# Patient Record
Sex: Female | Born: 1935 | Race: White | Hispanic: No | Smoking: Never smoker
Health system: Southern US, Community
[De-identification: ages and names within clinical notes are randomized; demographics above are authoritative.]

## PROBLEM LIST (undated history)

## (undated) ENCOUNTER — Emergency Department (HOSPITAL_COMMUNITY): Discharge status: Against Medical Advice | Payer: Medicare Other

## (undated) DIAGNOSIS — D472 Monoclonal gammopathy: Secondary | ICD-10-CM

## (undated) DIAGNOSIS — E042 Nontoxic multinodular goiter: Secondary | ICD-10-CM

## (undated) DIAGNOSIS — R42 Dizziness and giddiness: Secondary | ICD-10-CM

## (undated) DIAGNOSIS — I5030 Unspecified diastolic (congestive) heart failure: Secondary | ICD-10-CM

## (undated) DIAGNOSIS — M810 Age-related osteoporosis without current pathological fracture: Secondary | ICD-10-CM

## (undated) DIAGNOSIS — E119 Type 2 diabetes mellitus without complications: Secondary | ICD-10-CM

## (undated) DIAGNOSIS — D649 Anemia, unspecified: Secondary | ICD-10-CM

## (undated) DIAGNOSIS — I4891 Unspecified atrial fibrillation: Secondary | ICD-10-CM

## (undated) DIAGNOSIS — I10 Essential (primary) hypertension: Secondary | ICD-10-CM

## (undated) DIAGNOSIS — I272 Pulmonary hypertension, unspecified: Secondary | ICD-10-CM

## (undated) DIAGNOSIS — K5790 Diverticulosis of intestine, part unspecified, without perforation or abscess without bleeding: Secondary | ICD-10-CM

## (undated) DIAGNOSIS — L989 Disorder of the skin and subcutaneous tissue, unspecified: Secondary | ICD-10-CM

## (undated) DIAGNOSIS — I340 Nonrheumatic mitral (valve) insufficiency: Secondary | ICD-10-CM

## (undated) DIAGNOSIS — N184 Chronic kidney disease, stage 4 (severe): Secondary | ICD-10-CM

## (undated) DIAGNOSIS — J449 Chronic obstructive pulmonary disease, unspecified: Secondary | ICD-10-CM

## (undated) DIAGNOSIS — IMO0002 Reserved for concepts with insufficient information to code with codable children: Secondary | ICD-10-CM

## (undated) DIAGNOSIS — I350 Nonrheumatic aortic (valve) stenosis: Secondary | ICD-10-CM

## (undated) DIAGNOSIS — C50919 Malignant neoplasm of unspecified site of unspecified female breast: Secondary | ICD-10-CM

## (undated) DIAGNOSIS — I639 Cerebral infarction, unspecified: Secondary | ICD-10-CM

## (undated) HISTORY — PX: HEMORRHOID SURGERY: SHX153

## (undated) HISTORY — PX: OTHER SURGICAL HISTORY: SHX169

## (undated) HISTORY — DX: Malignant neoplasm of unspecified site of unspecified female breast: C50.919

## (undated) HISTORY — PX: COLONOSCOPY: SHX174

## (undated) HISTORY — PX: CHOLECYSTECTOMY: SHX55

## (undated) HISTORY — PX: CATARACT EXTRACTION, BILATERAL: SHX1313

---

## 1989-04-29 HISTORY — PX: ABDOMINAL HYSTERECTOMY: SHX81

## 2000-09-30 ENCOUNTER — Encounter: Payer: Self-pay | Admitting: Family Medicine

## 2000-09-30 ENCOUNTER — Ambulatory Visit (HOSPITAL_COMMUNITY): Admission: RE | Admit: 2000-09-30 | Discharge: 2000-09-30 | Payer: Self-pay | Admitting: Family Medicine

## 2001-01-06 ENCOUNTER — Encounter: Payer: Self-pay | Admitting: Emergency Medicine

## 2001-01-06 ENCOUNTER — Emergency Department (HOSPITAL_COMMUNITY): Admission: EM | Admit: 2001-01-06 | Discharge: 2001-01-06 | Payer: Self-pay | Admitting: Emergency Medicine

## 2001-02-12 ENCOUNTER — Encounter: Payer: Self-pay | Admitting: Family Medicine

## 2001-02-12 ENCOUNTER — Ambulatory Visit (HOSPITAL_COMMUNITY): Admission: RE | Admit: 2001-02-12 | Discharge: 2001-02-12 | Payer: Self-pay | Admitting: Family Medicine

## 2001-03-03 ENCOUNTER — Ambulatory Visit (HOSPITAL_COMMUNITY): Admission: RE | Admit: 2001-03-03 | Discharge: 2001-03-03 | Payer: Self-pay | Admitting: Ophthalmology

## 2001-07-30 ENCOUNTER — Ambulatory Visit (HOSPITAL_COMMUNITY): Admission: RE | Admit: 2001-07-30 | Discharge: 2001-07-30 | Payer: Self-pay | Admitting: Family Medicine

## 2001-07-30 ENCOUNTER — Encounter: Payer: Self-pay | Admitting: Family Medicine

## 2001-08-28 ENCOUNTER — Ambulatory Visit (HOSPITAL_COMMUNITY): Admission: RE | Admit: 2001-08-28 | Discharge: 2001-08-28 | Payer: Self-pay | Admitting: Family Medicine

## 2001-08-28 ENCOUNTER — Encounter: Payer: Self-pay | Admitting: Family Medicine

## 2001-08-31 ENCOUNTER — Ambulatory Visit (HOSPITAL_COMMUNITY): Admission: RE | Admit: 2001-08-31 | Discharge: 2001-08-31 | Payer: Self-pay | Admitting: Family Medicine

## 2001-09-10 ENCOUNTER — Ambulatory Visit (HOSPITAL_COMMUNITY): Admission: RE | Admit: 2001-09-10 | Discharge: 2001-09-10 | Payer: Self-pay | Admitting: Family Medicine

## 2001-11-27 ENCOUNTER — Encounter: Payer: Self-pay | Admitting: Emergency Medicine

## 2001-11-27 ENCOUNTER — Emergency Department (HOSPITAL_COMMUNITY): Admission: EM | Admit: 2001-11-27 | Discharge: 2001-11-27 | Payer: Self-pay | Admitting: Emergency Medicine

## 2002-03-15 ENCOUNTER — Ambulatory Visit (HOSPITAL_COMMUNITY): Admission: RE | Admit: 2002-03-15 | Discharge: 2002-03-15 | Payer: Self-pay | Admitting: Ophthalmology

## 2002-07-22 ENCOUNTER — Ambulatory Visit (HOSPITAL_COMMUNITY): Admission: RE | Admit: 2002-07-22 | Discharge: 2002-07-22 | Payer: Self-pay | Admitting: Ophthalmology

## 2003-03-25 ENCOUNTER — Ambulatory Visit (HOSPITAL_COMMUNITY): Admission: RE | Admit: 2003-03-25 | Discharge: 2003-03-25 | Payer: Self-pay | Admitting: Family Medicine

## 2003-04-30 HISTORY — PX: MASTECTOMY: SHX3

## 2003-08-17 ENCOUNTER — Ambulatory Visit (HOSPITAL_COMMUNITY): Admission: RE | Admit: 2003-08-17 | Discharge: 2003-08-17 | Payer: Self-pay | Admitting: Family Medicine

## 2003-08-19 ENCOUNTER — Ambulatory Visit (HOSPITAL_COMMUNITY): Admission: RE | Admit: 2003-08-19 | Discharge: 2003-08-19 | Payer: Self-pay | Admitting: General Surgery

## 2003-08-26 ENCOUNTER — Inpatient Hospital Stay (HOSPITAL_COMMUNITY): Admission: RE | Admit: 2003-08-26 | Discharge: 2003-08-29 | Payer: Self-pay | Admitting: General Surgery

## 2003-09-02 ENCOUNTER — Encounter: Admission: RE | Admit: 2003-09-02 | Discharge: 2003-09-02 | Payer: Self-pay | Admitting: Oncology

## 2003-09-02 ENCOUNTER — Encounter (HOSPITAL_COMMUNITY): Admission: RE | Admit: 2003-09-02 | Discharge: 2003-10-02 | Payer: Self-pay | Admitting: Oncology

## 2004-03-28 ENCOUNTER — Encounter (HOSPITAL_COMMUNITY): Admission: RE | Admit: 2004-03-28 | Discharge: 2004-04-27 | Payer: Self-pay | Admitting: Oncology

## 2004-03-28 ENCOUNTER — Ambulatory Visit (HOSPITAL_COMMUNITY): Payer: Self-pay | Admitting: Oncology

## 2004-03-28 ENCOUNTER — Encounter: Admission: RE | Admit: 2004-03-28 | Discharge: 2004-04-27 | Payer: Self-pay | Admitting: Oncology

## 2004-04-02 ENCOUNTER — Ambulatory Visit (HOSPITAL_COMMUNITY): Admission: RE | Admit: 2004-04-02 | Discharge: 2004-04-02 | Payer: Self-pay | Admitting: General Surgery

## 2004-04-29 HISTORY — PX: HIP FRACTURE SURGERY: SHX118

## 2004-09-21 ENCOUNTER — Ambulatory Visit (HOSPITAL_COMMUNITY): Payer: Self-pay | Admitting: Oncology

## 2004-09-21 ENCOUNTER — Encounter: Admission: RE | Admit: 2004-09-21 | Discharge: 2004-09-21 | Payer: Self-pay | Admitting: Oncology

## 2004-09-21 ENCOUNTER — Encounter (HOSPITAL_COMMUNITY): Admission: RE | Admit: 2004-09-21 | Discharge: 2004-10-21 | Payer: Self-pay | Admitting: Oncology

## 2004-12-02 ENCOUNTER — Inpatient Hospital Stay (HOSPITAL_COMMUNITY): Admission: EM | Admit: 2004-12-02 | Discharge: 2004-12-11 | Payer: Self-pay | Admitting: *Deleted

## 2004-12-03 ENCOUNTER — Encounter (INDEPENDENT_AMBULATORY_CARE_PROVIDER_SITE_OTHER): Payer: Self-pay | Admitting: Orthopaedic Surgery

## 2005-01-28 ENCOUNTER — Ambulatory Visit (HOSPITAL_COMMUNITY): Admission: RE | Admit: 2005-01-28 | Discharge: 2005-01-28 | Payer: Self-pay | Admitting: Family Medicine

## 2005-02-20 ENCOUNTER — Emergency Department (HOSPITAL_COMMUNITY): Admission: EM | Admit: 2005-02-20 | Discharge: 2005-02-20 | Payer: Self-pay | Admitting: Emergency Medicine

## 2005-03-25 ENCOUNTER — Ambulatory Visit (HOSPITAL_COMMUNITY): Payer: Self-pay | Admitting: Oncology

## 2005-03-25 ENCOUNTER — Encounter (HOSPITAL_COMMUNITY): Admission: RE | Admit: 2005-03-25 | Discharge: 2005-04-24 | Payer: Self-pay | Admitting: Oncology

## 2005-03-25 ENCOUNTER — Encounter: Admission: RE | Admit: 2005-03-25 | Discharge: 2005-03-25 | Payer: Self-pay | Admitting: Oncology

## 2005-04-01 ENCOUNTER — Ambulatory Visit (HOSPITAL_COMMUNITY): Admission: RE | Admit: 2005-04-01 | Discharge: 2005-04-01 | Payer: Self-pay | Admitting: Ophthalmology

## 2005-09-17 ENCOUNTER — Emergency Department (HOSPITAL_COMMUNITY): Admission: EM | Admit: 2005-09-17 | Discharge: 2005-09-17 | Payer: Self-pay | Admitting: Emergency Medicine

## 2005-10-14 ENCOUNTER — Ambulatory Visit (HOSPITAL_COMMUNITY): Admission: RE | Admit: 2005-10-14 | Discharge: 2005-10-14 | Payer: Self-pay | Admitting: Family Medicine

## 2006-03-07 ENCOUNTER — Ambulatory Visit (HOSPITAL_COMMUNITY): Admission: RE | Admit: 2006-03-07 | Discharge: 2006-03-07 | Payer: Self-pay | Admitting: Family Medicine

## 2006-08-06 ENCOUNTER — Ambulatory Visit (HOSPITAL_COMMUNITY): Admission: RE | Admit: 2006-08-06 | Discharge: 2006-08-06 | Payer: Self-pay | Admitting: Family Medicine

## 2006-08-07 ENCOUNTER — Ambulatory Visit (HOSPITAL_COMMUNITY): Admission: RE | Admit: 2006-08-07 | Discharge: 2006-08-07 | Payer: Self-pay | Admitting: Family Medicine

## 2006-09-27 ENCOUNTER — Emergency Department (HOSPITAL_COMMUNITY): Admission: EM | Admit: 2006-09-27 | Discharge: 2006-09-27 | Payer: Self-pay | Admitting: Emergency Medicine

## 2006-10-07 ENCOUNTER — Ambulatory Visit (HOSPITAL_COMMUNITY): Admission: RE | Admit: 2006-10-07 | Discharge: 2006-10-07 | Payer: Self-pay | Admitting: Family Medicine

## 2007-08-13 ENCOUNTER — Ambulatory Visit (HOSPITAL_COMMUNITY): Admission: RE | Admit: 2007-08-13 | Discharge: 2007-08-13 | Payer: Self-pay | Admitting: Family Medicine

## 2007-12-20 ENCOUNTER — Emergency Department (HOSPITAL_COMMUNITY): Admission: EM | Admit: 2007-12-20 | Discharge: 2007-12-20 | Payer: Self-pay | Admitting: Emergency Medicine

## 2008-04-29 HISTORY — PX: HIP FRACTURE SURGERY: SHX118

## 2008-05-12 ENCOUNTER — Ambulatory Visit: Payer: Self-pay | Admitting: Cardiology

## 2008-05-12 ENCOUNTER — Inpatient Hospital Stay (HOSPITAL_COMMUNITY): Admission: EM | Admit: 2008-05-12 | Discharge: 2008-05-17 | Payer: Self-pay | Admitting: Emergency Medicine

## 2008-05-16 ENCOUNTER — Encounter (INDEPENDENT_AMBULATORY_CARE_PROVIDER_SITE_OTHER): Payer: Self-pay | Admitting: Internal Medicine

## 2008-05-17 ENCOUNTER — Inpatient Hospital Stay: Admission: RE | Admit: 2008-05-17 | Discharge: 2008-06-16 | Payer: Self-pay | Admitting: Internal Medicine

## 2008-06-13 ENCOUNTER — Ambulatory Visit (HOSPITAL_COMMUNITY): Admission: RE | Admit: 2008-06-13 | Discharge: 2008-06-13 | Payer: Self-pay | Admitting: Internal Medicine

## 2008-09-12 ENCOUNTER — Ambulatory Visit (HOSPITAL_COMMUNITY): Admission: RE | Admit: 2008-09-12 | Discharge: 2008-09-12 | Payer: Self-pay | Admitting: Ophthalmology

## 2008-11-09 ENCOUNTER — Ambulatory Visit (HOSPITAL_COMMUNITY): Admission: RE | Admit: 2008-11-09 | Discharge: 2008-11-09 | Payer: Self-pay | Admitting: Orthopaedic Surgery

## 2008-12-27 ENCOUNTER — Ambulatory Visit (HOSPITAL_COMMUNITY): Admission: RE | Admit: 2008-12-27 | Discharge: 2008-12-27 | Payer: Self-pay | Admitting: Family Medicine

## 2009-10-16 ENCOUNTER — Encounter (HOSPITAL_COMMUNITY): Admission: RE | Admit: 2009-10-16 | Discharge: 2009-11-15 | Payer: Self-pay | Admitting: Family Medicine

## 2009-11-16 ENCOUNTER — Encounter (HOSPITAL_COMMUNITY): Admission: RE | Admit: 2009-11-16 | Discharge: 2009-12-16 | Payer: Self-pay | Admitting: Family Medicine

## 2009-12-16 ENCOUNTER — Ambulatory Visit: Payer: Self-pay | Admitting: Cardiovascular Disease

## 2009-12-18 ENCOUNTER — Encounter (INDEPENDENT_AMBULATORY_CARE_PROVIDER_SITE_OTHER): Payer: Self-pay | Admitting: Internal Medicine

## 2010-01-24 ENCOUNTER — Inpatient Hospital Stay (HOSPITAL_COMMUNITY): Admission: EM | Admit: 2010-01-24 | Discharge: 2010-01-27 | Payer: Self-pay | Admitting: Emergency Medicine

## 2010-01-24 ENCOUNTER — Ambulatory Visit: Payer: Self-pay | Admitting: Cardiology

## 2010-04-05 ENCOUNTER — Inpatient Hospital Stay (HOSPITAL_COMMUNITY): Admission: EM | Admit: 2010-04-05 | Discharge: 2009-12-22 | Payer: Self-pay | Admitting: Emergency Medicine

## 2010-07-10 ENCOUNTER — Other Ambulatory Visit (HOSPITAL_COMMUNITY): Payer: Self-pay | Admitting: Internal Medicine

## 2010-07-10 DIAGNOSIS — Z139 Encounter for screening, unspecified: Secondary | ICD-10-CM

## 2010-07-11 LAB — GLUCOSE, CAPILLARY: Glucose-Capillary: 137 mg/dL — ABNORMAL HIGH (ref 70–99)

## 2010-07-11 LAB — DIFFERENTIAL
Basophils Relative: 0 % (ref 0–1)
Lymphocytes Relative: 11 % — ABNORMAL LOW (ref 12–46)
Lymphs Abs: 0.7 10*3/uL (ref 0.7–4.0)
Monocytes Absolute: 0.5 10*3/uL (ref 0.1–1.0)
Monocytes Relative: 8 % (ref 3–12)
Neutrophils Relative %: 79 % — ABNORMAL HIGH (ref 43–77)

## 2010-07-11 LAB — CBC
MCHC: 33.9 g/dL (ref 30.0–36.0)
MCV: 94.3 fL (ref 78.0–100.0)
Platelets: 236 10*3/uL (ref 150–400)
RDW: 14.8 % (ref 11.5–15.5)

## 2010-07-12 LAB — GLUCOSE, CAPILLARY
Glucose-Capillary: 104 mg/dL — ABNORMAL HIGH (ref 70–99)
Glucose-Capillary: 106 mg/dL — ABNORMAL HIGH (ref 70–99)
Glucose-Capillary: 119 mg/dL — ABNORMAL HIGH (ref 70–99)
Glucose-Capillary: 131 mg/dL — ABNORMAL HIGH (ref 70–99)
Glucose-Capillary: 144 mg/dL — ABNORMAL HIGH (ref 70–99)
Glucose-Capillary: 153 mg/dL — ABNORMAL HIGH (ref 70–99)
Glucose-Capillary: 101 mg/dL — ABNORMAL HIGH (ref 70–99)
Glucose-Capillary: 127 mg/dL — ABNORMAL HIGH (ref 70–99)
Glucose-Capillary: 138 mg/dL — ABNORMAL HIGH (ref 70–99)
Glucose-Capillary: 142 mg/dL — ABNORMAL HIGH (ref 70–99)
Glucose-Capillary: 152 mg/dL — ABNORMAL HIGH (ref 70–99)
Glucose-Capillary: 154 mg/dL — ABNORMAL HIGH (ref 70–99)
Glucose-Capillary: 163 mg/dL — ABNORMAL HIGH (ref 70–99)
Glucose-Capillary: 166 mg/dL — ABNORMAL HIGH (ref 70–99)
Glucose-Capillary: 171 mg/dL — ABNORMAL HIGH (ref 70–99)
Glucose-Capillary: 172 mg/dL — ABNORMAL HIGH (ref 70–99)
Glucose-Capillary: 203 mg/dL — ABNORMAL HIGH (ref 70–99)
Glucose-Capillary: 217 mg/dL — ABNORMAL HIGH (ref 70–99)
Glucose-Capillary: 253 mg/dL — ABNORMAL HIGH (ref 70–99)
Glucose-Capillary: 336 mg/dL — ABNORMAL HIGH (ref 70–99)
Glucose-Capillary: 98 mg/dL (ref 70–99)

## 2010-07-12 LAB — CBC
HCT: 32.6 % — ABNORMAL LOW (ref 36.0–46.0)
HCT: 29.7 % — ABNORMAL LOW (ref 36.0–46.0)
HCT: 31.1 % — ABNORMAL LOW (ref 36.0–46.0)
Hemoglobin: 11 g/dL — ABNORMAL LOW (ref 12.0–15.0)
Hemoglobin: 9.7 g/dL — ABNORMAL LOW (ref 12.0–15.0)
Hemoglobin: 9.9 g/dL — ABNORMAL LOW (ref 12.0–15.0)
Hemoglobin: 11 g/dL — ABNORMAL LOW (ref 12.0–15.0)
MCH: 33 pg (ref 26.0–34.0)
MCH: 33.3 pg (ref 26.0–34.0)
MCHC: 32.9 g/dL (ref 30.0–36.0)
MCHC: 33.2 g/dL (ref 30.0–36.0)
MCV: 95.3 fL (ref 78.0–100.0)
MCV: 95.4 fL (ref 78.0–100.0)
MCV: 96.1 fL (ref 78.0–100.0)
MCV: 100.6 fL — ABNORMAL HIGH (ref 78.0–100.0)
MCV: 100.8 fL — ABNORMAL HIGH (ref 78.0–100.0)
MCV: 99.4 fL (ref 78.0–100.0)
Platelets: 225 10*3/uL (ref 150–400)
Platelets: 230 10*3/uL (ref 150–400)
Platelets: 239 10*3/uL (ref 150–400)
Platelets: 299 10*3/uL (ref 150–400)
Platelets: 304 10*3/uL (ref 150–400)
RBC: 3.05 MIL/uL — ABNORMAL LOW (ref 3.87–5.11)
RBC: 3.1 MIL/uL — ABNORMAL LOW (ref 3.87–5.11)
RBC: 3.16 MIL/uL — ABNORMAL LOW (ref 3.87–5.11)
RBC: 3.43 MIL/uL — ABNORMAL LOW (ref 3.87–5.11)
RBC: 2.56 MIL/uL — ABNORMAL LOW (ref 3.87–5.11)
RBC: 2.73 MIL/uL — ABNORMAL LOW (ref 3.87–5.11)
RBC: 3.32 MIL/uL — ABNORMAL LOW (ref 3.87–5.11)
RDW: 14.3 % (ref 11.5–15.5)
RDW: 16.1 % — ABNORMAL HIGH (ref 11.5–15.5)
RDW: 16.5 % — ABNORMAL HIGH (ref 11.5–15.5)
RDW: 17.5 % — ABNORMAL HIGH (ref 11.5–15.5)
WBC: 4 10*3/uL (ref 4.0–10.5)
WBC: 4.5 10*3/uL (ref 4.0–10.5)
WBC: 5.1 10*3/uL (ref 4.0–10.5)
WBC: 5.1 10*3/uL (ref 4.0–10.5)
WBC: 4.5 10*3/uL (ref 4.0–10.5)
WBC: 4.9 10*3/uL (ref 4.0–10.5)
WBC: 5.1 10*3/uL (ref 4.0–10.5)

## 2010-07-12 LAB — DIFFERENTIAL
Basophils Absolute: 0 10*3/uL (ref 0.0–0.1)
Basophils Absolute: 0 10*3/uL (ref 0.0–0.1)
Basophils Absolute: 0 10*3/uL (ref 0.0–0.1)
Basophils Absolute: 0 10*3/uL (ref 0.0–0.1)
Basophils Relative: 0 % (ref 0–1)
Eosinophils Absolute: 0 10*3/uL (ref 0.0–0.7)
Eosinophils Absolute: 0 10*3/uL (ref 0.0–0.7)
Eosinophils Relative: 5 % (ref 0–5)
Eosinophils Relative: 6 % — ABNORMAL HIGH (ref 0–5)
Eosinophils Relative: 6 % — ABNORMAL HIGH (ref 0–5)
Eosinophils Relative: 4 % (ref 0–5)
Lymphocytes Relative: 12 % (ref 12–46)
Lymphocytes Relative: 18 % (ref 12–46)
Lymphocytes Relative: 23 % (ref 12–46)
Lymphocytes Relative: 23 % (ref 12–46)
Lymphocytes Relative: 15 % (ref 12–46)
Lymphocytes Relative: 8 % — ABNORMAL LOW (ref 12–46)
Lymphs Abs: 0.6 10*3/uL — ABNORMAL LOW (ref 0.7–4.0)
Lymphs Abs: 0.9 10*3/uL (ref 0.7–4.0)
Lymphs Abs: 0.9 10*3/uL (ref 0.7–4.0)
Lymphs Abs: 1 10*3/uL (ref 0.7–4.0)
Lymphs Abs: 0.4 10*3/uL — ABNORMAL LOW (ref 0.7–4.0)
Monocytes Absolute: 0.5 10*3/uL (ref 0.1–1.0)
Monocytes Absolute: 0.1 10*3/uL (ref 0.1–1.0)
Monocytes Absolute: 0.5 10*3/uL (ref 0.1–1.0)
Monocytes Relative: 11 % (ref 3–12)
Neutro Abs: 2.6 10*3/uL (ref 1.7–7.7)
Neutro Abs: 3.3 10*3/uL (ref 1.7–7.7)
Neutro Abs: 5.4 10*3/uL (ref 1.7–7.7)
Neutrophils Relative %: 59 % (ref 43–77)
Neutrophils Relative %: 74 % (ref 43–77)
Neutrophils Relative %: 81 % — ABNORMAL HIGH (ref 43–77)
Neutrophils Relative %: 95 % — ABNORMAL HIGH (ref 43–77)

## 2010-07-12 LAB — CULTURE, BLOOD (ROUTINE X 2)
Culture: NO GROWTH
Culture: NO GROWTH

## 2010-07-12 LAB — CARDIAC PANEL(CRET KIN+CKTOT+MB+TROPI)
CK, MB: 0.8 ng/mL (ref 0.3–4.0)
CK, MB: 0.8 ng/mL (ref 0.3–4.0)
CK, MB: 0.9 ng/mL (ref 0.3–4.0)
Relative Index: INVALID (ref 0.0–2.5)
Relative Index: INVALID (ref 0.0–2.5)
Total CK: 28 U/L (ref 7–177)
Total CK: 29 U/L (ref 7–177)
Total CK: 25 U/L (ref 7–177)
Total CK: 27 U/L (ref 7–177)
Troponin I: 0.01 ng/mL (ref 0.00–0.06)
Troponin I: 0.03 ng/mL (ref 0.00–0.06)

## 2010-07-12 LAB — BASIC METABOLIC PANEL
BUN: 16 mg/dL (ref 6–23)
BUN: 9 mg/dL (ref 6–23)
BUN: 9 mg/dL (ref 6–23)
CO2: 28 mEq/L (ref 19–32)
CO2: 28 mEq/L (ref 19–32)
Calcium: 8 mg/dL — ABNORMAL LOW (ref 8.4–10.5)
Calcium: 9 mg/dL (ref 8.4–10.5)
Chloride: 99 mEq/L (ref 96–112)
Chloride: 104 mEq/L (ref 96–112)
Chloride: 105 mEq/L (ref 96–112)
Chloride: 106 mEq/L (ref 96–112)
Chloride: 107 mEq/L (ref 96–112)
Chloride: 111 mEq/L (ref 96–112)
Creatinine, Ser: 0.63 mg/dL (ref 0.4–1.2)
Creatinine, Ser: 0.72 mg/dL (ref 0.4–1.2)
Creatinine, Ser: 0.74 mg/dL (ref 0.4–1.2)
Creatinine, Ser: 0.87 mg/dL (ref 0.4–1.2)
GFR calc Af Amer: >60 mL/min (ref >60)
GFR calc Af Amer: >60 mL/min (ref >60)
GFR calc Af Amer: >60 mL/min (ref >60)
GFR calc Af Amer: >60 mL/min (ref >60)
GFR calc Af Amer: >60 mL/min (ref >60)
GFR calc Af Amer: >60 mL/min (ref >60)
GFR calc non Af Amer: 50 mL/min — ABNORMAL LOW (ref >60)
GFR calc non Af Amer: >60 mL/min (ref >60)
GFR calc non Af Amer: >60 mL/min (ref >60)
GFR calc non Af Amer: >60 mL/min (ref >60)
Glucose, Bld: 110 mg/dL — ABNORMAL HIGH (ref 70–99)
Glucose, Bld: 147 mg/dL — ABNORMAL HIGH (ref 70–99)
Potassium: 3.7 mEq/L (ref 3.5–5.1)
Potassium: 3.7 mEq/L (ref 3.5–5.1)
Potassium: 3.9 mEq/L (ref 3.5–5.1)
Potassium: 4 mEq/L (ref 3.5–5.1)
Sodium: 140 mEq/L (ref 135–145)
Sodium: 138 mEq/L (ref 135–145)
Sodium: 140 mEq/L (ref 135–145)

## 2010-07-12 LAB — URINALYSIS, ROUTINE W REFLEX MICROSCOPIC
Bilirubin Urine: NEGATIVE
Ketones, ur: NEGATIVE mg/dL
Nitrite: NEGATIVE
Specific Gravity, Urine: <1.005 — ABNORMAL LOW (ref 1.005–1.030)
Urobilinogen, UA: 0.2 mg/dL (ref 0.0–1.0)
pH: 6 (ref 5.0–8.0)

## 2010-07-12 LAB — BLOOD GAS, ARTERIAL
Acid-Base Excess: 0.7 mmol/L (ref 0.0–2.0)
Acid-Base Excess: 2.3 mmol/L — ABNORMAL HIGH (ref 0.0–2.0)
Bicarbonate: 24.6 mEq/L — ABNORMAL HIGH (ref 20.0–24.0)
Bicarbonate: 26 mEq/L — ABNORMAL HIGH (ref 20.0–24.0)
FIO2: 21 %
FIO2: 100 %
O2 Saturation: 93 %
Patient temperature: 37
Patient temperature: 37
TCO2: 22.6 mmol/L (ref 0–100)
TCO2: 23.4 mmol/L (ref 0–100)
pCO2 arterial: 46.1 mmHg — ABNORMAL HIGH (ref 35.0–45.0)
pCO2 arterial: 37.8 mmHg (ref 35.0–45.0)
pCO2 arterial: 42.4 mmHg (ref 35.0–45.0)
pH, Arterial: 7.404 — ABNORMAL HIGH (ref 7.350–7.400)
pH, Arterial: 7.382 (ref 7.350–7.400)
pH, Arterial: 7.452 — ABNORMAL HIGH (ref 7.350–7.400)
pO2, Arterial: 52 mmHg — ABNORMAL LOW (ref 80.0–100.0)
pO2, Arterial: 50.1 mmHg — ABNORMAL LOW (ref 80.0–100.0)

## 2010-07-12 LAB — POCT CARDIAC MARKERS
CKMB, poc: <1 ng/mL — ABNORMAL LOW (ref 1.0–8.0)
Troponin i, poc: <0.05 ng/mL (ref 0.00–0.09)

## 2010-07-12 LAB — STOOL CULTURE

## 2010-07-12 LAB — HEPATIC FUNCTION PANEL
ALT: 11 U/L (ref 0–35)
Alkaline Phosphatase: 52 U/L (ref 39–117)
Bilirubin, Direct: 0.1 mg/dL (ref 0.0–0.3)
Indirect Bilirubin: 0.6 mg/dL (ref 0.3–0.9)

## 2010-07-12 LAB — TSH: TSH: 1.041 u[IU]/mL (ref 0.350–4.500)

## 2010-07-12 LAB — MAGNESIUM: Magnesium: 1.6 mg/dL (ref 1.5–2.5)

## 2010-07-12 LAB — HEPARIN LEVEL (UNFRACTIONATED)
Heparin Unfractionated: <0.1 IU/mL — ABNORMAL LOW (ref 0.30–0.70)
Heparin Unfractionated: <0.1 IU/mL — ABNORMAL LOW (ref 0.30–0.70)

## 2010-07-12 LAB — CLOSTRIDIUM DIFFICILE EIA

## 2010-07-12 LAB — D-DIMER, QUANTITATIVE: D-Dimer, Quant: 0.53 ug/mL-FEU — ABNORMAL HIGH (ref 0.00–0.48)

## 2010-07-12 LAB — VANCOMYCIN, TROUGH: Vancomycin Tr: <5 ug/mL — ABNORMAL LOW (ref 10.0–20.0)

## 2010-07-12 LAB — PROTIME-INR
INR: 1.06 (ref 0.00–1.49)
Prothrombin Time: 14 seconds (ref 11.6–15.2)

## 2010-07-12 LAB — HEMOCCULT GUIAC POC 1CARD (OFFICE): Fecal Occult Bld: NEGATIVE

## 2010-07-12 LAB — BRAIN NATRIURETIC PEPTIDE: Pro B Natriuretic peptide (BNP): 285 pg/mL — ABNORMAL HIGH (ref 0.0–100.0)

## 2010-07-12 LAB — URINE MICROSCOPIC-ADD ON

## 2010-07-17 ENCOUNTER — Ambulatory Visit (HOSPITAL_COMMUNITY)
Admission: RE | Admit: 2010-07-17 | Discharge: 2010-07-17 | Disposition: A | Discharge status: Home / Self Care | Payer: Medicare Other | Source: Ambulatory Visit | Attending: Internal Medicine | Admitting: Internal Medicine

## 2010-07-17 DIAGNOSIS — Z139 Encounter for screening, unspecified: Secondary | ICD-10-CM

## 2010-07-17 DIAGNOSIS — Z1231 Encounter for screening mammogram for malignant neoplasm of breast: Secondary | ICD-10-CM | POA: Insufficient documentation

## 2010-08-13 LAB — CBC
HCT: 30.8 % — ABNORMAL LOW (ref 36.0–46.0)
HCT: 34.1 % — ABNORMAL LOW (ref 36.0–46.0)
HCT: 40 % (ref 36.0–46.0)
HCT: 28.7 % — ABNORMAL LOW (ref 36.0–46.0)
HCT: 29 % — ABNORMAL LOW (ref 36.0–46.0)
Hemoglobin: 10.4 g/dL — ABNORMAL LOW (ref 12.0–15.0)
Hemoglobin: 11.3 g/dL — ABNORMAL LOW (ref 12.0–15.0)
Hemoglobin: 13.4 g/dL (ref 12.0–15.0)
Hemoglobin: 9.5 g/dL — ABNORMAL LOW (ref 12.0–15.0)
MCHC: 33.5 g/dL (ref 30.0–36.0)
MCHC: 33.6 g/dL (ref 30.0–36.0)
MCHC: 35 g/dL (ref 30.0–36.0)
MCV: 96.2 fL (ref 78.0–100.0)
MCV: 92.3 fL (ref 78.0–100.0)
MCV: 92.8 fL (ref 78.0–100.0)
MCV: 96.7 fL (ref 78.0–100.0)
Platelets: 164 10*3/uL (ref 150–400)
Platelets: 177 10*3/uL (ref 150–400)
Platelets: 151 10*3/uL (ref 150–400)
Platelets: 176 10*3/uL (ref 150–400)
RBC: 3.21 MIL/uL — ABNORMAL LOW (ref 3.87–5.11)
RBC: 4.19 MIL/uL (ref 3.87–5.11)
RBC: 3.07 MIL/uL — ABNORMAL LOW (ref 3.87–5.11)
RBC: 3.09 MIL/uL — ABNORMAL LOW (ref 3.87–5.11)
RDW: 13.2 % (ref 11.5–15.5)
RDW: 13 % (ref 11.5–15.5)
RDW: 15.2 % (ref 11.5–15.5)
RDW: 15.8 % — ABNORMAL HIGH (ref 11.5–15.5)
WBC: 9.5 10*3/uL (ref 4.0–10.5)
WBC: 10.4 10*3/uL (ref 4.0–10.5)
WBC: 5.1 10*3/uL (ref 4.0–10.5)
WBC: 5.7 10*3/uL (ref 4.0–10.5)

## 2010-08-13 LAB — DIFFERENTIAL
Basophils Absolute: 0 10*3/uL (ref 0.0–0.1)
Basophils Absolute: 0 10*3/uL (ref 0.0–0.1)
Basophils Absolute: 0 10*3/uL (ref 0.0–0.1)
Basophils Absolute: 0 10*3/uL (ref 0.0–0.1)
Basophils Relative: 0 % (ref 0–1)
Basophils Relative: 0 % (ref 0–1)
Basophils Relative: 0 % (ref 0–1)
Basophils Relative: 0 % (ref 0–1)
Eosinophils Absolute: 0.1 10*3/uL (ref 0.0–0.7)
Eosinophils Absolute: 0.4 10*3/uL (ref 0.0–0.7)
Eosinophils Absolute: 0.1 10*3/uL (ref 0.0–0.7)
Eosinophils Absolute: 0.4 10*3/uL (ref 0.0–0.7)
Eosinophils Absolute: 0.5 10*3/uL (ref 0.0–0.7)
Eosinophils Relative: 2 % (ref 0–5)
Eosinophils Relative: 4 % (ref 0–5)
Eosinophils Relative: 1 % (ref 0–5)
Eosinophils Relative: 5 % (ref 0–5)
Lymphocytes Relative: 10 % — ABNORMAL LOW (ref 12–46)
Lymphocytes Relative: 8 % — ABNORMAL LOW (ref 12–46)
Lymphocytes Relative: 7 % — ABNORMAL LOW (ref 12–46)
Lymphs Abs: 0.6 10*3/uL — ABNORMAL LOW (ref 0.7–4.0)
Lymphs Abs: 0.9 10*3/uL (ref 0.7–4.0)
Metamyelocytes Relative: 0 %
Monocytes Absolute: 0.1 10*3/uL (ref 0.1–1.0)
Monocytes Absolute: 0.6 10*3/uL (ref 0.1–1.0)
Monocytes Absolute: 0.7 10*3/uL (ref 0.1–1.0)
Monocytes Absolute: 0.7 10*3/uL (ref 0.1–1.0)
Monocytes Relative: 1 % — ABNORMAL LOW (ref 3–12)
Monocytes Relative: 12 % (ref 3–12)
Monocytes Relative: 13 % — ABNORMAL HIGH (ref 3–12)
Neutro Abs: 8.2 10*3/uL — ABNORMAL HIGH (ref 1.7–7.7)
Neutro Abs: 4.1 10*3/uL (ref 1.7–7.7)
Neutrophils Relative %: 83 % — ABNORMAL HIGH (ref 43–77)
Neutrophils Relative %: 69 % (ref 43–77)
nRBC: 0 /100 WBC

## 2010-08-13 LAB — GLUCOSE, CAPILLARY
Glucose-Capillary: 162 mg/dL — ABNORMAL HIGH (ref 70–99)
Glucose-Capillary: 179 mg/dL — ABNORMAL HIGH (ref 70–99)
Glucose-Capillary: 201 mg/dL — ABNORMAL HIGH (ref 70–99)
Glucose-Capillary: 225 mg/dL — ABNORMAL HIGH (ref 70–99)
Glucose-Capillary: 267 mg/dL — ABNORMAL HIGH (ref 70–99)
Glucose-Capillary: 118 mg/dL — ABNORMAL HIGH (ref 70–99)
Glucose-Capillary: 124 mg/dL — ABNORMAL HIGH (ref 70–99)
Glucose-Capillary: 141 mg/dL — ABNORMAL HIGH (ref 70–99)
Glucose-Capillary: 144 mg/dL — ABNORMAL HIGH (ref 70–99)
Glucose-Capillary: 148 mg/dL — ABNORMAL HIGH (ref 70–99)
Glucose-Capillary: 167 mg/dL — ABNORMAL HIGH (ref 70–99)
Glucose-Capillary: 172 mg/dL — ABNORMAL HIGH (ref 70–99)
Glucose-Capillary: 173 mg/dL — ABNORMAL HIGH (ref 70–99)
Glucose-Capillary: 174 mg/dL — ABNORMAL HIGH (ref 70–99)
Glucose-Capillary: 178 mg/dL — ABNORMAL HIGH (ref 70–99)
Glucose-Capillary: 180 mg/dL — ABNORMAL HIGH (ref 70–99)
Glucose-Capillary: 181 mg/dL — ABNORMAL HIGH (ref 70–99)
Glucose-Capillary: 192 mg/dL — ABNORMAL HIGH (ref 70–99)
Glucose-Capillary: 193 mg/dL — ABNORMAL HIGH (ref 70–99)
Glucose-Capillary: 194 mg/dL — ABNORMAL HIGH (ref 70–99)
Glucose-Capillary: 199 mg/dL — ABNORMAL HIGH (ref 70–99)
Glucose-Capillary: 231 mg/dL — ABNORMAL HIGH (ref 70–99)
Glucose-Capillary: 247 mg/dL — ABNORMAL HIGH (ref 70–99)
Glucose-Capillary: 254 mg/dL — ABNORMAL HIGH (ref 70–99)
Glucose-Capillary: 262 mg/dL — ABNORMAL HIGH (ref 70–99)

## 2010-08-13 LAB — BASIC METABOLIC PANEL
BUN: 11 mg/dL (ref 6–23)
BUN: 13 mg/dL (ref 6–23)
BUN: 11 mg/dL (ref 6–23)
BUN: 13 mg/dL (ref 6–23)
BUN: 18 mg/dL (ref 6–23)
CO2: 27 mEq/L (ref 19–32)
CO2: 27 mEq/L (ref 19–32)
Calcium: 7.7 mg/dL — ABNORMAL LOW (ref 8.4–10.5)
Chloride: 102 mEq/L (ref 96–112)
Chloride: 103 mEq/L (ref 96–112)
Chloride: 96 mEq/L (ref 96–112)
Chloride: 99 mEq/L (ref 96–112)
Creatinine, Ser: 0.61 mg/dL (ref 0.4–1.2)
GFR calc Af Amer: >60 mL/min (ref >60)
GFR calc Af Amer: >60 mL/min (ref >60)
GFR calc non Af Amer: >60 mL/min (ref >60)
GFR calc non Af Amer: >60 mL/min (ref >60)
GFR calc non Af Amer: >60 mL/min (ref >60)
GFR calc non Af Amer: >60 mL/min (ref >60)
Glucose, Bld: 245 mg/dL — ABNORMAL HIGH (ref 70–99)
Glucose, Bld: 298 mg/dL — ABNORMAL HIGH (ref 70–99)
Potassium: 3.3 mEq/L — ABNORMAL LOW (ref 3.5–5.1)
Potassium: 3.3 mEq/L — ABNORMAL LOW (ref 3.5–5.1)
Potassium: 3.5 mEq/L (ref 3.5–5.1)
Potassium: 3.7 mEq/L (ref 3.5–5.1)
Sodium: 135 mEq/L (ref 135–145)
Sodium: 137 mEq/L (ref 135–145)
Sodium: 132 mEq/L — ABNORMAL LOW (ref 135–145)

## 2010-08-13 LAB — COMPREHENSIVE METABOLIC PANEL
Albumin: 3.9 g/dL (ref 3.5–5.2)
Alkaline Phosphatase: 57 U/L (ref 39–117)
BUN: 15 mg/dL (ref 6–23)
Chloride: 102 mEq/L (ref 96–112)
Creatinine, Ser: 0.79 mg/dL (ref 0.4–1.2)
Glucose, Bld: 246 mg/dL — ABNORMAL HIGH (ref 70–99)
Total Bilirubin: 0.6 mg/dL (ref 0.3–1.2)
Total Protein: 6.6 g/dL (ref 6.0–8.3)

## 2010-08-13 LAB — CROSSMATCH

## 2010-08-13 LAB — TYPE AND SCREEN
ABO/RH(D): O POS
Antibody Screen: NEGATIVE

## 2010-08-13 LAB — BLOOD GAS, ARTERIAL
Acid-Base Excess: 1.8 mmol/L (ref 0.0–2.0)
Bicarbonate: 25.9 mEq/L — ABNORMAL HIGH (ref 20.0–24.0)
O2 Saturation: 72.2 %
Patient temperature: 37
TCO2: 23.1 mmol/L (ref 0–100)
pO2, Arterial: 41.6 mmHg — ABNORMAL LOW (ref 80.0–100.0)

## 2010-08-13 LAB — URINALYSIS, ROUTINE W REFLEX MICROSCOPIC
Bilirubin Urine: NEGATIVE
Glucose, UA: >1000 mg/dL — AB
Ketones, ur: 15 mg/dL — AB
pH: 5 (ref 5.0–8.0)

## 2010-08-13 LAB — URINE MICROSCOPIC-ADD ON

## 2010-08-13 LAB — ABO/RH: ABO/RH(D): O POS

## 2010-08-13 LAB — PREPARE RBC (CROSSMATCH)

## 2010-08-14 LAB — GLUCOSE, CAPILLARY
Glucose-Capillary: 100 mg/dL — ABNORMAL HIGH (ref 70–99)
Glucose-Capillary: 120 mg/dL — ABNORMAL HIGH (ref 70–99)
Glucose-Capillary: 122 mg/dL — ABNORMAL HIGH (ref 70–99)
Glucose-Capillary: 122 mg/dL — ABNORMAL HIGH (ref 70–99)
Glucose-Capillary: 123 mg/dL — ABNORMAL HIGH (ref 70–99)
Glucose-Capillary: 135 mg/dL — ABNORMAL HIGH (ref 70–99)
Glucose-Capillary: 137 mg/dL — ABNORMAL HIGH (ref 70–99)
Glucose-Capillary: 168 mg/dL — ABNORMAL HIGH (ref 70–99)
Glucose-Capillary: 173 mg/dL — ABNORMAL HIGH (ref 70–99)

## 2010-09-11 NOTE — Op Note (Signed)
NAME:  Catherine [name], Catherine [name]                   ACCOUNT NO.:  [id number]   MEDICAL RECORD NO.:  [id number]          PATIENT TYPE:  INP   LOCATION:  A341                          FACILITY:  [facility]   PHYSICIAN:  J. Darreld Mclean, M.D. DATE OF BIRTH:  1923/10/01   DATE OF PROCEDURE:  DATE OF DISCHARGE:                               OPERATIVE REPORT   __________   ANESTHESIA:  Spinal.   SURGEON:  J. Darreld Mclean, MD   __________   ESTIMATED BLOOD LOSS:  200 mL.   __________   INDICATIONS:  The patient was taken to the operating room __________   The patient was seen in the holding area.  The right hip was identified  as the correct surgical site.  I placed a mark on the right hip.  She  was brought to the operating room and spinal anesthesia was given,  placed in the right side up, left side down decubitus position on  support.  She was prepped and draped in the usual manner.  Generalized  time-out __________ identified the right hip as the correct surgical  site.   Posterior approach was made to the hip after appropriate prep and drape.  __________.  Hip capsule identified, opened and hematoma expressed,  culture obtained.  Femoral head removed with a corkscrew, measured 47  mm.  The femoral neck was prepared, was reamed to a size 8 and rasped to  a size 8.  Femoral neck was cut appropriately and planar was used.  Trial reduction was carried out with a #8 stem, 47 head and a zero neck,  fit very well and leg lengths were very good.  Trial was removed,  permanent prosthesis was inserted, #8 stem, zero neck and 47 acetabulum  and it very nicely.  Short external rotators were reapproximated  __________ Hemovac drain was placed with a running 2-0 silk.  Fascial  layers were reapproximated using interrupted figure-of-eight #1 Surgilon  suture.  Subcutaneous was closed in layers using 2-0 plain and skin was  reapproximated with skin staples.  Sterile dressing was applied.  Abduction pillow  placed below the knees.  The patient tolerated the  procedure well and went to recovery in good condition.  Physical therapy  will begin tomorrow.  She is followed by the hospitalist.           ______________________________  [name]. Darreld Mclean, M.D.    JWK/MEDQ  D:  05/13/2008  T:  05/14/2008  Job:  [id number]

## 2010-09-11 NOTE — Consult Note (Signed)
NAME:  Catherine [name], DEVOS                   ACCOUNT NO.:  [id number]   MEDICAL RECORD NO.:  [id number]          PATIENT TYPE:  INP   LOCATION:  A341                          FACILITY:  [facility]   PHYSICIAN:  Edward L. Juanetta Gosling, M.D.DATE OF BIRTH:  Jul 02, 1923   DATE OF CONSULTATION:  DATE OF DISCHARGE:                                 CONSULTATION   Catherine [name] is an 77 year old who is in the hospital because of a fractured  hip.  While she was here, she was noted to be hypoxic and then it was  noted that she had had an echocardiogram that showed pulmonary  hypertension.  She does say that she has had problems being too sleepy  and it is not clear what she has maybe sleep apnea.  She has not had any  history of any lung disease as far as she knows.  Although, she says at  one point she was told that she might have asthma.  She has not had a  cough, but she is short of breath with minimal exertion.   PAST MEDICAL HISTORY:  Positive for diabetes mellitus, hypertension,  hyperlipidemia, breast cancer, and hypertension.  Apparently, she has  had some small strokes/TIAs.  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   MEDICATIONS AT HOME:  1. Simvastatin 40 mg daily.  2. Femara 25 mg daily.  3. Actos plus met 850/50 one b.i.d.  4. Aspirin 81 mg daily.  5. Citalopram 40 mg half a tablet daily.  6. Os-Cal with D twice a day.   ALLERGIES:  She has no known drug allergies.   Here in the hospital, she is on albuterol nebulizer treatments, Norvasc  5 mg daily, still on the Celexa.  She is on Lovenox, Dilaudid for pain,  insulin per protocol, Levaquin 500 mg every 24 hours, metformin, Actos,  simvastatin, and Benadryl.   SOCIAL HISTORY:  She is lifelong nonsmoker.  She does not use any  alcohol and has been independent.   She does not know of any history of lung disease in her family.   REVIEW OF SYSTEMS:  Except as mentioned is negative.   PHYSICAL EXAMINATION:   GENERAL:  She is hard of hearing.  Otherwise, her  HEENT is unremarkable.  She is awake and alert, mildly anxious.  VITAL SIGNS:  Blood pressure 128/80, pulse is in the 80s, and her O2  sats in the 90s on 2 L.  HEENT:  Her pupils are reactive.  Nose and throat are clear.  Mucous  membranes are moist.  CHEST:  Actually clear without wheezing.  HEART:  Regular without murmur, gallop, or rub.  ABDOMEN:  Soft with no masses.  EXTREMITIES:  She has had a hip surgery.  CNS:  Grossly intact.   LABORATORY DATA:  White blood count is 5700, hemoglobin is 9.5, and  platelets 156.  BMET, sodium is 132 and glucose of 227.  She had a blood  gas done on May 13, 2008, that showed pH 7.41, PCO2 42, and PO2  41.6.  Chest x-ray shows possible atelectasis this on May 13, 2008.  As mentioned, she has had an echocardiogram done in October 2006 did  show normal left ventricular systolic function, mild-to-moderate  asymmetric left ventricular hypertrophy, tricuspid regurgitation with  elevated pulmonary artery pressures.  She does give the history that her  husband said that she snored.  She has since then widowed.   ASSESSMENT:  Then, she has pulmonary hypertension, which may be primary  and she very well may need treatment.  The first treatment of course is  oxygen that she is already on, so that should help.  She probably needs  a sleep study to rule out sleep apnea, but I do not think she needs any  another testing done.  We can put her on Revatio, which I am going to  start and she is going to need home oxygen.      Edward L. Juanetta Gosling, M.D.  Electronically Signed     ELH/MEDQ  D:  05/16/2008  T:  05/16/2008  Job:  [id number]

## 2010-09-11 NOTE — Group Therapy Note (Signed)
NAME:  Catherine [name], Catherine [name]                   ACCOUNT NO.:  [id number]   MEDICAL Catherine [name] NO.:  [id number]          PATIENT TYPE:  INP   LOCATION:  A341                          FACILITY:  [facility]   PHYSICIAN:  Margaretmary Dys, M.D.DATE OF BIRTH:  Apr 02, 1924   DATE OF PROCEDURE:  05/15/2008  DATE OF DISCHARGE:                                 PROGRESS NOTE   SUBJECTIVE:  The patient feels somewhat better today.  The patient had  some difficulty getting out of bed during physical therapy, but she did  manage after some pain medication was given to her.  She denies any  chest pain at this time.  Her breathing is also improved.   OBJECTIVE:  GENERAL:  Conscious, alert, comfortable, not in acute  distress.  Well oriented to time, place and person.  VITAL SIGNS:  Blood pressure is 124/54 with a pulse of 106, respirations  22, temperature 98.3 degrees Fahrenheit. Oxygen saturation is 95% on 4  L.  HEENT:  Normocephalic, atraumatic.  Oral mucosa is moist with no  exudates.  NECK:  Supple.  No JVD or lymphadenopathy.  LUNGS:  Reduced air entry bilaterally.  Occasional crackles at the  bases.  HEART:  S1-S2. No S3, S4, gallops or rubs.  ABDOMEN: Soft, nontender.  Bowel sounds positive.  No masses palpable.  EXTREMITIES:  The patient is status post surgery on the right hip.  CNS:  Exam is grossly intact with no focal neurological deficits.   LABORATORY/DIAGNOSTIC DATA:  White blood cell count was 10.4, hemoglobin  10.1, hematocrit 28.7, platelet count was 128 with 74% neutrophils.  Blood sugars ranged between 176 and 235.  Sodium 132, potassium 3.7,  chloride of 96, CO2 was 27, glucose 242, BUN of 18, creatinine was 0.72,  calcium 7.8.   ASSESSMENT/PLAN:  1. Status post of fractured right hip, status post open reduction and      internal fixation under spinal anesthesia by Dr. Hilda Lias.  2. Diabetes mellitus, not well controlled.  3. Incidental finding of hypoxia requiring oxygen therapy.  The  patient has pulmonary hypertension based on echocardiogram back in      2006.  We will repeat another echocardiogram tomorrow.  4. History of multiple strokes in the past.  5. History of dyslipidemia.   PLAN:  1. Will continue all the patient's home medications.  2. Will continue sliding-scale insulin to better control her blood      sugars.  3. Will wait pulmonary input regarding the patient's hypoxemia and if      any additional testing is needed to be done      for her for pulmonary hypertension.  4. The patient is awaiting an echocardiogram in the morning.  5. Will continue current plan as per orthopedic surgery.      Margaretmary Dys, M.D.  Electronically Signed     AM/MEDQ  D:  05/15/2008  T:  05/15/2008  Job:  [id number]

## 2010-09-11 NOTE — Group Therapy Note (Signed)
NAME:  Catherine [name], ROACH                   ACCOUNT NO.:  [id number]   MEDICAL RECORD NO.:  [id number]          PATIENT TYPE:  INP   LOCATION:  A341                          FACILITY:  [facility]   PHYSICIAN:  Margaretmary Dys, M.D.DATE OF BIRTH:  [birth year]   DATE OF PROCEDURE:  05/14/2008  DATE OF DISCHARGE:                                 PROGRESS NOTE   SUBJECTIVE:  Patient had surgery yesterday which was uneventful.  There  were no complications.  Patient was noted to be hypoxic.  I have  reviewed patient's previous records and had seen the patient had a  history of pulmonary hypertension back on an echocardiogram that was  done on January 28, 2005.  It was reported at the time that the estimated  pulmonary arterial pressure was about 40 mmHg.  The exact etiology was  not identified.  It appears that patient's pulmonary hypertension may  have evolved.  Patient was hypoxic overnight with a blood gas but  quickly improved with oxygenation.  Further history from the family  suggested that patient has been having increasing shortness of breath  and was fairly lethargic and seems to be sleeping all the time possibly  suggesting some chronic hypoxemia.   OBJECTIVE:  Patient was conscious, alert, pleasant, not in acute  distress, well oriented in time, place, and person.  VITAL SIGNS:  Blood pressure is 123/60 with a pulse of 99.  Respirations  18.  Temperature 97.2 degrees Fahrenheit.  Oxygen saturation is 94% on  3.5 L.  HEENT EXAM:  Normocephalic, atraumatic.  Oral mucosa was moist with no  exudates.  NECK:  Supple.  No JVD or lymphadenopathy.  LUNGS:  Reduced air entry bilaterally.  No crackles or wheezing were  heard.  HEART:  S1 and S2.  No S3, S4, gallops, or rubs.  ABDOMEN:  Soft, nontender.  Bowel sounds positive.  EXTREMITIES:  Patient is status post right hip surgery.  Patient was  actually sitting up on a chair and appeared to be tolerating fairly  well.  She did ambulate with a  walker earlier.  CNS EXAM:  Patient was awake, alert, following commands, was well  oriented in time, place, and person with no focal neurological deficits.   LABORATORY/DIAGNOSTIC DATA:  White blood cell count was 7.6, hemoglobin  of 9.6, hematocrit of 29, platelet count was 151.  Neutrophils were 80%.  Sodium is 132, potassium 3.5, chloride of 99, CO2 was 28, glucose 298,  BUN of 11, creatinine was 0.61, calcium 7.6.   ASSESSMENT/PLAN:  1. Accidental fall status post fractured right hip status post open      reduction and internal fixation yesterday under spinal anesthesia  2. Incidental finding of hypoxia requiring oxygen therapy.  Patient      probably has pulmonary hypertension, possibly primary pulmonary      hypertension as an exact etiology has yet to be identified.      Patient is awaiting an echocardiogram.  3. History of hypertension.  4. History of diabetes mellitus.  5. History of multiple small strokes in the past.  6. History of dyslipidemia.   PLAN:  1. We will restart patient's blood pressure medication at this time.  2. I will also restart her oral hypoglycemic agents.  3. We will await echocardiogram to further evaluate this pulmonary      hypertension.  4. Patient may require long-term oxygen therapy, especially on      activity.  5. I would defer to the pulmonologist which I have consulted Dr.      Juanetta Gosling if we need to start her on some kind of a treatment for      pulmonary hypertension.  6. Patient will likely need further evaluation.   H and H is stable.  No indication for blood transfusion at this time.      Margaretmary Dys, M.D.  Electronically Signed     AM/MEDQ  D:  05/14/2008  T:  05/14/2008  Job:  [id number]

## 2010-09-11 NOTE — Group Therapy Note (Signed)
NAME:  [name], [name]                   ACCOUNT NO.:  [id number]   MEDICAL RECORD NO.:  [id number]          PATIENT TYPE:  INP   LOCATION:  A341                          FACILITY:  [facility]   PHYSICIAN:  Edward L. Juanetta Gosling, M.D.DATE OF BIRTH:  09/28/1923   DATE OF PROCEDURE:  DATE OF DISCHARGE:  05/17/2008                                 PROGRESS NOTE   [name] is a patient of Dr. Hilda Lias.  She has pulmonary hypertension  possibly from hypoxia, but it is not totally clear what the primary  problem is.  She is better this morning, states that she is feeling  better.  Dr. Hilda Lias states that she has a room arranged at the [facility].  At this point, she will need to remain on O2, she will need to  remain on the Revatio 3 times a day, and she will need a sleep study at  some point.      Edward L. Juanetta Gosling, M.D.  Electronically Signed     ELH/MEDQ  D:  05/17/2008  T:  05/17/2008  Job:  [id number]

## 2010-09-11 NOTE — Consult Note (Signed)
NAME:  Catherine, [name]                   ACCOUNT NO.:  [id number]   MEDICAL RECORD NO.:  [id number]          PATIENT TYPE:  INP   LOCATION:  A341                          FACILITY:  [facility]   PHYSICIAN:  Margaretmary Dys, M.D.DATE OF BIRTH:  02/13/1924   DATE OF CONSULTATION:  05/13/2008  DATE OF DISCHARGE:                                 CONSULTATION   REFERRING PHYSICIAN:  Dr. Hilda Lias.   REASON FOR CONSULTATION:  Medical evaluation and clearance prior to  surgery.   HISTORY OF PRESENT ILLNESS:  The patient fell yesterday and fractured  her right hip.  She has a subcapital fracture of the right hip with a  slight impaction.  The patient had no syncope, she had no loss of  consciousness.  Initial evaluation in the emergency room showed no  evidence of acute cerebrovascular accident.  The patient reported not  losing consciousness, said she was trying to roll up a carpet and it  slipped under her.  She fell and there were people around her who called  911 immediately, and she was brought into the hospital.  She did not  spend any significant amount of time on the ground.  The patient  otherwise has been doing fairly well.  She had hip surgery about 4 years  ago by Dr. Hilda Lias.  The patient is now scheduled for surgery sometime  in the morning.   REVIEW OF SYSTEMS:  The patient has some pain, otherwise she denies any  chest pain.  The patient has some pain in her right hip.  She denies any  fever or chills.  No nausea, vomiting.  No recent weight loss.  No prior  syncopal attacks.  She otherwise has been fairly healthy.   PAST MEDICAL HISTORY:  1. Diabetes mellitus with good control on oral hypoglycemic agents.  2. Hypertension.  3. Dyslipidemia.  4. History of breast cancer, status post mastectomy.  5. History of hypertension.  6. Multiple small strokes.  7. Depression/anxiety.   MEDICATIONS:  1. Simvastatin 40 mg p.o. nightly.  2. Femara 25 mg p.o. daily.  3. ActoPlus/Met 50/850  one tablet p.o. b.i.d.  4. Aspirin 81 mg p.o. once a day.  5. Citalopram 40 mg 1/2 tablet every day.  6. Os-Cal 50 mg with vitamin D twice a day.   ALLERGIES:  NO KNOWN DRUG ALLERGIES.   SOCIAL HISTORY:  The patient lost her husband about two years ago.  She  is twice married.  She is a lifelong nonsmoker.  Does not drink alcohol.  The patient lives at home and is still independent of her activities of  daily living.  The patient lives in [place].  She has one son who is  15 years old.   FAMILY HISTORY:  Noncontributory.   PHYSICAL EXAMINATION:  GENERAL:  The patient was conscious, alert,  comfortable, pleasant, not in acute distress.  She was well oriented in  time, place and person.  VITAL SIGNS:  Blood pressure 147/70 with a pulse of 92, respirations 20,  temperature 98.1 degrees Fahrenheit.  Oxygen saturation was 100% on  2  liters.  HEENT:  Normocephalic, atraumatic.  Oral mucosa was dry.  No exudates  were noted.  NECK:  Supple.  No JVD or lymphadenopathy.  LUNGS:  Clear.  Clinically good air entry bilaterally.  HEART:  S1-S2 regular.  No S3, S4, gallops or rubs.  ABDOMEN:  Soft, nontender.  Bowel sounds positive.  No masses palpable.  EXTREMITIES:  The patient is status post fracture of the right hip.  The  patient's right leg is slightly shorter than the left.  The patient is  in traction.  CNS:  Grossly intact with no focal neurological deficits.   LABORATORY DATA:  White blood cell count was 9.9, hemoglobin 13.4,  hematocrit 40, platelet count was 234 with 83% neutrophils.  PT 12.4,  INR was 0.9, glucose 267.  Sodium 140, potassium 3.5, chloride 102, CO2  of 27.  BUN 15, creatinine 0.79.  AST 24, ALT 14.  Albumin 3.9, calcium  9.7.  Urinalysis was positive for glucose, blood and trace protein.  Urine microscopy was otherwise unremarkable except for 7-10 WBCs.   X-ray of the right hip shows shortening of the right femoral neck with  evidence of minimal impacted  subcapital femoral neck fracture.  Chest x-  ray showed no acute abnormalities.  CT of the head showed no evidence of  acute intracranial hemorrhage or infraction.  Cervical spine CAT scan  only shows degenerative joint disease.   ASSESSMENT/PLAN:  Catherine [name] is an 77 year old female with multiple medical  problems who suffered an accidental fall yesterday and has a fracture of  the right hip.  The patient is scheduled for surgery today.   The patient's 12-lead EKG was unremarkable.  Review of an  echocardiogram; the patient back in 2006, did show evidence of some  pulmonary hypertension, but this was not subsequently followed.  I have  requested a repeat echocardiogram, but I do not think this will preclude  [place] from going ahead with surgery.  The patient also had a previous  history back in 2003, of severely decreased DLCO.  It is unclear why  those tests were obtained back then.   PLAN:  1. The patient can proceed with surgery.  I think she does have some      slightly below-average risk for surgery at this time.  2. Will hold patient's oral hypoglycemic agents and put her only on      insulin at this time by sliding scale.  3. We will resume her other home medications, especially for her      breast cancer as her breast cancer is only less than 5 years now.  4. We will follow her closely here with Dr. Hilda Lias.   I would like to thank Dr. Hilda Lias for this consult.  The patient will  clearly need close follow up during her course and prior to discharge.      Margaretmary Dys, M.D.  Electronically Signed     AM/MEDQ  D:  05/13/2008  T:  05/13/2008  Job:  [id number]

## 2010-09-11 NOTE — H&P (Signed)
NAME:  Catherine [name], Catherine [name]                   ACCOUNT NO.:  [id number]   MEDICAL RECORD NO.:  [id number]          PATIENT TYPE:  INP   LOCATION:  A341                          FACILITY:  [facility]   PHYSICIAN:  J. Darreld Mclean, M.D. DATE OF BIRTH:  [birth year]   DATE OF ADMISSION:  05/12/2008  DATE OF DISCHARGE:  LH                              HISTORY & PHYSICAL   CHIEF COMPLAINT:  I fell and hurt my other hip.   Patient is an 77 year old female who fell at home and injured her right  hip earlier this afternoon.  X-rays show a subcapital fracture of the  right hip with slight impaction.  She hit her head when she fell.  They  have already done a CT of her neck and her head, and these are negative.  She did not lose consciousness.  She had a bipolar hip by me in December 03, 2004 and did well.   Patient has diabetes, oral control.  She has hypertension.  Dr. Phillips Odor  is her family doctor.  She has a history of hyperlipidemia.  History of  breast cancer.  History of hypertension.   She has no allergies.   She does not smoke.  She does not use alcohol.   She has a history of surgery of both cataracts, both eyes, gallbladder,  thyroid surgery.  Three hemorrhoidectomies.  Cholecystectomy.  Left  mastectomy in 2005.   CURRENT MEDICATIONS:  1. Simvastatin 40 mg p.o. every evening.  2. Femara 25 mg 1 daily.  3. Actoplus/Met 15/850 1 tablet 2 times a day.  4. Aspirin 81 mg daily by mouth.  5. Citalopram/HBR 40 mg 1/2 tablet every day.  6. Os-Cal 50 mg with vitamin D twice daily.   Patient is otherwise in good health.  She has had a history of small  strokes in the past.  She has no history of TB in her lung.  No history  of GI or GU problem.  She had a breast mastectomy several years ago and  has had no significant problems.   Patient lives in [place].  Family member is present with her.  She is  a widow.   PHYSICAL EXAMINATION:  She is alert, cooperative, oriented.  Complains  of pain in  her right hip.  She has good motion to the left hip.  NECK:  Supple.  HEENT:  Negative.  LUNGS:  Clear to P and A.  HEART:  Regular without murmur.  ABDOMEN:  Soft and nontender without masses.  Pain and tenderness to the right hip with very little shortening.  Painful to move the right hip.  CNS:  Intact.  SKIN:  Intact.   DIAGNOSES:  1. I reviewed the x-rays, and she has a fracture of the femoral neck,      somewhat impacted.  I am going to have x-rays repeated and checked      to see the positioning on the lateral.  She may be able to have in      situ pinning.  She may need a bipolar hip.  She had a bipolar hip      on the left.  She understands the procedure.  2. Diabetes mellitus.  3. Status post mastectomy of the left breast in 2005 without any      residual.  4. Status post thyroidectomy.  5. History of hypertension.  6. History of hyperlipidemia.   PLAN:  Have the hospitalist see her.  Consider surgery tomorrow, either  pinning or bipolar hip.  I talked to the patient and the family about  the risks and imponderables, including infection, possible need for  blood transfusion, the possibility of blood clots, which could cause  death, need for physical therapy postop and anesthesia.  I recommend  spinal anesthesia.  I have recommended that after surgery, she would be  on enoxaparin.  She had this before, and she understands.  I will get  her typed and crossed for blood tonight.  Repeat the labs in the  morning.  Put a buck traction on.  She will have a Foley catheter.  Give  her morphine PCA for pain control.                                            ______________________________  J. Darreld Mclean, M.D.     JWK/MEDQ  D:  05/12/2008  T:  05/13/2008  Job:  [id number]

## 2010-09-11 NOTE — Discharge Summary (Signed)
NAME:  [name], Catherine                   ACCOUNT NO.:  [id number]   MEDICAL RECORD NO.:  [id number]          PATIENT TYPE:  INP   LOCATION:  A341                          FACILITY:  [facility]   PHYSICIAN:  J. Darreld Mclean, M.D. DATE OF BIRTH:  Dec 23, 1923   DATE OF ADMISSION:  05/12/2008  DATE OF DISCHARGE:  LH                               DISCHARGE SUMMARY   DISCHARGE DIAGNOSIS:  Subcapital fracture of the right hip.   PROCEDURE PERFORMED:  Bipolar hip prosthesis to the right hip.   DISCHARGE STATUS:  Improved.   PROGNOSIS:  Good.   DISPOSITION:  Transfer to skilled nursing facility.   PHYSICAL THERAPY DIRECTIONS AND INSTRUCTIONS:  Weightbear as tolerated  with a walker.  Use abduction pillow between the knees while in bed.   Wound care, remove staples to the hip on May 26, 2008.  Steri-Strip  the wound.   See the patient in office in 1 month.  X-rays prior to coming.   OTHER DIAGNOSES:  1. Diabetes mellitus.  2. Status post mastectomy of the left breast.  3. Status post thyroidectomy.  4. Hypertension.  5. Hyperlipidemia.   DISCHARGE MEDICATIONS:  1. Aspirin 81 mg daily.  2. Citalopram 20 mg daily.  3. Femara 2.5 mg daily.  4. Os-Cal 500 plus D daily.  5. Simvastatin 40 mg every evening.  6. Enoxaparin 30 mg subcu daily for 3 weeks.  7. Norvasc 5 mg daily.  8. Benicar 40 mg daily.  9. Glucophage 850 mg b.i.d. with food.  10.Revatio 20 mg t.i.d.  11.Levaquin 500 mg daily for the next 6 days and stop.  12.Darvocet-N 100 one every 6 hours p.r.n. pain.   BRIEF HISTORY:  The patient fell, sustained the above-mentioned injury,  seen, and evaluated.  She was found to have medical problems, but yet  still be a candidate for surgery.  She underwent the above-mentioned  procedure on May 13, 2008, and tolerated it well.  First day, her  hemoglobin was 9.6 and blood sugar was elevated.  She was given 1 unit  of blood.  She was seen by therapy, made very slow progress at  first,  and throughout her stay.  She was seen by the hospitalist.  Hemoglobin  after transfusion on the first day was good.  Second day, the Hemovac  drain was removed.  The wound looked good.  Wound dressings changed.  Third postoperative day, her hemoglobin dropped to 9.5.  She got another  unit of blood, and today, discharge hemoglobin was 10.5.  Wound looks  good.  She has an abduction pillow between the knees.  She has made slow  progress with therapies.  She has got a previous fracture on the other  side.  She understands what needs to be done.  She just needs time at  the nursing home to get her strength back, and to get her ability to  ambulate better.  I will see her in office in 1 month.  She needs x-rays  of her hip prior to my seeing her.  If she has any  difficultly, any  problem, she is to let me know.                                            ______________________________  [name]. Darreld Mclean, M.D.     JWK/MEDQ  D:  05/17/2008  T:  05/17/2008  Job:  [id number]

## 2010-09-14 NOTE — Discharge Summary (Signed)
NAME:  Catherine [name], Catherine [name]                   ACCOUNT NO.:  [id number]   MEDICAL RECORD NO.:  [id number]          PATIENT TYPE:  INP   LOCATION:  A309                          FACILITY:  [facility]   PHYSICIAN:  J. Darreld Mclean, M.D. DATE OF BIRTH:  July 17, 1923   DATE OF ADMISSION:  12/02/2004  DATE OF DISCHARGE:  LH                                 DISCHARGE SUMMARY   DISCHARGE DIAGNOSIS:  Subcapital fracture of the hip on the left.   PROCEDURE PERFORMED:  Left monopole hip replacement.   OTHER DIAGNOSES:  Diabetes mellitus, oral controlled.  Status post  mastectomy of the left breast a year ago.  Status post thyroidectomy.   CONDITION:  Good.   DISPOSITION:  To skilled nursing facility.  I will see her back in the  office in one month.   WOUND CARE:  Remove staples from the hip this coming Friday.  8/18.  Steri-  Strip the wound.   PHYSICAL THERAPY:  Weight bear as tolerated.  Use walker.   DISCHARGE MEDICATIONS:  1.  Avandia 4 mg tablets 2 daily.  2. Glyburide 2.5 mg 1 p.o. t.i.d.  3. 81      mg aspirin, 1 p.o. daily.  4. Norvasc 5 mg p.o. daily.  5. Lotensin 10      mg p.o. daily.  6. Zetia 10 mg p.o. daily.  7. Zocor 20 mg p.o. daily.      8. Protonix 40 mg p.o. daily.  9. Darvocet-N 100 1 every 6 hours p.r.n.      pain.   BRIEF HISTORY:  The patient fell on the day of admission and sustained the  above mentioned injury.  She tolerated it well.  She is seen and evaluated  and taken to surgery that afternoon.  She had a bipolar hip.  Her first  postoperative day, she had low potassium at 2.8, and this was supplemented.  She was placed in a chair, and pain was well controlled.  Hemoglobin was  normal.  On the second postoperative day, she had temperature to 101.  Potassium was increased to 3.8. Hemoglobin was stable.  Hemovac was removed.  Physical therapy was begun.  Chest x-ray was ordered which returned as  negative.  Culture of the urine was done which returned as negative.  Hemoglobin is 9.8 on 8/10.  She complained of constipation.  She was given  treatment.  Therapy originally was very slow, but all of a sudden progressed  very nicely.  She was able to walk up and down the hall.  She needs  assistance  getting in and out of the bed.  Temperature slowly resolved.  Foley was  discontinued on 8/11.  The patient's hemoglobin has remained stable.  The  wound looks good.  Her plans are to go to skilled nursing facility.  See in  the office in a month as stated.  EKG was negative.       JWK/MEDQ  D:  12/10/2004  T:  12/10/2004  Job:  [id number]

## 2010-09-14 NOTE — H&P (Signed)
NAME:  Catherine [name], Catherine [name]                             ACCOUNT NO.:  [id number]   MEDICAL RECORD NO.:  [id number]                   PATIENT TYPE:  AMB   LOCATION:  DAY                                  FACILITY:  [facility]   PHYSICIAN:  Dalia Heading, M.D.               DATE OF BIRTH:  [birth year]   DATE OF ADMISSION:  DATE OF DISCHARGE:                                HISTORY & PHYSICAL   CHIEF COMPLAINT:  Left breast mass.   HISTORY AND PHYSICAL:  Patient is a 77 year old white female who is referred  for evaluation and treatment of a left breast mass.  This was noted by the  patient earlier this week.  No nipple discharge or family history of breast  carcinoma is noted.  She is status post a hysterectomy in the remote past  for cancer.   PAST MEDICAL HISTORY:  1. Hypertension.  2. Non-insulin-dependent diabetes mellitus.  3. Occasional anxiety.   PAST SURGICAL HISTORY:  1. Hysterectomy, as noted above.  2. Hemorrhoidectomy.  3. Cholecystectomy.  4. Right thyroid lobectomy.   CURRENT MEDICATIONS:  1. Glucovance.  2. Avandia.  3. Norvasc.  4. Celexa.  5. Diazepam p.r.n.   ALLERGIES:  No known drug allergies.   REVIEW OF SYSTEMS:  Patient denies drinking or smoking.  She denies any  recent chest pain, shortness of breath, swelling, CVA, or insulin-dependent  diabetes mellitus.   PHYSICAL EXAMINATION:  VITAL SIGNS:  She is afebrile.  Vital signs are  stable.  GENERAL:  Patient is a pleasant white female in no acute distress.  NECK:  Supple without lymphadenopathy.  LUNGS:  Clear to auscultation with equal breath sounds bilaterally.  HEART:  Regular rate and rhythm without S3, S4, or murmurs.  BREAST:  Right breast examination reveals a nondominant mass, nipple  discharge, or dimpling.  The axilla is negative for palpable nodes.  The  left breast examination reveals a dominant, indistinct mass noted at the 12  o'clock position with dimpling of the skin.  It is approximately 3 cm  in  size.  No nipple discharge is noted.  The axilla is negative for palpable  nodes.   Mammography is reportedly negative, although the ultrasound shows an  indistinct area of increased density.   IMPRESSION:  Left breast mass.   PLAN:  The patient is scheduled for a left breast biopsy on August 19, 2003.  The risks and benefits of the procedure, including bleeding and infection,  were fully explained to the patient, who gave informed consent.     ___________________________________________                                         Dalia Heading, M.D.   MAJ/MEDQ  D:  08/18/2003  T:  08/18/2003  Job:  [id number]   cc:   Corrie Mckusick, M.D.  [address] [place]. [place]  [place]  [place] [address]  Fax: [phone]

## 2010-09-14 NOTE — Procedures (Signed)
NAME:  Catherine [name], Catherine [name]                   ACCOUNT NO.:  [id number]   MEDICAL RECORD NO.:  [id number]          PATIENT TYPE:  OUT   LOCATION:  RAD                           FACILITY:  [facility]   PHYSICIAN:  Madaline Savage, M.D.DATE OF BIRTH:  12/11/1923   DATE OF PROCEDURE:  01/28/2005  DATE OF DISCHARGE:                                  ECHOCARDIOGRAM   INDICATION FOR PROCEDURE:  Recent near syncope.   CLINICAL INFORMATION:  The patient has hypertension and diabetes mellitus.  The patient had a previous echocardiogram dated May 2003.  Technically this  study was adequate for interpretation.   RESULTS:  1.  Aortic valve:  The aortic valve is trileaflet without any evidence of      aortic stenosis and there is only a trace of aortic regurgitation.  2.  Mitral valve:  The mitral valve contains mild mitral annular      calcification.  The valve is structurally normal.  There is no evidence      of stenosis.  There is a mild degree of mitral regurgitation which is      directed posteriorly.  3.  Tricuspid valve:  The tricuspid valve is structurally normal.  There      does appear to be mild to moderate tricuspid regurgitation.  4.  Pulmonic valve not well seen.  5.  Aorta:  Aortic root dimension normal 2.6.  6.  Left atrium:  Left atrial size upper limits of normal at 3.7.  7.  Right atrium poorly seen but probably normal.  8.  Left ventricle:  Left ventricular chamber dimensions are on the small      side of normal and systolic dimension 2.2 and diastolic dimension 3.2.      There is asymmetric septal hypertrophy with septum measuring 1.5 to 1.6      and posterior wall also slightly hypertrophied measuring 1.2.  There is      no evidence of aortic outflow tract obstruction from the peak flow      velocities.  There was no evidence of LV thrombus.  LVEF appears to be      60% or better.   FINAL DIAGNOSES:  1.  Normal left ventricular systolic function with mild to moderate      asymmetric  left ventricular hypertrophy.  2.  Mild mitral regurgitation.  3.  Mild to moderate tricuspid regurgitation with elevated pulmonary artery      pressures of 40 mm Hg.  4.  No evidence of pericardial effusion.  By comparison to the last      echocardiogram dated Sep 10, 2001 there does not appear to be any      appreciable difference in this echocardiogram.           ______________________________  Madaline Savage, M.D.     WHG/MEDQ  D:  01/28/2005  T:  01/28/2005  Job:  [id number]   cc:   Corrie Mckusick, M.D.  Fax: [phone]   Madaline Savage, M.D.  Fax: [phone]

## 2010-09-14 NOTE — H&P (Signed)
NAME:  Catherine [name], Catherine [name]                   ACCOUNT NO.:  [id number]   MEDICAL RECORD NO.:  [id number]          PATIENT TYPE:  INP   LOCATION:  A309                          FACILITY:  [facility]   PHYSICIAN:  J. Darreld Mclean, M.D. DATE OF BIRTH:  [birth year]   DATE OF ADMISSION:  12/02/2004  DATE OF DISCHARGE:  LH                                HISTORY & PHYSICAL   CHIEF COMPLAINT:  I fell and hurt my hip.   HISTORY OF PRESENT ILLNESS:  The patient is a 77 year old female who fell at  home last night and injured her left hip. X-ray shows displaced subcapital  fracture of the left hip. There are no other apparent injuries. She did not  hit her head. She is not sure exactly why she fell. She said turned to the  left, got unbalanced, and just fell down. She did not hurt anything else.  There is no head injury. She lives with her husband who is 95.   The patient is a diabetic, and her blood pressure was over 400 on admission.  She is on insulin. She sees Dr. Nobie Putnam for this.   CURRENT MEDICATIONS:  1.  Avandia 8 mg p.o. daily.  2.  Vytorin 10 mg daily.  3.  Glyburide 2.5 mg p.o. t.i.d.  4.  Femara 2.5 mg p.o. daily.  5.  One baby aspirin 81 mg daily.  6.  Lotrel 5 mg daily.   ALLERGIES:  She denies any allergies.   PAST SURGICAL HISTORY:  1.  She has had thyroid surgery.  2.  Three hemorrhoidectomies.  3.  Hysterectomy.  4.  Cholecystectomy.  5.  Left mastectomy. Left mastectomy was last year.   PAST MEDICAL HISTORY:  She is basically in good health. The patient says she  has a history of a small stroke several years ago but resolved. No history  of heart attack. No history of TB or pneumonia. No history of GI or GU  problems. She had a fractured left shoulder approximately three years ago  which I treated.   SOCIAL HISTORY:  The patient is married. Lives here in [place] with her  husband who is 95 who is an invalid. Her daughter is present.   PHYSICAL EXAMINATION:  GENERAL:   Examination reveals an alert, cooperative  female in no apparent distress. She is laying in bed. She had Buck's  traction applied to the left lower leg.  HEENT:  Negative.  NECK:  Supple.  LUNGS:  Clear to P&A.  HEART:  Regular without murmur heard.  ABDOMEN:  Soft, nontender, without masses. Slightly obese.  EXTREMITIES:  Left leg tenderness and pain. Neurovascularly intact. Upper  extremities within normal limits.  CENTRAL NERVOUS SYSTEM:  Intact.  SKIN:  Intact.   IMPRESSION:  1.  Subcapital fracture, left femoral neck, displaced.  2.  Diabetes mellitus.  3.  Status post mastectomy of the left breast last year.  4.  Status post thyroidectomy.   PLAN:  Total hip replacement on the left. Discussed the procedure, risks and  imponderables with the patient and her  daughter, and these include  infection, leg length inequality, pulmonary embolism which could lead to  death, nerve injury, need for PT, possible placement after surgery.  Discussed spinal anesthesia, and they appeared to understand and agreed to  that. Awaiting consult from Dr. Regino Schultze.       JWK/MEDQ  D:  12/03/2004  T:  12/03/2004  Job:  [id number]

## 2010-09-14 NOTE — Op Note (Signed)
NAME:  Catherine [name], Catherine [name]                             ACCOUNT NO.:  [id number]   MEDICAL RECORD NO.:  [id number]                   PATIENT TYPE:  AMB   LOCATION:  DAY                                  FACILITY:  [facility]   PHYSICIAN:  Dalia Heading, M.D.               DATE OF BIRTH:  [birth year]   DATE OF PROCEDURE:  DATE OF DISCHARGE:                                 OPERATIVE REPORT   PREOPERATIVE DIAGNOSIS:  Left breast carcinoma.   POSTOPERATIVE DIAGNOSIS:  Left breast carcinoma.   PROCEDURE:  Left modified radical mastectomy.   SURGEON:  Dalia Heading, M.D.   ANESTHESIA:  General endotracheal.   INDICATIONS:  The patient is a 77 year old white female who was found  recently, on biopsy, to have invasive ductal carcinoma.  After extensive  discussion with the patient, she elected to proceed with a left modified  radical mastectomy.  The risks and benefits of the procedure including:  Bleeding, infection, and nerve injury were fully explained to the patient,  who gave informed consent.   DESCRIPTION OF PROCEDURE:  The patient was placed in the supine position.  After induction of general endotracheal anesthesia, the left breast and  axillary were prepped and draped using the usual sterile technique with  Betadine.  Surgical site confirmation was performed.   An elliptical incision was made medial-to-lateral around the nipple.  A  superior flap was then formed up to the clavicle.  An inferior flap was  performed to the chest wall.  The breast along with the fascia was freed  away from the pectoralis major muscle medial-to-lateral without difficulty.  A level 2 axillary dissection was then performed.  Two obviously enlarged  lymph nodes were found.  These were sent with this specimen for further  examination and treatment.  Any bleeding was controlled using Bovie  electrocautery or clips.  The long thoracic nerve was noted to be intact.  The wound was irrigated with normal saline.  A  Jackson-Pratt drain was then  placed into the left axilla and under the skin flap and another drain was  placed under the skin flap.  Both were brought out through separate stab  wounds inferior to the incision.  These were secured at the skin level using  a 2-0 Nylon interrupted suture.  The subcutaneous layer was reapproximated  using a 3-0 Vicryl interrupted suture.  The skin was closed using staples.  Betadine ointment and dry sterile dressing were applied.   All tape and needle counts were correct at the end of the procedure.  The  patient was extubated in the operating room and went back to recovery room  awake, in stable condition.   COMPLICATIONS:  None.   SPECIMENS:  Left breast and axilla.   BLOOD LOSS:  100 cc.   DRAINS:  Superior drain, flap; inferior drain, axilla.      ___________________________________________  Dalia Heading, M.D.   MAJ/MEDQ  D:  08/26/2003  T:  08/26/2003  Job:  [id number]   cc:   Dalia Heading, M.D.  [address], [place]  [place]  [place] [address]  Fax: [phone]   Corrie Mckusick, M.D.  [phone] [place]. [place]  [place]  [place] [address]  Fax: [phone]

## 2010-09-14 NOTE — Procedures (Signed)
[facility]  Patient:    Catherine [name], Catherine [name] Visit Number: [id number] MRN: [id number]          Service Type: OUT Location: RAD Attending Physician:  Darlin Priestly Dictated by:   Kari Baars, M.D. Proc. Date: 09/02/01 Admit Date:  08/28/2001 Discharge Date: 08/28/2001                      Pulmonary Function Test Inter.  RESULTS: 1. Spirometry reveals mild to moderate ventilatory defect with air flow    obstruction at the level of the "small airway.". 2. Lung volumes are mildly decreased. 3. DLCO severely decreased. 4. Arterial blood gases are normal at rest. Dictated by:   Kari Baars, M.D. Attending Physician:  Darlin Priestly DD:  09/02/01 TD:  09/04/01 Job: [id number]

## 2010-09-14 NOTE — H&P (Signed)
NAME:  Catherine [name], Catherine [name]                   ACCOUNT NO.:  [id number]   MEDICAL RECORD NO.:  [id number]          PATIENT TYPE:   LOCATION:                                 FACILITY:   PHYSICIAN:  Dalia Heading, M.D.  DATE OF BIRTH:  [birth year]   DATE OF ADMISSION:  04/02/2004  DATE OF DISCHARGE:  LH                                HISTORY & PHYSICAL   CHIEF COMPLAINT:  Need for screening colonoscopy.   HISTORY OF PRESENT ILLNESS:  The patient is a 77 year old white female who  presents for a screening colonoscopy.  There is no history of abdominal  pain, weight loss, nausea, vomiting, diarrhea, constipation, melena, or  hematochezia.  She has never had a colonoscopy.  There is no family history  of colon cancer.  She does have a history of hemorrhoidal disease with  surgeries.   PAST MEDICAL HISTORY:  1.  Noninsulin-dependent diabetes mellitus.  2.  Hypertension.  3.  Anxiety disorder.  4.  Breast cancer.   PAST SURGICAL HISTORY:  1.  Left modified radical mastectomy in 2005.  2.  Hysterectomy.  3.  Hemorrhoidectomy.  4.  Cholecystectomy.  5.  Right thyroid lobectomy.   CURRENT MEDICATIONS:  1.  Femara 2.5 mg p.o. daily.  2.  Vytorin 10/20 mg p.o. daily.  3.  Avandia 8 mg p.o. daily.  4.  Norvasc 9 mg p.o. daily.  5.  Diazepam as needed.  6.  Celexa as needed.   ALLERGIES:  No known drug allergies.   REVIEW OF SYSTEMS:  Noncontributory.   PHYSICAL EXAMINATION:  GENERAL:  The patient is a well-developed, well-  nourished white female in no acute distress.  VITAL SIGNS:  She is afebrile, and vital signs are stable.  CHEST:  Lungs clear to auscultation with equal breath sounds bilaterally.  CARDIAC:  Regular rate and rhythm without S3, S4, or murmurs.  ABDOMEN:  Soft, nontender, nondistended.  No hepatosplenomegaly or masses  are noted.  RECTAL:  Exam deferred to the procedure.   IMPRESSION:  Need for screening colonoscopy.   PLAN:  The patient is scheduled for a  colonoscopy on April 02, 2004.  The  risks and benefits of the procedure, including bleeding and perforation,  were fully explained to the patient, who gave informed consent.     Mark   MAJ/MEDQ  D:  03/29/2004  T:  03/29/2004  Job:  [id number]   cc:   [facility]  Fax: [phone]   Corrie Mckusick, M.D.  Fax: [phone]

## 2010-09-14 NOTE — Discharge Summary (Signed)
NAME:  Catherine [name], Catherine [name]                             ACCOUNT NO.:  [id number]   MEDICAL RECORD NO.:  [id number]                   PATIENT TYPE:  INP   LOCATION:  A325                                 FACILITY:  [facility]   PHYSICIAN:  Dalia Heading, M.D.               DATE OF BIRTH:  April 09, 1924   DATE OF ADMISSION:  08/26/2003  DATE OF DISCHARGE:  08/29/2003                                 DISCHARGE SUMMARY   HOSPITAL COURSE SUMMARY:  The is a 77 year old white female, recently  diagnosed with infiltrating ductal carcinoma of left breast who presented to  [facility] for left modified radical mastectomy.  This was  performed on August 26, 2003. She tolerated the procedure well.  Her  postoperative course was remarkable for shortness of breath secondary to  anemia.  She was transferred 1 unit of packed red blood cells.  Her  shortness of breath did resolve.  Pulse oximetry on room air was 95%.   The patient is being discharged home on Aug 29, 2003 in good and improved  condition.  Final pathology is still pending.   DISCHARGE INSTRUCTIONS:  1. The patient will be followed by home health concerning her remaining JP     drain. It is to be drained and recorded twice a day.  2. She is to follow up with Dr. Franky Macho on Sep 01, 2003.   DISCHARGE MEDICATIONS:  1. Iron supplement 325 mg p.o. b.i.d.  2. Darvocet-N 100 one tablet p.o. q.4h. p.r.n. pain.  3. She is to resume all of her other medications as previous prescribed.   PRINCIPAL DIAGNOSES:  1. Left breast carcinoma.  2. Anemia secondary to surgery.  3. Hypertension.  4. Non-insulin-dependent diabetes mellitus.  5. Anxiety disorder.   PRINCIPAL PROCEDURE:  Left modified radical mastectomy on August 26, 2003.     ___________________________________________                                         Dalia Heading, M.D.   MAJ/MEDQ  D:  08/29/2003  T:  08/29/2003  Job:  [id number]   cc:   Dalia Heading, M.D.  [address],  [place]  [place] [address]  Fax: [phone]   Corrie Mckusick, M.D.  [phone] [address] [place]. [place]  [place] [address]  Fax: [phone]   Dr. Jess Barters  [facility]

## 2010-09-14 NOTE — Op Note (Signed)
NAME:  Catherine [name], Catherine [name]                             ACCOUNT NO.:  [id number]   MEDICAL RECORD NO.:  [id number]                   PATIENT TYPE:  AMB   LOCATION:  DAY                                  FACILITY:  [facility]   PHYSICIAN:  Dalia Heading, M.D.               DATE OF BIRTH:  03/21/1924   DATE OF PROCEDURE:  08/19/2003  DATE OF DISCHARGE:                                 OPERATIVE REPORT   PREOPERATIVE DIAGNOSIS:  Left breast mass.   POSTOPERATIVE DIAGNOSIS:  Left breast mass.   PROCEDURE:  Left breast biopsy.   SURGEON:  Dalia Heading, M.D.   ANESTHESIA:  MAC.   INDICATIONS:  The patient is a 77 year old white female who has a dominant  mass in the left breast at the 12 o'clock position.  Mammogram and  ultrasound results are equivocal.  The patient now comes to the operating  room for a left breast biopsy.  The risks and benefits of the procedure,  including bleeding and infection, were fully explained to the patient, who  gave informed consent.   PROCEDURE NOTE:  The patient was placed in the supine position.  The left  breast was prepped and draped using the usual sterile technique with  Betadine.  Xylocaine 1% was used for local anesthesia.  Surgical site  confirmation was performed.   A curvilinear incision was made over the mass at the 12 o'clock position in  the left breast.  The mass was excised without difficulty.  It was sent to  pathology for further examination.  Any bleeding was controlled using Bovie  electrocautery.  The skin was reapproximated using a 4-0 Vicryl subcuticular  suture.  Steri-Strips and dry sterile dressing were applied.   All tape and needle counts were correct at the end of the procedure.  The  patient was transferred to PACU in stable condition.   COMPLICATIONS:  None.   SPECIMENS:  Left breast mass.   ESTIMATED BLOOD LOSS:  Less than 50 mL.      ___________________________________________  Dalia Heading, M.D.   MAJ/MEDQ  D:  08/19/2003  T:  08/20/2003  Job:  [id number]   cc:   Corrie Mckusick, M.D.  [address] [place]. [place]  [place]  [place] [address]  Fax: [phone]

## 2010-09-14 NOTE — Op Note (Signed)
NAME:  Catherine [name], Catherine [name]                   ACCOUNT NO.:  [id number]   MEDICAL RECORD NO.:  [id number]          PATIENT TYPE:  INP   LOCATION:  A309                          FACILITY:  [facility]   PHYSICIAN:  J. Darreld Mclean, M.D. DATE OF BIRTH:  24-Feb-1924   DATE OF PROCEDURE:  12/03/2004  DATE OF DISCHARGE:                                 OPERATIVE REPORT   PREOPERATIVE DIAGNOSIS:  Fracture, left femoral neck, subcapital, displaced.   POSTOPERATIVE DIAGNOSIS:  Fracture, left femoral neck, subcapital,  displaced.   PROCEDURE:  Osteonics bipolar hip replacement on the left using a #8 stem  press fit, +5 neck and a 47-mm head.   ANESTHESIA:  Spinal.   SURGEON:  Dr. Hilda Lias.   ESTIMATED BLOOD LOSS:  200 cc _______________ replaced.   One large Hemovac.   INDICATIONS:  The patient is a 77 year old female who fell last night and  injured her left hip. X-rays showed a displaced subcapital. No other injury.  She is diabetic. She was seen and evaluated by Dr. Regino Schultze and found to be  an operative candidate. She is at high risk because of her other medical  problems. Risks and imponderables were discussed preoperatively. She  appeared to understand.   DESCRIPTION OF PROCEDURE:  The patient was seen in the holding area. The  left hip was identified as the correct surgical site. I placed initials on  it, and so did she. She was brought back to the operating room, begun spinal  anesthesia, was placed on the operating room table, left side up/right side  down, to keep in position held in place by deflated beanbag. The patient  prepped and draped in the usual manner. We had a time out and reidentified  the patient as [name] and that were doing a left hip procedure. Incision  was made, posterior approach. The sciatic nerve was identified and protected  with a Penrose drain. Short external rotators were identified, tagged and  cut. Hip capsule was opened, hematoma expressed, culture obtained.  Femoral  head was identified. Femoral head was removed with a corkscrew. Femoral head  measured 47 mm. Femoral neck had a box osteotome used, and then it was  reamed up to a size 8 and rasped up to a size 8. Femoral neck was cut  appropriately. Trial reduction carried out. We found a +5 neck, 47 head, 8  stem fit the best. These were removed. Permanent prostheses were inserted.  Eight stem, +5 neck, 47 head. Leg lengths appeared to be normal with no  apparent injury to the sciatic nerve. X-rays were taken which showed very  good position and alignment of the prosthesis. Short external rotators were  reapproximated through previously applied tags. Penrose drain was removed  from the sciatica nerve, and there was no apparent injury. The fascia layer  was reapproximated using interrupted figure-of-eight 0 sutures. Subcutaneous  tissues closed  in layers with 2-0 plain and skin reapproximated with skin staples. Sterile  dressing and bulky dressing applied. She had an abduction pillow placed  between the legs. She will go to  recovery room in good condition. Flowtrons  will be applied there. She is, of course, admitted.       JWK/MEDQ  D:  12/03/2004  T:  12/03/2004  Job:  [id number]   cc:   Kirk Ruths, M.D.  [address]  [place]  [place] [address]  Fax: [phone]

## 2010-09-14 NOTE — H&P (Signed)
NAME:  Catherine [name], Catherine [name]                             ACCOUNT NO.:  [id number]   MEDICAL RECORD NO.:  [id number]                   PATIENT TYPE:  AMB   LOCATION:  DAY                                  FACILITY:  [facility]   PHYSICIAN:  Dalia Heading, M.D.               DATE OF BIRTH:  [birth year]   DATE OF ADMISSION:  08/19/2003  DATE OF DISCHARGE:  08/19/2003                                HISTORY & PHYSICAL   CHIEF COMPLAINT:  Left breast carcinoma.   HISTORY OF PRESENT ILLNESS:  The patient is a 77 year old white female who  recently underwent a left breast biopsy and was found to have infiltrating  ductal carcinoma.  The margins were not clear.  There is no family history  of breast carcinoma.  She is status post a hysterectomy in the remote past  for cancer.   PAST MEDICAL HISTORY:  1. Hypertension.  2. Noninsulin-dependent diabetes mellitus.  3. Anxiety disorder.   PAST SURGICAL HISTORY:  1. Left breast biopsy.  2. Hysterectomy.  3. Hemorrhoidectomy.  4. Cholecystectomy.  5. Right thyroid lobectomy.   CURRENT MEDICATIONS:  1. Glucovance 2.5 mg p.o. daily.  2. Avandia 8 mg p.o. daily.  3. Norvasc 5 mg p.o. daily.  4. Diazepam as needed.  5. Celexa as needed.   ALLERGIES:  No known drug allergies.   REVIEW OF SYSTEMS:  The patient denies drinking or smoking.  She denies any  recent chest pain, shortness of breath, leg swelling, CVA, or insulin-  dependent diabetes mellitus.   PHYSICAL EXAMINATION:  GENERAL:  The patient is a well-developed, well-  nourished, white female in no acute distress.  VITAL SIGNS:  She is afebrile, and vital signs are stable.  NECK:  Supple without lymphadenopathy.  LUNGS:  Clear to auscultation with equal breath sounds bilaterally.  HEART:  Regular rate and rhythm without S3, S4, or murmurs.  BREASTS:  Right breast examination reveals no dominant mass, nipple  discharge, and dimpling.  The axilla is negative for palpable nodes.  Left  breast  examination reveals a surgical scar which is healing well along the  superior aspect of the left breast.  No nipple discharge is noted.  The  axilla is negative for palpable nodes.   IMPRESSION:  Left breast carcinoma.   PLAN:  The patient is scheduled for a left modified radical mastectomy on  August 26, 2003.  The risks and benefits of the procedure, including  bleeding, infection, nerve injury, and the possibility of a blood  transfusion were fully explained to the patient, who gave informed consent.     ___________________________________________                                         Dalia Heading, M.D.   MAJ/MEDQ  D:  08/23/2003  T:  08/23/2003  Job:  [id number]   cc:   Corrie Mckusick, M.D.  [address] [place]. [place]  [place] [address]  Fax: [phone]   Jess Barters(?), M.D  [facility]

## 2010-12-13 ENCOUNTER — Encounter: Payer: Self-pay | Admitting: *Deleted

## 2010-12-13 ENCOUNTER — Inpatient Hospital Stay (HOSPITAL_COMMUNITY)
Admission: EM | Admit: 2010-12-13 | Discharge: 2010-12-18 | DRG: 389 | Disposition: A | Discharge status: Home / Self Care | Payer: Medicare Other | Attending: Internal Medicine | Admitting: Internal Medicine

## 2010-12-13 ENCOUNTER — Emergency Department (HOSPITAL_COMMUNITY): Payer: Medicare Other

## 2010-12-13 DIAGNOSIS — F3289 Other specified depressive episodes: Secondary | ICD-10-CM | POA: Diagnosis present

## 2010-12-13 DIAGNOSIS — Z7901 Long term (current) use of anticoagulants: Secondary | ICD-10-CM | POA: Diagnosis present

## 2010-12-13 DIAGNOSIS — D649 Anemia, unspecified: Secondary | ICD-10-CM | POA: Diagnosis present

## 2010-12-13 DIAGNOSIS — I2789 Other specified pulmonary heart diseases: Secondary | ICD-10-CM | POA: Diagnosis present

## 2010-12-13 DIAGNOSIS — I4891 Unspecified atrial fibrillation: Secondary | ICD-10-CM | POA: Diagnosis present

## 2010-12-13 DIAGNOSIS — R197 Diarrhea, unspecified: Secondary | ICD-10-CM

## 2010-12-13 DIAGNOSIS — I509 Heart failure, unspecified: Secondary | ICD-10-CM | POA: Diagnosis present

## 2010-12-13 DIAGNOSIS — A498 Other bacterial infections of unspecified site: Secondary | ICD-10-CM | POA: Diagnosis present

## 2010-12-13 DIAGNOSIS — F329 Major depressive disorder, single episode, unspecified: Secondary | ICD-10-CM | POA: Diagnosis present

## 2010-12-13 DIAGNOSIS — K560 Paralytic ileus: Principal | ICD-10-CM | POA: Diagnosis present

## 2010-12-13 DIAGNOSIS — E119 Type 2 diabetes mellitus without complications: Secondary | ICD-10-CM | POA: Diagnosis present

## 2010-12-13 DIAGNOSIS — E876 Hypokalemia: Secondary | ICD-10-CM | POA: Diagnosis not present

## 2010-12-13 DIAGNOSIS — Z853 Personal history of malignant neoplasm of breast: Secondary | ICD-10-CM

## 2010-12-13 DIAGNOSIS — N390 Urinary tract infection, site not specified: Secondary | ICD-10-CM | POA: Diagnosis present

## 2010-12-13 HISTORY — DX: Disorder of the skin and subcutaneous tissue, unspecified: L98.9

## 2010-12-13 HISTORY — DX: Pulmonary hypertension, unspecified: I27.20

## 2010-12-13 HISTORY — DX: Anemia, unspecified: D64.9

## 2010-12-13 HISTORY — DX: Chronic obstructive pulmonary disease, unspecified: J44.9

## 2010-12-13 HISTORY — DX: Dizziness and giddiness: R42

## 2010-12-13 HISTORY — DX: Unspecified atrial fibrillation: I48.91

## 2010-12-13 HISTORY — DX: Cerebral infarction, unspecified: I63.9

## 2010-12-13 HISTORY — DX: Nontoxic multinodular goiter: E04.2

## 2010-12-13 LAB — COMPREHENSIVE METABOLIC PANEL
ALT: 10 U/L (ref 0–35)
AST: 20 U/L (ref 0–37)
Albumin: 4.4 g/dL (ref 3.5–5.2)
Chloride: 94 mEq/L — ABNORMAL LOW (ref 96–112)
Creatinine, Ser: 1.36 mg/dL — ABNORMAL HIGH (ref 0.50–1.10)
Sodium: 135 mEq/L (ref 135–145)
Total Bilirubin: 0.3 mg/dL (ref 0.3–1.2)

## 2010-12-13 LAB — CBC
HCT: 35.6 % — ABNORMAL LOW (ref 36.0–46.0)
MCHC: 33.7 g/dL (ref 30.0–36.0)
Platelets: 299 10*3/uL (ref 150–400)
RDW: 13 % (ref 11.5–15.5)
WBC: 16 10*3/uL — ABNORMAL HIGH (ref 4.0–10.5)

## 2010-12-13 LAB — DIFFERENTIAL
Basophils Absolute: 0 10*3/uL (ref 0.0–0.1)
Basophils Relative: 0 % (ref 0–1)
Monocytes Absolute: 1.5 10*3/uL — ABNORMAL HIGH (ref 0.1–1.0)
Neutro Abs: 13 10*3/uL — ABNORMAL HIGH (ref 1.7–7.7)
Neutrophils Relative %: 82 % — ABNORMAL HIGH (ref 43–77)

## 2010-12-13 LAB — URINALYSIS, ROUTINE W REFLEX MICROSCOPIC
Glucose, UA: NEGATIVE mg/dL
Specific Gravity, Urine: >1.03 — ABNORMAL HIGH (ref 1.005–1.030)

## 2010-12-13 LAB — URINE MICROSCOPIC-ADD ON

## 2010-12-13 LAB — LACTIC ACID, PLASMA: Lactic Acid, Venous: 2.2 mmol/L (ref 0.5–2.2)

## 2010-12-13 MED ORDER — SODIUM CHLORIDE 0.9 % IV SOLN
Freq: Once | INTRAVENOUS | Status: AC
  Administered 2010-12-13: 1000 mL via INTRAVENOUS

## 2010-12-13 MED ORDER — ONDANSETRON HCL 4 MG/2ML IJ SOLN
4.0000 mg | Freq: Once | INTRAMUSCULAR | Status: AC
  Administered 2010-12-13: 4 mg via INTRAVENOUS
  Filled 2010-12-13: qty 2

## 2010-12-13 MED ORDER — MORPHINE SULFATE 2 MG/ML IJ SOLN
2.0000 mg | Freq: Once | INTRAMUSCULAR | Status: AC
  Administered 2010-12-13: 2 mg via INTRAVENOUS
  Filled 2010-12-13: qty 1

## 2010-12-13 MED ORDER — IOHEXOL 300 MG/ML  SOLN
80.0000 mL | Freq: Once | INTRAMUSCULAR | Status: AC | PRN
  Administered 2010-12-13: 80 mL via INTRAVENOUS

## 2010-12-13 MED ORDER — ONDANSETRON HCL 4 MG/2ML IJ SOLN
4.0000 mg | Freq: Once | INTRAMUSCULAR | Status: AC
  Administered 2010-12-13: 4 mg via INTRAVENOUS
  Filled 2010-12-13 (×2): qty 2

## 2010-12-13 MED ORDER — CIPROFLOXACIN IN D5W 400 MG/200ML IV SOLN
400.0000 mg | Freq: Once | INTRAVENOUS | Status: AC
  Administered 2010-12-13: 400 mg via INTRAVENOUS
  Filled 2010-12-13: qty 200

## 2010-12-13 NOTE — ED Notes (Signed)
Abdominal pain since this morning w/ diarrhea

## 2010-12-13 NOTE — ED Notes (Signed)
Pt requesting pain medication at this time. EDP notified. 

## 2010-12-13 NOTE — ED Notes (Signed)
edp advised pt sick again from drinking contrast & of creatine level. edp advised to go ahead & have pt scanned. Ct called advised also.

## 2010-12-13 NOTE — ED Provider Notes (Signed)
History     CSN: [id number] Arrival date & time: 12/13/2010  7:08 PM  Chief Complaint  Patient presents with  . Abdominal Pain   HPI Comments: Onset of bilateral upper abd pain just after eating lunch today.  No hx of same - notes abd is distended also.  Patient is a 77 y.o. female presenting with abdominal pain. The history is provided by the patient and a friend.  Abdominal Pain The primary symptoms of the illness include abdominal pain and shortness of breath. The primary symptoms of the illness do not include fever, fatigue, nausea, vomiting, diarrhea, hematemesis, hematochezia or dysuria. The current episode started 3 to 5 hours ago. The onset of the illness was gradual. The problem has not changed since onset. The abdominal pain began 3 to 5 hours ago. The pain came on gradually. The abdominal pain has been unchanged since its onset. Pain Location: upper abdomen and under ribs on R and L. The abdominal pain does not radiate. The severity of the abdominal pain is 10/10. The abdominal pain is relieved by nothing. Exacerbated by: palpation.  Associated with: distended abdomen. The patient states that she believes she is currently not pregnant. The patient has not had a change in bowel habit (2 normal BM's prior to onset of pain.). Symptoms associated with the illness do not include chills, anorexia, diaphoresis, heartburn, constipation, urgency, hematuria, frequency or back pain. Associated medical issues comments: hx of hsyterectomy, and cholecystectomy.    Past Medical History  Diagnosis Date  . CHF (congestive heart failure)   . Cancer   . Diabetes mellitus   . Hypertension   . COPD (chronic obstructive pulmonary disease)   . Anemia   . Vertigo     Past Surgical History  Procedure Date  . Cholecystectomy   . Hemorrhoid surgery   . Abdominal hysterectomy   . Mastectomy     left breast  . Goiter     History reviewed. No pertinent family history.  History  Substance Use  Topics  . Smoking status: Never Smoker   . Smokeless tobacco: Not on file  . Alcohol Use: No    OB History    Grav Para Term Preterm Abortions TAB SAB Ect Mult Living                  Review of Systems  Constitutional: Negative for fever, chills, diaphoresis and fatigue.  Respiratory: Positive for shortness of breath.   Gastrointestinal: Positive for abdominal pain. Negative for heartburn, nausea, vomiting, diarrhea, constipation, hematochezia, anorexia and hematemesis.  Genitourinary: Negative for dysuria, urgency, frequency and hematuria.  Musculoskeletal: Negative for back pain.  All other systems reviewed and are negative.    Physical Exam  BP 119/59  Pulse 62  Temp(Src) 97.8 F (36.6 C) (Oral)  Resp 20  Ht 5\' 3"  (1.6 m)  Wt 135 lb (61.236 kg)  BMI 23.91 kg/m2  SpO2 98%  Physical Exam  Nursing note and vitals reviewed. Constitutional: She appears well-developed and well-nourished. No distress.  HENT:  Head: Normocephalic and atraumatic.  Mouth/Throat: Oropharynx is clear and moist. No oropharyngeal exudate.  Eyes: Conjunctivae and EOM are normal. Pupils are equal, round, and reactive to light. Right eye exhibits no discharge. Left eye exhibits no discharge. No scleral icterus.  Neck: Normal range of motion. Neck supple. No JVD present. No thyromegaly present.  Cardiovascular: Normal rate, regular rhythm, normal heart sounds and intact distal pulses.  Exam reveals no gallop and no friction rub.  No murmur heard. Pulmonary/Chest: Effort normal and breath sounds normal. No respiratory distress. She has no wheezes. She has no rales.  Abdominal: Soft. She exhibits distension. She exhibits no mass. There is tenderness.       Tympanitic sounds to percussion, diffuse abd ttp.  Bowel sounds decreased.  Musculoskeletal: Normal range of motion. She exhibits no edema and no tenderness.  Lymphadenopathy:    She has no cervical adenopathy.  Neurological: She is alert.  Coordination normal.  Skin: Skin is warm and dry. No rash noted. No erythema.  Psychiatric: She has a normal mood and affect. Her behavior is normal.    ED Course  Procedures  MDM Labs, IV saline lock, meds, CT to r/o obstruction or other source.  Patient states that she continues to have nausea and vomiting. Labs reviewed as below with elevated white blood cell count and abnormal CT scan showing likely colitis with gastroenteritis.  Results for orders placed during the hospital encounter of 12/13/10  LACTIC ACID, PLASMA      Component Value Range   Lactic Acid, Venous 2.2  0.5 - 2.2 (mmol/L)  CBC      Component Value Range   WBC 16.0 (*) 4.0 - 10.5 (K/uL)   RBC 3.86 (*) 3.87 - 5.11 (MIL/uL)   Hemoglobin 12.0  12.0 - 15.0 (g/dL)   HCT 16.1 (*) 09.6 - 46.0 (%)   MCV 92.2  78.0 - 100.0 (fL)   MCH 31.1  26.0 - 34.0 (pg)   MCHC 33.7  30.0 - 36.0 (g/dL)   RDW 04.5  40.9 - 81.1 (%)   Platelets 299  150 - 400 (K/uL)  DIFFERENTIAL      Component Value Range   Neutrophils Relative 82 (*) 43 - 77 (%)   Neutro Abs 13.0 (*) 1.7 - 7.7 (K/uL)   Lymphocytes Relative 8 (*) 12 - 46 (%)   Lymphs Abs 1.3  0.7 - 4.0 (K/uL)   Monocytes Relative 9  3 - 12 (%)   Monocytes Absolute 1.5 (*) 0.1 - 1.0 (K/uL)   Eosinophils Relative 1  0 - 5 (%)   Eosinophils Absolute 0.1  0.0 - 0.7 (K/uL)   Basophils Relative 0  0 - 1 (%)   Basophils Absolute 0.0  0.0 - 0.1 (K/uL)  COMPREHENSIVE METABOLIC PANEL      Component Value Range   Sodium 135  135 - 145 (mEq/L)   Potassium 4.7  3.5 - 5.1 (mEq/L)   Chloride 94 (*) 96 - 112 (mEq/L)   CO2 23  19 - 32 (mEq/L)   Glucose, Bld 169 (*) 70 - 99 (mg/dL)   BUN 22  6 - 23 (mg/dL)   Creatinine, Ser 9.14 (*) 0.50 - 1.10 (mg/dL)   Calcium 78.2 (*) 8.4 - 10.5 (mg/dL)   Total Protein 8.0  6.0 - 8.3 (g/dL)   Albumin 4.4  3.5 - 5.2 (g/dL)   AST 20  0 - 37 (U/L)   ALT 10  0 - 35 (U/L)   Alkaline Phosphatase 68  39 - 117 (U/L)   Total Bilirubin 0.3  0.3 - 1.2 (mg/dL)     GFR calc non Af Amer 37 (*) >60 (mL/min)   GFR calc Af Amer 45 (*) >60 (mL/min)  URINALYSIS, ROUTINE W REFLEX MICROSCOPIC      Component Value Range   Color, Urine YELLOW  YELLOW    Appearance HAZY (*) CLEAR    Specific Gravity, Urine >1.030 (*) 1.005 - 1.030  pH 5.5  5.0 - 8.0    Glucose, UA NEGATIVE  NEGATIVE (mg/dL)   Hgb urine dipstick SMALL (*) NEGATIVE    Bilirubin Urine NEGATIVE  NEGATIVE    Ketones, ur TRACE (*) NEGATIVE (mg/dL)   Protein, ur TRACE (*) NEGATIVE (mg/dL)   Urobilinogen, UA 0.2  0.0 - 1.0 (mg/dL)   Nitrite NEGATIVE  NEGATIVE    Leukocytes, UA LARGE (*) NEGATIVE   URINE MICROSCOPIC-ADD ON      Component Value Range   Squamous Epithelial / LPF FEW (*) RARE    WBC, UA TOO NUMEROUS TO COUNT  <3 (WBC/hpf)   RBC / HPF 3-6  <3 (RBC/hpf)   Bacteria, UA MANY (*) RARE    Ct Abdomen Pelvis W Contrast  12/13/2010  *RADIOLOGY REPORT*  Clinical Data: Abdominal pain.  Distention.  Diarrhea.  Diabetes. Congestive heart failure.  Hypertension.  CT ABDOMEN AND PELVIS WITH CONTRAST  Technique:  Multidetector CT imaging of the abdomen and pelvis was performed following the standard protocol during bolus administration of intravenous contrast.  Contrast: 80 ml Omnipaque-300  Comparison: 12/16/2009  Findings: There is linear subsegmental atelectasis or scarring in both lower lobes and along the left inferior major fissure.  A calcified granuloma is present in the right middle lobe inferiorly.  Calcification of the mitral valve is partially imaged.  There is a moderate sized hiatal hernia.  The gallbladder is surgically absent.  There is mild intrahepatic biliary dilatation.  Common bile duct measures up to 11 mm, likely representing physiologic dilatation.  A periampullary duodenal diverticulum is present.  Splenic artery atherosclerotic calcification is present. Dextroconvex thoracolumbar scoliosis is noted.  Fatty atrophy of the pancreas is present.  A 1.0 x 1.3 cm myelolipoma of the  right adrenal gland is present on image 13 of series 2.  A 6 mm hypodense lesion in the right kidney upper pole on image 14 of series 7 is likely a cyst.  Atherosclerotic calcification of the abdominal aorta is noted.  No pathologic retroperitoneal or porta hepatis adenopathy is identified.  No pathologic pelvic adenopathy is identified.  Bilateral hip implants are present.  The streak artifact obscures the lower pelvis.  By report the patient has had prior appendectomy, but there is a structure which looks like normal appendix in the right lower quadrant.  Sigmoid diverticulosis is present.  No active diverticulitis is identified although portions of sigmoid colon are obscured by streak artifact from the hip implants.  Air-fluid levels are present and mildly prominent colon and in mildly dilated loops of upper abdominal small bowel, query ileus. There are fluid levels in the distal colon compatible with diarrheal process.  Bony demineralization is present.  An L1 compression fracture is noted, age indeterminate, with 5 mm posterior bony retropulsion.  There is also a remote superior endplate compression fracture of T11.  Degenerative disc disease noted at L2-3 and L3-4.  IMPRESSION:  1.  Fluid filled colon with borderline distended loops of small bowel containing air fluid levels.  This may be a manifestation of enteritis/colitis given the underlying diarrheal process. 2.  Atherosclerosis. 3.  Periampullary duodenal diverticulum. 4.  L1 compression fractures age indeterminate and has 5 mm of posterior bony retropulsion.  Old T11 superior endplate compression fracture noted. 5.  Sigmoid diverticulosis. 6.  Calcification of the mitral valve. 7.  Moderate sized hiatal hernia. 8.  Small right adrenal myelolipoma.  Original Report Authenticated By: Dellia Cloud, M.D.    Multiple doses of antiemetics given  with minimal improvement. Urinalysis shows urinary tract infection. We'll admit to the hospital for  further evaluation.  Ciprofloxacin IV ordered.  Discussed with Dr. Felecia Shelling who will admit the patient to the hospital  Vida Roller, MD 12/13/10 2221

## 2010-12-13 NOTE — ED Notes (Signed)
Pt called stated she is sick again after returning to the room. edp notified.

## 2010-12-14 ENCOUNTER — Encounter (HOSPITAL_COMMUNITY): Payer: Self-pay | Admitting: *Deleted

## 2010-12-14 DIAGNOSIS — K560 Paralytic ileus: Secondary | ICD-10-CM

## 2010-12-14 MED ORDER — SIMVASTATIN 10 MG PO TABS
10.0000 mg | ORAL_TABLET | Freq: Every day | ORAL | Status: DC
  Administered 2010-12-14 – 2010-12-17 (×4): 10 mg via ORAL
  Filled 2010-12-14 (×4): qty 1

## 2010-12-14 MED ORDER — DABIGATRAN ETEXILATE MESYLATE 150 MG PO CAPS
150.0000 mg | ORAL_CAPSULE | Freq: Every day | ORAL | Status: DC
  Administered 2010-12-14 – 2010-12-18 (×5): 150 mg via ORAL
  Filled 2010-12-14 (×6): qty 1

## 2010-12-14 MED ORDER — FUROSEMIDE 20 MG PO TABS
20.0000 mg | ORAL_TABLET | Freq: Every day | ORAL | Status: DC
  Administered 2010-12-14 – 2010-12-18 (×5): 20 mg via ORAL
  Filled 2010-12-14 (×5): qty 1

## 2010-12-14 MED ORDER — SODIUM CHLORIDE 0.9 % IV SOLN
INTRAVENOUS | Status: DC
  Administered 2010-12-14: 950 mL via INTRAVENOUS
  Administered 2010-12-15: 1000 mL via INTRAVENOUS
  Administered 2010-12-16: 950 mL via INTRAVENOUS

## 2010-12-14 MED ORDER — CIPROFLOXACIN IN D5W 400 MG/200ML IV SOLN
400.0000 mg | Freq: Two times a day (BID) | INTRAVENOUS | Status: DC
  Administered 2010-12-14 – 2010-12-18 (×8): 400 mg via INTRAVENOUS
  Filled 2010-12-14 (×11): qty 200

## 2010-12-14 MED ORDER — INSULIN ASPART 100 UNIT/ML ~~LOC~~ SOLN: 0.0000 [IU] | Freq: Every day | SUBCUTANEOUS | Status: DC

## 2010-12-14 MED ORDER — CITALOPRAM HYDROBROMIDE 20 MG PO TABS
40.0000 mg | ORAL_TABLET | Freq: Every day | ORAL | Status: DC
  Administered 2010-12-14: 40 mg via ORAL
  Filled 2010-12-14 (×2): qty 2

## 2010-12-14 MED ORDER — FLORA-Q PO CAPS
1.0000 | ORAL_CAPSULE | Freq: Every day | ORAL | Status: DC
  Administered 2010-12-14 – 2010-12-18 (×5): 1 via ORAL
  Filled 2010-12-14 (×5): qty 1

## 2010-12-14 MED ORDER — VITAMIN D 1000 UNITS PO TABS
2000.0000 [IU] | ORAL_TABLET | Freq: Every day | ORAL | Status: DC
  Administered 2010-12-14 – 2010-12-18 (×5): 2000 [IU] via ORAL
  Filled 2010-12-14 (×6): qty 2

## 2010-12-14 MED ORDER — INSULIN ASPART 100 UNIT/ML ~~LOC~~ SOLN
0.0000 [IU] | Freq: Three times a day (TID) | SUBCUTANEOUS | Status: DC
  Administered 2010-12-14: 2 [IU] via SUBCUTANEOUS
  Administered 2010-12-14: 3 [IU] via SUBCUTANEOUS
  Administered 2010-12-15 (×3): 2 [IU] via SUBCUTANEOUS
  Administered 2010-12-16 – 2010-12-18 (×8): 3 [IU] via SUBCUTANEOUS
  Filled 2010-12-14: qty 3

## 2010-12-14 MED ORDER — POTASSIUM CHLORIDE CRYS ER 20 MEQ PO TBCR
20.0000 meq | EXTENDED_RELEASE_TABLET | Freq: Every day | ORAL | Status: DC
  Administered 2010-12-14: 20 meq via ORAL
  Filled 2010-12-14 (×2): qty 1

## 2010-12-14 MED ORDER — METOPROLOL TARTRATE 25 MG PO TABS
25.0000 mg | ORAL_TABLET | Freq: Every day | ORAL | Status: DC
  Administered 2010-12-14: 25 mg via ORAL
  Filled 2010-12-14 (×2): qty 1

## 2010-12-14 NOTE — Consult Note (Cosign Needed)
Referring Provider: Shaune Pollack, MD Primary Care Physician:  Carylon Perches, MD Primary Gastroenterologist:  Jonette Eva, MD   Reason for Consultation:  Colitis  HPI: Catherine [name] is a 77 y.o. female admitted yesterday after presenting with abdominal distention, vomiting, diarrhea. Has been having back pain, feeling faint for several days. After pimento cheese sandwich, peach, diet coke yesterday for lunch and started having belching, pain under both ribs, abdominal swelling. No nausea until after contrast for CT. Vomited several times. Some diarrhea before coming to hospital. Two normal BMs yesterday morning. Then last night, loose stools, blood noted, ?vaginal area? Better this morning, never had vaginal bleeding before. No recent abx. No ill contacts. No cough. Some hot flashes. No BM today. Last colonoscopy remote. Cannot remember who did it, no records in [place].   Prior to Admission medications   Medication Sig Start Date End Date Taking? Authorizing Provider  CALCIUM PO Take 1 tablet by mouth daily.     Yes Historical Provider, MD  Cholecalciferol (VITAMIN D) 2000 UNITS CAPS Take 1 capsule by mouth every morning.     Yes Historical Provider, MD  citalopram (CELEXA) 40 MG tablet Take 40 mg by mouth daily. For mood    Yes Historical Provider, MD  dabigatran (PRADAXA) 150 MG CAPS Take 150 mg by mouth 2 (two) times daily.     Yes Historical Provider, MD  diltiazem (CARDIZEM CD) 240 MG 24 hr capsule Take 240 mg by mouth daily.     Yes Historical Provider, MD  furosemide (LASIX) 20 MG tablet Take 20 mg by mouth 2 (two) times daily.     Yes Historical Provider, MD  metFORMIN (GLUCOPHAGE) 1000 MG tablet Take 1,000 mg by mouth 2 (two) times daily with a meal.     Yes Historical Provider, MD  metoprolol succinate (TOPROL-XL) 25 MG 24 hr tablet Take 25 mg by mouth every morning.     Yes Historical Provider, MD  potassium chloride SA (K-DUR,KLOR-CON) 20 MEQ tablet Take 20 mEq by mouth daily.     Yes  Historical Provider, MD  simvastatin (ZOCOR) 20 MG tablet Take 10 mg by mouth at bedtime.     Yes Historical Provider, MD    Current Facility-Administered Medications  Medication Dose Route Frequency Provider Last Rate Last Dose  . 0.9 %  sodium chloride infusion   Intravenous Once Vida Roller, MD 75 mL/hr at 12/13/10 1951 1,000 mL at 12/13/10 1951  . 0.9 %  sodium chloride infusion   Intravenous Continuous Tesfaye Fanta 75 mL/hr at 12/14/10 0950 950 mL at 12/14/10 0950  . cholecalciferol (VITAMIN D) tablet 2,000 Units  2,000 Units Oral Daily Tesfaye Fanta   2,000 Units at 12/14/10 0951  . ciprofloxacin (CIPRO) IVPB 400 mg  400 mg Intravenous Once Vida Roller, MD   400 mg at 12/13/10 2141  . ciprofloxacin (CIPRO) IVPB 400 mg  400 mg Intravenous Q12H Tesfaye Fanta      . citalopram (CELEXA) tablet 40 mg  40 mg Oral Daily Tesfaye Fanta   40 mg at 12/14/10 0951  . dabigatran (PRADAXA) capsule 150 mg  150 mg Oral Daily Tesfaye Fanta   150 mg at 12/14/10 0951  . Flora-Q (FLORA-Q) Capsule 1 capsule  1 capsule Oral Daily Tana Coast, PA      . furosemide (LASIX) tablet 20 mg  20 mg Oral Daily Tesfaye Fanta   20 mg at 12/14/10 0951  . insulin aspart (novoLOG) injection 0-15 Units  0-15 Units  Subcutaneous TID WC Tesfaye Fanta      . insulin aspart (novoLOG) injection 0-5 Units  0-5 Units Subcutaneous QHS Tesfaye Fanta      . iohexol (OMNIPAQUE) 300 MG/ML injection 80 mL  80 mL Intravenous Once PRN Medication Radiologist   80 mL at 12/13/10 Sep 16, 2111  . metoprolol tartrate (LOPRESSOR) tablet 25 mg  25 mg Oral Daily Tesfaye Fanta   25 mg at 12/14/10 0951  . morphine injection 2 mg  2 mg Intravenous Once Vida Roller, MD   2 mg at 12/13/10 1954  . ondansetron (ZOFRAN) injection 4 mg  4 mg Intravenous Once Vida Roller, MD   4 mg at 12/13/10 09/16/03  . ondansetron (ZOFRAN) injection 4 mg  4 mg Intravenous Once Vida Roller, MD   4 mg at 12/13/10 15-Sep-2137  . potassium chloride SA (K-DUR,KLOR-CON) CR tablet  20 mEq  20 mEq Oral Daily Tesfaye Fanta   20 mEq at 12/14/10 0950  . simvastatin (ZOCOR) tablet 10 mg  10 mg Oral QHS Tesfaye Fanta        Allergies as of 12/13/2010 - Review Complete 12/13/2010  Allergen Reaction Noted  . Augmentin Diarrhea 12/13/2010    Past Medical History  Diagnosis Date  . CHF (congestive heart failure)   . Diabetes mellitus   . Hypertension   . COPD (chronic obstructive pulmonary disease)   . Vertigo   . Cancer     breast with mastectomy  . Stroke       ?  . Skin disorder      Epidermolysis bullosa.  . Pulmonary hypertension   . Atrial fibrillation 09/15/2009  . Multiple thyroid nodules     Past Surgical History  Procedure Date  . Cholecystectomy   . Hemorrhoid surgery   . Abdominal hysterectomy 1991    complete hysterectomy for cancer  . Mastectomy 2005    left breast, tamoxifin  . Goiter   . Hip fracture surgery 09/15/2008    right hip replacement  . Hip fracture surgery 09-15-04    left hip replacement  . Colonoscopy ?    remote    Family History  Problem Relation Age of Onset  . Colon cancer Neg Hx   . Heart attack Son     age 55s, suspected MI    History   Social History  . Marital Status: Widowed    Spouse Name: N/A    Number of Children: 1  . Years of Education: N/A   Occupational History  .     Social History Main Topics  . Smoking status: Never Smoker   . Smokeless tobacco: Not on file  . Alcohol Use: No  . Drug Use: No  . Sexually Active: No   Other Topics Concern  . Not on file   Social History Narrative   Son deceased suddenly in his 62s, 2009/09/15, ?MI.     ROS:  General: Negative for anorexia, weight loss. C/O sweats, weakness. Eyes: Negative for vision changes.  ENT: Negative for hoarseness, difficulty swallowing , nasal congestion. CV: Negative for chest pain, angina, palpitations, dyspnea on exertion, peripheral edema.  Respiratory: Negative for dyspnea at rest, dyspnea on exertion, cough, sputum, wheezing.  GI: See  history of present illness. GU:  Negative for dysuria, hematuria, urinary incontinence, urinary frequency, nocturnal urination.  MS: Negative for joint pain, low back pain.  Derm: Negative for rash or itching. Skin disorder with bullae lower extremities currently in remission.  Neuro: Negative for  weakness, abnormal sensation, seizure, frequent headaches, memory loss, confusion.  Psych: Negative for anxiety, depression, suicidal ideation, hallucinations.  Endo: Negative for unusual weight change.  Heme: Negative for bruising or bleeding. Allergy: Negative for rash or hives.       Physical Examination: Vital signs in last 24 hours: Temp:  [97.6 F (36.4 C)-98.9 F (37.2 C)] 97.9 F (36.6 C) (08/17 0600) Pulse Rate:  [62-84] 84  (08/17 0950) Resp:  [18-20] 18  (08/17 0600) BP: (109-126)/(49-75) 126/73 mmHg (08/17 0950) SpO2:  [90 %-98 %] 96 % (08/17 0600) Weight:  [135 lb (61.236 kg)-140 lb 3.4 oz (63.6 kg)] 140 lb 3.4 oz (63.6 kg) (08/17 0014) Last BM Date: 12/14/10  General: Well-nourished, well-developed in no acute distress. Slightly hard of hearing. Family at bedside. Head: Normocephalic, atraumatic.   Eyes: Conjunctiva pink, no icterus. Mouth: Oropharyngeal mucosa moist and pink , no lesions erythema or exudate. Neck: Supple without thyromegaly, masses, or lymphadenopathy.  Lungs: Clear to auscultation bilaterally.  Heart: Regular rate and rhythm, no murmurs rubs or gallops.  Abdomen: Bowel sounds are normal, abdomin slightly distended, nontender, no hepatosplenomegaly or masses, no abdominal bruits or hernia , no rebound or guarding.   Rectal: Not performed. Extremities: No lower extremity edema, clubbing, deformity. Scarring.  Neuro: Alert and oriented x 4 , grossly normal neurologically.  Skin: Warm and dry, no rash or jaundice.   Psych: Alert and cooperative, normal mood and affect.        Intake/Output from previous day: 08/16 0701 - 08/17 0700 In: 375  [I.V.:375] Out: 200 [Urine:200] Intake/Output this shift: I/O this shift: In: 360 [P.O.:360] Out: 400 [Urine:400]  Lab Results: CBC  Basename 12/13/10 1950  WBC 16.0*  HGB 12.0  HCT 35.6*  MCV 92.2  PLT 299   BMET  Basename 12/13/10 1950  NA 135  K 4.7  CL 94*  CO2 23  GLUCOSE 169*  BUN 22  CREATININE 1.36*  CALCIUM 10.7*   LFT  Basename 12/13/10 1950  BILITOT 0.3  BILIDIR --  IBILI --  ALKPHOS 68  AST 20  ALT 10  PROT 8.0  ALBUMIN 4.4    Imaging Studies: Ct Abdomen Pelvis W Contrast  12/13/2010  *RADIOLOGY REPORT*  Clinical Data: Abdominal pain.  Distention.  Diarrhea.  Diabetes. Congestive heart failure.  Hypertension.  CT ABDOMEN AND PELVIS WITH CONTRAST  Technique:  Multidetector CT imaging of the abdomen and pelvis was performed following the standard protocol during bolus administration of intravenous contrast.  Contrast: 80 ml Omnipaque-300  Comparison: 12/16/2009  Findings: There is linear subsegmental atelectasis or scarring in both lower lobes and along the left inferior major fissure.  A calcified granuloma is present in the right middle lobe inferiorly.  Calcification of the mitral valve is partially imaged.  There is a moderate sized hiatal hernia.  The gallbladder is surgically absent.  There is mild intrahepatic biliary dilatation.  Common bile duct measures up to 11 mm, likely representing physiologic dilatation.  A periampullary duodenal diverticulum is present.  Splenic artery atherosclerotic calcification is present. Dextroconvex thoracolumbar scoliosis is noted.  Fatty atrophy of the pancreas is present.  A 1.0 x 1.3 cm myelolipoma of the right adrenal gland is present on image 13 of series 2.  A 6 mm hypodense lesion in the right kidney upper pole on image 14 of series 7 is likely a cyst.  Atherosclerotic calcification of the abdominal aorta is noted.  No pathologic retroperitoneal or porta hepatis adenopathy is identified.  No pathologic pelvic  adenopathy is identified.  Bilateral hip implants are present.  The streak artifact obscures the lower pelvis.  By report the patient has had prior appendectomy, but there is a structure which looks like normal appendix in the right lower quadrant.  Sigmoid diverticulosis is present.  No active diverticulitis is identified although portions of sigmoid colon are obscured by streak artifact from the hip implants.  Air-fluid levels are present and mildly prominent colon and in mildly dilated loops of upper abdominal small bowel, query ileus. There are fluid levels in the distal colon compatible with diarrheal process.  Bony demineralization is present.  An L1 compression fracture is noted, age indeterminate, with 5 mm posterior bony retropulsion.  There is also a remote superior endplate compression fracture of T11.  Degenerative disc disease noted at L2-3 and L3-4.  IMPRESSION:  1.  Fluid filled colon with borderline distended loops of small bowel containing air fluid levels.  This may be a manifestation of enteritis/colitis given the underlying diarrheal process. 2.  Atherosclerosis. 3.  Periampullary duodenal diverticulum. 4.  L1 compression fractures age indeterminate and has 5 mm of posterior bony retropulsion.  Old T11 superior endplate compression fracture noted. 5.  Sigmoid diverticulosis. 6.  Calcification of the mitral valve. 7.  Moderate sized hiatal hernia. 8.  Small right adrenal myelolipoma.  Original Report Authenticated By: Dellia Cloud, M.D.  Pierre.Alas week]   Impression: Acute onset abdominal distention, upper abdominal pain with vomiting and some diarrhea. CT looks like ileus, ?secondary to UTI. Cannot r/o manifestation of enteritis/colitis. Improved this morning. On Cipro for UTI.  Plan: 1. Check stool studies, contact precautions per protocol. 2. Add Flora Q. 3. Management of ?vaginal bleeding per attending.  4. Supportive measures.    LOS: 1 day   Tana Coast  12/14/2010, 12:15  PM

## 2010-12-14 NOTE — H&P (Signed)
Catherine [name] MRN: [id number] DOB/AGE: 75-Apr-1925 75 y.o. Primary Care Physician:GOLDING,JOHN CABOT, MD Admit date: 12/13/2010 Chief Complaint: Abdominal pain and swelling HPI: This is an 77 year old who came to the emergency room because of abdominal discomfort. She was in her usual state of fair health at home when she developed abdominal discomfort almost immediately after eating her lunch. The pain came on somewhat gradually and she noticed that she had markedly swollen abdomen. She did not have diarrhea initially but did after she came to the emergency room. She denies any fever or chills but did have some nausea. She also noticed some bleeding from her vagina  Past Medical History  Diagnosis Date  . CHF (congestive heart failure)   . Diabetes mellitus   . Hypertension   . COPD (chronic obstructive pulmonary disease)   . Anemia   . Vertigo   . Cancer     breast with mastectomy   Past Surgical History  Procedure Date  . Cholecystectomy   . Hemorrhoid surgery   . Abdominal hysterectomy   . Mastectomy     left breast  . Goiter    She was hospitalized approximately a year ago with congestive heart failure     History reviewed. No pertinent family history. Her son died of a myocardial infarction Social History:  reports that she has never smoked. She does not have any smokeless tobacco history on file. She reports that she does not drink alcohol or use illicit drugs. She is a widow and lives at home alone  Allergies:  Allergies  Allergen Reactions  . Augmentin Diarrhea    Medications Prior to Admission  Medication Dose Route Frequency Provider Last Rate Last Dose  . 0.9 %  sodium chloride infusion   Intravenous Once Vida Roller, MD 75 mL/hr at 12/13/10 1951 1,000 mL at 12/13/10 1951  . 0.9 %  sodium chloride infusion   Intravenous Continuous Tesfaye Fanta 75 mL/hr at 12/14/10 0100    . cholecalciferol (VITAMIN D) tablet 2,000 Units  2,000 Units Oral Daily Tesfaye Fanta       . ciprofloxacin (CIPRO) IVPB 400 mg  400 mg Intravenous Once Vida Roller, MD   400 mg at 12/13/10 2141  . ciprofloxacin (CIPRO) IVPB 400 mg  400 mg Intravenous Q12H Tesfaye Fanta      . citalopram (CELEXA) tablet 40 mg  40 mg Oral Daily Tesfaye Fanta      . dabigatran (PRADAXA) capsule 150 mg  150 mg Oral Daily Tesfaye Fanta      . furosemide (LASIX) tablet 20 mg  20 mg Oral Daily Tesfaye Fanta      . insulin aspart (novoLOG) injection 0-15 Units  0-15 Units Subcutaneous TID WC Tesfaye Fanta      . insulin aspart (novoLOG) injection 0-5 Units  0-5 Units Subcutaneous QHS Tesfaye Fanta      . iohexol (OMNIPAQUE) 300 MG/ML injection 80 mL  80 mL Intravenous Once PRN Medication Radiologist   80 mL at 12/13/10 2113  . metoprolol tartrate (LOPRESSOR) tablet 25 mg  25 mg Oral Daily Tesfaye Fanta      . morphine injection 2 mg  2 mg Intravenous Once Vida Roller, MD   2 mg at 12/13/10 1954  . ondansetron (ZOFRAN) injection 4 mg  4 mg Intravenous Once Vida Roller, MD   4 mg at 12/13/10 2005  . ondansetron (ZOFRAN) injection 4 mg  4 mg Intravenous Once Vida Roller, MD   4 mg  at 12/13/10 2139  . potassium chloride SA (K-DUR,KLOR-CON) CR tablet 20 mEq  20 mEq Oral Daily Tesfaye Fanta      . simvastatin (ZOCOR) tablet 10 mg  10 mg Oral QHS Tesfaye Fanta       No current outpatient prescriptions on file as of 12/14/2010.       RUE:AVWUJ from the symptoms mentioned above,there are no other symptoms referable to all systems reviewed.  Physical Exam: Blood pressure 124/75, pulse 82, temperature 97.9 F (36.6 C), temperature source Oral, resp. rate 18, height 5\' 3"  (1.6 m), weight 63.6 kg (140 lb 3.4 oz), SpO2 96.00%. She is awake and alert. She says her abdomen is still somewhat uncomfortable but not like it was. Her HEENT shows her pupils are equal round reactive to light and accommodation. I did not do funduscopic examination. Her neck is supple without masses bruits or JVD. Her chest is  clear without wheezes rales or rhonchi. Her heart is regular without murmur gallop or rub. She has had a mastectomy. Her abdomen is distended bowel sounds are slightly hyperactive but she is nontender now. Her extremity showed no clubbing cyanosis or edema. Percent on nervous system examination is grossly intact Results for orders placed during the hospital encounter of 12/13/10 (from the past 48 hour(s))  LACTIC ACID, PLASMA     Status: Normal   Collection Time   12/13/10  7:35 PM      Component Value Range Comment   Lactic Acid, Venous 2.2  0.5 - 2.2 (mmol/L)   CBC     Status: Abnormal   Collection Time   12/13/10  7:50 PM      Component Value Range Comment   WBC 16.0 (*) 4.0 - 10.5 (K/uL)    RBC 3.86 (*) 3.87 - 5.11 (MIL/uL)    Hemoglobin 12.0  12.0 - 15.0 (g/dL)    HCT 81.1 (*) 91.4 - 46.0 (%)    MCV 92.2  78.0 - 100.0 (fL)    MCH 31.1  26.0 - 34.0 (pg)    MCHC 33.7  30.0 - 36.0 (g/dL)    RDW 78.2  95.6 - 21.3 (%)    Platelets 299  150 - 400 (K/uL)   DIFFERENTIAL     Status: Abnormal   Collection Time   12/13/10  7:50 PM      Component Value Range Comment   Neutrophils Relative 82 (*) 43 - 77 (%)    Neutro Abs 13.0 (*) 1.7 - 7.7 (K/uL)    Lymphocytes Relative 8 (*) 12 - 46 (%)    Lymphs Abs 1.3  0.7 - 4.0 (K/uL)    Monocytes Relative 9  3 - 12 (%)    Monocytes Absolute 1.5 (*) 0.1 - 1.0 (K/uL)    Eosinophils Relative 1  0 - 5 (%)    Eosinophils Absolute 0.1  0.0 - 0.7 (K/uL)    Basophils Relative 0  0 - 1 (%)    Basophils Absolute 0.0  0.0 - 0.1 (K/uL)   COMPREHENSIVE METABOLIC PANEL     Status: Abnormal   Collection Time   12/13/10  7:50 PM      Component Value Range Comment   Sodium 135  135 - 145 (mEq/L)    Potassium 4.7  3.5 - 5.1 (mEq/L)    Chloride 94 (*) 96 - 112 (mEq/L)    CO2 23  19 - 32 (mEq/L)    Glucose, Bld 169 (*) 70 - 99 (mg/dL)    BUN  22  6 - 23 (mg/dL)    Creatinine, Ser 1.61 (*) 0.50 - 1.10 (mg/dL)    Calcium 09.6 (*) 8.4 - 10.5 (mg/dL)    Total Protein  8.0  6.0 - 8.3 (g/dL)    Albumin 4.4  3.5 - 5.2 (g/dL)    AST 20  0 - 37 (U/L)    ALT 10  0 - 35 (U/L)    Alkaline Phosphatase 68  39 - 117 (U/L)    Total Bilirubin 0.3  0.3 - 1.2 (mg/dL)    GFR calc non Af Amer 37 (*) >60 (mL/min)    GFR calc Af Amer 45 (*) >60 (mL/min)   URINALYSIS, ROUTINE W REFLEX MICROSCOPIC     Status: Abnormal   Collection Time   12/13/10  8:20 PM      Component Value Range Comment   Color, Urine YELLOW  YELLOW     Appearance HAZY (*) CLEAR     Specific Gravity, Urine >1.030 (*) 1.005 - 1.030     pH 5.5  5.0 - 8.0     Glucose, UA NEGATIVE  NEGATIVE (mg/dL)    Hgb urine dipstick SMALL (*) NEGATIVE     Bilirubin Urine NEGATIVE  NEGATIVE     Ketones, ur TRACE (*) NEGATIVE (mg/dL)    Protein, ur TRACE (*) NEGATIVE (mg/dL)    Urobilinogen, UA 0.2  0.0 - 1.0 (mg/dL)    Nitrite NEGATIVE  NEGATIVE     Leukocytes, UA LARGE (*) NEGATIVE    URINE MICROSCOPIC-ADD ON     Status: Abnormal   Collection Time   12/13/10  8:20 PM      Component Value Range Comment   Squamous Epithelial / LPF FEW (*) RARE     WBC, UA TOO NUMEROUS TO COUNT  <3 (WBC/hpf)    RBC / HPF 3-6  <3 (RBC/hpf)    Bacteria, UA MANY (*) RARE    GLUCOSE, CAPILLARY     Status: Abnormal   Collection Time   12/14/10  8:06 AM      Component Value Range Comment   Glucose-Capillary 111 (*) 70 - 99 (mg/dL)    No results found for this or any previous visit (from the past 240 hour(s)).  Ct Abdomen Pelvis W Contrast  12/13/2010  *RADIOLOGY REPORT*  Clinical Data: Abdominal pain.  Distention.  Diarrhea.  Diabetes. Congestive heart failure.  Hypertension.  CT ABDOMEN AND PELVIS WITH CONTRAST  Technique:  Multidetector CT imaging of the abdomen and pelvis was performed following the standard protocol during bolus administration of intravenous contrast.  Contrast: 80 ml Omnipaque-300  Comparison: 12/16/2009  Findings: There is linear subsegmental atelectasis or scarring in both lower lobes and along the left inferior  major fissure.  A calcified granuloma is present in the right middle lobe inferiorly.  Calcification of the mitral valve is partially imaged.  There is a moderate sized hiatal hernia.  The gallbladder is surgically absent.  There is mild intrahepatic biliary dilatation.  Common bile duct measures up to 11 mm, likely representing physiologic dilatation.  A periampullary duodenal diverticulum is present.  Splenic artery atherosclerotic calcification is present. Dextroconvex thoracolumbar scoliosis is noted.  Fatty atrophy of the pancreas is present.  A 1.0 x 1.3 cm myelolipoma of the right adrenal gland is present on image 13 of series 2.  A 6 mm hypodense lesion in the right kidney upper pole on image 14 of series 7 is likely a cyst.  Atherosclerotic calcification of the abdominal  aorta is noted.  No pathologic retroperitoneal or porta hepatis adenopathy is identified.  No pathologic pelvic adenopathy is identified.  Bilateral hip implants are present.  The streak artifact obscures the lower pelvis.  By report the patient has had prior appendectomy, but there is a structure which looks like normal appendix in the right lower quadrant.  Sigmoid diverticulosis is present.  No active diverticulitis is identified although portions of sigmoid colon are obscured by streak artifact from the hip implants.  Air-fluid levels are present and mildly prominent colon and in mildly dilated loops of upper abdominal small bowel, query ileus. There are fluid levels in the distal colon compatible with diarrheal process.  Bony demineralization is present.  An L1 compression fracture is noted, age indeterminate, with 5 mm posterior bony retropulsion.  There is also a remote superior endplate compression fracture of T11.  Degenerative disc disease noted at L2-3 and L3-4.  IMPRESSION:  1.  Fluid filled colon with borderline distended loops of small bowel containing air fluid levels.  This may be a manifestation of enteritis/colitis given  the underlying diarrheal process. 2.  Atherosclerosis. 3.  Periampullary duodenal diverticulum. 4.  L1 compression fractures age indeterminate and has 5 mm of posterior bony retropulsion.  Old T11 superior endplate compression fracture noted. 5.  Sigmoid diverticulosis. 6.  Calcification of the mitral valve. 7.  Moderate sized hiatal hernia. 8.  Small right adrenal myelolipoma.  Original Report Authenticated By: Dellia Cloud, M.D.   Impression: She has what appears to be a urinary tract infection based on urinalysis. She had what appears to be colitis on CT. She is improved this morning. Active Problems:  * No active hospital problems. *      Plan: She will continue with her antibiotics to continue his IV fluids and I will asked for GI consultation considering the colitis seen on x-ray and her symptom complex      Murry Khiev L 12/14/2010, 8:46 AM

## 2010-12-15 LAB — BASIC METABOLIC PANEL
BUN: 17 mg/dL (ref 6–23)
CO2: 27 mEq/L (ref 19–32)
Chloride: 104 mEq/L (ref 96–112)
Creatinine, Ser: 1.03 mg/dL (ref 0.50–1.10)
Glucose, Bld: 151 mg/dL — ABNORMAL HIGH (ref 70–99)

## 2010-12-15 LAB — CBC
HCT: 30.6 % — ABNORMAL LOW (ref 36.0–46.0)
MCHC: 32.7 g/dL (ref 30.0–36.0)
MCV: 93.3 fL (ref 78.0–100.0)
RDW: 13.1 % (ref 11.5–15.5)
WBC: 4.7 10*3/uL (ref 4.0–10.5)

## 2010-12-15 LAB — DIFFERENTIAL
Basophils Absolute: 0 10*3/uL (ref 0.0–0.1)
Eosinophils Relative: 4 % (ref 0–5)
Lymphocytes Relative: 20 % (ref 12–46)
Monocytes Absolute: 0.7 10*3/uL (ref 0.1–1.0)

## 2010-12-15 LAB — GLUCOSE, CAPILLARY
Glucose-Capillary: 148 mg/dL — ABNORMAL HIGH (ref 70–99)
Glucose-Capillary: 149 mg/dL — ABNORMAL HIGH (ref 70–99)

## 2010-12-15 MED ORDER — POTASSIUM CHLORIDE CRYS ER 20 MEQ PO TBCR
20.0000 meq | EXTENDED_RELEASE_TABLET | Freq: Three times a day (TID) | ORAL | Status: DC
  Administered 2010-12-15 (×3): 20 meq via ORAL
  Filled 2010-12-15 (×2): qty 1

## 2010-12-15 MED ORDER — CITALOPRAM HYDROBROMIDE 20 MG PO TABS
20.0000 mg | ORAL_TABLET | Freq: Every day | ORAL | Status: DC
  Administered 2010-12-15 – 2010-12-18 (×4): 20 mg via ORAL
  Filled 2010-12-15 (×3): qty 1

## 2010-12-15 MED ORDER — METOPROLOL SUCCINATE ER 25 MG PO TB24
25.0000 mg | ORAL_TABLET | Freq: Every day | ORAL | Status: DC
  Administered 2010-12-15 – 2010-12-18 (×4): 25 mg via ORAL
  Filled 2010-12-15 (×4): qty 1

## 2010-12-15 NOTE — Progress Notes (Signed)
Coverage for [facility] (Dr. Darrick Penna).   Subjective; patient states her abdomen is still distended but it has decreased since yesterday she is passing flatus small amount at a time; she has some soreness mattering right upper quadrant; she has good appetite denies nausea; also denies dysuria urinating frequently. Objective; BP 139/80  Pulse 81  Temp(Src) 98 F (36.7 C) (Oral)  Resp 20  Ht 5\' 3"  (1.6 m)  Wt 140 lb 3.4 oz (63.6 kg)  BMI 24.84 kg/m2  SpO2 94% Abdomen is full; well sounds are hyperactive; mild generalized tenderness noted; no organomegaly or masses. No peripheral edema noted. Lab data; CT reviewed done on 12/13/2010. WBC 4.7 hemoglobin 10, hematocrit 30.6, platelet count 225k. Serum sodium 139, potassium 3.3, chloride 104, CO2 27, glucose 151, BUN 17, creatinine 1.03 Stool C. difficile by  PCR negative. Stool culture and O&P still not complete. Assessment; Suspect acute illness secondary to enterocolitis given acute  onset of dominant pain and loose stools ;  is doing better; she is on Cipro for UTI; it may also be helping with GI process.

## 2010-12-15 NOTE — Progress Notes (Signed)
NAME:  CHERREE, CONERLY                   ACCOUNT NO.:  [id number]  MEDICAL RECORD NO.:  [id number]  LOCATION:  A330                          FACILITY:  [facility]  PHYSICIAN:  Kingsley Callander. Ouida Sills, MD       DATE OF BIRTH:  05-Jan-1924  DATE OF PROCEDURE:  12/15/2010 DATE OF DISCHARGE:                                PROGRESS NOTE   Ms. Capito was admitted on Thursday night with a UTI and a possible colitis.  She has no urinary tract symptoms currently.  She denies any vomiting over the past day.  She is not having bowel movement over the past day either, but still feels some mild distention.  Her CT scan of the abdomen revealed a fluid-filled colon with borderline distended loops of small bowel containing air fluid levels.  This was felt to possibly be related to an enteritis/colitis.  PHYSICAL EXAMINATION:  VITAL SIGNS:  Normal this morning with a temperature of 98, pulse 81, respirations 20, and blood pressure of 139/80, oxygen saturation is 97% on supplemental oxygen which she uses chronically. LUNGS:  Reveal minimal crackles. HEART:  Regular with no murmurs. ABDOMEN:  Mildly distended with active bowel sounds and only minimal diffuse tenderness on palpation.  No palpable organomegaly.  Her hemoglobin has drifted down from 16 to 12-10.0.  Her white count is normalized from 16,000-4.7.  Her potassium is dropped from 4.7 to 3.3. BUN and creatinine are improved from 22 and 1.36 to 17 and 1.03. Calcium is dropped from 10.7 to 8.4.  She had a negative Clostridium difficile toxin.  IMPRESSION/PLAN: 1. Urinary tract infection.  Her urine culture remains pending.     Continue IV ciprofloxacin.  As stated, her white count is     normalized. 2. Possible colitis.  She has been started on Flora-Q.  She will be     seen by the gastroenterologist today.  This may possibly be related     to only an ileus, which will hopefully resolve with general     supportive care. 3. Diabetes.  Glucose was 141 this  morning.     Kingsley Callander. Ouida Sills, MD     ROF/MEDQ  D:  12/15/2010  T:  12/15/2010  Job:  [id number]

## 2010-12-16 LAB — BASIC METABOLIC PANEL
BUN: 15 mg/dL (ref 6–23)
Calcium: 8.9 mg/dL (ref 8.4–10.5)
Creatinine, Ser: 0.79 mg/dL (ref 0.50–1.10)
GFR calc non Af Amer: >60 mL/min (ref >60)
Glucose, Bld: 170 mg/dL — ABNORMAL HIGH (ref 70–99)
Sodium: 138 mEq/L (ref 135–145)

## 2010-12-16 LAB — GLUCOSE, CAPILLARY
Glucose-Capillary: 178 mg/dL — ABNORMAL HIGH (ref 70–99)
Glucose-Capillary: 187 mg/dL — ABNORMAL HIGH (ref 70–99)

## 2010-12-16 LAB — CBC
MCH: 31 pg (ref 26.0–34.0)
MCHC: 33.5 g/dL (ref 30.0–36.0)
Platelets: 226 10*3/uL (ref 150–400)
RBC: 3.48 MIL/uL — ABNORMAL LOW (ref 3.87–5.11)
RDW: 13.2 % (ref 11.5–15.5)

## 2010-12-16 MED ORDER — SODIUM CHLORIDE 0.9 % IJ SOLN
INTRAMUSCULAR | Status: AC
  Filled 2010-12-16: qty 10

## 2010-12-16 MED ORDER — POTASSIUM CHLORIDE CRYS ER 20 MEQ PO TBCR
20.0000 meq | EXTENDED_RELEASE_TABLET | Freq: Four times a day (QID) | ORAL | Status: DC
  Administered 2010-12-16 – 2010-12-18 (×9): 20 meq via ORAL
  Filled 2010-12-16 (×10): qty 1

## 2010-12-16 MED ORDER — ACETAMINOPHEN 325 MG PO TABS
650.0000 mg | ORAL_TABLET | Freq: Four times a day (QID) | ORAL | Status: DC | PRN
  Administered 2010-12-16: 650 mg via ORAL
  Filled 2010-12-16: qty 2

## 2010-12-16 NOTE — Progress Notes (Signed)
Subjective; patient states she is feeling better today; she finally had a bowel movement and it was formed stool; she's also been passing lots of flatus; she states her abdominal soreness is much less today; her appetite is good; denies nausea or vomiting Objective; BP 121/66  Pulse 69  Temp(Src) 97.9 F (36.6 C) (Oral)  Resp 20  Ht 5\' 3"  (1.6 m)  Wt 140 lb 3.4 oz (63.6 kg)  BMI 24.84 kg/m2  SpO2 92% She appears comfortable in bed. Her abdomen is much less distended than yesterday; Bowel sounds are normal; she has mild generalized tenderness without any guarding; No peripheral edema noted; Lab data; Stool culture and O&P is still not complete WBC 5.1, hemoglobin 10.8, hematocrit 32.2, platelet count 226K. Assessment; 1.Ileus;   most likely secondary to self-limiting enterocolitis or due to urinary tract infection has resolved. 2. Urinary tract infection; patient remains on Cipro; she is getting Flora Q she should continue for a couple weeks when she goes home. 3. mild anemia secondary to acute illness; no evidence of GI bleed and hemoglobin is coming up.

## 2010-12-16 NOTE — Progress Notes (Signed)
NAME:  JASMINNE, MEALY                   ACCOUNT NO.:  [id number]  MEDICAL RECORD NO.:  [id number]  LOCATION:  A330                          FACILITY:  [facility]  PHYSICIAN:  Kingsley Callander. Ouida Sills, MD       DATE OF BIRTH:  Dec 05, 1923  DATE OF PROCEDURE:  12/16/2010 DATE OF DISCHARGE:                                PROGRESS NOTE   Ms. Kutner complains of not feeling as well today.  She believes she sat up too long yesterday.  She has had some discomfort on the right side of her abdomen.  She has not had diarrhea or vomiting.  GENERAL:  She appears comfortable. VITAL SIGNS:  Temperature is 97.9 with a pulse of 87, respirations 20, blood pressure 173/84, and oxygen saturation 92%. LUNGS:  Clear. HEART:  Irregular. ABDOMEN:  Mildly tender on the right side with mild distention.  No palpable organomegaly.  IMPRESSION/PLAN: 1. Urinary tract infection:  Her culture remains pending.  Continue     ciprofloxacin.  Her white count is 5100. 2. Enterocolitis:  Again, vomiting and diarrhea have abated.  Her case     has been discussed with Gastroenterology.  Stool culture is     pending. 3. Diabetes:  Her glucose this morning is 178.  Metformin is being     held. 4. Atrial fibrillation.  Continue Pradaxa. 5. Congestive heart failure:  Stable on once daily Lasix. 6. Hypokalemia:  Serum potassium remains low at 3.3.  Continue     potassium supplementation, encourage to q.i.d.     Kingsley Callander. Ouida Sills, MD     ROF/MEDQ  D:  12/16/2010  T:  12/16/2010  Job:  [id number]

## 2010-12-17 DIAGNOSIS — K5289 Other specified noninfective gastroenteritis and colitis: Secondary | ICD-10-CM

## 2010-12-17 DIAGNOSIS — K560 Paralytic ileus: Secondary | ICD-10-CM

## 2010-12-17 LAB — GLUCOSE, CAPILLARY
Glucose-Capillary: 185 mg/dL — ABNORMAL HIGH (ref 70–99)
Glucose-Capillary: 186 mg/dL — ABNORMAL HIGH (ref 70–99)
Glucose-Capillary: 186 mg/dL — ABNORMAL HIGH (ref 70–99)

## 2010-12-17 LAB — BASIC METABOLIC PANEL
Calcium: 9.3 mg/dL (ref 8.4–10.5)
GFR calc Af Amer: >60 mL/min (ref >60)
GFR calc non Af Amer: >60 mL/min (ref >60)
Glucose, Bld: 177 mg/dL — ABNORMAL HIGH (ref 70–99)
Potassium: 3.7 mEq/L (ref 3.5–5.1)
Sodium: 137 mEq/L (ref 135–145)

## 2010-12-17 LAB — URINE CULTURE

## 2010-12-17 NOTE — Progress Notes (Signed)
Subjective: Pt notes 2 soft, brown BMs yesterday.  Denies N/V.  Mild right-sided abd pain.    Objective: Vital signs in last 24 hours: Temp:  [97.6 F (36.4 C)-98.3 F (36.8 C)] 97.6 F (36.4 C) (08/20 0600) Pulse Rate:  [69-84] 84  (08/20 0600) Resp:  [16-20] 16  (08/20 0600) BP: (121-153)/(66-84) 146/66 mmHg (08/20 0600) SpO2:  [96 %-98 %] 98 % (08/20 0600) Last BM Date: 12/16/10 General:   Alert,  Well-developed, well-nourished, pleasant and cooperative in NAD. Step-daughter @ bedside. Eyes:  Sclera clear, no icterus.   Conjunctiva pink. Mouth:  No deformity or lesions.  OP pink/moist. Heart:  Regular rate and rhythm Abdomen:  Soft and nondistended. No masses, hepatosplenomegaly or hernias noted. Normal bowel sounds, without guarding, and without rebound.  Mild Right mid-abd tenderness on palpation. Pulses:  Normal pulses noted. Extremities:  Without edema. Neurologic:  Alert and  oriented x4;  grossly normal neurologically. Skin:  Multiple ecchymosis on bilat lower exts Psych:  Alert and cooperative. Normal mood and affect.  Intake/Output from previous day: 08/19 0701 - 08/20 0700 In: 4347.7 [P.O.:600; I.V.:2547.7; IV Piggyback:1200] Out: 3202 [Urine:3200; Stool:2] Lab Results:  [facility] 12/16/10 0507 12/15/10 0409  WBC 5.1 4.7  HGB 10.8* 10.0*  HCT 32.2* 30.6*  PLT 226 225   BMET  Basename 12/17/10 0521 12/16/10 0507 12/15/10 0409  NA 137 138 139  K 3.7 3.3* 3.3*  CL 102 102 104  CO2 23 27 27   GLUCOSE 177* 170* 151*  BUN 17 15 17   CREATININE 0.86 0.79 1.03  CALCIUM 9.3 8.9 8.4  C-Diff PCR negative  Assessment: #1  Ileus:  Likely secondary to self-limiting enterocolitis.  Resolved.  Tolerating po diet well.   #2  UTI:  On Cipro # 3  Anemia:  Mild.  Suspect secondary to acute illness/dilutional.  No evidence of GI bleed.    Plan: Continue supportive measures Continue probiotics daily for at least 1 month (Flora Q)    LOS: 4 days   Lorenza Burton   12/17/2010, 8:51 AM  Pt seen and examined; agree with above assessment and recommendations.

## 2010-12-17 NOTE — Progress Notes (Signed)
NAME:  Catherine [name], Catherine [name]                   ACCOUNT NO.:  [id number]  MEDICAL RECORD NO.:  [id number]  LOCATION:  A330                          FACILITY:  [facility]  PHYSICIAN:  Kingsley Callander. Ouida Sills, MD       DATE OF BIRTH:  [birth year]  DATE OF PROCEDURE:  12/17/2010 DATE OF DISCHARGE:                                PROGRESS NOTE   Catherine [name] feels better today.  She had two bowel movements yesterday.  She denies any vomiting.  She has not had fever.  PHYSICAL EXAMINATION:  VITAL SIGNS:  Temperature is 97.6 with a pulse of 84, respirations 16 and blood pressure 146/66, oxygen is 90% on supplemental nasal O2. LUNGS:  Clear. HEART:  Irregular. ABDOMEN:  Soft and nontender.  IMPRESSION/PLAN: 1. Urinary tract infection, improved.  Continue ciprofloxacin.  Urine     culture reveals greater than 1000 colonies of Escherichia coli,     sensitivities are pending.  Her stool culture reveals no suspicious     colonies.  On December 17, 2010, blood culture is negative. 2. Ileus, improving.  She will be continued on Flora-Q. 3. Hypokalemia.  Serum potassium is normalized to 3.7 with     supplementation. 4. Diabetes.  Glucose is 177. 5. Mild anemia.  Hemoglobin is 10.8 as of yesterday and there is no     evidence of blood loss at this point.     Kingsley Callander. Ouida Sills, MD     ROF/MEDQ  D:  12/17/2010  T:  12/17/2010  Job:  [id number]

## 2010-12-18 LAB — STOOL CULTURE

## 2010-12-18 MED ORDER — CITALOPRAM HYDROBROMIDE 40 MG PO TABS: 20.0000 mg | ORAL_TABLET | Freq: Every day | ORAL | Status: DC

## 2010-12-18 MED ORDER — CIPROFLOXACIN HCL 500 MG PO TABS: 500.0000 mg | ORAL_TABLET | Freq: Two times a day (BID) | ORAL | Status: AC

## 2010-12-18 MED ORDER — FLORA-Q PO CAPS: 1.0000 | ORAL_CAPSULE | Freq: Every day | ORAL | Status: DC

## 2010-12-18 MED ORDER — BIOTENE DRY MOUTH MT LIQD: OROMUCOSAL | Status: DC | PRN

## 2010-12-18 MED ORDER — METOPROLOL SUCCINATE ER 25 MG PO TB24: 25.0000 mg | ORAL_TABLET | Freq: Every day | ORAL | Status: DC

## 2010-12-18 NOTE — ED Notes (Signed)
+   Urine culture Patient treated with cipro-sensitive to same-chart appended per protocol Md

## 2010-12-18 NOTE — Discharge Summary (Signed)
NAME:  Catherine [name], Catherine [name]                   ACCOUNT NO.:  [id number]  MEDICAL RECORD NO.:  [id number]  LOCATION:  A330                          FACILITY:  [facility]  PHYSICIAN:  Kingsley Callander. Ouida Sills, MD       DATE OF BIRTH:  [birth year]  DATE OF ADMISSION:  12/13/2010 DATE OF DISCHARGE:  LH                              DISCHARGE SUMMARY   DISCHARGE DIAGNOSES: 1. Escherichia coli, urinary tract infection. 2. Ileus. 3. Type 2 diabetes. 4. Congestive heart failure. 5. Atrial fibrillation. 6. Hypertension. 7. History of breast cancer. 8. Depression. 9. Pulmonary hypertension.  DISCHARGE MEDICATIONS: 1. Ciprofloxacin 500 mg b.i.d. for five more days. 2. Citalopram 20 mg daily. 3. Diltiazem CD 240 mg daily. 4. Flora-Q daily. 5. Lasix 20 mg b.i.d. 6. Metformin 1000 mg b.i.d. 7. Metoprolol XL 25 mg daily. 8. Potassium 20 mEq daily. 9. Pradaxa 150 mg daily. 10.Simvastatin 10 mg daily. 11.Vitamin D 2000 units daily.  HOSPITAL COURSE:  This patient is an 77 year old female, who presented to the emergency room with abdominal pain and distention.  She had experienced diarrhea.  Initially, white count was 16,000, lactic acid level of 2.2, hemoglobin was 12, BUN and creatinine were 22 and 1.36 with a serum potassium of 4.7.  Calcium was mildly elevated initially at 10.7.  LFTs were normal.  Urinalysis revealed too numerous to count white cells and many bacteria.  She underwent a CT scan of the abdomen in the emergency room which was felt to possibly be consistent with colitis.  She was hospitalized and evaluated by Gastroenterology.  She was felt to likely have an ileus related to her urinary tract infection.  Her urinary tract infection was treated with IV Cipro.  Stool studies revealed a negative Clostridium difficile toxin and negative Giardia and Cryptosporidium.  Screen her culture was negative.  Her urine culture revealed greater than 100,000 E. coli which was hardly sensitive.  Her symptoms of  abdominal discomfort and distention improved.  Her diet was advanced without difficulty.  She was felt to be improved and stable for discharge on the 21st.  She will have follow-up in my office in 1 week.  She will continue oral ciprofloxacin for another 5 days.  Flora-Q has been added daily for her GI tract.  Her metformin has been held during her hospitalization and will be restarted at discharge.  She has remained stable from a cardiovascular standpoint and is anticoagulated chronically with Pradaxa for atrial fib.  She is chronically on oxygen for pulmonary hypertension and has good oxygenation at this point.     Kingsley Callander. Ouida Sills, MD     ROF/MEDQ  D:  12/18/2010  T:  12/18/2010  Job:  [id number]

## 2010-12-21 LAB — GLUCOSE, CAPILLARY: Glucose-Capillary: 156 mg/dL — ABNORMAL HIGH (ref 70–99)

## 2010-12-26 LAB — GIARDIA/CRYPTOSPORIDIUM SCREEN(EIA)

## 2011-06-07 ENCOUNTER — Other Ambulatory Visit (HOSPITAL_COMMUNITY): Payer: Self-pay | Admitting: Orthopaedic Surgery

## 2011-06-07 DIAGNOSIS — S32000A Wedge compression fracture of unspecified lumbar vertebra, initial encounter for closed fracture: Secondary | ICD-10-CM

## 2011-06-12 ENCOUNTER — Encounter (HOSPITAL_COMMUNITY)
Admission: RE | Admit: 2011-06-12 | Discharge: 2011-06-12 | Disposition: A | Discharge status: Home / Self Care | Payer: Medicare Other | Source: Ambulatory Visit | Attending: Orthopaedic Surgery | Admitting: Orthopaedic Surgery

## 2011-06-12 ENCOUNTER — Encounter (HOSPITAL_COMMUNITY): Payer: Self-pay

## 2011-06-12 ENCOUNTER — Ambulatory Visit (HOSPITAL_COMMUNITY)
Admission: RE | Admit: 2011-06-12 | Discharge: 2011-06-12 | Disposition: A | Discharge status: Home / Self Care | Payer: Medicare Other | Source: Ambulatory Visit | Attending: Interventional Radiology | Admitting: Interventional Radiology

## 2011-06-12 DIAGNOSIS — M549 Dorsalgia, unspecified: Secondary | ICD-10-CM | POA: Insufficient documentation

## 2011-06-12 DIAGNOSIS — Z853 Personal history of malignant neoplasm of breast: Secondary | ICD-10-CM | POA: Insufficient documentation

## 2011-06-12 DIAGNOSIS — M545 Low back pain, unspecified: Secondary | ICD-10-CM | POA: Insufficient documentation

## 2011-06-12 DIAGNOSIS — M542 Cervicalgia: Secondary | ICD-10-CM | POA: Insufficient documentation

## 2011-06-12 DIAGNOSIS — M5137 Other intervertebral disc degeneration, lumbosacral region: Secondary | ICD-10-CM | POA: Insufficient documentation

## 2011-06-12 DIAGNOSIS — M51379 Other intervertebral disc degeneration, lumbosacral region without mention of lumbar back pain or lower extremity pain: Secondary | ICD-10-CM | POA: Insufficient documentation

## 2011-06-12 DIAGNOSIS — M8448XA Pathological fracture, other site, initial encounter for fracture: Secondary | ICD-10-CM | POA: Insufficient documentation

## 2011-06-12 DIAGNOSIS — R937 Abnormal findings on diagnostic imaging of other parts of musculoskeletal system: Secondary | ICD-10-CM | POA: Insufficient documentation

## 2011-06-12 DIAGNOSIS — S32000A Wedge compression fracture of unspecified lumbar vertebra, initial encounter for closed fracture: Secondary | ICD-10-CM

## 2011-06-12 MED ORDER — TECHNETIUM TC 99M MEDRONATE IV KIT
25.0000 | PACK | Freq: Once | INTRAVENOUS | Status: AC | PRN
  Administered 2011-06-12: 26 via INTRAVENOUS

## 2011-08-12 ENCOUNTER — Other Ambulatory Visit (HOSPITAL_COMMUNITY): Payer: Self-pay | Admitting: Internal Medicine

## 2011-08-12 DIAGNOSIS — Z139 Encounter for screening, unspecified: Secondary | ICD-10-CM

## 2011-08-15 ENCOUNTER — Ambulatory Visit (HOSPITAL_COMMUNITY)
Admission: RE | Admit: 2011-08-15 | Discharge: 2011-08-15 | Disposition: A | Discharge status: Home / Self Care | Payer: Medicare Other | Source: Ambulatory Visit | Attending: Internal Medicine | Admitting: Internal Medicine

## 2011-08-15 DIAGNOSIS — Z139 Encounter for screening, unspecified: Secondary | ICD-10-CM

## 2011-08-15 DIAGNOSIS — N6459 Other signs and symptoms in breast: Secondary | ICD-10-CM | POA: Insufficient documentation

## 2011-08-15 DIAGNOSIS — Z1231 Encounter for screening mammogram for malignant neoplasm of breast: Secondary | ICD-10-CM | POA: Insufficient documentation

## 2011-08-19 ENCOUNTER — Other Ambulatory Visit: Payer: Self-pay | Admitting: Internal Medicine

## 2011-08-19 DIAGNOSIS — R928 Other abnormal and inconclusive findings on diagnostic imaging of breast: Secondary | ICD-10-CM

## 2011-08-28 ENCOUNTER — Ambulatory Visit (HOSPITAL_COMMUNITY)
Admission: RE | Admit: 2011-08-28 | Discharge: 2011-08-28 | Disposition: A | Discharge status: Home / Self Care | Payer: Medicare Other | Source: Ambulatory Visit | Attending: Internal Medicine | Admitting: Internal Medicine

## 2011-08-28 DIAGNOSIS — N63 Unspecified lump in unspecified breast: Secondary | ICD-10-CM | POA: Insufficient documentation

## 2011-08-28 DIAGNOSIS — R928 Other abnormal and inconclusive findings on diagnostic imaging of breast: Secondary | ICD-10-CM

## 2012-06-09 ENCOUNTER — Encounter (HOSPITAL_COMMUNITY): Payer: Medicare Other | Attending: Oncology | Admitting: Oncology

## 2012-06-09 ENCOUNTER — Encounter (HOSPITAL_COMMUNITY): Payer: Self-pay | Admitting: Oncology

## 2012-06-09 VITALS — BP 105/65 | HR 67 | Temp 97.6°F | Resp 18 | Ht 62.0 in | Wt 118.6 lb

## 2012-06-09 DIAGNOSIS — E119 Type 2 diabetes mellitus without complications: Secondary | ICD-10-CM | POA: Insufficient documentation

## 2012-06-09 DIAGNOSIS — D638 Anemia in other chronic diseases classified elsewhere: Secondary | ICD-10-CM

## 2012-06-09 DIAGNOSIS — I4891 Unspecified atrial fibrillation: Secondary | ICD-10-CM | POA: Insufficient documentation

## 2012-06-09 DIAGNOSIS — L280 Lichen simplex chronicus: Secondary | ICD-10-CM | POA: Insufficient documentation

## 2012-06-09 DIAGNOSIS — D472 Monoclonal gammopathy: Secondary | ICD-10-CM | POA: Insufficient documentation

## 2012-06-09 DIAGNOSIS — Z853 Personal history of malignant neoplasm of breast: Secondary | ICD-10-CM

## 2012-06-09 DIAGNOSIS — I509 Heart failure, unspecified: Secondary | ICD-10-CM | POA: Insufficient documentation

## 2012-06-09 DIAGNOSIS — D649 Anemia, unspecified: Secondary | ICD-10-CM

## 2012-06-09 DIAGNOSIS — M810 Age-related osteoporosis without current pathological fracture: Secondary | ICD-10-CM | POA: Insufficient documentation

## 2012-06-09 LAB — CBC WITH DIFFERENTIAL/PLATELET
Eosinophils Absolute: 0.3 10*3/uL (ref 0.0–0.7)
Eosinophils Relative: 7 % — ABNORMAL HIGH (ref 0–5)
HCT: 29.5 % — ABNORMAL LOW (ref 36.0–46.0)
Hemoglobin: 9.5 g/dL — ABNORMAL LOW (ref 12.0–15.0)
Lymphocytes Relative: 14 % (ref 12–46)
Lymphs Abs: 0.6 10*3/uL — ABNORMAL LOW (ref 0.7–4.0)
MCH: 32.4 pg (ref 26.0–34.0)
MCV: 100.7 fL — ABNORMAL HIGH (ref 78.0–100.0)
Monocytes Relative: 11 % (ref 3–12)
RBC: 2.93 MIL/uL — ABNORMAL LOW (ref 3.87–5.11)

## 2012-06-09 LAB — COMPREHENSIVE METABOLIC PANEL
ALT: 35 U/L (ref 0–35)
Albumin: 3.6 g/dL (ref 3.5–5.2)
Alkaline Phosphatase: 170 U/L — ABNORMAL HIGH (ref 39–117)
BUN: 34 mg/dL — ABNORMAL HIGH (ref 6–23)
Chloride: 101 mEq/L (ref 96–112)
GFR calc Af Amer: 40 mL/min — ABNORMAL LOW (ref >90)
Glucose, Bld: 200 mg/dL — ABNORMAL HIGH (ref 70–99)
Potassium: 4.6 mEq/L (ref 3.5–5.1)
Sodium: 138 mEq/L (ref 135–145)
Total Bilirubin: 0.5 mg/dL (ref 0.3–1.2)

## 2012-06-09 MED ORDER — DARBEPOETIN ALFA-POLYSORBATE 200 MCG/0.4ML IJ SOLN
200.0000 ug | Freq: Once | INTRAMUSCULAR | Status: AC
  Administered 2012-06-09: 200 ug via SUBCUTANEOUS

## 2012-06-09 MED ORDER — DARBEPOETIN ALFA-POLYSORBATE 200 MCG/0.4ML IJ SOLN
INTRAMUSCULAR | Status: AC
  Filled 2012-06-09: qty 0.4

## 2012-06-09 NOTE — Progress Notes (Signed)
Catherine [name] presented for labwork. Labs per MD order drawn via Peripheral Line 23 gauge needle inserted in right AC  Good blood return present. Procedure without incident.  Needle removed intact. Patient tolerated procedure well.  Catherine [name] presents today for injection per MD orders. Aranesp 200 mcg administered SQ in right Abdomen. Administration without incident. Patient tolerated well.    

## 2012-06-09 NOTE — Patient Instructions (Addendum)
[facility] Discharge Instructions  RECOMMENDATIONS MADE BY THE CONSULTANT AND ANY TEST RESULTS WILL BE SENT TO YOUR REFERRING PHYSICIAN.  EXAM FINDINGS BY THE PHYSICIAN TODAY AND SIGNS OR SYMPTOMS TO REPORT TO CLINIC OR PRIMARY PHYSICIAN:  Exam and discussion by MD.  We will check some blood work today and will start you on Aranesp if your hemoglobin is low.  MEDICATIONS PRESCRIBED:  none  INSTRUCTIONS GIVEN AND DISCUSSED: Report increased fatigue, shortness of breath or other problems.  SPECIAL INSTRUCTIONS/FOLLOW-UP: Blood work and aranesp every 3 weeks and to see [facility] in 3 months.  Thank you for choosing [facility] to provide your oncology and hematology care.  To afford each patient quality time with our providers, please arrive at least 15 minutes before your scheduled appointment time.  With your help, our goal is to use those 15 minutes to complete the necessary work-up to ensure our physicians have the information they need to help with your evaluation and healthcare recommendations.    Effective January 1st, 2014, we ask that you re-schedule your appointment with our physicians should you arrive 10 or more minutes late for your appointment.  We strive to give you quality time with our providers, and arriving late affects you and other patients whose appointments are after yours.    Again, thank you for choosing [facility].  Our hope is that these requests will decrease the amount of time that you wait before being seen by our physicians.       _____________________________________________________________  Should you have questions after your visit to [facility], please contact our office at [phone] between the hours of 8:30 a.m. and 5:00 p.m.  Voicemails left after 4:30 p.m. will not be returned until the following business day.  For prescription refill requests, have your pharmacy contact our office with  your prescription refill request.

## 2012-06-09 NOTE — Progress Notes (Signed)
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 She thought she was going to have to have surgery but it got better with conservative therapy. She is now back in the [place] area in a nursing home. She had been given monthly Zometa for this osteoporosis without a diagnosis of myeloma. She is here today for transfer of care. She declined a bone marrow aspirate and biopsy while the [facility]. She has not had lytic bone lesions, hypercalcemia, or a severely elevated total protein, or monoclonal spike.  She is here today with her niece and a worker at the nursing home. The patient has on oxygen. She actually has good color to her conjunctiva. She has good color to her nail. She has decreased breath sounds and a few rales at both lung bases. They did not clear with coughing. She has a S3 gallop. She has a regular rate today and regular rhythm today. I could not appreciate a day and today she has no hepatosplenomegaly. Bowel sounds are diminished. She has 1+ to 2+ edema the right leg and trace edema the left leg. She is a bandage wrap around her right ankle which I did not remove. She has eye changes of prior cataract operations. Her throat is clear. She has several neurodermatitis lesions on her back. She has a negative left chest wall and a negative right breast exam.  She had been receiving Aranesp for her anemia. Recently her hemoglobin had been quite good and the Aranesp was held. She needs  followup of her counts and her proteins in her serum. She has not been interested in chemotherapy should she have myeloma. We will see her back. From the standpoint of her osteoporosis Zometa is not indicated monthly therefore I will initiate denosumab every 6 months

## 2012-06-10 LAB — KAPPA/LAMBDA LIGHT CHAINS: Kappa free light chain: 5.98 mg/dL — ABNORMAL HIGH (ref 0.33–1.94)

## 2012-06-12 LAB — MULTIPLE MYELOMA PANEL, SERUM
Albumin ELP: 53 % — ABNORMAL LOW (ref 55.8–66.1)
Alpha-2-Globulin: 11.2 % (ref 7.1–11.8)
Beta 2: 4.4 % (ref 3.2–6.5)
Beta Globulin: 5.2 % (ref 4.7–7.2)
IgA: 269 mg/dL (ref 69–380)
IgM, Serum: 62 mg/dL (ref 52–322)
Total Protein: 6.7 g/dL (ref 6.0–8.3)

## 2012-06-15 ENCOUNTER — Encounter (HOSPITAL_COMMUNITY): Payer: Self-pay | Admitting: Dietician

## 2012-06-15 NOTE — Progress Notes (Signed)
Pt was identified on [facility] Weight Loss Screen.  Chart reviewed. Pt resides at a local nursing home in [place].   Wt Readings from Last 10 Encounters:  06/09/12 118 lb 9.6 oz (53.797 kg)  12/14/10 140 lb 3.4 oz (63.6 kg)   Noted 22# (15.7%) wt loss x 1.5 years. This is not significant. Also noted pt has hx of CHF and edema, which may be contributing factors to wt loss.  No recommendations or nutrition interventions at this this time. Will defer nutrition POC to local nursing home.  Please consult if further nutrition issues arise.   Melody Haver, RD, LDN Pager: [phone]

## 2012-06-30 ENCOUNTER — Encounter (HOSPITAL_COMMUNITY): Payer: Medicare Other | Attending: Oncology

## 2012-06-30 ENCOUNTER — Encounter (HOSPITAL_COMMUNITY): Payer: Medicare Other

## 2012-06-30 VITALS — BP 114/66 | HR 74

## 2012-06-30 DIAGNOSIS — D638 Anemia in other chronic diseases classified elsewhere: Secondary | ICD-10-CM | POA: Insufficient documentation

## 2012-06-30 LAB — CBC
HCT: 34.2 % — ABNORMAL LOW (ref 36.0–46.0)
Hemoglobin: 10.7 g/dL — ABNORMAL LOW (ref 12.0–15.0)
MCHC: 31.3 g/dL (ref 30.0–36.0)
RBC: 3.28 MIL/uL — ABNORMAL LOW (ref 3.87–5.11)
WBC: 4.7 10*3/uL (ref 4.0–10.5)

## 2012-06-30 MED ORDER — DARBEPOETIN ALFA-POLYSORBATE 200 MCG/0.4ML IJ SOLN
INTRAMUSCULAR | Status: AC
  Filled 2012-06-30: qty 0.4

## 2012-06-30 MED ORDER — DARBEPOETIN ALFA-POLYSORBATE 200 MCG/0.4ML IJ SOLN
200.0000 ug | Freq: Once | INTRAMUSCULAR | Status: AC
  Administered 2012-06-30: 200 ug via SUBCUTANEOUS

## 2012-06-30 NOTE — Progress Notes (Signed)
Catherine [name] presents today for injection per MD orders. Aranesp administered SQ in left Abdomen. Administration without incident. Patient tolerated well.

## 2012-07-21 ENCOUNTER — Other Ambulatory Visit (HOSPITAL_COMMUNITY): Payer: Medicare Other

## 2012-07-21 ENCOUNTER — Ambulatory Visit (HOSPITAL_COMMUNITY): Payer: Medicare Other

## 2012-07-21 ENCOUNTER — Encounter (HOSPITAL_BASED_OUTPATIENT_CLINIC_OR_DEPARTMENT_OTHER): Payer: Medicare Other

## 2012-07-21 DIAGNOSIS — D638 Anemia in other chronic diseases classified elsewhere: Secondary | ICD-10-CM

## 2012-07-21 DIAGNOSIS — D649 Anemia, unspecified: Secondary | ICD-10-CM

## 2012-07-21 LAB — CBC
HCT: 34.8 % — ABNORMAL LOW (ref 36.0–46.0)
Hemoglobin: 11.1 g/dL — ABNORMAL LOW (ref 12.0–15.0)
MCH: 32.8 pg (ref 26.0–34.0)
MCV: 103 fL — ABNORMAL HIGH (ref 78.0–100.0)
Platelets: 168 10*3/uL (ref 150–400)
RBC: 3.38 MIL/uL — ABNORMAL LOW (ref 3.87–5.11)
WBC: 4.7 10*3/uL (ref 4.0–10.5)

## 2012-07-21 LAB — COMPREHENSIVE METABOLIC PANEL
AST: 29 U/L (ref 0–37)
CO2: 27 mEq/L (ref 19–32)
Calcium: 9.5 mg/dL (ref 8.4–10.5)
Chloride: 104 mEq/L (ref 96–112)
Creatinine, Ser: 1.44 mg/dL — ABNORMAL HIGH (ref 0.50–1.10)
GFR calc Af Amer: 37 mL/min — ABNORMAL LOW (ref >90)
GFR calc non Af Amer: 32 mL/min — ABNORMAL LOW (ref >90)
Glucose, Bld: 86 mg/dL (ref 70–99)
Total Bilirubin: 0.5 mg/dL (ref 0.3–1.2)

## 2012-07-21 NOTE — Progress Notes (Signed)
   Catherine [name] presented for labwork. Labs per MD order drawn via Peripheral Line 25 gauge needle inserted in rt ac.  Good blood return present. Procedure without incident.  Needle removed intact. Patient tolerated procedure well.  aranesp held due to hgb = 11.1

## 2012-07-22 ENCOUNTER — Other Ambulatory Visit (HOSPITAL_COMMUNITY): Payer: Self-pay | Admitting: Oncology

## 2012-08-11 ENCOUNTER — Encounter (HOSPITAL_COMMUNITY): Payer: Medicare Other | Attending: Oncology

## 2012-08-11 ENCOUNTER — Encounter (HOSPITAL_BASED_OUTPATIENT_CLINIC_OR_DEPARTMENT_OTHER): Payer: Medicare Other

## 2012-08-11 VITALS — BP 112/72 | HR 72

## 2012-08-11 DIAGNOSIS — M810 Age-related osteoporosis without current pathological fracture: Secondary | ICD-10-CM

## 2012-08-11 DIAGNOSIS — D638 Anemia in other chronic diseases classified elsewhere: Secondary | ICD-10-CM

## 2012-08-11 LAB — CBC
MCH: 32.2 pg (ref 26.0–34.0)
MCHC: 31.9 g/dL (ref 30.0–36.0)
Platelets: 121 10*3/uL — ABNORMAL LOW (ref 150–400)
RBC: 3.39 MIL/uL — ABNORMAL LOW (ref 3.87–5.11)

## 2012-08-11 MED ORDER — DARBEPOETIN ALFA-POLYSORBATE 200 MCG/0.4ML IJ SOLN
INTRAMUSCULAR | Status: AC
  Filled 2012-08-11: qty 0.4

## 2012-08-11 MED ORDER — DARBEPOETIN ALFA-POLYSORBATE 200 MCG/0.4ML IJ SOLN
200.0000 ug | Freq: Once | INTRAMUSCULAR | Status: AC
  Administered 2012-08-11: 200 ug via SUBCUTANEOUS

## 2012-08-11 MED ORDER — DENOSUMAB 60 MG/ML ~~LOC~~ SOLN
60.0000 mg | Freq: Once | SUBCUTANEOUS | Status: AC
  Administered 2012-08-11: 60 mg via SUBCUTANEOUS
  Filled 2012-08-11: qty 1

## 2012-08-11 NOTE — Progress Notes (Signed)
Labs drawn today for cbc 

## 2012-08-11 NOTE — Progress Notes (Signed)
Hgb 10.9. Aranesp 200 mcg given in lower right abd, and Prolia 60 mg given sub-q in lower right abd.  Tolerated well.

## 2012-09-01 ENCOUNTER — Ambulatory Visit (HOSPITAL_COMMUNITY): Payer: Medicare Other

## 2012-09-01 ENCOUNTER — Encounter (HOSPITAL_COMMUNITY): Payer: Medicare Other | Attending: Oncology

## 2012-09-01 DIAGNOSIS — D649 Anemia, unspecified: Secondary | ICD-10-CM

## 2012-09-01 DIAGNOSIS — D638 Anemia in other chronic diseases classified elsewhere: Secondary | ICD-10-CM | POA: Insufficient documentation

## 2012-09-01 LAB — CBC
Hemoglobin: 11.9 g/dL — ABNORMAL LOW (ref 12.0–15.0)
MCH: 32.5 pg (ref 26.0–34.0)
RBC: 3.66 MIL/uL — ABNORMAL LOW (ref 3.87–5.11)

## 2012-09-01 NOTE — Progress Notes (Signed)
Catherine [name] presented for labwork. Labs per MD order drawn via Peripheral Line 23 gauge needle inserted in right AC  Good blood return present. Procedure without incident.  Needle removed intact. Patient tolerated procedure well. Hemoglobin 11.8.  Aranesp not given.

## 2012-09-07 ENCOUNTER — Ambulatory Visit (HOSPITAL_COMMUNITY): Payer: Medicare Other | Admitting: Oncology

## 2012-09-22 ENCOUNTER — Other Ambulatory Visit (HOSPITAL_COMMUNITY): Payer: Medicare Other

## 2012-09-23 ENCOUNTER — Encounter (HOSPITAL_COMMUNITY): Payer: Medicare Other

## 2012-09-23 DIAGNOSIS — D638 Anemia in other chronic diseases classified elsewhere: Secondary | ICD-10-CM

## 2012-09-23 LAB — CBC
HCT: 36.6 % (ref 36.0–46.0)
MCV: 100.5 fL — ABNORMAL HIGH (ref 78.0–100.0)
RBC: 3.64 MIL/uL — ABNORMAL LOW (ref 3.87–5.11)
WBC: 3.6 10*3/uL — ABNORMAL LOW (ref 4.0–10.5)

## 2012-09-23 NOTE — Progress Notes (Unsigned)
Tolerated peripheral stick well.

## 2012-09-23 NOTE — Progress Notes (Signed)
Specimen obtained for cbc from right ac.  Tolerated well. Hgb 11.6.  No need for injection today.

## 2012-10-05 ENCOUNTER — Encounter (HOSPITAL_COMMUNITY): Payer: Self-pay | Admitting: *Deleted

## 2012-10-05 ENCOUNTER — Inpatient Hospital Stay (HOSPITAL_COMMUNITY)
Admission: EM | Admit: 2012-10-05 | Discharge: 2012-10-09 | DRG: 293 | Disposition: A | Discharge status: Nursing Home (Includes ICF) | Payer: Medicare Other | Attending: Internal Medicine | Admitting: Internal Medicine

## 2012-10-05 DIAGNOSIS — N183 Chronic kidney disease, stage 3 unspecified: Secondary | ICD-10-CM | POA: Diagnosis present

## 2012-10-05 DIAGNOSIS — Z8673 Personal history of transient ischemic attack (TIA), and cerebral infarction without residual deficits: Secondary | ICD-10-CM

## 2012-10-05 DIAGNOSIS — I359 Nonrheumatic aortic valve disorder, unspecified: Secondary | ICD-10-CM | POA: Diagnosis present

## 2012-10-05 DIAGNOSIS — J449 Chronic obstructive pulmonary disease, unspecified: Secondary | ICD-10-CM | POA: Diagnosis present

## 2012-10-05 DIAGNOSIS — F411 Generalized anxiety disorder: Secondary | ICD-10-CM | POA: Diagnosis present

## 2012-10-05 DIAGNOSIS — I2789 Other specified pulmonary heart diseases: Secondary | ICD-10-CM | POA: Diagnosis present

## 2012-10-05 DIAGNOSIS — E119 Type 2 diabetes mellitus without complications: Secondary | ICD-10-CM | POA: Diagnosis present

## 2012-10-05 DIAGNOSIS — I5033 Acute on chronic diastolic (congestive) heart failure: Principal | ICD-10-CM

## 2012-10-05 DIAGNOSIS — I509 Heart failure, unspecified: Secondary | ICD-10-CM | POA: Diagnosis present

## 2012-10-05 DIAGNOSIS — I5032 Chronic diastolic (congestive) heart failure: Secondary | ICD-10-CM | POA: Diagnosis present

## 2012-10-05 DIAGNOSIS — I5031 Acute diastolic (congestive) heart failure: Secondary | ICD-10-CM

## 2012-10-05 DIAGNOSIS — F329 Major depressive disorder, single episode, unspecified: Secondary | ICD-10-CM | POA: Diagnosis present

## 2012-10-05 DIAGNOSIS — Z90710 Acquired absence of both cervix and uterus: Secondary | ICD-10-CM

## 2012-10-05 DIAGNOSIS — J189 Pneumonia, unspecified organism: Secondary | ICD-10-CM

## 2012-10-05 DIAGNOSIS — Z794 Long term (current) use of insulin: Secondary | ICD-10-CM

## 2012-10-05 DIAGNOSIS — Z79899 Other long term (current) drug therapy: Secondary | ICD-10-CM

## 2012-10-05 DIAGNOSIS — J4489 Other specified chronic obstructive pulmonary disease: Secondary | ICD-10-CM | POA: Diagnosis present

## 2012-10-05 DIAGNOSIS — R680 Hypothermia, not associated with low environmental temperature: Secondary | ICD-10-CM | POA: Diagnosis present

## 2012-10-05 DIAGNOSIS — D472 Monoclonal gammopathy: Secondary | ICD-10-CM | POA: Diagnosis present

## 2012-10-05 DIAGNOSIS — Z9010 Acquired absence of unspecified breast and nipple: Secondary | ICD-10-CM

## 2012-10-05 DIAGNOSIS — N184 Chronic kidney disease, stage 4 (severe): Secondary | ICD-10-CM

## 2012-10-05 DIAGNOSIS — E785 Hyperlipidemia, unspecified: Secondary | ICD-10-CM | POA: Diagnosis present

## 2012-10-05 DIAGNOSIS — I10 Essential (primary) hypertension: Secondary | ICD-10-CM

## 2012-10-05 DIAGNOSIS — I2781 Cor pulmonale (chronic): Secondary | ICD-10-CM

## 2012-10-05 DIAGNOSIS — I4891 Unspecified atrial fibrillation: Secondary | ICD-10-CM

## 2012-10-05 DIAGNOSIS — Z96649 Presence of unspecified artificial hip joint: Secondary | ICD-10-CM

## 2012-10-05 DIAGNOSIS — I350 Nonrheumatic aortic (valve) stenosis: Secondary | ICD-10-CM

## 2012-10-05 DIAGNOSIS — F3289 Other specified depressive episodes: Secondary | ICD-10-CM | POA: Diagnosis present

## 2012-10-05 DIAGNOSIS — D638 Anemia in other chronic diseases classified elsewhere: Secondary | ICD-10-CM | POA: Diagnosis present

## 2012-10-05 DIAGNOSIS — I279 Pulmonary heart disease, unspecified: Secondary | ICD-10-CM | POA: Diagnosis present

## 2012-10-05 DIAGNOSIS — I129 Hypertensive chronic kidney disease with stage 1 through stage 4 chronic kidney disease, or unspecified chronic kidney disease: Secondary | ICD-10-CM | POA: Diagnosis present

## 2012-10-05 DIAGNOSIS — Z853 Personal history of malignant neoplasm of breast: Secondary | ICD-10-CM

## 2012-10-05 NOTE — ED Notes (Signed)
Pt from [facility], pt reports fluid for the past several days, they insisted pt come to the ER tonight.

## 2012-10-06 ENCOUNTER — Encounter (HOSPITAL_COMMUNITY): Payer: Self-pay | Admitting: *Deleted

## 2012-10-06 ENCOUNTER — Inpatient Hospital Stay (HOSPITAL_COMMUNITY): Payer: Medicare Other

## 2012-10-06 ENCOUNTER — Emergency Department (HOSPITAL_COMMUNITY): Payer: Medicare Other

## 2012-10-06 DIAGNOSIS — I5032 Chronic diastolic (congestive) heart failure: Secondary | ICD-10-CM | POA: Diagnosis present

## 2012-10-06 DIAGNOSIS — J189 Pneumonia, unspecified organism: Secondary | ICD-10-CM

## 2012-10-06 DIAGNOSIS — I5031 Acute diastolic (congestive) heart failure: Secondary | ICD-10-CM

## 2012-10-06 DIAGNOSIS — I369 Nonrheumatic tricuspid valve disorder, unspecified: Secondary | ICD-10-CM

## 2012-10-06 DIAGNOSIS — I359 Nonrheumatic aortic valve disorder, unspecified: Secondary | ICD-10-CM

## 2012-10-06 LAB — CBC
HCT: 36.8 % (ref 36.0–46.0)
Hemoglobin: 11.3 g/dL — ABNORMAL LOW (ref 12.0–15.0)
MCHC: 32.6 g/dL (ref 30.0–36.0)
MCV: 99.4 fL (ref 78.0–100.0)
Platelets: 142 10*3/uL — ABNORMAL LOW (ref 150–400)
Platelets: 148 10*3/uL — ABNORMAL LOW (ref 150–400)
RBC: 3.54 MIL/uL — ABNORMAL LOW (ref 3.87–5.11)
RDW: 15.2 % (ref 11.5–15.5)
WBC: 4 10*3/uL (ref 4.0–10.5)
WBC: 4.4 10*3/uL (ref 4.0–10.5)

## 2012-10-06 LAB — COMPREHENSIVE METABOLIC PANEL
ALT: 8 U/L (ref 0–35)
AST: 17 U/L (ref 0–37)
CO2: 24 mEq/L (ref 19–32)
Chloride: 104 mEq/L (ref 96–112)
Creatinine, Ser: 1.8 mg/dL — ABNORMAL HIGH (ref 0.50–1.10)
GFR calc non Af Amer: 24 mL/min — ABNORMAL LOW (ref >90)
Total Bilirubin: 0.5 mg/dL (ref 0.3–1.2)

## 2012-10-06 LAB — GLUCOSE, CAPILLARY
Glucose-Capillary: 166 mg/dL — ABNORMAL HIGH (ref 70–99)
Glucose-Capillary: 154 mg/dL — ABNORMAL HIGH (ref 70–99)

## 2012-10-06 LAB — BASIC METABOLIC PANEL
BUN: 41 mg/dL — ABNORMAL HIGH (ref 6–23)
Creatinine, Ser: 1.89 mg/dL — ABNORMAL HIGH (ref 0.50–1.10)
GFR calc Af Amer: 27 mL/min — ABNORMAL LOW (ref >90)
GFR calc non Af Amer: 23 mL/min — ABNORMAL LOW (ref >90)
Potassium: 4.4 mEq/L (ref 3.5–5.1)

## 2012-10-06 LAB — PRO B NATRIURETIC PEPTIDE: Pro B Natriuretic peptide (BNP): 4853 pg/mL — ABNORMAL HIGH (ref 0–450)

## 2012-10-06 MED ORDER — INSULIN GLARGINE 100 UNIT/ML SOLOSTAR PEN: 12.0000 [IU] | PEN_INJECTOR | Freq: Every day | SUBCUTANEOUS | Status: DC

## 2012-10-06 MED ORDER — DEXTROSE 5 % IV SOLN
1.0000 g | INTRAVENOUS | Status: DC
  Filled 2012-10-06: qty 10

## 2012-10-06 MED ORDER — DABIGATRAN ETEXILATE MESYLATE 75 MG PO CAPS
75.0000 mg | ORAL_CAPSULE | Freq: Two times a day (BID) | ORAL | Status: DC
  Administered 2012-10-06 – 2012-10-09 (×7): 75 mg via ORAL
  Filled 2012-10-06 (×13): qty 1

## 2012-10-06 MED ORDER — FUROSEMIDE 10 MG/ML IJ SOLN
INTRAMUSCULAR | Status: AC
  Administered 2012-10-06: 40 mg via INTRAVENOUS
  Filled 2012-10-06: qty 4

## 2012-10-06 MED ORDER — FUROSEMIDE 40 MG PO TABS
40.0000 mg | ORAL_TABLET | Freq: Once | ORAL | Status: AC
  Administered 2012-10-06: 40 mg via ORAL
  Filled 2012-10-06: qty 1

## 2012-10-06 MED ORDER — ATORVASTATIN CALCIUM 10 MG PO TABS
10.0000 mg | ORAL_TABLET | Freq: Every day | ORAL | Status: DC
  Administered 2012-10-06 – 2012-10-08 (×3): 10 mg via ORAL
  Filled 2012-10-06 (×3): qty 1

## 2012-10-06 MED ORDER — DEXTROSE 5 % IV SOLN
500.0000 mg | INTRAVENOUS | Status: DC
  Filled 2012-10-06: qty 500

## 2012-10-06 MED ORDER — FUROSEMIDE 10 MG/ML IJ SOLN
20.0000 mg | Freq: Two times a day (BID) | INTRAMUSCULAR | Status: DC
  Administered 2012-10-06: 20 mg via INTRAVENOUS
  Filled 2012-10-06: qty 2

## 2012-10-06 MED ORDER — RISAQUAD PO CAPS
1.0000 | ORAL_CAPSULE | Freq: Every day | ORAL | Status: DC
  Administered 2012-10-06 – 2012-10-09 (×4): 1 via ORAL
  Filled 2012-10-06 (×5): qty 1

## 2012-10-06 MED ORDER — ALBUTEROL SULFATE (5 MG/ML) 0.5% IN NEBU
2.5000 mg | INHALATION_SOLUTION | Freq: Four times a day (QID) | RESPIRATORY_TRACT | Status: DC
  Administered 2012-10-06 – 2012-10-07 (×4): 2.5 mg via RESPIRATORY_TRACT
  Filled 2012-10-06 (×4): qty 0.5

## 2012-10-06 MED ORDER — IPRATROPIUM BROMIDE 0.02 % IN SOLN
0.5000 mg | Freq: Four times a day (QID) | RESPIRATORY_TRACT | Status: DC
  Administered 2012-10-06 – 2012-10-07 (×4): 0.5 mg via RESPIRATORY_TRACT
  Filled 2012-10-06 (×4): qty 2.5

## 2012-10-06 MED ORDER — FUROSEMIDE 10 MG/ML IJ SOLN
40.0000 mg | Freq: Two times a day (BID) | INTRAMUSCULAR | Status: DC
  Administered 2012-10-06: 40 mg via INTRAVENOUS
  Filled 2012-10-06: qty 4

## 2012-10-06 MED ORDER — TRIAMCINOLONE ACETONIDE 0.1 % EX CREA
1.0000 "application " | TOPICAL_CREAM | Freq: Every day | CUTANEOUS | Status: DC
  Administered 2012-10-06 – 2012-10-09 (×4): 1 via TOPICAL
  Filled 2012-10-06: qty 15

## 2012-10-06 MED ORDER — SODIUM CHLORIDE 0.9 % IV SOLN: 250.0000 mL | INTRAVENOUS | Status: DC | PRN

## 2012-10-06 MED ORDER — DEXTROSE 5 % IV SOLN
500.0000 mg | Freq: Once | INTRAVENOUS | Status: AC
  Administered 2012-10-06: 500 mg via INTRAVENOUS
  Filled 2012-10-06: qty 500

## 2012-10-06 MED ORDER — CHLORHEXIDINE GLUCONATE CLOTH 2 % EX PADS
6.0000 | MEDICATED_PAD | Freq: Every day | CUTANEOUS | Status: DC
  Administered 2012-10-07 – 2012-10-09 (×3): 6 via TOPICAL

## 2012-10-06 MED ORDER — METOPROLOL TARTRATE 50 MG PO TABS
50.0000 mg | ORAL_TABLET | Freq: Two times a day (BID) | ORAL | Status: DC
  Administered 2012-10-06 – 2012-10-09 (×6): 50 mg via ORAL
  Filled 2012-10-06 (×7): qty 1

## 2012-10-06 MED ORDER — INSULIN GLARGINE 100 UNIT/ML ~~LOC~~ SOLN
12.0000 [IU] | Freq: Every day | SUBCUTANEOUS | Status: DC
  Administered 2012-10-06 – 2012-10-09 (×4): 12 [IU] via SUBCUTANEOUS
  Filled 2012-10-06 (×7): qty 0.12

## 2012-10-06 MED ORDER — POLYETHYLENE GLYCOL 3350 17 GM/SCOOP PO POWD
17.0000 g | Freq: Every day | ORAL | Status: DC | PRN
  Filled 2012-10-06: qty 255

## 2012-10-06 MED ORDER — DILTIAZEM HCL ER COATED BEADS 240 MG PO CP24
240.0000 mg | ORAL_CAPSULE | Freq: Every day | ORAL | Status: DC
  Administered 2012-10-06 – 2012-10-09 (×4): 240 mg via ORAL
  Filled 2012-10-06 (×4): qty 1

## 2012-10-06 MED ORDER — LORATADINE 10 MG PO TABS
10.0000 mg | ORAL_TABLET | Freq: Every day | ORAL | Status: DC
  Administered 2012-10-06 – 2012-10-09 (×4): 10 mg via ORAL
  Filled 2012-10-06 (×4): qty 1

## 2012-10-06 MED ORDER — POLYETHYLENE GLYCOL 3350 17 G PO PACK
17.0000 g | PACK | Freq: Every day | ORAL | Status: DC | PRN
  Administered 2012-10-08: 17 g via ORAL
  Filled 2012-10-06 (×2): qty 1

## 2012-10-06 MED ORDER — POTASSIUM CHLORIDE CRYS ER 20 MEQ PO TBCR
20.0000 meq | EXTENDED_RELEASE_TABLET | Freq: Every day | ORAL | Status: DC
  Administered 2012-10-06: 20 meq via ORAL
  Filled 2012-10-06: qty 1

## 2012-10-06 MED ORDER — SODIUM CHLORIDE 0.9 % IJ SOLN
3.0000 mL | Freq: Two times a day (BID) | INTRAMUSCULAR | Status: DC
  Administered 2012-10-06 – 2012-10-09 (×7): 3 mL via INTRAVENOUS

## 2012-10-06 MED ORDER — MUPIROCIN 2 % EX OINT
1.0000 "application " | TOPICAL_OINTMENT | Freq: Two times a day (BID) | CUTANEOUS | Status: DC
  Administered 2012-10-06 – 2012-10-09 (×7): 1 via NASAL
  Filled 2012-10-06 (×3): qty 22

## 2012-10-06 MED ORDER — DEXTROSE 5 % IV SOLN
1.0000 g | Freq: Once | INTRAVENOUS | Status: AC
  Administered 2012-10-06: 1 g via INTRAVENOUS
  Filled 2012-10-06: qty 10

## 2012-10-06 MED ORDER — SODIUM CHLORIDE 0.9 % IJ SOLN: 3.0000 mL | INTRAMUSCULAR | Status: DC | PRN

## 2012-10-06 MED ORDER — PANTOPRAZOLE SODIUM 40 MG PO TBEC
40.0000 mg | DELAYED_RELEASE_TABLET | Freq: Two times a day (BID) | ORAL | Status: DC
  Administered 2012-10-06 – 2012-10-07 (×4): 40 mg via ORAL
  Filled 2012-10-06 (×4): qty 1

## 2012-10-06 MED ORDER — ALBUTEROL SULFATE (5 MG/ML) 0.5% IN NEBU
2.5000 mg | INHALATION_SOLUTION | RESPIRATORY_TRACT | Status: DC | PRN
  Administered 2012-10-07: 2.5 mg via RESPIRATORY_TRACT
  Filled 2012-10-06: qty 0.5

## 2012-10-06 MED ORDER — CITALOPRAM HYDROBROMIDE 20 MG PO TABS
20.0000 mg | ORAL_TABLET | Freq: Every day | ORAL | Status: DC
  Administered 2012-10-06 – 2012-10-09 (×4): 20 mg via ORAL
  Filled 2012-10-06 (×4): qty 1

## 2012-10-06 MED ORDER — MIRTAZAPINE 30 MG PO TABS
15.0000 mg | ORAL_TABLET | Freq: Every day | ORAL | Status: DC
  Administered 2012-10-06 – 2012-10-08 (×3): 15 mg via ORAL
  Filled 2012-10-06 (×3): qty 1

## 2012-10-06 MED ORDER — DOCUSATE SODIUM 100 MG PO CAPS
100.0000 mg | ORAL_CAPSULE | Freq: Two times a day (BID) | ORAL | Status: DC | PRN
  Administered 2012-10-06: 100 mg via ORAL
  Filled 2012-10-06 (×2): qty 1

## 2012-10-06 MED ORDER — INSULIN ASPART 100 UNIT/ML ~~LOC~~ SOLN
0.0000 [IU] | Freq: Three times a day (TID) | SUBCUTANEOUS | Status: DC
  Administered 2012-10-06 – 2012-10-07 (×3): 2 [IU] via SUBCUTANEOUS
  Administered 2012-10-08 – 2012-10-09 (×3): 1 [IU] via SUBCUTANEOUS

## 2012-10-06 MED ORDER — METOPROLOL SUCCINATE ER 50 MG PO TB24: 50.0000 mg | ORAL_TABLET | Freq: Two times a day (BID) | ORAL | Status: DC

## 2012-10-06 MED ORDER — ACETAMINOPHEN 325 MG PO TABS
325.0000 mg | ORAL_TABLET | Freq: Four times a day (QID) | ORAL | Status: DC | PRN
  Administered 2012-10-09: 325 mg via ORAL
  Filled 2012-10-06: qty 1

## 2012-10-06 NOTE — Care Management Note (Signed)
    Page 1 of 1   10/09/2012     4:43:23 PM   CARE MANAGEMENT NOTE 10/09/2012  Patient:  Catherine [name], Catherine [name]   Account Number:  [id number]  Date Initiated:  10/06/2012  Documentation initiated by:  Anibal Henderson  Subjective/Objective Assessment:   Admitted with resp failure, on BiPAP. Pt is from [facility], ALF and CSW aware and will follow     Action/Plan:   No CM needs identified   Anticipated DC Date:  10/09/2012   Anticipated DC Plan:  ASSISTED LIVING / REST HOME  In-house referral  Clinical Social Worker      DC Planning Services  CM consult      Choice offered to / List presented to:             Status of service:  Completed, signed off Medicare Important Message given?  YES (If response is "NO", the following Medicare IM given date fields will be blank) Date Medicare IM given:  10/09/2012 Date Additional Medicare IM given:    Discharge Disposition:  ASSISTED LIVING  Per UR Regulation:  Reviewed for med. necessity/level of care/duration of stay  If discussed at Long Length of Stay Meetings, dates discussed:    Comments:  10/06/12 1300 Anibal Henderson RN/CM

## 2012-10-06 NOTE — Progress Notes (Signed)
*  PRELIMINARY RESULTS* Echocardiogram 2D Echocardiogram has been performed.  Catherine [name] 10/06/2012, 12:47 PM

## 2012-10-06 NOTE — Clinical Social Work Psychosocial (Signed)
    Clinical Social Work Department BRIEF PSYCHOSOCIAL ASSESSMENT 10/06/2012  Patient:  Catherine [name], Catherine [name]     Account Number:  [id number]     Admit date:  10/05/2012  Clinical Social Worker:  Santa Genera, CLINICAL SOCIAL WORKER  Date/Time:  10/06/2012 09:00 AM  Referred by:  Physician  Date Referred:  10/06/2012 Referred for  ALF Placement   Other Referral:   Interview type:  Patient Other interview type:   Also spoke w [facility] staff    PSYCHOSOCIAL DATA Living Status:  FACILITY Admitted from facility:  [facility] Level of care:  Assisted Living Primary support name:  Catherine [name] Primary support relationship to patient:  CHILD, ADULT Degree of support available:   Adequate, lives in ALF and has support from step children.    CURRENT CONCERNS Current Concerns  Post-Acute Placement   Other Concerns:    SOCIAL WORK ASSESSMENT / PLAN CSW met w patient at bedside, patient alert and oriented x4.  Patient lives at [facility], placed there 05/19/12 after SNF placement in [place].  Had treatment for "skin condition" which required multiple skin excisions, patient was going to have skin graft but declined to proceed w treatment.  Per patient, she has condition that "I cannot remember the name of and there is no treatment for."    Per [facility], patient is very independent.  Walks w walker in room, reqiures assistance for bathing but dresses and toilets independently.  Uses wheelchair to go for long distances (to dining room for example).  Patient states that she "likes her privacy" and would like to return home at some point.  [facility] is willing to take patient back at discharge and expressed no concerns w her care.  CSW did not talk w family as patient was fully oriented and able to give adequate history.   Assessment/plan status:  Psychosocial Support/Ongoing Assessment of Needs Other assessment/ plan:   Information/referral to community resources:    None needed at this time.    PATIENT'S/FAMILY'S RESPONSE TO PLAN OF CARE: Patient and facility willing to work w CSW on discharge planning.    Santa Genera, LCSW Clinical Social Worker [phone])

## 2012-10-06 NOTE — ED Provider Notes (Signed)
History     CSN: [id number]  Arrival date & time 10/05/12  2258   First MD Initiated Contact with Patient 10/06/12 0022      Chief Complaint  Patient presents with  . Leg Swelling     The history is provided by the patient, a relative and medical records.   patient was brought to the emergency department because of increasing lower extremity edema over the past several days as well as associated shortness of breath.  As reported that she has a history of atrial fibrillation congestive heart failure.  Echocardiogram in 2011 demonstrated ejection fraction of 60% with some calcified aortic valve leaflets.  She also carries a history of pulmonary hypertension in her past medical history from the assisted living facility.  She reports cough without fever or chills.  Family reports that she seems more labored in her breathing today.  She's been compliant with her Lasix which is 20 mg twice a day.  She states that she follows with her primary care physician Dr. Ouida Sills but has never seen a cardiologist.  She is on Pradaxa for chronic anticoagulation.  Her symptoms are worsened by lying flat.  Her symptoms are moderate in severity.  Nothing improves her symptoms.   Past Medical History  Diagnosis Date  . CHF (congestive heart failure)   . Diabetes mellitus   . Hypertension   . COPD (chronic obstructive pulmonary disease)   . Vertigo   . Cancer     breast with mastectomy  . Stroke       ?  . Skin disorder      Epidermolysis bullosa.  . Pulmonary hypertension   . Atrial fibrillation 2011  . Multiple thyroid nodules   . Anemia   . Breast cancer   . Anemia of other chronic disease 06/09/2012    Past Surgical History  Procedure Laterality Date  . Cholecystectomy    . Hemorrhoid surgery    . Abdominal hysterectomy  1991    complete hysterectomy for cancer  . Mastectomy  2005    left breast, tamoxifin  . Goiter    . Hip fracture surgery  2010    right hip replacement  . Hip fracture  surgery  2006    left hip replacement  . Colonoscopy  ?    remote  . Cataract extraction, bilateral      Family History  Problem Relation Age of Onset  . Colon cancer Neg Hx   . Heart attack Son     age 69s, suspected MI  . Diabetes Mother     History  Substance Use Topics  . Smoking status: Never Smoker   . Smokeless tobacco: Never Used  . Alcohol Use: No    OB History   Grav Para Term Preterm Abortions TAB SAB Ect Mult Living                  Review of Systems  All other systems reviewed and are negative.    Allergies  Amoxicillin-pot clavulanate  Home Medications   Current Outpatient Rx  Name  Route  Sig  Dispense  Refill  . acetaminophen (TYLENOL) 325 MG tablet   Oral   Take 325 mg by mouth every 6 (six) hours as needed for pain.         Marland Kitchen albuterol (PROAIR HFA) 108 (90 BASE) MCG/ACT inhaler   Inhalation   Inhale 2 puffs into the lungs every 4 (four) hours as needed for wheezing.         Marland Kitchen  atorvastatin (LIPITOR) 10 MG tablet      10 mg at bedtime.          . bisacodyl (DULCOLAX) 5 MG EC tablet   Oral   Take 5 mg by mouth daily as needed for constipation. Takes 2 daily as needed         . Cholecalciferol (VITAMIN D) 2000 UNITS CAPS   Oral   Take 1 capsule by mouth every morning.           . citalopram (CELEXA) 40 MG tablet   Oral   Take 0.5 tablets (20 mg total) by mouth daily. For mood   30 tablet   12   . dabigatran (PRADAXA) 150 MG CAPS   Oral   Take 75 mg by mouth 2 (two) times daily.          Marland Kitchen diltiazem (CARDIZEM CD) 240 MG 24 hr capsule   Oral   Take 240 mg by mouth daily.           Marland Kitchen docusate sodium (COLACE) 100 MG capsule   Oral   Take 100 mg by mouth 2 (two) times daily as needed for constipation.         . ferrous sulfate 325 (65 FE) MG tablet   Oral   Take 325 mg by mouth daily with breakfast.         . furosemide (LASIX) 20 MG tablet   Oral   Take 20 mg by mouth 2 (two) times daily.           .  insulin lispro (HUMALOG) 100 UNIT/ML injection   Subcutaneous   Inject 4 Units into the skin 3 (three) times daily before meals.         Marland Kitchen LANTUS SOLOSTAR 100 UNIT/ML injection   Subcutaneous   Inject 12 Units into the skin daily.          Marland Kitchen lidocaine (LIDODERM) 5 %      1 patch. Apply 1 patch for 12 hours then off for 12 hours         . loratadine (CLARITIN) 10 MG tablet   Oral   Take 10 mg by mouth daily.         . metoprolol (LOPRESSOR) 50 MG tablet               . mirtazapine (REMERON) 15 MG tablet      15 mg at bedtime.          Marland Kitchen NOVOFINE AUTOCOVER 30G X 8 MM MISC      1 packet 3 (three) times daily.          . pantoprazole (PROTONIX) 40 MG tablet   Oral   Take 40 mg by mouth every 12 (twelve) hours.          . polyethylene glycol powder (GLYCOLAX/MIRALAX) powder   Oral   Take 17 g by mouth daily as needed.          . potassium chloride SA (K-DUR,KLOR-CON) 20 MEQ tablet   Oral   Take 20 mEq by mouth daily.           Marland Kitchen triamcinolone cream (KENALOG) 0.1 %   Topical   Apply 1 application topically daily. Apply to lower extremities once daily         . Flora-Q (FLORA-Q) CAPS   Oral   Take 1 capsule by mouth daily.   30 capsule   1   . EXPIRED: metoprolol  succinate (TOPROL-XL) 25 MG 24 hr tablet   Oral   Take 50 mg by mouth every 12 (twelve) hours.         Marland Kitchen NOVOLOG FLEXPEN 100 UNIT/ML injection   Subcutaneous   Inject 2 Units into the skin 3 (three) times daily before meals.            BP 123/69  Pulse 86  Temp(Src) 95.9 F (35.5 C) (Oral)  Resp 16  Ht 5\' 2"  (1.575 m)  Wt 140 lb (63.504 kg)  BMI 25.6 kg/m2  SpO2 93%  Physical Exam  Nursing note and vitals reviewed. Constitutional: She is oriented to person, place, and time. She appears well-developed and well-nourished. No distress.  HENT:  Head: Normocephalic and atraumatic.  Eyes: EOM are normal.  Neck: Normal range of motion.  Cardiovascular: Normal rate and  regular rhythm.   Systolic ejection murmur  Pulmonary/Chest: She has rales.  Tachypnea  Abdominal: Soft. She exhibits no distension. There is no tenderness.  Musculoskeletal: Normal range of motion. She exhibits no tenderness.  2+ pitting edema bilaterally  Neurological: She is alert and oriented to person, place, and time.  Skin: Skin is warm and dry.  Psychiatric: She has a normal mood and affect. Judgment normal.    ED Course  Procedures (including critical care time)   Date: 10/06/2012  Rate: 77  Rhythm: Atrial fibrillation with controlled rate  QRS Axis: normal  Intervals: normal  ST/T Wave abnormalities: Nonspecific ST and T wave changes  Conduction Disutrbances: Right bundle branch block  Narrative Interpretation:   Old EKG Reviewed: No significant changes noted     Labs Reviewed  CBC - Abnormal; Notable for the following:    RBC 3.71 (*)    Platelets 142 (*)    All other components within normal limits  BASIC METABOLIC PANEL - Abnormal; Notable for the following:    Glucose, Bld 120 (*)    BUN 41 (*)    Creatinine, Ser 1.89 (*)    GFR calc non Af Amer 23 (*)    GFR calc Af Amer 27 (*)    All other components within normal limits  PRO B NATRIURETIC PEPTIDE - Abnormal; Notable for the following:    Pro B Natriuretic peptide (BNP) 4853.0 (*)    All other components within normal limits  TROPONIN I   Dg Chest 2 View  10/06/2012   *RADIOLOGY REPORT*  Clinical Data: The shortness of breath and leg swelling.  CHEST - 2 VIEW  Comparison: 01/24/2010  Findings: Cardiac enlargement similar to previous study.  Prominent pulmonary vascularity suggesting mild congestion.  No edema.  There is a small right pleural effusion with infiltration or atelectasis in the right lung base.  Changes could represent pneumonia.  No blunting of left costophrenic angle.  No pneumothorax.  Old right rib fractures.  Postoperative changes with left mastectomy and surgical clips in the left  axilla.  Calcified and tortuous aorta. Probable underlying emphysematous changes.  Mild compression deformities of several thoracic vertebrae suggesting osteoporosis.  IMPRESSION: Cardiac enlargement with mild pulmonary vascular congestion and diffuse emphysematous changes present.  There is a small right pleural effusion with infiltration or atelectasis in the right lung base possibly representing pneumonia.   Original Report Authenticated By: Burman Nieves, M.D.   I personally reviewed the imaging tests through PACS system I reviewed available ER/hospitalization records through the EMR    1. CHF (congestive heart failure), acute, diastolic   2. CAP (community acquired pneumonia)  MDM  Symptoms concerning for CHF exacerbation.  She does have a significant systolic ejection murmur on exam and this may represent worsening aortic valve disease.  She'll likely need echocardiogram during this hospitalization.  Her chest x-ray also is concerning for right lower lobe infiltrate.  Rocephin and azithromycin started.  The patient needs to be admitted the hospital.  Small dose of IV Lasix initiated in emergency department.  Admitted to the hospital.        Lyanne Co, MD 10/06/12 [phone]

## 2012-10-06 NOTE — Progress Notes (Signed)
NAME:  Catherine [name], Catherine [name]                   ACCOUNT NO.:  [id number]  MEDICAL RECORD NO.:  [id number]  LOCATION:  A223                          FACILITY:  [facility]  PHYSICIAN:  Kingsley Callander. Ouida Sills, MD       DATE OF BIRTH:  22-Jan-1924  DATE OF PROCEDURE: DATE OF DISCHARGE:                                PROGRESS NOTE   HISTORY OF PRESENT ILLNESS:  Catherine [name] was admitted last night with swelling in her legs and shortness of breath.  She has a history of chronic diastolic heart failure, chronic atrial fibrillation, pulmonary hypertension, and aortic stenosis.  She was found to have an elevated BNP over 4000.  Her chest x-ray revealed pulmonary vascular congestion with cardiomegaly and possible pneumonia.  She has had cough, but denies fever.  PHYSICAL EXAMINATION:  VITAL SIGNS:  Temperature was low on admission at 95.9, but is normal this morning at 98.4, heart rate is ranging from the 80s to 100 range in chronic AFib, respirations 17, blood pressure 114/59, oxygen saturation 97% on supplemental oxygen. LUNGS:  Basilar rales. HEART:  Irregularly irregular with a grade 2 systolic murmur. ABDOMEN:  Soft and nontender. EXTREMITIES:  2+ edema in the legs.  IMPRESSION/PLAN: 1. Congestive heart failure.  Treat with IV Lasix, modify dose to 40     mg q.12 hours. 2. Possible pneumonia.  Continue ceftriaxone and azithromycin for now.     Her white count is 4.4 today. 3. Aortic stenosis.  Repeat echo has been ordered.  We will consult     Cardiology. 4. Pulmonary hypertension.  Continue supplemental oxygen. 5. Monoclonal gammopathy of uncertain significance.  She has been     treated with Procrit by Hematology.  Hemoglobin levels have been 12     and 11.3 on this admission. 6. Chronic kidney disease.  BUN and creatinine are stable at 40 and     1.8. 7. Diabetes.  Fasting glucose is 128. 8. Chronic atrial fibrillation.  Continue diltiazem and metoprolol and     Pradaxa. 9. Epidermolysis bullosa.     Kingsley Callander. Ouida Sills, MD     ROF/MEDQ  D:  10/06/2012  T:  10/06/2012  Job:  [id number]

## 2012-10-06 NOTE — H&P (Addendum)
PCP:   Carylon Perches, MD   Chief Complaint:  Shortness of breath  HPI: 79 year old female with history of CHF, diverticulitis, hypertension, COPD came from group home today with shortness of breath going on for past 2 days. She has been coughing along with shortness of breath. She denies fever, no nausea vomiting or diarrhea. Patient does have a history of aortic stenosis.  Allergies:   Allergies  Allergen Reactions  . Amoxicillin-Pot Clavulanate Diarrhea      Past Medical History  Diagnosis Date  . CHF (congestive heart failure)   . Diabetes mellitus   . Hypertension   . COPD (chronic obstructive pulmonary disease)   . Vertigo   . Cancer     breast with mastectomy  . Stroke       ?  . Skin disorder      Epidermolysis bullosa.  . Pulmonary hypertension   . Atrial fibrillation 2011  . Multiple thyroid nodules   . Anemia   . Breast cancer   . Anemia of other chronic disease 06/09/2012    Past Surgical History  Procedure Laterality Date  . Cholecystectomy    . Hemorrhoid surgery    . Abdominal hysterectomy  1991    complete hysterectomy for cancer  . Mastectomy  2005    left breast, tamoxifin  . Goiter    . Hip fracture surgery  2010    right hip replacement  . Hip fracture surgery  2006    left hip replacement  . Colonoscopy  ?    remote  . Cataract extraction, bilateral      Prior to Admission medications   Medication Sig Start Date End Date Taking? Authorizing Provider  acetaminophen (TYLENOL) 325 MG tablet Take 325 mg by mouth every 6 (six) hours as needed for pain.   Yes Historical Provider, MD  albuterol (PROAIR HFA) 108 (90 BASE) MCG/ACT inhaler Inhale 2 puffs into the lungs every 4 (four) hours as needed for wheezing.   Yes Historical Provider, MD  atorvastatin (LIPITOR) 10 MG tablet 10 mg at bedtime.  05/04/12  Yes Historical Provider, MD  bisacodyl (DULCOLAX) 5 MG EC tablet Take 5 mg by mouth daily as needed for constipation. Takes 2 daily as needed   Yes  Historical Provider, MD  Cholecalciferol (VITAMIN D) 2000 UNITS CAPS Take 1 capsule by mouth every morning.     Yes Historical Provider, MD  citalopram (CELEXA) 40 MG tablet Take 0.5 tablets (20 mg total) by mouth daily. For mood 12/18/10  Yes Carylon Perches, MD  dabigatran (PRADAXA) 150 MG CAPS Take 75 mg by mouth 2 (two) times daily.    Yes Historical Provider, MD  diltiazem (CARDIZEM CD) 240 MG 24 hr capsule Take 240 mg by mouth daily.     Yes Historical Provider, MD  docusate sodium (COLACE) 100 MG capsule Take 100 mg by mouth 2 (two) times daily as needed for constipation.   Yes Historical Provider, MD  ferrous sulfate 325 (65 FE) MG tablet Take 325 mg by mouth daily with breakfast.   Yes Historical Provider, MD  furosemide (LASIX) 20 MG tablet Take 20 mg by mouth 2 (two) times daily.     Yes Historical Provider, MD  insulin lispro (HUMALOG) 100 UNIT/ML injection Inject 4 Units into the skin 3 (three) times daily before meals.   Yes Historical Provider, MD  LANTUS SOLOSTAR 100 UNIT/ML injection Inject 12 Units into the skin daily.  05/08/12  Yes Historical Provider, MD  lidocaine (LIDODERM) 5 %  1 patch. Apply 1 patch for 12 hours then off for 12 hours 04/23/12  Yes Historical Provider, MD  loratadine (CLARITIN) 10 MG tablet Take 10 mg by mouth daily.   Yes Historical Provider, MD  metoprolol (LOPRESSOR) 50 MG tablet  06/01/12  Yes Historical Provider, MD  mirtazapine (REMERON) 15 MG tablet 15 mg at bedtime.  05/26/12  Yes Historical Provider, MD  NOVOFINE AUTOCOVER 30G X 8 MM MISC 1 packet 3 (three) times daily.  05/21/12  Yes Historical Provider, MD  pantoprazole (PROTONIX) 40 MG tablet Take 40 mg by mouth every 12 (twelve) hours.  04/22/12  Yes Historical Provider, MD  polyethylene glycol powder (GLYCOLAX/MIRALAX) powder Take 17 g by mouth daily as needed.  05/19/12  Yes Historical Provider, MD  potassium chloride SA (K-DUR,KLOR-CON) 20 MEQ tablet Take 20 mEq by mouth daily.     Yes Historical Provider,  MD  triamcinolone cream (KENALOG) 0.1 % Apply 1 application topically daily. Apply to lower extremities once daily   Yes Historical Provider, MD  Flora-Q [facility]) CAPS Take 1 capsule by mouth daily. 12/18/10   Carylon Perches, MD  metoprolol succinate (TOPROL-XL) 25 MG 24 hr tablet Take 50 mg by mouth every 12 (twelve) hours. 12/18/10 12/18/11  Carylon Perches, MD  NOVOLOG FLEXPEN 100 UNIT/ML injection Inject 2 Units into the skin 3 (three) times daily before meals.  05/08/12   Historical Provider, MD    Social History:  reports that she has never smoked. She has never used smokeless tobacco. She reports that she does not drink alcohol or use illicit drugs.  Family History  Problem Relation Age of Onset  . Colon cancer Neg Hx   . Heart attack Son     age 53s, suspected MI  . Diabetes Mother     Review of Systems:  HEENT: Denies headache, blurred vision, runny nose, sore throat Neck: Denies thyroid problems,lymphadenopathy Chest : See history of present illness Heart : Denies Chest pain GI: Denies  nausea, vomiting, diarrhea, constipation GU: Denies dysuria, urgency, frequency of urination, hematuria Neuro: Denies stroke, seizures, syncope    Physical Exam: Blood pressure 123/69, pulse 86, temperature 95.9 F (35.5 C), temperature source Oral, resp. rate 16, height 5\' 2"  (1.575 m), weight 63.504 kg (140 lb), SpO2 93.00%. Constitutional:   Patient is a well-developed and well-nourished female in no acute distress and cooperative with exam. Head: Normocephalic and atraumatic Mouth: Mucus membranes moist Eyes: PERRL, EOMI, conjunctivae normal Neck: Supple, No Thyromegaly Cardiovascular: RRR, S1 normal, S2 normal, loud ejection systolic murmur in the mitral and aortic area Pulmonary/Chest: Scattered wheezing, reduced breath sounds on right Abdominal: Soft. Non-tender, non-distended, bowel sounds are normal, no masses, organomegaly, or guarding present.  Neurological: A&O x3, Strenght is normal  and symmetric bilaterally, cranial nerve II-XII are grossly intact, no focal motor deficit, sensory intact to light touch bilaterally.  Extremities : Bilateral 1+ nonpitting edema,  Skin: Multiple erythematous lesions noted in the lower extremities also the hips. Dressing in place over the hips    Labs on Admission:  Results for orders placed during the hospital encounter of 10/05/12 (from the past 48 hour(s))  CBC     Status: Abnormal   Collection Time    10/06/12 12:50 AM      Result Value Range   WBC 4.0  4.0 - 10.5 K/uL   RBC 3.71 (*) 3.87 - 5.11 MIL/uL   Hemoglobin 12.0  12.0 - 15.0 g/dL   HCT 16.1  09.6 - 04.5 %  MCV 99.2  78.0 - 100.0 fL   MCH 32.3  26.0 - 34.0 pg   MCHC 32.6  30.0 - 36.0 g/dL   RDW 16.1  09.6 - 04.5 %   Platelets 142 (*) 150 - 400 K/uL  BASIC METABOLIC PANEL     Status: Abnormal   Collection Time    10/06/12 12:50 AM      Result Value Range   Sodium 140  135 - 145 mEq/L   Potassium 4.4  3.5 - 5.1 mEq/L   Chloride 101  96 - 112 mEq/L   CO2 27  19 - 32 mEq/L   Glucose, Bld 120 (*) 70 - 99 mg/dL   BUN 41 (*) 6 - 23 mg/dL   Creatinine, Ser 4.09 (*) 0.50 - 1.10 mg/dL   Calcium 9.0  8.4 - 81.1 mg/dL   GFR calc non Af Amer 23 (*) >90 mL/min   GFR calc Af Amer 27 (*) >90 mL/min   Comment:            The eGFR has been calculated     using the CKD EPI equation.     This calculation has not been     validated in all clinical     situations.     eGFR's persistently     <90 mL/min signify     possible Chronic Kidney Disease.  PRO B NATRIURETIC PEPTIDE     Status: Abnormal   Collection Time    10/06/12 12:50 AM      Result Value Range   Pro B Natriuretic peptide (BNP) 4853.0 (*) 0 - 450 pg/mL  TROPONIN I     Status: None   Collection Time    10/06/12 12:50 AM      Result Value Range   Troponin I <0.30  <0.30 ng/mL   Comment:            Due to the release kinetics of cTnI,     a negative result within the first hours     of the onset of symptoms does  not rule out     myocardial infarction with certainty.     If myocardial infarction is still suspected,     repeat the test at appropriate intervals.    Radiological Exams on Admission: Dg Chest 2 View  10/06/2012   *RADIOLOGY REPORT*  Clinical Data: The shortness of breath and leg swelling.  CHEST - 2 VIEW  Comparison: 01/24/2010  Findings: Cardiac enlargement similar to previous study.  Prominent pulmonary vascularity suggesting mild congestion.  No edema.  There is a small right pleural effusion with infiltration or atelectasis in the right lung base.  Changes could represent pneumonia.  No blunting of left costophrenic angle.  No pneumothorax.  Old right rib fractures.  Postoperative changes with left mastectomy and surgical clips in the left axilla.  Calcified and tortuous aorta. Probable underlying emphysematous changes.  Mild compression deformities of several thoracic vertebrae suggesting osteoporosis.  IMPRESSION: Cardiac enlargement with mild pulmonary vascular congestion and diffuse emphysematous changes present.  There is a small right pleural effusion with infiltration or atelectasis in the right lung base possibly representing pneumonia.   Original Report Authenticated By: Burman Nieves, M.D.    Assessment/Plan Active Problems:   CHF (congestive heart failure)   Aortic stenosis   Community acquired pneumonia Diabetes mellitus  CHF exacerbation Patient does have a history of CHF, last echo shows EF 55-60% Will obtain another 2-D echocardiogram Start Lasix 20 mg  IV every 12 hours Patient's BNP is elevated to 4853  Community acquired pneumonia Patient's chest x-ray shows small right pleural effusion with infiltration or atelectasis in the right lung base possibly representing pneumonia. Will start patient on Rocephin and Zithromax  Diabetes mellitus We'll start the patient on sliding-scale insulin Monitor CBGs 4 times a day  Aortic stenosis Patient has loud systolic  murmur in the aortic area We'll obtain another 2-D echo to assess the gradient  Atrial fibrillation Heart rate is well controlled with this time We'll continue with Toprol XL 50 mg by mouth daily Also continue with Pradaxa  Acute on CKD Continue to monitor the bun and creatinine.  Hypothermia Mild, temp is 95.9 ? Secondary to pneumonia Will continue to monitor.   DVT prophylaxis Lovenox    Time Spent on Admission: 75 min  Thao Bauza S [facility] Pager: [phone] 10/06/2012, 3:07 AM

## 2012-10-06 NOTE — Progress Notes (Signed)
PT BEING TRANSFERRED TO ROOM 223.ALERT AND ORIENTED. O2 SAT 96% ON O2 AT 1L/MIN VIA [place]. LUNG SOUNDS ARE MUCH IMPROVED.PT TOLERATED GETTING OOB TO COMMODE TO VOID. RT AC NSL PATENT. SKIN WARM AND DRY. REPORT CALLED TO LEIGHANN  RN ON 200.

## 2012-10-07 ENCOUNTER — Encounter (HOSPITAL_COMMUNITY): Payer: Self-pay | Admitting: Cardiology

## 2012-10-07 DIAGNOSIS — N184 Chronic kidney disease, stage 4 (severe): Secondary | ICD-10-CM

## 2012-10-07 DIAGNOSIS — I5033 Acute on chronic diastolic (congestive) heart failure: Principal | ICD-10-CM

## 2012-10-07 DIAGNOSIS — I4891 Unspecified atrial fibrillation: Secondary | ICD-10-CM

## 2012-10-07 DIAGNOSIS — I509 Heart failure, unspecified: Secondary | ICD-10-CM

## 2012-10-07 DIAGNOSIS — I10 Essential (primary) hypertension: Secondary | ICD-10-CM

## 2012-10-07 LAB — BASIC METABOLIC PANEL
Chloride: 104 mEq/L (ref 96–112)
Creatinine, Ser: 1.86 mg/dL — ABNORMAL HIGH (ref 0.50–1.10)
GFR calc Af Amer: 27 mL/min — ABNORMAL LOW (ref >90)
Glucose, Bld: 79 mg/dL (ref 70–99)
Potassium: 3.7 mEq/L (ref 3.5–5.1)

## 2012-10-07 LAB — GLUCOSE, CAPILLARY: Glucose-Capillary: 86 mg/dL (ref 70–99)

## 2012-10-07 LAB — URINALYSIS, ROUTINE W REFLEX MICROSCOPIC
Nitrite: NEGATIVE
Specific Gravity, Urine: 1.01 (ref 1.005–1.030)
Urobilinogen, UA: 0.2 mg/dL (ref 0.0–1.0)

## 2012-10-07 LAB — LIPID PANEL
Cholesterol: 77 mg/dL (ref 0–200)
Triglycerides: 43 mg/dL (ref <150)

## 2012-10-07 MED ORDER — FUROSEMIDE 10 MG/ML IJ SOLN
40.0000 mg | Freq: Every day | INTRAMUSCULAR | Status: DC
  Administered 2012-10-07 – 2012-10-09 (×3): 40 mg via INTRAVENOUS
  Filled 2012-10-07 (×3): qty 4

## 2012-10-07 MED ORDER — ALBUTEROL SULFATE (5 MG/ML) 0.5% IN NEBU
2.5000 mg | INHALATION_SOLUTION | RESPIRATORY_TRACT | Status: DC
  Administered 2012-10-08: 2.5 mg via RESPIRATORY_TRACT
  Filled 2012-10-07: qty 0.5

## 2012-10-07 MED ORDER — IPRATROPIUM BROMIDE 0.02 % IN SOLN
0.5000 mg | RESPIRATORY_TRACT | Status: DC
  Administered 2012-10-08: 0.5 mg via RESPIRATORY_TRACT
  Filled 2012-10-07: qty 2.5

## 2012-10-07 MED ORDER — POTASSIUM CHLORIDE CRYS ER 20 MEQ PO TBCR
20.0000 meq | EXTENDED_RELEASE_TABLET | Freq: Three times a day (TID) | ORAL | Status: AC
  Administered 2012-10-07 – 2012-10-08 (×6): 20 meq via ORAL
  Filled 2012-10-07 (×6): qty 1

## 2012-10-07 NOTE — Clinical Documentation Improvement (Signed)
CKD DOCUMENTATION CLARIFICATION QUERY   THIS DOCUMENT IS NOT A PERMANENT PART OF THE MEDICAL RECORD  TO RESPOND TO THE THIS QUERY, FOLLOW THE INSTRUCTIONS BELOW:  1. If needed, update documentation for the patient's encounter via the notes activity.  2. Access this query again and click edit on the In [employer].  3. After updating, or not, click F2 to complete all highlighted (required) fields concerning your review. Select "additional documentation in the medical record" OR "no additional documentation provided".  4. Click Sign note button.  5. The deficiency will fall out of your In Basket *Please let us know if you are not able to complete this workflow by phone or e-mail (listed below).  Please update your documentation within the medical record to reflect your response to this query.                                                                                        10/07/12   Dear Dr. Timoteo Expose,  In a better effort to capture your patient's severity of illness, reflect appropriate length of stay and utilization of resources, a review of the patient medical record has revealed the following indicators.   Based on your clinical judgment, please clarify and document in a progress note and/or discharge summary the clinical condition associated with the following supporting information: In responding to this query please exercise your independent judgment.  The fact that a query is asked, does not imply that any particular answer is desired or expected.  Please clarify stage of CKD. Thank you  Possible Clinical Conditions?   CKD Stage I -  GFR > OR = 90 CKD Stage II - GFR 60-80 CKD Stage III - GFR 30-59 CKD Stage IV - GFR 15-29 CKD Stage V - GFR < 15 ESRD (End Stage Renal Disease) Other condition_____________ Cannot Clinically determine   Supporting Information:  Risk Factors:  Chronic kidney disease. BUN and creatinine are stable at 40 and  1.8. History of  Hypertension, Diabetes, pulmonary hypertension & CHF  Diagnostics:  BUN/CR/GFR 6/10 = 41/1.89/23 6/11 = 39/1.86/23  Treatment: Monitoring BMP Saline locked IV Monitoring I&O Daily weights  You may use possible, probable, or suspect with inpatient documentation. possible, probable, suspected diagnoses MUST be documented at the time of discharge  Reviewed: Dr. Ouida Sills answered in [facility] pn=CKD stage III   Thank You,  Harless Litten RN, MSN Clinical Documentation Specialist: Office# [phone] [facility] Health Information Management [place]

## 2012-10-07 NOTE — Consult Note (Addendum)
CARDIOLOGY CONSULT NOTE  Patient ID: FRANCI OSHANA MRN: [id number] DOB/AGE: January 01, 1924 79 y.o.  Admit date: 10/05/2012 Referring Physician: Carylon Perches MD Primary PhysicianFAGAN,ROY, MD Primary Cardiologist:  Bing MD Reason for Consultation: CHF, Atrial fibrillation Active Problems:   CHF (congestive heart failure)   Aortic stenosis   Community acquired pneumonia  HPI: Mrs. Breuer is a 79 y/o female with multiple medical issues, admitted with CHF, with known history of Atrial fib,(on dabgatran)  AoV stenosis, COPD, and hypertension. She presented to the ER with increasing dyspnea and edema. Pro-BNP 4853 with CXR demonstrating mild pulmonary vascular congestion and emphysema, with small right pleural effusion with infiltration or atelectasis in the right lung possibly representing pneumonia. She was treated with IV lasix and placed on IV Rocephin and Zithromax. She has diuresed greater than 2 liters with improvement in symptoms and edema.   She is a resident of [facility] and is limited on salt intake. She is there after a 3 month admission to SNF after removal of skin cancer from her ankle.  She states symptoms began about 5 days earlier. She denies rapid HR, chest pain or dizziness. She is followed by oncology for chronic anemia and monoclonal gammopathy with Procrit. Hgb was 12.0/Hct 36.8 on admission. EKG shows new T-wave inversion in the lateral leads, continued inferor T-wave inversion, atrial fibrillation which is rate controlled..   Review of systems complete and found to be negative unless listed above   Past Medical History  Diagnosis Date  . CHF (congestive heart failure)   . Diabetes mellitus   . Hypertension   . COPD (chronic obstructive pulmonary disease)   . Vertigo   . Cancer     breast with mastectomy  . Stroke       ?  . Skin disorder      Epidermolysis bullosa.  . Pulmonary hypertension   . Atrial fibrillation 2009/10/02  . Multiple thyroid nodules   .  Anemia   . Breast cancer   . Anemia of other chronic disease 06/09/2012    Family History  Problem Relation Age of Onset  . Colon cancer Neg Hx   . Heart attack Son     age 65s, suspected MI  . Diabetes Mother     History   Social History  . Marital Status: Widowed    Spouse Name: N/A    Number of Children: 1  . Years of Education: N/A   Occupational History  .     Social History Main Topics  . Smoking status: Never Smoker   . Smokeless tobacco: Never Used  . Alcohol Use: No  . Drug Use: No  . Sexually Active: No   Other Topics Concern  . Not on file   Social History Narrative   Son deceased suddenly in his 37s, 02-Oct-2009, ?MI.    Past Surgical History  Procedure Laterality Date  . Cholecystectomy    . Hemorrhoid surgery    . Abdominal hysterectomy  1991    complete hysterectomy for cancer  . Mastectomy  2005    left breast, tamoxifin  . Goiter    . Hip fracture surgery  October 02, 2008    right hip replacement  . Hip fracture surgery  2004/10/02    left hip replacement  . Colonoscopy  ?    remote  . Cataract extraction, bilateral       Prescriptions prior to admission  Medication Sig Dispense Refill  . acetaminophen (TYLENOL) 325 MG tablet Take 650  mg by mouth every 6 (six) hours as needed for pain.      Marland Kitchen albuterol (PROAIR HFA) 108 (90 BASE) MCG/ACT inhaler Inhale 2 puffs into the lungs every 4 (four) hours as needed for wheezing.      Marland Kitchen atorvastatin (LIPITOR) 10 MG tablet Take 10 mg by mouth at bedtime.      . bisacodyl (DULCOLAX) 5 MG EC tablet Take 10 mg by mouth daily as needed for constipation.      . Cholecalciferol (VITAMIN D) 2000 UNITS CAPS Take 1 capsule by mouth every morning.        . citalopram (CELEXA) 40 MG tablet Take 0.5 tablets (20 mg total) by mouth daily. For mood  30 tablet  12  . clonazePAM (KLONOPIN) 0.5 MG tablet Take 0.5 mg by mouth 3 (three) times daily as needed for anxiety (agitatuion).      . dabigatran (PRADAXA) 150 MG CAPS Take 75 mg by mouth 2  (two) times daily.       Marland Kitchen diltiazem (CARDIZEM CD) 240 MG 24 hr capsule Take 240 mg by mouth daily.        Marland Kitchen docusate sodium (COLACE) 100 MG capsule Take 200 mg by mouth 2 (two) times daily as needed for constipation.      . ferrous sulfate 325 (65 FE) MG tablet Take 325 mg by mouth daily with breakfast.      . furosemide (LASIX) 20 MG tablet Take 20 mg by mouth 2 (two) times daily.        . insulin lispro (HUMALOG) 100 UNIT/ML injection Inject 4 Units into the skin 3 (three) times daily before meals.      Marland Kitchen LANTUS SOLOSTAR 100 UNIT/ML injection Inject 12 Units into the skin daily.       Marland Kitchen loratadine (CLARITIN) 10 MG tablet Take 10 mg by mouth daily.      . metoprolol (LOPRESSOR) 50 MG tablet Take 50 mg by mouth 2 (two) times daily.      . mirtazapine (REMERON) 15 MG tablet Take 7.5 mg by mouth at bedtime.      . pantoprazole (PROTONIX) 40 MG tablet Take 40 mg by mouth every 12 (twelve) hours.       . polyethylene glycol (MIRALAX / GLYCOLAX) packet Take 17 g by mouth daily as needed (constipation).      . potassium chloride SA (K-DUR,KLOR-CON) 20 MEQ tablet Take 20 mEq by mouth 2 (two) times daily.      Marland Kitchen triamcinolone cream (KENALOG) 0.1 % Apply 1 application topically daily. Apply to lower extremities once daily      . NOVOLOG FLEXPEN 100 UNIT/ML injection Inject 2 Units into the skin 3 (three) times daily before meals.        Echocardiogram; 10/06/2012 Left ventricle: The cavity size was moderately reduced. There was mild to moderate concentric hypertrophy. Systolic function was normal. The estimated ejection fraction was in the range of 55% to 60%. Wall motion was normal; there were no regional wall motion abnormalities. - Ventricular septum: Septal motion showed dyssynergy. The contour showed diastolic flattening. - Aortic valve: Mildly to moderately calcified annulus. Trileaflet; mildly thickened leaflets. - Mitral valve: Calcified annulus. Mildly thickened, mildly calcified leaflets  . Mild regurgitation. - Left atrium: The atrium was mildly to moderately dilated. - Right ventricle: The cavity size was moderately dilated. Wall thickness was normal. Systolic function was mildly to moderately reduced. - Right atrium: The atrium was moderately dilated. - Atrial septum: No defect or  patent foramen ovale was identified. - Tricuspid valve: Mild-moderate regurgitation. - Pulmonary arteries: Systolic pressure was moderately increased. PA peak pressure: 60mm Hg (S). - Pericardium, extracardiac: There was a small right pleural effusion. Impressions:  - Compared to the previous study performed 12/18/2009, pulmonary hypertension now present with RV enlargement and dysfunction and a right pleural effusion.  Physical Exam: Blood pressure 109/57, pulse 97, temperature 98.2 F (36.8 C), temperature source Oral, resp. rate 22, height 5\' 2"  (1.575 m), weight 138 lb 8 oz (62.823 kg), SpO2 90.00%.  General: Well developed, well nourished, in no acute distress Head: Eyes PERRLA, No xanthomas.   Normal cephalic and atramatic  Lungs: Clear in the upper lobes, mild upper airway wheezes, crackles in the right base, with diminished breath sounds in the left base. Marland Kitchen Heart: HRIR normal S1, increased S2, with 2/6 systolic decrescendo murmur at the left sternal border with preserved A2. .  Pulses are 2+ & equal.            No carotid bruit. Moderate JVD at 10 cm.  No abdominal bruits. No femoral bruits. Abdomen: Bowel sounds are positive, abdomen with mild distention and non-tender without masses or                  Hernia's noted. Msk:  Back normal, normal gait. Normal strength and tone for age. Extremities: No clubbing, cyanosis , some skin thickening with 1+edema. Some blistering is noted , with ACE wraps bilaterally.   DP +1; 1+ presacral edema Neuro: Alert and oriented X 3. Psych:  Good affect, responds appropriately  Weight: Patient believes baseline is approximately 138 pounds; 135 in  11/2010, but 119 in 05/2012. Based upon her exam, she likely needs to be closer to 120 then to 140.  Labs:   Lab Results  Component Value Date   WBC 4.4 10/06/2012   HGB 11.3* 10/06/2012   HCT 35.2* 10/06/2012   MCV 99.4 10/06/2012   PLT 148* 10/06/2012    Recent Labs Lab 10/06/12 0545 10/07/12 0457  NA 141 143  K 4.0 3.7  CL 104 104  CO2 24 26  BUN 40* 39*  CREATININE 1.80* 1.86*  CALCIUM 8.9 9.0  PROT 7.0  --   BILITOT 0.5  --   ALKPHOS 111  --   ALT 8  --   AST 17  --   GLUCOSE 128* 79   Lab Results  Component Value Date   CKTOTAL 28 01/25/2010   CKMB 0.8 01/25/2010   TROPONINI <0.30 10/06/2012    Lab Results  Component Value Date   CHOL 77 10/07/2012   Lab Results  Component Value Date   HDL 52 10/07/2012   Lab Results  Component Value Date   LDLCALC 16 10/07/2012   Lab Results  Component Value Date   TRIG 43 10/07/2012   Lab Results  Component Value Date   CHOLHDL 1.5 10/07/2012   No results found for this basename: LDLDIRECT    BNP (last 3 results)  Recent Labs  10/06/12 0050  PROBNP 4853.0*   Radiology: Dg Chest 2 View  10/06/2012   *RADIOLOGY REPORT*  Clinical Data: The shortness of breath and leg swelling.  CHEST - 2 VIEW  Comparison: 01/24/2010  Findings: Cardiac enlargement similar to previous study.  Prominent pulmonary vascularity suggesting mild congestion.  No edema.  There is a small right pleural effusion with infiltration or atelectasis in the right lung base.  Changes could represent pneumonia.  No blunting of  left costophrenic angle.  No pneumothorax.  Old right rib fractures.  Postoperative changes with left mastectomy and surgical clips in the left axilla.  Calcified and tortuous aorta. Probable underlying emphysematous changes.  Mild compression deformities of several thoracic vertebrae suggesting osteoporosis.  IMPRESSION: Cardiac enlargement with mild pulmonary vascular congestion and diffuse emphysematous changes present.  There is a small  right pleural effusion with infiltration or atelectasis in the right lung base possibly representing pneumonia.   Original Report Authenticated By: Burman Nieves, M.D.   Ct Chest Wo Contrast  10/06/2012   *RADIOLOGY REPORT*  Clinical Data: Shortness of breath, leg swelling, evaluate pleural effusion.  History of CHF, COPD, and breast cancer  CT CHEST WITHOUT CONTRAST  Technique:  Multidetector CT imaging of the chest was performed following the standard protocol without IV contrast.  Comparison: Chest radiograph dated 10/06/2012.  CT chest dated 12/15/2009.  Findings: Moderate right pleural effusion.  Associated right lower lobe compressive atelectasis.  Minimal patchy opacity / atelectasis at the lateral right lung base (series 3/image 42).  No frank interstitial edema.  No suspicious pulmonary nodules.  No pneumothorax.  2.4 cm heterogeneous posterior left thyroid nodule (series 2/image 6), similar to 2011.  The heart is top normal in size.  No pericardial effusion. Coronary atherosclerosis.  Atherosclerotic calcifications of the aortic arch.  No suspicious mediastinal or axillary lymphadenopathy.  Status post left mastectomy.  Visualized upper abdomen is notable for vascular calcifications and upper abdominal ascites.  IMPRESSION: Moderate right pleural effusion with associated right lower lobe atelectasis.  No frank interstitial edema.  Upper abdominal ascites, incompletely visualized.   Original Report Authenticated By: Charline Bills, M.D.   US Abdomen Limited  10/06/2012   *RADIOLOGY REPORT*  Clinical Data: Abdominal swelling and clinical suspicion of ascites.  LIMITED ABDOMINAL ULTRASOUND  Comparison:  The CT of the abdomen on 12/13/2010.  Findings: A small to moderate amount of ascites is identified throughout the peritoneal cavity.  Several pockets are localized without a dominant pocket identified.  The bowel subjectively appears prominent by ultrasound and may be dilated.  IMPRESSION: Small  moderate amount of ascites throughout the peritoneal cavity. The bowel appears potentially dilated by ultrasound.   Original Report Authenticated By: Irish Lack, M.D.   ZOX:WRUEAV fibrillation with RBBB, T-wave inversion in the inferio/lateral leads. Rate of 77 bpm.  ASSESSMENT AND PLAN:   1.Acute on chronic diastolic CHF: She has diuresed 6 lbs from highest weight recorded on IV lasix 40 mg daily. Edema and breathing status have improved. Echo demonstrates normal EF with pulmonary hypertension (not a new finding). She is on lasix 20 mg daily as OP. Would continue IV diureses  Unknown dry wt. On last admission she was at 140 lbs. Now 138 lbs . Creatinine this am 1. 86. (Baseline 1.4-1.8)  2. Pneumonia: Now being treated with antibiotics per Dr.Fagan  3. Atrial fibrillation: Rate controlled currently on diltiazem 240 mg daily. She remains on pradaxa BID.  4. Aortic Valve insufficiency: Mildly to moderately calcified annulus.Normal cusp separation.No stenosis per echo this am.  Bettey Mare. Lyman Bishop NP [facility] 10/07/2012, 9:31 AM  Cardiology Attending Patient interviewed and examined. Discussed with Joni Reining, NP.  Above note annotated and modified based upon my findings.  Evidence of fluid overload plus pulmonary hypertension. The latter is verified on echocardiography. Systolic murmur is fairly impressive and likely represents tricuspid regurgitation based upon her echocardiographic findings. I suspect TR is secondary to pulmonary hypertension rather than primary valvular heart  disease. Causes of pulmonary hypertension should be sought. Patient has had hypoxemia in the past but no significant CO2 retention, no history of smoking or toxic exposures and no symptoms to suggest significant chronic lung disease. Chronic or recurrent pulmonary embolism is a consideration.  There is no history of pericardial disease, but this would be a diagnostic concern as well-no pericardial  thickening was reported on her chest CT.  She has had progressive elevation of creatinine over the past 4 months, and chronic kidney disease may be contributing to fluid retention as well.  I doubt that her only problem is diastolic dysfunction. Diuresis will proceed as diagnostic testing is performed.  Force Bing, MD 10/07/2012, 3:10 PM

## 2012-10-08 DIAGNOSIS — I279 Pulmonary heart disease, unspecified: Secondary | ICD-10-CM

## 2012-10-08 DIAGNOSIS — I2781 Cor pulmonale (chronic): Secondary | ICD-10-CM

## 2012-10-08 LAB — BASIC METABOLIC PANEL
BUN: 37 mg/dL — ABNORMAL HIGH (ref 6–23)
Calcium: 8.6 mg/dL (ref 8.4–10.5)
GFR calc Af Amer: 30 mL/min — ABNORMAL LOW (ref >90)
GFR calc non Af Amer: 26 mL/min — ABNORMAL LOW (ref >90)
Potassium: 4.3 mEq/L (ref 3.5–5.1)
Sodium: 142 mEq/L (ref 135–145)

## 2012-10-08 LAB — GLUCOSE, CAPILLARY
Glucose-Capillary: 123 mg/dL — ABNORMAL HIGH (ref 70–99)
Glucose-Capillary: 126 mg/dL — ABNORMAL HIGH (ref 70–99)
Glucose-Capillary: 151 mg/dL — ABNORMAL HIGH (ref 70–99)

## 2012-10-08 MED ORDER — PANTOPRAZOLE SODIUM 40 MG PO TBEC
40.0000 mg | DELAYED_RELEASE_TABLET | Freq: Every day | ORAL | Status: DC
  Administered 2012-10-08 – 2012-10-09 (×2): 40 mg via ORAL
  Filled 2012-10-08 (×2): qty 1

## 2012-10-08 MED ORDER — ALBUTEROL SULFATE (5 MG/ML) 0.5% IN NEBU
2.5000 mg | INHALATION_SOLUTION | Freq: Three times a day (TID) | RESPIRATORY_TRACT | Status: DC
  Administered 2012-10-08 – 2012-10-09 (×4): 2.5 mg via RESPIRATORY_TRACT
  Filled 2012-10-08 (×4): qty 0.5

## 2012-10-08 MED ORDER — IPRATROPIUM BROMIDE 0.02 % IN SOLN
0.5000 mg | Freq: Three times a day (TID) | RESPIRATORY_TRACT | Status: DC
  Administered 2012-10-08 – 2012-10-09 (×4): 0.5 mg via RESPIRATORY_TRACT
  Filled 2012-10-08 (×4): qty 2.5

## 2012-10-08 NOTE — Progress Notes (Signed)
Inpatient Diabetes Program Recommendations  AACE/ADA: New Consensus Statement on Inpatient Glycemic Control (2013)  Target Ranges:  Prepandial:   less than 140 mg/dL      Peak postprandial:   less than 180 mg/dL (1-2 hours)      Critically ill patients:  140 - 180 mg/dL   Results for AHRIYAH, VANNEST (MRN [id number]) as of 10/08/2012 08:14  Ref. Range 10/07/2012 07:49 10/07/2012 11:48 10/07/2012 16:58 10/07/2012 21:36 10/08/2012 07:36  Glucose-Capillary Latest Range: 70-99 mg/dL 86 191 (H) 478 (H) 69 (L) 95    Inpatient Diabetes Program Recommendations Insulin - Basal: May want to decrease Lantus by one or two units while inpatient. HgbA1C: Please consider ordering an A1C to determine glycemic control over the past 2-3 months.  Note: Patient has a history of diabetes and takes Lantus 12 units daily and Humalog 4 units TID as an outpatient for diabetes management.  Currently, patient is ordered to receive Lantus 12 units daily and Novolog 0-9 units AC for inpatient glycemic control.  Blood glucose noted to be 69 mg/dl last night at bedtime and fasting blood glucose this morning was 95 mg/dl.  According to the chart, the patient ate 25% of breakfast, 75% of lunch, and 50% of supper yesterday.  May want to consider decreasing Lantus by one or two units to prevent further episodes of hypoglycemia while inpatient and not eating well.  Also, please consider ordering an A1C.  Will continue to follow.  Thanks, Orlando Penner, RN, MSN, CCRN Diabetes Coordinator Inpatient Diabetes Program [phone]

## 2012-10-08 NOTE — Progress Notes (Signed)
NAME:  KERISSA, COIA                   ACCOUNT NO.:  [id number]  MEDICAL RECORD NO.:  [id number]  LOCATION:  A223                          FACILITY:  [facility]  PHYSICIAN:  Kingsley Callander. Ouida Sills, MD       DATE OF BIRTH:  10-04-1923  DATE OF PROCEDURE: DATE OF DISCHARGE:                                PROGRESS NOTE   SUBJECTIVE:  Ms. Herbig is feeling better.  The swelling in her legs has significantly improved.  She is breathing comfortably.  Her CT of the chest reveals a right pleural effusion which is moderate in size.  There is no evidence of pneumonia.  She does not have pulmonary edema.  Her CT suggested possible ascites.  She underwent an abdominal ultrasound which revealed a moderate amount of ascites in the abdomen.  Her echocardiogram reveals a normal left ventricular ejection fraction of 55- 60%.  She does have pulmonary hypertension now with prominent RV enlargement and dysfunction and a right pleural effusion is noted on the echo also.  Cardiology consultation remains pending.  She had a net negative output yesterday of 2277.  OBJECTIVE:  VITAL SIGNS:  Temperature is 98.2, pulse 97, respirations 22, blood pressure 109/57. GENERAL:  Alert and in no distress. LUNGS:  Clear. HEART:  Irregularly irregular with a grade 2 systolic murmur. ABDOMEN:  Soft and nontender with no palpable organomegaly. EXTREMITIES:  Near resolution of her edema.  IMPRESSION AND PLAN: 1. Congestive heart failure, chronic atrial fibrillation, and     pulmonary hypertension, improved.  Decrease IV Lasix to 40 mg IV     q.24 h.  Cardiology consult today.  It remains unclear whether her     significant fluid accumulation is related to her heart status or     possibly her underlying protein status.  Her albumin is 3.6, total     protein is 7.0.  She does have a monoclonal gammopathy.  We will     check a urinalysis and a lipid profile to exclude nephrotic     syndrome. 2. Chronic kidney disease stage III.  BUN and  creatinine are stable at     39 and 1.86.  Potassium is 3.7. 3. Diabetes.  Continue Lantus with sliding scale NovoLog.  Glucose     this morning was 79. 4. Chronic atrial fibrillation, stable.  Continue diltiazem,     metoprolol, and Pradaxa. 5. History of depression and anxiety.  Continue citalopram and     Remeron.     Kingsley Callander. Ouida Sills, MD    ROF/MEDQ  D:  10/07/2012  T:  10/08/2012  Job:  [id number]

## 2012-10-08 NOTE — Progress Notes (Signed)
Consulting cardiologist: Dr. Bland[place] Bing  SUBJECTIVE: Feeling better, breathing better.  LABS: Basic Metabolic Panel:  Recent Labs  16/10/96 0457 10/08/12 0510  NA 143 142  K 3.7 4.3  CL 104 106  CO2 26 28  GLUCOSE 79 91  BUN 39* 37*  CREATININE 1.86* 1.71*  CALCIUM 9.0 8.6   Liver Function Tests:  Recent Labs  10/06/12 0545  AST 17  ALT 8  ALKPHOS 111  BILITOT 0.5  PROT 7.0  ALBUMIN 3.6   CBC:  Recent Labs  10/06/12 0050 10/06/12 0545  WBC 4.0 4.4  HGB 12.0 11.3*  HCT 36.8 35.2*  MCV 99.2 99.4  PLT 142* 148*   Cardiac Enzymes:  Recent Labs  10/06/12 0050  TROPONINI <0.30    Recent Labs  10/07/12 0457  CHOL 77  HDL 52  LDLCALC 16  TRIG 43  CHOLHDL 1.5   RADIOLOGY: US Abdomen Limited  10/06/2012   *RADIOLOGY REPORT*  Clinical Data: Abdominal swelling and clinical suspicion of ascites.  LIMITED ABDOMINAL ULTRASOUND  Comparison:  The CT of the abdomen on 12/13/2010.  Findings: A small to moderate amount of ascites is identified throughout the peritoneal cavity.  Several pockets are localized without a dominant pocket identified.  The bowel subjectively appears prominent by ultrasound and may be dilated.  IMPRESSION: Small moderate amount of ascites throughout the peritoneal cavity. The bowel appears potentially dilated by ultrasound.   Original Report Authenticated By: Irish Lack, M.D.    PHYSICAL EXAM BP 111/65  Pulse 82  Temp(Src) 97.7 F (36.5 C) (Oral)  Resp 20  Ht 5\' 2"  (1.575 m)  Wt 138 lb 8 oz (62.823 kg)  BMI 25.33 kg/m2  SpO2 92% General: Frail, in no acute distress Lungs: Clear bilaterally. Heart: HRRR S1 S2, harsh 2/6 systolic murmur at the LSB  .  Pulses are 2+ & equal. At 12 cm. No carotid bruit. Elevated  JVP.  Extremities: 1+ pre-tibial edema. Thickened skin with mild woody edema. Small blisters are noted.  DP diminished. Neuro: Alert and oriented X 3. Psych:  Good affect, responds appropriately  TELEMETRY:  Reviewed telemetry pt in :Atrial fibrillation with RBBB.   ASSESSMENT AND PLAN:  1. Cor pulmonale with volume overload: She continues to diuresis but not as strong as yesterday. She is breathing better. Dry wt should be in the 120;s per Dr.Rothbarts assessment. She continues to have mild ascitess per abdominal ultrasound. Continue IV diureses given daily. Can consider increasing to BID if urine output slows.  Watch renal function closely.  2. Pneumonia: Continue treatment with antibiotics per Dr.Fagan  3. Pulmonary Hypertension; Dr. Marvel Plan note reviewed, TR is likely secondary. She has had a long history of this without clear lung disease, but has been around long term second hand smoking. No evidence of PE by previous chest CTA 2011. No more recent assessment. She is already on continuous oxygen.  3. Atrial fibrillation: Heart rate is well controlled. She is on diltiazem and pradaxa. No evidence of rapid rhythm contributing to CHF. Continue rate control.  4.Anemia: Followed by oncology for Procrit injections. H/H this am 11.3/35.2 She seems stable with this on review of her labs over the last few months.  5. CKD: Baseline 1.4-1.8. Remains stable and actually has improved even with IV lasix.  Bettey Mare. Lyman Bishop NP [facility] 10/08/2012, 9:08 AM   Attending note:  Patient seen and examined. Modified above note by Ms. Lawrence NP based on my findings. Ms. Moder reports  symptomatic improvement in terms of her breathing and also edema. She still appears frail at baseline, and requires continuous oxygen supplementation. Approximately 2900 cc out more than last 48 hours. Heart rate and blood pressure well controlled. No change to current regimen.  Jonelle Sidle, M.D., F.A.C.C.

## 2012-10-08 NOTE — Clinical Social Work Note (Signed)
CSW updated [facility] on patient progress, facility willing to take patient at discharge.   Santa Genera, LCSW Clinical Social Worker [phone])

## 2012-10-09 LAB — BASIC METABOLIC PANEL
Chloride: 105 mEq/L (ref 96–112)
Creatinine, Ser: 1.67 mg/dL — ABNORMAL HIGH (ref 0.50–1.10)
GFR calc Af Amer: 31 mL/min — ABNORMAL LOW (ref >90)

## 2012-10-09 LAB — GLUCOSE, CAPILLARY: Glucose-Capillary: 123 mg/dL — ABNORMAL HIGH (ref 70–99)

## 2012-10-09 LAB — PRO B NATRIURETIC PEPTIDE: Pro B Natriuretic peptide (BNP): 4038 pg/mL — ABNORMAL HIGH (ref 0–450)

## 2012-10-09 MED ORDER — FUROSEMIDE 40 MG PO TABS: 40.0000 mg | ORAL_TABLET | Freq: Two times a day (BID) | ORAL | Status: DC

## 2012-10-09 MED ORDER — PANTOPRAZOLE SODIUM 40 MG PO TBEC: 40.0000 mg | DELAYED_RELEASE_TABLET | Freq: Every day | ORAL | Status: DC

## 2012-10-09 NOTE — Progress Notes (Signed)
PT GIVEN DISCHARGE INSTRUCTIONS. [facility] CALLED REPORT ON PT. THEY WILL BE COMING TO PICK UP PT AROLUND 1400. PT ALERT AND ORIENTED. NSL LOCK D/C'D. O2 REMAINS IN PLACE AT 1L/MIN. PT STILL HAS SOME EXPIRATORY WHHEZING. ASSISTED BY THIS RN TO GET DRESSED.

## 2012-10-09 NOTE — Progress Notes (Signed)
NAME:  SAMREET, EDENFIELD                   ACCOUNT NO.:  [id number]  MEDICAL RECORD NO.:  [id number]  LOCATION:                                 FACILITY:  PHYSICIAN:  Kingsley Callander. Ouida Sills, MD       DATE OF BIRTH:  27-Jul-1923  DATE OF PROCEDURE:  10/08/2012 DATE OF DISCHARGE:                                PROGRESS NOTE   SUBJECTIVE:  Ms. Catherine [name] is breathing comfortably this morning.  She had a negative I and O of -740.  OBJECTIVE:  VITAL SIGNS:  Temperature is 97.7, pulse 82, respirations 20, blood pressure 111/65, oxygen saturation 92%. GENERAL:  Alert and in no distress. LUNGS:  Basilar rales. HEART:  Irregularly irregular with a grade 2 systolic murmur. ABDOMEN:  Soft and nontender. EXTREMITIES:  Edema has resolved.  IMPRESSION/PLAN: 1. Congestive heart failure, pulmonary hypertension, chronic atrial     fibrillation, improving.  Continue IV Lasix. 2. Chronic kidney disease.  BUN and creatinine are stable at 37 and     1.71, potassium is 4.3.  Her urinalysis reveals no protein. 3. Diabetes.  Glucoses yesterday ranged from 69-163.  Your fasting     glucose this morning is 95. 4. Tricuspid insufficiency.  She has been evaluated by Cardiology.  We     will discuss further with cardiology today. 5. Monoclonal gammopathy of undetermined significance.     Kingsley Callander. Ouida Sills, MD     ROF/MEDQ  D:  10/08/2012  T:  10/09/2012  Job:  [id number]

## 2012-10-09 NOTE — Progress Notes (Signed)
Caregiver from [facility] here to take pt back to [facility].transported via w/c.

## 2012-10-09 NOTE — Clinical Social Work Note (Signed)
Addendum faxed to [facility] and facility notified.  Derenda Fennel, [place] [phone]

## 2012-10-09 NOTE — Progress Notes (Signed)
SUBJECTIVE: Feeling better but appears weak. Still has chronic SOB  Active Problems:   CHF (congestive heart failure)   Community acquired pneumonia   Chronic kidney disease   Chronic cor pulmonale   LABS: Basic Metabolic Panel:  Recent Labs  65/78/46 0510 10/09/12 0445  NA 142 142  K 4.3 4.6  CL 106 105  CO2 28 28  GLUCOSE 91 98  BUN 37* 38*  CREATININE 1.71* 1.67*  CALCIUM 8.6 8.4   Fasting Lipid Panel:  Recent Labs  10/07/12 0457  CHOL 77  HDL 52  LDLCALC 16  TRIG 43  CHOLHDL 1.5    PHYSICAL EXAM BP 104/67  Pulse 81  Temp(Src) 97.3 F (36.3 C) (Oral)  Resp 22  Ht 5\' 2"  (1.575 m)  Wt 138 lb 8 oz (62.823 kg)  BMI 25.33 kg/m2  SpO2 92% General: Thin elderly 79 yo  NAD Head:    Normal cephalic and atramatic  Lungs: Rales bilaterally, mild wheeze.  Decreased BS at R base. Heart:  Irreg rate and rhythm  S1, S2.  No S3  Gr III/VI systolic murmur LSB .  Pulses are 2+ & equal.            No carotid bruit. JVP i ncreased.  No abdominal bruits. No femoral bruits. Abdomen: Bowel sounds are positive, abdomen soft and non-tender without masses  Extremities: 1+ edema, R greater than L  Bandage on L Neuro: Alert and oriented X 3. Psych:  Good affect, responds appropriately  TELEMETRY: Reviewed telemetry pt in: Atrial fibrillation with RBBB.  ASSESSMENT AND PLAN:  1. Cor pulmonale with volume overload:  Volume still appears increased some.  Improved from admit..  Wt at 137.  Marland Kitchen She will be transitioned to PO lasix 40 mg bid.   I would recomm checking BNP.  Compare to baseline   Was in 200s as outpatient  In past.   Follow sodium diet at home. Follow up appointment with cardiology will be made.  2. Pneumonia: Continue treatment with antibiotics per Dr.Fagan Wheezes are more prominent today.  3. Pulmonary Hypertension; Dr. Marvel Plan note reviewed, TR is likely secondary. She has had a long history of this without clear lung disease, but has been around long term  second hand smoking. No evidence of PE by previous chest CTA 2011. No more recent assessment. She is already on continuous oxygen.continue on discharge  3. Atrial fibrillation: Heart rate is well controlled. She is on diltiazem and pradaxa. No evidence of rapid rhythm contributing to CHF. Continue rate control.   4.Anemia: Followed by oncology for Procrit injections. H/H this am 11.3/35.2 She seems stable with this on review of her labs over the last few months.   5. CKD: Baseline 1.4-1.8. Creatinine 1.67 today     Bettey Mare. Lyman Bishop NP [facility] 10/09/2012, 7:38 AM  Patient seen and examined.  I have amended PE and assessment to reflect my findings On exam she still appears to have some extra volume on exam. Reasonable to continue higher dose of po lasix. WIll make sure she has close follow up as outpatient.  Dietrich Pates 10/09/2012 9:58 AM

## 2012-10-09 NOTE — Discharge Summary (Signed)
NAME:  [name], [name]                   ACCOUNT NO.:  [id number]  MEDICAL RECORD NO.:  [id number]  LOCATION:  A223                          FACILITY:  [facility]  PHYSICIAN:  Kingsley Callander. Ouida Sills, MD       DATE OF BIRTH:  04/27/1924  DATE OF ADMISSION:  10/05/2012 DATE OF DISCHARGE:  LH                              DISCHARGE SUMMARY   DISCHARGE DIAGNOSES: 1. Acute on chronic diastolic heart failure. 2. Pulmonary hypertension. 3. Chronic atrial fibrillation. 4. Chronic kidney disease. 5. Depression and anxiety. 6. Monoclonal gammopathy of uncertain significance. 7. Hyperlipidemia.  DISCHARGE MEDICATIONS:  Lasix 40 mg b.i.d., diltiazem CD 240 mg daily, metoprolol 50 mg b.i.d., Pradaxa 75 mg b.i.d., vitamin D 2000 units daily, Celexa 20 mg daily, Lantus 12 units daily, ferrous sulfate 325 mg daily, Kenalog 0.1% daily to lower extremities, ProAir 2 puffs q.4 p.r.n., Lipitor 10 mg daily, Remeron 7.5 mg at bedtime, Klonopin 0.5 mg t.i.d. p.r.n., Tylenol 650 mg q.6 h. p.r.n., Dulcolax 10 mg daily p.r.n., Colace 200 mg b.i.d. p.r.n., MiraLax 17 g daily p.r.n.  HOSPITAL COURSE:  This patient is an 79 year old female who presented with shortness of breath and swelling.  She had edema in her upper and lower legs.  She was treated with IV Lasix.  Her chest x-ray revealed pulmonary vascular congestion.  There is initially a question of a right lower lobe pneumonia.  She underwent a CT scan which revealed no pneumonia.  She has a right pleural effusion.  She had an abdominal ultrasound which revealed mild to moderate ascites.  Her urinalysis revealed no protein.  She responded well to diuresis.  She underwent an echocardiogram which revealed a normal left ventricular ejection fraction.  She does have right ventricular dilation and pulmonary hypertension.  She has a tricuspid regurgitation.  She had intermittent wheezing and was treated with inhaled bronchodilators.  Her diabetes was well controlled.  Her  Lantus is being continued but her short-acting insulin at mealtime is being stopped for now.  Her chronic atrial fibrillation rate was well controlled with diltiazem and metoprolol.  She is anticoagulated with Pradaxa without complications.  Her anxiety and depression are treated with mirtazapine and p.r.n. Klonopin.  She has been followed for MGUS.  Her blood counts are stable. She has received Procrit intermittently through [facility]. She continues iron daily.  CONDITION AT DISCHARGE:  Much improved.  She will be seen in followup in my office in 1 week.  Lasix dose is being increased to 40 mg orally b.i.d.  She will also have followup with Cardiology.     Kingsley Callander. Ouida Sills, MD     ROF/MEDQ  D:  10/09/2012  T:  10/09/2012  Job:  [id number]

## 2012-10-09 NOTE — Clinical Social Work Note (Signed)
Patient ready for discharge today, will return to [facility].  [place] staff informed and agreeable to patient return, will transport patient when patient ready to leave [facility].  Discharge packet prepared and placed w shadow chart for transport.  FL2 reviewed w RN and updated as needed.  MD asked to address medications on discharge summary, original discharge summary faxed to [facility].  Awaiting MD addendum.  Patient agreeable to return to ALF, says she has told her family of her discharge.    Santa Genera, LCSW Clinical Social Worker [phone])

## 2012-10-09 NOTE — Discharge Summary (Signed)
NAME:  [name], [name]                   ACCOUNT NO.:  [id number]  MEDICAL RECORD NO.:  [id number]  LOCATION:  A223                          FACILITY:  [facility]  PHYSICIAN:  Kingsley Callander. Ouida Sills, MD       DATE OF BIRTH:  21-Oct-1923  DATE OF ADMISSION:  10/05/2012 DATE OF DISCHARGE:  LH                              DISCHARGE SUMMARY   ADDENDUM:  MEDICATION LIST:  Please add Protonix 40 mg daily.  She will be continued on Protonix 40 mg daily.     Kingsley Callander. Ouida Sills, MD     ROF/MEDQ  D:  10/09/2012  T:  10/09/2012  Job:  [id number]

## 2012-10-14 ENCOUNTER — Encounter (HOSPITAL_COMMUNITY): Payer: Medicare Other | Attending: Oncology

## 2012-10-14 ENCOUNTER — Other Ambulatory Visit (HOSPITAL_COMMUNITY): Payer: Medicare Other

## 2012-10-14 DIAGNOSIS — D638 Anemia in other chronic diseases classified elsewhere: Secondary | ICD-10-CM

## 2012-10-14 LAB — CBC
MCV: 100.8 fL — ABNORMAL HIGH (ref 78.0–100.0)
Platelets: 137 10*3/uL — ABNORMAL LOW (ref 150–400)
RBC: 3.58 MIL/uL — ABNORMAL LOW (ref 3.87–5.11)
WBC: 3.9 10*3/uL — ABNORMAL LOW (ref 4.0–10.5)

## 2012-10-14 NOTE — Progress Notes (Unsigned)
Injection held due to hgb 11.6

## 2012-10-22 ENCOUNTER — Encounter: Payer: Self-pay | Admitting: Adult Health

## 2012-10-22 ENCOUNTER — Ambulatory Visit (INDEPENDENT_AMBULATORY_CARE_PROVIDER_SITE_OTHER): Payer: Medicare Other | Admitting: Adult Health

## 2012-10-22 VITALS — BP 118/64 | HR 71 | Ht 62.0 in | Wt 138.0 lb

## 2012-10-22 DIAGNOSIS — I5032 Chronic diastolic (congestive) heart failure: Secondary | ICD-10-CM

## 2012-10-22 DIAGNOSIS — I4891 Unspecified atrial fibrillation: Secondary | ICD-10-CM

## 2012-10-22 LAB — BASIC METABOLIC PANEL
CO2: 28 mEq/L (ref 19–32)
Chloride: 102 mEq/L (ref 96–112)
Potassium: 4.6 mEq/L (ref 3.5–5.3)
Sodium: 138 mEq/L (ref 135–145)

## 2012-10-22 NOTE — Progress Notes (Signed)
HPI: [name] is an 79 year old patient of [name] we are following for ongoing assessment and management of atrial fibrillation, acute on chronic diastolic CHF, with history of chronic kidney disease hyperlipidemia and depression and anxiety. The patient was most recently admitted to the hospital on 10/05/2012 in the setting of decompensated CHF. The patient is diuresis IV Lasix, echocardiogram was repeated revealing normal LV EF. She was found to have right ventricular dilatation and pulmonary hypertension with tricuspid regurg. Her Lasix dose was increased to 40 mg twice a day on discharge. She was continued on metoprolol, Pradaxa  75 twice a day. Weight during initial assessment was 138 pounds.   She comes today with a complaint. She states that she is feeling better breathing better and is able to get up and walk around better. She [facility] a [name]. Meals are provided for her that are withheld salt. She states that she will be going home the end of July. Her weight has gone up slightly since she has been at [facility] [employer]. And is at 138 pounds similar to her admission weight and beginning of June. She denies any shortness of breath. There is some lower extremity edema noted.           Allergies  Allergen Reactions  . Amoxicillin-Pot Clavulanate Diarrhea    Current Outpatient Prescriptions  Medication Sig Dispense Refill  . acetaminophen (TYLENOL) 325 MG tablet Take 650 mg by mouth every 6 (six) hours as needed for pain.      Marland Kitchen albuterol (PROAIR HFA) 108 (90 BASE) MCG/ACT inhaler Inhale 2 puffs into the lungs every 4 (four) hours as needed for wheezing.      Marland Kitchen atorvastatin (LIPITOR) 10 MG tablet Take 10 mg by mouth at bedtime.      . bisacodyl (DULCOLAX) 5 MG EC tablet Take 10 mg by mouth daily as needed for constipation.      . Cholecalciferol (VITAMIN D) 2000 UNITS CAPS Take 1 capsule by mouth every morning.        . citalopram (CELEXA) 20 MG tablet  Take 20 mg by mouth daily.      . clonazePAM (KLONOPIN) 0.5 MG tablet Take 0.5 mg by mouth 3 (three) times daily as needed for anxiety (agitatuion).      . dabigatran (PRADAXA) 75 MG CAPS Take 75 mg by mouth every 12 (twelve) hours.      Marland Kitchen diltiazem (CARDIZEM CD) 240 MG 24 hr capsule Take 240 mg by mouth daily.        Marland Kitchen docusate sodium (COLACE) 100 MG capsule Take 200 mg by mouth 2 (two) times daily as needed for constipation.      . ferrous sulfate 325 (65 FE) MG tablet Take 325 mg by mouth daily with breakfast.      . furosemide (LASIX) 40 MG tablet Take 1 tablet (40 mg total) by mouth 2 (two) times daily.  60 tablet  12  . LANTUS SOLOSTAR 100 UNIT/ML injection Inject 12 Units into the skin daily.       Marland Kitchen loratadine (CLARITIN) 10 MG tablet Take 10 mg by mouth daily.      . metoprolol (LOPRESSOR) 50 MG tablet Take 50 mg by mouth 2 (two) times daily.      . mirtazapine (REMERON) 15 MG tablet Take 7.5 mg by mouth at bedtime.      . pantoprazole (PROTONIX) 40 MG tablet Take 1 tablet (40 mg total) by mouth daily.  30  tablet  12  . polyethylene glycol (MIRALAX / GLYCOLAX) packet Take 17 g by mouth daily as needed (constipation).      . potassium chloride SA (K-DUR,KLOR-CON) 20 MEQ tablet Take 20 mEq by mouth 2 (two) times daily.      Marland Kitchen triamcinolone cream (KENALOG) 0.1 % Apply 1 application topically daily. Apply to lower extremities once daily       No current facility-administered medications for this visit.    Past Medical History  Diagnosis Date  . CHF (congestive heart failure)   . Diabetes mellitus   . Hypertension   . COPD (chronic obstructive pulmonary disease)   . Vertigo   . Cancer     breast with mastectomy  . Stroke       ?  . Skin disorder      Epidermolysis bullosa.  . Pulmonary hypertension   . Atrial fibrillation 2011  . Multiple thyroid nodules   . Anemia   . Breast cancer   . Anemia of other chronic disease 06/09/2012    Past Surgical History  Procedure  Laterality Date  . Cholecystectomy    . Hemorrhoid surgery    . Abdominal hysterectomy  1991    complete hysterectomy for cancer  . Mastectomy  2005    left breast, tamoxifin  . Goiter    . Hip fracture surgery  2010    right hip replacement  . Hip fracture surgery  2006    left hip replacement  . Colonoscopy  ?    remote  . Cataract extraction, bilateral      QMV:HQIONG of systems complete and found to be negative unless listed above  PHYSICAL EXAM BP 118/64  Ht 5\' 2"  (1.575 m)  Wt 138 lb (62.596 kg)  BMI 25.23 kg/m2  General: Well developed, well nourished, in no acute distress, sitting in a wheelchair. Head: Eyes PERRLA, No xanthomas.   Normal cephalic and atramatic  Lungs: Clear bilaterally to auscultation mild bibasilar crackles, wearing O2 at 2 L. Heart: HRRR S1 S2, without MRG.  Pulses are 2+ & equal.            No carotid bruit. No JVD.  No abdominal bruits. No femoral bruits. Abdomen: Bowel sounds are positive, abdomen soft and non-tender without masses or                  Hernia's noted. Msk:  Back normal, normal gait. Diminished strength and tone for age. Extremities: No clubbing, cyanosis, 1+ to 2+ woody edema in the lower extremities.  DP +1 Neuro: Alert and oriented X 3. Very hard of hearing Psych:  Good affect, responds appropriately  EKG: Atrial fibrillation with right bundle branch block T-wave inversion noted V4 5 and 6 in lead 3 heart rate 71 beats per minute  ASSESSMENT AND PLAN

## 2012-10-22 NOTE — Assessment & Plan Note (Signed)
BMET and proBNP are being ordered. We will make further recommendations depending upon lab results.

## 2012-10-22 NOTE — Assessment & Plan Note (Addendum)
Heart rate is currently well-controlled. Will continue her on her current medications as she appears to be tolerating them with good response. She will continue on anticoagulation with Pradaxa twice a day

## 2012-10-22 NOTE — Patient Instructions (Addendum)
Your physician recommends that you schedule a follow-up appointment in: 1 month  Your physician has recommended you make the following change in your medication: Take 60 mg lasix 1 time only tomorrow morning then continue back on 40 mg that night. Continue Lasix 40 mg BID  Your physician recommends that you return for lab work today. BMET, PRO BNP

## 2012-10-22 NOTE — Assessment & Plan Note (Addendum)
There is some evidence of fluid overload noted in the lower extremities with 1+ to 2+ pitting edema in the dependent position with woody skin changes. I will increase her Lasix to 60 mg in the morning in 40 mg in the PM for one day. I have given written instructions to [facility] skilled nursing facility to weigh her daily. They report a weight gain of anything sustained over 135 pounds. The patient have a BMET and a pro BNP drawn today. Will see her again in one month. She is on calcium channel blockers for blood pressure and heart rate control which can also cause some dependent edema. I have asked her to keep her legs elevated when she is sitting for long periods of time. It also provided in written instructions

## 2012-10-28 ENCOUNTER — Telehealth: Payer: Self-pay | Admitting: *Deleted

## 2012-10-28 NOTE — Telephone Encounter (Signed)
ERROR

## 2012-11-04 ENCOUNTER — Encounter (HOSPITAL_COMMUNITY): Payer: Medicare Other

## 2012-11-04 ENCOUNTER — Other Ambulatory Visit (HOSPITAL_COMMUNITY): Payer: Medicare Other

## 2012-11-04 ENCOUNTER — Encounter (HOSPITAL_COMMUNITY): Payer: Medicare Other | Attending: Oncology

## 2012-11-04 DIAGNOSIS — D638 Anemia in other chronic diseases classified elsewhere: Secondary | ICD-10-CM | POA: Insufficient documentation

## 2012-11-04 LAB — CBC
HCT: 37 % (ref 36.0–46.0)
Hemoglobin: 11.9 g/dL — ABNORMAL LOW (ref 12.0–15.0)
MCH: 32.7 pg (ref 26.0–34.0)
MCV: 101.6 fL — ABNORMAL HIGH (ref 78.0–100.0)
RBC: 3.64 MIL/uL — ABNORMAL LOW (ref 3.87–5.11)

## 2012-11-04 NOTE — Progress Notes (Signed)
Hgb 11.9 - no injection needed 

## 2012-11-26 ENCOUNTER — Encounter: Payer: Self-pay | Admitting: *Deleted

## 2012-11-26 ENCOUNTER — Encounter: Payer: Self-pay | Admitting: Adult Health

## 2012-11-26 ENCOUNTER — Telehealth: Payer: Self-pay | Admitting: *Deleted

## 2012-11-26 ENCOUNTER — Ambulatory Visit (INDEPENDENT_AMBULATORY_CARE_PROVIDER_SITE_OTHER): Payer: Medicare Other | Admitting: Adult Health

## 2012-11-26 VITALS — BP 119/62 | HR 84 | Ht 63.0 in | Wt 139.0 lb

## 2012-11-26 DIAGNOSIS — I10 Essential (primary) hypertension: Secondary | ICD-10-CM

## 2012-11-26 DIAGNOSIS — I5032 Chronic diastolic (congestive) heart failure: Secondary | ICD-10-CM

## 2012-11-26 DIAGNOSIS — I4891 Unspecified atrial fibrillation: Secondary | ICD-10-CM

## 2012-11-26 NOTE — Progress Notes (Signed)
HPI: [name] is an 79 year old patient of Dr. Dietrich Pates we are following for ongoing assessment and management of atrial fibrillation, chronic diastolic CHF, with history of chronic kidney disease, hyperlipidemia, depression and anxiety. On last visit on 10/22/2012 the patient was seen as a posthospitalization followup for recent admission for CHF. The patient is a resident of [facility]. On last visit her Lasix was increased to 60 mg in the morning and 40 mg in the evening secondary to increased lower extremity edema times one day only.    She was advised to be weighed every day and report weight gain if anything over 135 pounds. Followup labs were completed. Sodium was 138 potassium 4.6 card 102 CO2 28 creatinine 1.7. Pro BNP was elevated at 6911. She was also found to be anemic and is being followed by oncology. She comes today for reevaluation of her symptoms and need to adjust medications.   She brings with her a calendar which has weight recordings for each day. She remained stable and her weight without significant weight changes demonstrating weight is between 136 and 137 respectively. She denies any recurrence of shortness of breath or fluid retention which she is acutely aware of. She is oxygen dependent but is not having increased problems breathing. She is hopeful about returning home within the next 2 weeks, as she has been at [facility] for almost a year now.  Allergies  Allergen Reactions  . Amoxicillin-Pot Clavulanate Diarrhea    Current Outpatient Prescriptions  Medication Sig Dispense Refill  . acetaminophen (TYLENOL) 325 MG tablet Take 650 mg by mouth every 6 (six) hours as needed for pain.      Marland Kitchen albuterol (PROAIR HFA) 108 (90 BASE) MCG/ACT inhaler Inhale 2 puffs into the lungs every 4 (four) hours as needed for wheezing.      Marland Kitchen atorvastatin (LIPITOR) 10 MG tablet Take 10 mg by mouth at bedtime.      . bisacodyl (DULCOLAX) 5 MG EC tablet Take 10 mg by mouth daily as  needed for constipation.      . Cholecalciferol (VITAMIN D) 2000 UNITS CAPS Take 1 capsule by mouth every morning.        . citalopram (CELEXA) 20 MG tablet Take 20 mg by mouth daily.      . clonazePAM (KLONOPIN) 0.5 MG tablet Take 0.5 mg by mouth 3 (three) times daily as needed for anxiety (agitatuion).      . dabigatran (PRADAXA) 75 MG CAPS Take 75 mg by mouth every 12 (twelve) hours.      Marland Kitchen diltiazem (CARDIZEM CD) 240 MG 24 hr capsule Take 240 mg by mouth daily.        Marland Kitchen docusate sodium (COLACE) 100 MG capsule Take 200 mg by mouth 2 (two) times daily as needed for constipation.      . ferrous sulfate 325 (65 FE) MG tablet Take 325 mg by mouth daily with breakfast.      . furosemide (LASIX) 40 MG tablet Take 1 tablet (40 mg total) by mouth 2 (two) times daily.  60 tablet  12  . LANTUS SOLOSTAR 100 UNIT/ML injection Inject 12 Units into the skin daily.       Marland Kitchen loratadine (CLARITIN) 10 MG tablet Take 10 mg by mouth daily.      . metoprolol (LOPRESSOR) 50 MG tablet Take 50 mg by mouth 2 (two) times daily.      . mirtazapine (REMERON) 15 MG tablet Take 7.5 mg by mouth at bedtime.      Marland Kitchen  NOVOFINE AUTOCOVER 30G X 8 MM MISC       . pantoprazole (PROTONIX) 40 MG tablet Take 1 tablet (40 mg total) by mouth daily.  30 tablet  12  . polyethylene glycol (MIRALAX / GLYCOLAX) packet Take 17 g by mouth daily as needed (constipation).      . potassium chloride SA (K-DUR,KLOR-CON) 20 MEQ tablet Take 20 mEq by mouth 2 (two) times daily.      Marland Kitchen triamcinolone cream (KENALOG) 0.1 % Apply 1 application topically daily. Apply to lower extremities once daily       No current facility-administered medications for this visit.    Past Medical History  Diagnosis Date  . CHF (congestive heart failure)   . Diabetes mellitus   . Hypertension   . COPD (chronic obstructive pulmonary disease)   . Vertigo   . Cancer     breast with mastectomy  . Stroke       ?  . Skin disorder      Epidermolysis bullosa.  .  Pulmonary hypertension   . Atrial fibrillation 2011  . Multiple thyroid nodules   . Anemia   . Breast cancer   . Anemia of other chronic disease 06/09/2012    Past Surgical History  Procedure Laterality Date  . Cholecystectomy    . Hemorrhoid surgery    . Abdominal hysterectomy  1991    complete hysterectomy for cancer  . Mastectomy  2005    left breast, tamoxifin  . Goiter    . Hip fracture surgery  2010    right hip replacement  . Hip fracture surgery  2006    left hip replacement  . Colonoscopy  ?    remote  . Cataract extraction, bilateral      RUE:AVWUJW of systems complete and found to be negative unless listed above  PHYSICAL EXAM BP 119/62  Pulse 84  Ht 5\' 3"  (1.6 m)  Wt 139 lb (63.05 kg)  BMI 24.63 kg/m2  General: Well developed, well nourished, in no acute distress, sitting in a wheelchair wearing oxygen via nasal cannula Head: Eyes PERRLA, No xanthomas.   Normal cephalic and atramatic  Lungs: Clear bilaterally to auscultation Heart: HRRR S1 S2, without soft systolic murmur,  pulses are 2+ & equal.            No carotid bruit. No JVD.  Abdomen: Bowel sounds are positive, abdomen soft and non-tender without masses or                  Hernia's noted. Msk:  Back normal, . Normal strength and tone for age. Extremities: No clubbing, cyanosis, nonpitting lower extremity dependent edema.  DP +1 Neuro: Alert and oriented X 3. Psych:  Good affect, responds appropriately    ASSESSMENT AND PLAN

## 2012-11-26 NOTE — Telephone Encounter (Signed)
Message copied by Ovidio Kin on Thu Nov 26, 2012 10:04 AM ------      Message from: Kathlen Brunswick      Created: Wed Nov 25, 2012  2:53 PM      Regarding: Lab        Please have patient come in for a Bmet. ------

## 2012-11-26 NOTE — Assessment & Plan Note (Signed)
He is maintaining her weight very well. There has been very little change in her weight over the last month, based upon her calendar it deviates between a pound and a half a pound daily. I am pleased with her current fluid status, she is without evidence of edema or overload. She appears well compensated. She does have some dependent edema noted with woody skin changes, there are chronic for her. We will continue all of her medications as directed. We will see her again in 3 months unless she becomes symptomatic. I do have a concern if she does return home, as her diet will be maintained by family members. I reinforced the need to have a low-salt diet.

## 2012-11-26 NOTE — Progress Notes (Deleted)
Name: Catherine [name]    DOB: 1923-10-26  Age: 79 y.o.  MR#: [id number]       PCP:  Carylon Perches, MD      Insurance: Payor: [employer] [place] [employer] / Plan: BLUE MEDICARE / Product Type: *No Product type* /   CC:   No chief complaint on file.   VS Filed Vitals:   11/26/12 1443  BP: 119/62  Pulse: 84  Height: 5\' 3"  (1.6 m)  Weight: 139 lb (63.05 kg)    Weights Current Weight  11/26/12 139 lb (63.05 kg)  10/22/12 138 lb (62.596 kg)  10/09/12 137 lb 12.8 oz (62.506 kg)    Blood Pressure  BP Readings from Last 3 Encounters:  11/26/12 119/62  10/22/12 118/64  10/09/12 104/67     Admit date:  (Not on file) Last encounter with RMR:  10/22/2012   Allergy Amoxicillin-pot clavulanate  Current Outpatient Prescriptions  Medication Sig Dispense Refill  . acetaminophen (TYLENOL) 325 MG tablet Take 650 mg by mouth every 6 (six) hours as needed for pain.      Marland Kitchen albuterol (PROAIR HFA) 108 (90 BASE) MCG/ACT inhaler Inhale 2 puffs into the lungs every 4 (four) hours as needed for wheezing.      Marland Kitchen atorvastatin (LIPITOR) 10 MG tablet Take 10 mg by mouth at bedtime.      . bisacodyl (DULCOLAX) 5 MG EC tablet Take 10 mg by mouth daily as needed for constipation.      . Cholecalciferol (VITAMIN D) 2000 UNITS CAPS Take 1 capsule by mouth every morning.        . citalopram (CELEXA) 20 MG tablet Take 20 mg by mouth daily.      . clonazePAM (KLONOPIN) 0.5 MG tablet Take 0.5 mg by mouth 3 (three) times daily as needed for anxiety (agitatuion).      . dabigatran (PRADAXA) 75 MG CAPS Take 75 mg by mouth every 12 (twelve) hours.      Marland Kitchen diltiazem (CARDIZEM CD) 240 MG 24 hr capsule Take 240 mg by mouth daily.        Marland Kitchen docusate sodium (COLACE) 100 MG capsule Take 200 mg by mouth 2 (two) times daily as needed for constipation.      . ferrous sulfate 325 (65 FE) MG tablet Take 325 mg by mouth daily with breakfast.      . furosemide (LASIX) 40 MG tablet Take 1 tablet (40 mg total) by mouth 2 (two) times  daily.  60 tablet  12  . LANTUS SOLOSTAR 100 UNIT/ML injection Inject 12 Units into the skin daily.       Marland Kitchen loratadine (CLARITIN) 10 MG tablet Take 10 mg by mouth daily.      . metoprolol (LOPRESSOR) 50 MG tablet Take 50 mg by mouth 2 (two) times daily.      . mirtazapine (REMERON) 15 MG tablet Take 7.5 mg by mouth at bedtime.      Marland Kitchen NOVOFINE AUTOCOVER 30G X 8 MM MISC       . pantoprazole (PROTONIX) 40 MG tablet Take 1 tablet (40 mg total) by mouth daily.  30 tablet  12  . polyethylene glycol (MIRALAX / GLYCOLAX) packet Take 17 g by mouth daily as needed (constipation).      . potassium chloride SA (K-DUR,KLOR-CON) 20 MEQ tablet Take 20 mEq by mouth 2 (two) times daily.      Marland Kitchen triamcinolone cream (KENALOG) 0.1 % Apply 1 application topically daily. Apply to lower  extremities once daily       No current facility-administered medications for this visit.    Discontinued Meds:   There are no discontinued medications.  Patient Active Problem List   Diagnosis Date Noted  . Chronic cor pulmonale 10/08/2012  . Chronic kidney disease 10/07/2012  . Hypertension   . Atrial fibrillation   . CHF (congestive heart failure) 10/06/2012  . Community acquired pneumonia 10/06/2012  . Anemia of other chronic disease 06/09/2012    LABS    Component Value Date/Time   NA 138 10/22/2012 1540   NA 142 10/09/2012 0445   NA 142 10/08/2012 0510   K 4.6 10/22/2012 1540   K 4.6 10/09/2012 0445   K 4.3 10/08/2012 0510   CL 102 10/22/2012 1540   CL 105 10/09/2012 0445   CL 106 10/08/2012 0510   CO2 28 10/22/2012 1540   CO2 28 10/09/2012 0445   CO2 28 10/08/2012 0510   GLUCOSE 120* 10/22/2012 1540   GLUCOSE 98 10/09/2012 0445   GLUCOSE 91 10/08/2012 0510   BUN 39* 10/22/2012 1540   BUN 38* 10/09/2012 0445   BUN 37* 10/08/2012 0510   CREATININE 1.71* 10/22/2012 1540   CREATININE 1.67* 10/09/2012 0445   CREATININE 1.71* 10/08/2012 0510   CREATININE 1.86* 10/07/2012 0457   CALCIUM 9.5 10/22/2012 1540   CALCIUM 8.4  10/09/2012 0445   CALCIUM 8.6 10/08/2012 0510   GFRNONAA 27* 10/09/2012 0445   GFRNONAA 26* 10/08/2012 0510   GFRNONAA 23* 10/07/2012 0457   GFRAA 31* 10/09/2012 0445   GFRAA 30* 10/08/2012 0510   GFRAA 27* 10/07/2012 0457   CMP     Component Value Date/Time   NA 138 10/22/2012 1540   K 4.6 10/22/2012 1540   CL 102 10/22/2012 1540   CO2 28 10/22/2012 1540   GLUCOSE 120* 10/22/2012 1540   BUN 39* 10/22/2012 1540   CREATININE 1.71* 10/22/2012 1540   CREATININE 1.67* 10/09/2012 0445   CALCIUM 9.5 10/22/2012 1540   PROT 7.0 10/06/2012 0545   ALBUMIN 3.6 10/06/2012 0545   AST 17 10/06/2012 0545   ALT 8 10/06/2012 0545   ALKPHOS 111 10/06/2012 0545   BILITOT 0.5 10/06/2012 0545   GFRNONAA 27* 10/09/2012 0445   GFRAA 31* 10/09/2012 0445       Component Value Date/Time   WBC 5.4 11/04/2012 1309   WBC 3.9* 10/14/2012 1402   WBC 4.4 10/06/2012 0545   HGB 11.9* 11/04/2012 1309   HGB 11.6* 10/14/2012 1402   HGB 11.3* 10/06/2012 0545   HCT 37.0 11/04/2012 1309   HCT 36.1 10/14/2012 1402   HCT 35.2* 10/06/2012 0545   MCV 101.6* 11/04/2012 1309   MCV 100.8* 10/14/2012 1402   MCV 99.4 10/06/2012 0545    Lipid Panel     Component Value Date/Time   CHOL 77 10/07/2012 0457   TRIG 43 10/07/2012 0457   HDL 52 10/07/2012 0457   CHOLHDL 1.5 10/07/2012 0457   VLDL 9 10/07/2012 0457   LDLCALC 16 10/07/2012 0457    ABG    Component Value Date/Time   PHART 7.404* 01/26/2010 1637   PCO2ART 46.1* 01/26/2010 1637   PO2ART 52.0* 01/26/2010 1637   HCO3 28.2* 01/26/2010 1637   TCO2 26.3 01/26/2010 1637   O2SAT 85.1 01/26/2010 1637     Lab Results  Component Value Date   TSH 1.041 12/16/2009   BNP (last 3 results)  Recent Labs  10/06/12 0050 10/09/12 0445 10/22/12 1540  PROBNP 4853.0* 4038.0* 6911.00*  Cardiac Panel (last 3 results) No results found for this basename: CKTOTAL, CKMB, TROPONINI, RELINDX,  in the last 72 hours  Iron/TIBC/Ferritin No results found for this basename: iron, tibc, ferritin     EKG Orders  placed in visit on 10/22/12  . EKG 12-LEAD     Prior Assessment and Plan Problem List as of 11/26/2012     Cardiovascular and Mediastinum   CHF (congestive heart failure)   Last Assessment & Plan   10/22/2012 Office Visit Edited 10/22/2012  3:19 PM by Jodelle Gross, NP     There is some evidence of fluid overload noted in the lower extremities with 1+ to 2+ pitting edema in the dependent position with woody skin changes. I will increase her Lasix to 60 mg in the morning in 40 mg in the PM for one day. I have given written instructions to [facility] to weigh her daily. They report a weight gain of anything sustained over 135 pounds. The patient have a BMET and a pro BNP drawn today. Will see her again in one month. She is on calcium channel blockers for blood pressure and heart rate control which can also cause some dependent edema. I have asked her to keep her legs elevated when she is sitting for long periods of time. It also provided in written instructions    Hypertension   Atrial fibrillation   Last Assessment & Plan   10/22/2012 Office Visit Edited 10/22/2012  3:18 PM by Jodelle Gross, NP     Heart rate is currently well-controlled. Will continue her on her current medications as she appears to be tolerating them with good response. She will continue on anticoagulation with Pradaxa twice a day    Chronic cor pulmonale     Respiratory   Community acquired pneumonia     Genitourinary   Chronic kidney disease   Last Assessment & Plan   10/22/2012 Office Visit Written 10/22/2012  3:18 PM by Jodelle Gross, NP     BMET and proBNP are being ordered. We will make further recommendations depending upon lab results.      Other   Anemia of other chronic disease       Imaging: No results found.

## 2012-11-26 NOTE — Assessment & Plan Note (Signed)
Excellent control blood pressure. No changes in her medications this time. [place] followup labs on next visit.

## 2012-11-26 NOTE — Patient Instructions (Signed)
Your physician recommends that you schedule a follow-up appointment in: 3 MONTHS  Your physician recommends that you continue on your current medications as directed. Please refer to the Current Medication list given to you today.   

## 2012-11-26 NOTE — Telephone Encounter (Signed)
Left a message and mailed labs to be drawn with instruction letter to pt address noted in chart

## 2012-11-26 NOTE — Assessment & Plan Note (Signed)
Patient's heart rate is well-controlled. She is without complaints of palpitations or heart racing. She continues to be a resident of [facility] and is medically compliant with medications that are provided for her. I will not make any changes at this time. She remains on Primaxin twice a day without evidence of bleeding or stomach irritation. We will see her again in 3 months unless she becomes symptomatic.

## 2012-11-30 LAB — BASIC METABOLIC PANEL
CO2: 27 mEq/L (ref 19–32)
Calcium: 9.1 mg/dL (ref 8.4–10.5)
Chloride: 103 mEq/L (ref 96–112)
Creat: 1.55 mg/dL — ABNORMAL HIGH (ref 0.50–1.10)
Glucose, Bld: 77 mg/dL (ref 70–99)

## 2012-12-03 ENCOUNTER — Encounter (HOSPITAL_COMMUNITY): Payer: Medicare Other | Attending: Oncology

## 2012-12-03 ENCOUNTER — Encounter (HOSPITAL_BASED_OUTPATIENT_CLINIC_OR_DEPARTMENT_OTHER): Payer: Medicare Other

## 2012-12-03 DIAGNOSIS — D649 Anemia, unspecified: Secondary | ICD-10-CM

## 2012-12-03 DIAGNOSIS — D638 Anemia in other chronic diseases classified elsewhere: Secondary | ICD-10-CM | POA: Insufficient documentation

## 2012-12-03 LAB — CBC
MCH: 32.9 pg (ref 26.0–34.0)
MCHC: 32.1 g/dL (ref 30.0–36.0)
RDW: 15.4 % (ref 11.5–15.5)

## 2012-12-03 NOTE — Progress Notes (Signed)
Catherine [name] presented for labwork. Labs per MD order drawn via Peripheral Line 25 gauge needle inserted in rt ac.  Good blood return present. Procedure without incident.  Needle removed intact. Patient tolerated procedure well. No aranesp needed.

## 2012-12-05 ENCOUNTER — Encounter (HOSPITAL_COMMUNITY): Payer: Self-pay | Admitting: *Deleted

## 2012-12-05 ENCOUNTER — Other Ambulatory Visit: Payer: Self-pay

## 2012-12-05 ENCOUNTER — Emergency Department (HOSPITAL_COMMUNITY): Payer: Medicare Other

## 2012-12-05 ENCOUNTER — Observation Stay (HOSPITAL_COMMUNITY): Payer: Medicare Other

## 2012-12-05 ENCOUNTER — Inpatient Hospital Stay (HOSPITAL_COMMUNITY)
Admission: EM | Admit: 2012-12-05 | Discharge: 2012-12-11 | DRG: 392 | Disposition: A | Discharge status: Home Health | Payer: Medicare Other | Attending: Internal Medicine | Admitting: Internal Medicine

## 2012-12-05 DIAGNOSIS — I2789 Other specified pulmonary heart diseases: Secondary | ICD-10-CM | POA: Diagnosis present

## 2012-12-05 DIAGNOSIS — D638 Anemia in other chronic diseases classified elsewhere: Secondary | ICD-10-CM

## 2012-12-05 DIAGNOSIS — D696 Thrombocytopenia, unspecified: Secondary | ICD-10-CM | POA: Diagnosis present

## 2012-12-05 DIAGNOSIS — Q828 Other specified congenital malformations of skin: Secondary | ICD-10-CM

## 2012-12-05 DIAGNOSIS — J449 Chronic obstructive pulmonary disease, unspecified: Secondary | ICD-10-CM | POA: Diagnosis present

## 2012-12-05 DIAGNOSIS — Z9981 Dependence on supplemental oxygen: Secondary | ICD-10-CM

## 2012-12-05 DIAGNOSIS — R339 Retention of urine, unspecified: Secondary | ICD-10-CM | POA: Diagnosis present

## 2012-12-05 DIAGNOSIS — I4891 Unspecified atrial fibrillation: Secondary | ICD-10-CM | POA: Diagnosis present

## 2012-12-05 DIAGNOSIS — Z9010 Acquired absence of unspecified breast and nipple: Secondary | ICD-10-CM

## 2012-12-05 DIAGNOSIS — I359 Nonrheumatic aortic valve disorder, unspecified: Secondary | ICD-10-CM | POA: Diagnosis present

## 2012-12-05 DIAGNOSIS — J9610 Chronic respiratory failure, unspecified whether with hypoxia or hypercapnia: Secondary | ICD-10-CM

## 2012-12-05 DIAGNOSIS — J90 Pleural effusion, not elsewhere classified: Secondary | ICD-10-CM

## 2012-12-05 DIAGNOSIS — N183 Chronic kidney disease, stage 3 unspecified: Secondary | ICD-10-CM | POA: Diagnosis present

## 2012-12-05 DIAGNOSIS — J4489 Other specified chronic obstructive pulmonary disease: Secondary | ICD-10-CM | POA: Diagnosis present

## 2012-12-05 DIAGNOSIS — E119 Type 2 diabetes mellitus without complications: Secondary | ICD-10-CM | POA: Diagnosis present

## 2012-12-05 DIAGNOSIS — N179 Acute kidney failure, unspecified: Secondary | ICD-10-CM | POA: Diagnosis present

## 2012-12-05 DIAGNOSIS — K5900 Constipation, unspecified: Secondary | ICD-10-CM | POA: Diagnosis not present

## 2012-12-05 DIAGNOSIS — Z66 Do not resuscitate: Secondary | ICD-10-CM | POA: Diagnosis present

## 2012-12-05 DIAGNOSIS — D649 Anemia, unspecified: Secondary | ICD-10-CM

## 2012-12-05 DIAGNOSIS — Z853 Personal history of malignant neoplasm of breast: Secondary | ICD-10-CM

## 2012-12-05 DIAGNOSIS — Z79899 Other long term (current) drug therapy: Secondary | ICD-10-CM

## 2012-12-05 DIAGNOSIS — I5032 Chronic diastolic (congestive) heart failure: Secondary | ICD-10-CM | POA: Diagnosis present

## 2012-12-05 DIAGNOSIS — Z96649 Presence of unspecified artificial hip joint: Secondary | ICD-10-CM

## 2012-12-05 DIAGNOSIS — I129 Hypertensive chronic kidney disease with stage 1 through stage 4 chronic kidney disease, or unspecified chronic kidney disease: Secondary | ICD-10-CM | POA: Diagnosis present

## 2012-12-05 DIAGNOSIS — I509 Heart failure, unspecified: Secondary | ICD-10-CM | POA: Diagnosis present

## 2012-12-05 DIAGNOSIS — R509 Fever, unspecified: Secondary | ICD-10-CM

## 2012-12-05 DIAGNOSIS — D472 Monoclonal gammopathy: Secondary | ICD-10-CM | POA: Diagnosis present

## 2012-12-05 DIAGNOSIS — R112 Nausea with vomiting, unspecified: Secondary | ICD-10-CM

## 2012-12-05 DIAGNOSIS — K5289 Other specified noninfective gastroenteritis and colitis: Principal | ICD-10-CM | POA: Diagnosis present

## 2012-12-05 HISTORY — DX: Nonrheumatic aortic (valve) stenosis: I35.0

## 2012-12-05 HISTORY — DX: Monoclonal gammopathy: D47.2

## 2012-12-05 LAB — COMPREHENSIVE METABOLIC PANEL
ALT: 9 U/L (ref 0–35)
AST: 18 U/L (ref 0–37)
Albumin: 3.4 g/dL — ABNORMAL LOW (ref 3.5–5.2)
Alkaline Phosphatase: 90 U/L (ref 39–117)
Calcium: 8.6 mg/dL (ref 8.4–10.5)
GFR calc Af Amer: 40 mL/min — ABNORMAL LOW (ref >90)
Glucose, Bld: 182 mg/dL — ABNORMAL HIGH (ref 70–99)
Potassium: 3.8 mEq/L (ref 3.5–5.1)
Sodium: 140 mEq/L (ref 135–145)
Total Protein: 6.6 g/dL (ref 6.0–8.3)

## 2012-12-05 LAB — GLUCOSE, CAPILLARY: Glucose-Capillary: 110 mg/dL — ABNORMAL HIGH (ref 70–99)

## 2012-12-05 LAB — URINALYSIS, ROUTINE W REFLEX MICROSCOPIC
Bilirubin Urine: NEGATIVE
Ketones, ur: NEGATIVE mg/dL
Nitrite: NEGATIVE
Protein, ur: NEGATIVE mg/dL
Urobilinogen, UA: 0.2 mg/dL (ref 0.0–1.0)

## 2012-12-05 LAB — CBC WITH DIFFERENTIAL/PLATELET
Basophils Absolute: 0 10*3/uL (ref 0.0–0.1)
Eosinophils Absolute: 0 10*3/uL (ref 0.0–0.7)
Lymphs Abs: 0.5 10*3/uL — ABNORMAL LOW (ref 0.7–4.0)
MCH: 33.1 pg (ref 26.0–34.0)
Neutrophils Relative %: 89 % — ABNORMAL HIGH (ref 43–77)
Platelets: 119 10*3/uL — ABNORMAL LOW (ref 150–400)
RBC: 3.08 MIL/uL — ABNORMAL LOW (ref 3.87–5.11)
RDW: 15.5 % (ref 11.5–15.5)
WBC: 9 10*3/uL (ref 4.0–10.5)

## 2012-12-05 LAB — URINE MICROSCOPIC-ADD ON

## 2012-12-05 LAB — OCCULT BLOOD, POC DEVICE: Fecal Occult Bld: NEGATIVE

## 2012-12-05 MED ORDER — ACETAMINOPHEN 325 MG PO TABS
650.0000 mg | ORAL_TABLET | Freq: Four times a day (QID) | ORAL | Status: DC | PRN
  Administered 2012-12-06: 650 mg via ORAL
  Filled 2012-12-05: qty 2

## 2012-12-05 MED ORDER — ACETAMINOPHEN 650 MG RE SUPP: 650.0000 mg | Freq: Four times a day (QID) | RECTAL | Status: DC | PRN

## 2012-12-05 MED ORDER — ALBUTEROL SULFATE HFA 108 (90 BASE) MCG/ACT IN AERS
2.0000 | INHALATION_SPRAY | RESPIRATORY_TRACT | Status: DC | PRN
  Administered 2012-12-10: 2 via RESPIRATORY_TRACT
  Filled 2012-12-05: qty 6.7

## 2012-12-05 MED ORDER — SODIUM CHLORIDE 0.9 % IV BOLUS (SEPSIS)
700.0000 mL | Freq: Once | INTRAVENOUS | Status: AC
  Administered 2012-12-05: 700 mL via INTRAVENOUS

## 2012-12-05 MED ORDER — ONDANSETRON HCL 4 MG/2ML IJ SOLN
4.0000 mg | Freq: Four times a day (QID) | INTRAMUSCULAR | Status: DC | PRN
  Administered 2012-12-06: 4 mg via INTRAVENOUS
  Filled 2012-12-05: qty 2

## 2012-12-05 MED ORDER — SODIUM CHLORIDE 0.9 % IV SOLN
Freq: Once | INTRAVENOUS | Status: AC
  Administered 2012-12-05: 09:00:00 via INTRAVENOUS

## 2012-12-05 MED ORDER — ATORVASTATIN CALCIUM 10 MG PO TABS
10.0000 mg | ORAL_TABLET | Freq: Every day | ORAL | Status: DC
  Administered 2012-12-05 – 2012-12-10 (×6): 10 mg via ORAL
  Filled 2012-12-05 (×6): qty 1

## 2012-12-05 MED ORDER — PANTOPRAZOLE SODIUM 40 MG PO TBEC
40.0000 mg | DELAYED_RELEASE_TABLET | Freq: Every day | ORAL | Status: DC
  Administered 2012-12-05 – 2012-12-11 (×7): 40 mg via ORAL
  Filled 2012-12-05 (×7): qty 1

## 2012-12-05 MED ORDER — METOPROLOL TARTRATE 50 MG PO TABS
50.0000 mg | ORAL_TABLET | Freq: Two times a day (BID) | ORAL | Status: DC
  Administered 2012-12-05 – 2012-12-11 (×12): 50 mg via ORAL
  Filled 2012-12-05 (×12): qty 1

## 2012-12-05 MED ORDER — ACETAMINOPHEN 325 MG PO TABS
650.0000 mg | ORAL_TABLET | Freq: Once | ORAL | Status: AC
  Administered 2012-12-05: 650 mg via ORAL
  Filled 2012-12-05: qty 2

## 2012-12-05 MED ORDER — INSULIN GLARGINE 100 UNIT/ML ~~LOC~~ SOLN
6.0000 [IU] | Freq: Every day | SUBCUTANEOUS | Status: DC
  Administered 2012-12-06 – 2012-12-11 (×6): 6 [IU] via SUBCUTANEOUS
  Filled 2012-12-05 (×9): qty 0.06

## 2012-12-05 MED ORDER — INSULIN ASPART 100 UNIT/ML ~~LOC~~ SOLN
0.0000 [IU] | Freq: Three times a day (TID) | SUBCUTANEOUS | Status: DC
  Administered 2012-12-06: 1 [IU] via SUBCUTANEOUS
  Administered 2012-12-06 (×2): 2 [IU] via SUBCUTANEOUS
  Administered 2012-12-07 – 2012-12-09 (×5): 1 [IU] via SUBCUTANEOUS
  Administered 2012-12-10: 3 [IU] via SUBCUTANEOUS
  Administered 2012-12-10: 2 [IU] via SUBCUTANEOUS
  Administered 2012-12-11: 1 [IU] via SUBCUTANEOUS

## 2012-12-05 MED ORDER — DABIGATRAN ETEXILATE MESYLATE 75 MG PO CAPS
75.0000 mg | ORAL_CAPSULE | Freq: Two times a day (BID) | ORAL | Status: DC
  Administered 2012-12-06 – 2012-12-11 (×11): 75 mg via ORAL
  Filled 2012-12-05 (×18): qty 1

## 2012-12-05 MED ORDER — POTASSIUM CHLORIDE CRYS ER 20 MEQ PO TBCR
20.0000 meq | EXTENDED_RELEASE_TABLET | Freq: Two times a day (BID) | ORAL | Status: DC
  Administered 2012-12-05 – 2012-12-08 (×7): 20 meq via ORAL
  Filled 2012-12-05 (×8): qty 1

## 2012-12-05 MED ORDER — SODIUM CHLORIDE 0.9 % IJ SOLN
3.0000 mL | Freq: Two times a day (BID) | INTRAMUSCULAR | Status: DC
  Administered 2012-12-05 – 2012-12-10 (×7): 3 mL via INTRAVENOUS

## 2012-12-05 MED ORDER — LORATADINE 10 MG PO TABS
10.0000 mg | ORAL_TABLET | Freq: Every day | ORAL | Status: DC
  Administered 2012-12-05 – 2012-12-11 (×7): 10 mg via ORAL
  Filled 2012-12-05 (×7): qty 1

## 2012-12-05 MED ORDER — CLONAZEPAM 0.5 MG PO TABS
0.5000 mg | ORAL_TABLET | Freq: Three times a day (TID) | ORAL | Status: DC | PRN
Start: 2012-12-05 — End: 2012-12-11
  Administered 2012-12-08 – 2012-12-10 (×3): 0.5 mg via ORAL
  Filled 2012-12-05 (×3): qty 1

## 2012-12-05 MED ORDER — MIRTAZAPINE 15 MG PO TABS
7.5000 mg | ORAL_TABLET | Freq: Every day | ORAL | Status: DC
  Administered 2012-12-06 – 2012-12-10 (×5): 7.5 mg via ORAL
  Filled 2012-12-05 (×3): qty 0.5
  Filled 2012-12-05: qty 1
  Filled 2012-12-05: qty 0.5
  Filled 2012-12-05: qty 1
  Filled 2012-12-05 (×3): qty 0.5

## 2012-12-05 MED ORDER — DILTIAZEM HCL ER COATED BEADS 240 MG PO CP24
240.0000 mg | ORAL_CAPSULE | Freq: Every day | ORAL | Status: DC
  Administered 2012-12-05 – 2012-12-11 (×7): 240 mg via ORAL
  Filled 2012-12-05 (×7): qty 1

## 2012-12-05 MED ORDER — CITALOPRAM HYDROBROMIDE 20 MG PO TABS
20.0000 mg | ORAL_TABLET | Freq: Every day | ORAL | Status: DC
  Administered 2012-12-05 – 2012-12-11 (×7): 20 mg via ORAL
  Filled 2012-12-05 (×7): qty 1

## 2012-12-05 MED ORDER — ONDANSETRON HCL 4 MG PO TABS: 4.0000 mg | ORAL_TABLET | Freq: Four times a day (QID) | ORAL | Status: DC | PRN

## 2012-12-05 MED ORDER — ONDANSETRON HCL 4 MG/2ML IJ SOLN
4.0000 mg | Freq: Once | INTRAMUSCULAR | Status: AC
  Administered 2012-12-05: 4 mg via INTRAVENOUS
  Filled 2012-12-05: qty 2

## 2012-12-05 NOTE — ED Notes (Signed)
Pt resident at [facility], fever 102 tympanic per EMS, N/V since 3AM. EMS started IV, gave 4mg  zofran en route at 0745.

## 2012-12-05 NOTE — ED Notes (Signed)
Spoke with Dondra Spry at [facility], will update on patient status when we know more.

## 2012-12-05 NOTE — ED Provider Notes (Signed)
CSN: [id number]     Arrival date & time 12/05/12  0750 History  This chart was scribed for Ward Givens, MD,  by Ashley Jacobs, ED Scribe. The patient was seen in room APA08/APA08 and the patient's care was started at 8:20 AM.   Chief Complaint  Patient presents with  . Nausea  . Emesis  . Fever    102 tympanic per EMS   (Consider location/radiation/quality/duration/timing/severity/associated sxs/prior Treatment) Patient is a 79 y.o. female presenting with vomiting and fever. The history is provided by the patient and medical records. No language interpreter was used.  Emesis Severity:  Mild Timing:  Constant Progression:  Worsening Chronicity:  New Relieved by:  Nothing Associated symptoms: chills and fever   Associated symptoms: no abdominal pain and no diarrhea   Risk factors: diabetes   Risk factors: no sick contacts   Fever Associated symptoms: chills, nausea and vomiting   Associated symptoms: no diarrhea    HPI Comments: Catherine [name] is a 79 y.o. female who presents to the Emergency Department complaining of nausea, emesis and fever that with sudden onset the the morning PTA. Pt reports that she is vomiting "hot water" and experiencing chills and mouth dryness. She mentions that when she vomited it felt "hot" and denies eating dinner the night PTA. After having Zofran she mentions that nausea and vomiting have somewhat resolved. Pt reports that she wears oxygen throughout the day.  Pt denies any pain and abnormal SOB. Pt denies sick contact. Pt denies h/o smoking and drinking. She is a resident at [facility].   Patient states she was seen by her PCP yesterday and was fine. During the night she woke up and felt cold and thought her blood sugar was low so she M. and M.'s and felt better. She states later she woke up again and was again feeling cold. At this point she called for help at her assisted living facility. She states she has had nausea and has been  vomiting up "hot water". She denies that it's burning. She denies any pain including abdominal pain. She denies diarrhea. She states she has chronic shortness of breath which is not worse. She denies any coughing. She denies being around anybody else that she knows is sick. She states last night she ate a fish patty muscle wall and peaches. She reports EMS gave her nausea medicine and she starting to feel better.  PCP Dr Ouida Sills    Past Medical History  Diagnosis Date  . CHF (congestive heart failure)   . Diabetes mellitus   . Hypertension   . COPD (chronic obstructive pulmonary disease)   . Vertigo   . Cancer     breast with mastectomy  . Stroke       ?  . Skin disorder      Epidermolysis bullosa.  . Pulmonary hypertension   . Atrial fibrillation 2011  . Multiple thyroid nodules   . Anemia   . Breast cancer   . Anemia of other chronic disease 06/09/2012  . Aortic stenosis   . Monoclonal gammopathy   . Renal disorder     CKD  . Hypothermia    Past Surgical History  Procedure Laterality Date  . Cholecystectomy    . Hemorrhoid surgery    . Abdominal hysterectomy  1991    complete hysterectomy for cancer  . Mastectomy  2005    left breast, tamoxifin  . Goiter    . Hip fracture surgery  2010    right hip replacement  . Hip fracture surgery  2006    left hip replacement  . Colonoscopy  ?    remote  . Cataract extraction, bilateral     Family History  Problem Relation Age of Onset  . Colon cancer Neg Hx   . Heart attack Son     age 6s, suspected MI  . Diabetes Mother    History  Substance Use Topics  . Smoking status: Never Smoker   . Smokeless tobacco: Never Used  . Alcohol Use: No  lives in ALF Home oxygen 2 lpm [place] 24/7  OB History   Grav Para Term Preterm Abortions TAB SAB Ect Mult Living                 Review of Systems  Constitutional: Positive for fever, chills and appetite change.  Respiratory: Negative for shortness of breath.   Gastrointestinal:  Positive for nausea and vomiting. Negative for abdominal pain and diarrhea.  Musculoskeletal: Negative for back pain.  All other systems reviewed and are negative.    Allergies  Amoxicillin-pot clavulanate  Home Medications   Current Outpatient Rx  Name  Route  Sig  Dispense  Refill  . acetaminophen (TYLENOL) 325 MG tablet   Oral   Take 650 mg by mouth every 6 (six) hours as needed for pain.         Marland Kitchen albuterol (PROAIR HFA) 108 (90 BASE) MCG/ACT inhaler   Inhalation   Inhale 2 puffs into the lungs every 4 (four) hours as needed for wheezing.         Marland Kitchen atorvastatin (LIPITOR) 10 MG tablet   Oral   Take 10 mg by mouth at bedtime.         . bisacodyl (DULCOLAX) 5 MG EC tablet   Oral   Take 10 mg by mouth daily as needed for constipation.         . Cholecalciferol (VITAMIN D) 2000 UNITS CAPS   Oral   Take 1 capsule by mouth every morning.           . citalopram (CELEXA) 20 MG tablet   Oral   Take 20 mg by mouth daily.         . clonazePAM (KLONOPIN) 0.5 MG tablet   Oral   Take 0.5 mg by mouth 3 (three) times daily as needed for anxiety (agitatuion).         . dabigatran (PRADAXA) 75 MG CAPS   Oral   Take 75 mg by mouth every 12 (twelve) hours.         Marland Kitchen diltiazem (CARDIZEM CD) 240 MG 24 hr capsule   Oral   Take 240 mg by mouth daily.           Marland Kitchen docusate sodium (COLACE) 100 MG capsule   Oral   Take 200 mg by mouth 2 (two) times daily as needed for constipation.         . ferrous sulfate 325 (65 FE) MG tablet   Oral   Take 325 mg by mouth daily with breakfast.         . furosemide (LASIX) 40 MG tablet   Oral   Take 1 tablet (40 mg total) by mouth 2 (two) times daily.   60 tablet   12   . LANTUS SOLOSTAR 100 UNIT/ML injection   Subcutaneous   Inject 12 Units into the skin daily.          Marland Kitchen  loratadine (CLARITIN) 10 MG tablet   Oral   Take 10 mg by mouth daily.         . metoprolol (LOPRESSOR) 50 MG tablet   Oral   Take 50 mg  by mouth 2 (two) times daily.         . mirtazapine (REMERON) 15 MG tablet   Oral   Take 7.5 mg by mouth at bedtime.         Marland Kitchen NOVOFINE AUTOCOVER 30G X 8 MM MISC               . pantoprazole (PROTONIX) 40 MG tablet   Oral   Take 1 tablet (40 mg total) by mouth daily.   30 tablet   12   . polyethylene glycol (MIRALAX / GLYCOLAX) packet   Oral   Take 17 g by mouth daily as needed (constipation).         . potassium chloride SA (K-DUR,KLOR-CON) 20 MEQ tablet   Oral   Take 20 mEq by mouth 2 (two) times daily.         Marland Kitchen triamcinolone cream (KENALOG) 0.1 %   Topical   Apply 1 application topically daily. Apply to lower extremities once daily          BP 112/61  Pulse 111  Temp(Src) 99.9 F (37.7 C) (Oral)  Resp 24  Ht 5\' 4"  (1.626 m)  Wt 140 lb (63.504 kg)  BMI 24.02 kg/m2  SpO2 89%  Vital signs normal except tachycardia  Physical Exam  Nursing note and vitals reviewed. Constitutional: She is oriented to person, place, and time. She appears well-developed and well-nourished.  Non-toxic appearance. She does not appear ill. No distress.  HENT:  Head: Normocephalic and atraumatic.  Right Ear: External ear normal.  Left Ear: External ear normal.  Nose: Nose normal. No mucosal edema or rhinorrhea.  Mouth/Throat: Mucous membranes are normal. No dental abscesses or edematous.  Dry mucus membrane   Eyes: Conjunctivae and EOM are normal. Pupils are equal, round, and reactive to light.  Neck: Normal range of motion and full passive range of motion without pain. Neck supple.  Cardiovascular: Normal rate and regular rhythm.  Exam reveals no gallop and no friction rub.   Murmur heard. High pitched systolic blowing murmur heard best LUSB  Pulmonary/Chest: Effort normal and breath sounds normal. No respiratory distress. She has no wheezes. She has no rhonchi. She has no rales. She exhibits no tenderness and no crepitus.  Abdominal: Soft. Normal appearance and bowel  sounds are normal. She exhibits no distension. There is no tenderness. There is no rebound and no guarding.  Musculoskeletal: Normal range of motion. She exhibits no edema and no tenderness.  Moves all extremities well.  Mild diffused redness without warmth of both lower legs with mild swelling of lower legs R>L but nonpitting Superficial ulcer on anterior left mid leg 2x3 cm  Marked changes of arthritis in fingers   Neurological: She is alert and oriented to person, place, and time. She has normal strength. No cranial nerve deficit.  Skin: Skin is warm, dry and intact. No rash noted. No erythema. No pallor.  Psychiatric: She has a normal mood and affect. Her speech is normal and behavior is normal. Her mood appears not anxious.    ED Course  DIAGNOSTIC STUDIES: Oxygen Saturation is 89% on [place], low by my interpretation.   Medications  sodium chloride 0.9 % bolus 700 mL (0 mLs Intravenous Stopped 12/05/12 1008)  0.9 %  sodium chloride infusion ( Intravenous New Bag/Given 12/05/12 0844)  ondansetron (ZOFRAN) injection 4 mg (4 mg Intravenous Given 12/05/12 1009)  acetaminophen (TYLENOL) tablet 650 mg (650 mg Oral Given 12/05/12 1156)     COORDINATION OF CARE: 8:23 AM Discussed course of care with pt. Pt understands and agrees.  Patient was given fluids cautiously because of her history of congestive heart failure.  11:11 AM Pt mentions that she is feeling better and her pulse is now 110. She mentions that she is experiencing no pain and the nausea is better and denies chills.   Patient's fever was treated with Tylenol. I waited to treat her tachycardia hoping it would improve if her fever improved which it did. She remains borderline at about 100 for HR.   Nurse reports patient is drinking minimal fluids.   Pt agreeable to be admitted.    15:04 Dr Irene Limbo, admit to observation, tele, team 1  Procedures (including critical care time)   Results for orders placed during the hospital  encounter of 12/05/12  CULTURE, BLOOD (ROUTINE X 2)      Result Value Range   Specimen Description       Value: BLOOD BLOOD RIGHT HAND BOTTLES DRAWN AEROBIC AND ANAEROBIC   Special Requests 8CC     Culture PENDING     Report Status PENDING    CULTURE, BLOOD (ROUTINE X 2)      Result Value Range   Specimen Description BLOOD BLOOD RIGHT HAND BOTTLES DRAWN AEROBIC ONLY     Special Requests 8CC     Culture PENDING     Report Status PENDING    CBC WITH DIFFERENTIAL      Result Value Range   WBC 9.0  4.0 - 10.5 K/uL   RBC 3.08 (*) 3.87 - 5.11 MIL/uL   Hemoglobin 10.2 (*) 12.0 - 15.0 g/dL   HCT 69.6 (*) 29.5 - 28.4 %   MCV 101.3 (*) 78.0 - 100.0 fL   MCH 33.1  26.0 - 34.0 pg   MCHC 32.7  30.0 - 36.0 g/dL   RDW 13.2  44.0 - 10.2 %   Platelets 119 (*) 150 - 400 K/uL   Neutrophils Relative % 89 (*) 43 - 77 %   Neutro Abs 8.1 (*) 1.7 - 7.7 K/uL   Lymphocytes Relative 6 (*) 12 - 46 %   Lymphs Abs 0.5 (*) 0.7 - 4.0 K/uL   Monocytes Relative 5  3 - 12 %   Monocytes Absolute 0.4  0.1 - 1.0 K/uL   Eosinophils Relative 0  0 - 5 %   Eosinophils Absolute 0.0  0.0 - 0.7 K/uL   Basophils Relative 0  0 - 1 %   Basophils Absolute 0.0  0.0 - 0.1 K/uL  COMPREHENSIVE METABOLIC PANEL      Result Value Range   Sodium 140  135 - 145 mEq/L   Potassium 3.8  3.5 - 5.1 mEq/L   Chloride 104  96 - 112 mEq/L   CO2 24  19 - 32 mEq/L   Glucose, Bld 182 (*) 70 - 99 mg/dL   BUN 36 (*) 6 - 23 mg/dL   Creatinine, Ser 7.25 (*) 0.50 - 1.10 mg/dL   Calcium 8.6  8.4 - 36.6 mg/dL   Total Protein 6.6  6.0 - 8.3 g/dL   Albumin 3.4 (*) 3.5 - 5.2 g/dL   AST 18  0 - 37 U/L   ALT 9  0 - 35 U/L   Alkaline  Phosphatase 90  39 - 117 U/L   Total Bilirubin 1.5 (*) 0.3 - 1.2 mg/dL   GFR calc non Af Amer 35 (*) >90 mL/min   GFR calc Af Amer 40 (*) >90 mL/min  URINALYSIS, ROUTINE W REFLEX MICROSCOPIC      Result Value Range   Color, Urine YELLOW  YELLOW   APPearance CLEAR  CLEAR   Specific Gravity, Urine 1.020  1.005 -  1.030   pH 5.5  5.0 - 8.0   Glucose, UA NEGATIVE  NEGATIVE mg/dL   Hgb urine dipstick TRACE (*) NEGATIVE   Bilirubin Urine NEGATIVE  NEGATIVE   Ketones, ur NEGATIVE  NEGATIVE mg/dL   Protein, ur NEGATIVE  NEGATIVE mg/dL   Urobilinogen, UA 0.2  0.0 - 1.0 mg/dL   Nitrite NEGATIVE  NEGATIVE   Leukocytes, UA NEGATIVE  NEGATIVE  URINE MICROSCOPIC-ADD ON      Result Value Range   Squamous Epithelial / LPF RARE  RARE   RBC / HPF 0-2  <3 RBC/hpf   Bacteria, UA RARE  RARE  OCCULT BLOOD, POC DEVICE      Result Value Range   Fecal Occult Bld NEGATIVE  NEGATIVE   Laboratory interpretation all normal except acute worsening of her anemia of 2 g in the past 2 day, renal insufficiency  Dg Chest Portable 1 View  12/05/2012   *RADIOLOGY REPORT*  Clinical Data: Fever  PORTABLE CHEST - 1 VIEW  Comparison: 10/06/2012  Findings: Progressive right lower lobe airspace disease and right pleural effusion.  Cardiac enlargement with vascular congestion.  Left lung is clear.  IMPRESSION: Mild fluid overload  Progressive right lower lobe airspace disease and right effusion. This could be due to heart failure or pneumonia.   Original Report Authenticated By: Janeece Riggers, M.D.     Date: 12/05/2012  Rate: 121  Rhythm: atrial fibrillation  QRS Axis: right  Intervals: normal  ST/T Wave abnormalities: normal  Conduction Disutrbances:right bundle branch block  Narrative Interpretation:   Old EKG Reviewed: none available     1. Fever   2. Nausea and vomiting   3. Anemia     MDM  frail elderly female with severe underlying medical conditions including COPD, congestive heart failure, chronic oxygen at 2 L per minute and his cannula, aortic stenosis. She presented with fever and nausea and vomiting. She has had no further episodes of vomiting however she also is not taking an 80 oral fluids. Patient mainly sleeping in ED. It was felt due to her severe underlying medical condition she would benefit from observation  overnight.    I personally performed the services described in this documentation, which was scribed in my presence. The recorded information has been reviewed and considered.  Devoria Albe, MD, [facility]    Ward Givens, MD 12/05/12 [phone]

## 2012-12-05 NOTE — Progress Notes (Signed)
Pt came up from ED. Pt is alert and oriented and in NAD. Will continue to monitor.  

## 2012-12-05 NOTE — ED Notes (Signed)
Pt's step son Sander Speckman called per pt's request. [phone]

## 2012-12-05 NOTE — H&P (Signed)
History and Physical  Catherine [name] ZOX:[id number] DOB: 1923-07-15 DOA: 12/05/2012  Referring physician: Dr. Lars Mage PCP: Carylon Perches, MD   Chief Complaint: Vomiting  HPI:  79 year old woman presented to the emergency department with history of vomiting. Initial evaluation was notable for fever. Screening laboratory studies were unrevealing, urinalysis was negative and chest x-ray findings appear to be chronic. Because of her TSH and debilitated state observation was recommended.  She was recently hospitalized for acute diastolic heart failure. However she saw her primary care physician yesterday in the office was doing quite well. Last night she had chills and rigors and thought that perhaps she was hypoglycemic. This morning she again had chills and vomited and so came to the emergency department. Currently she is not vomiting. She has never had any abdominal pain. Her breathing is at baseline and she denies other complaints.  Review of Systems:  Negative for  visual changes, sore throat, rash, new muscle aches, chest pain, SOB, dysuria, bleeding, abdominal pain.  Past Medical History  Diagnosis Date  . CHF (congestive heart failure)   . Diabetes mellitus   . Hypertension   . COPD (chronic obstructive pulmonary disease)   . Vertigo   . Cancer     breast with mastectomy  . Stroke      patient denies  . Skin disorder      Epidermolysis bullosa.  . Pulmonary hypertension   . Atrial fibrillation 2011  . Multiple thyroid nodules   . Anemia   . Breast cancer   . Anemia of other chronic disease 06/09/2012  . Aortic stenosis   . Monoclonal gammopathy   . Renal disorder     CKD  . Hypothermia     Past Surgical History  Procedure Laterality Date  . Cholecystectomy    . Hemorrhoid surgery    . Abdominal hysterectomy  1991    complete hysterectomy for cancer  . Mastectomy  2005    left breast, tamoxifin  . Goiter    . Hip fracture surgery  2010    right hip replacement  . Hip  fracture surgery  2006    left hip replacement  . Colonoscopy  ?    remote  . Cataract extraction, bilateral      Social History:  reports that she has never smoked. She has never used smokeless tobacco. She reports that she does not drink alcohol or use illicit drugs.  Allergies  Allergen Reactions  . Amoxicillin-Pot Clavulanate Diarrhea    Family History  Problem Relation Age of Onset  . Colon cancer Neg Hx   . Heart attack Son     age 49s, suspected MI  . Diabetes Mother      Prior to Admission medications   Medication Sig Start Date End Date Taking? Authorizing Provider  acetaminophen (TYLENOL) 325 MG tablet Take 650 mg by mouth every 6 (six) hours as needed for pain.   Yes Historical Provider, MD  albuterol (PROAIR HFA) 108 (90 BASE) MCG/ACT inhaler Inhale 2 puffs into the lungs every 4 (four) hours as needed for wheezing.   Yes Historical Provider, MD  atorvastatin (LIPITOR) 10 MG tablet Take 10 mg by mouth at bedtime.   Yes Historical Provider, MD  Cholecalciferol (VITAMIN D) 2000 UNITS CAPS Take 1 capsule by mouth every morning.     Yes Historical Provider, MD  citalopram (CELEXA) 20 MG tablet Take 20 mg by mouth daily.   Yes Historical Provider, MD  clonazePAM (KLONOPIN) 0.5 MG tablet  Take 0.5 mg by mouth 3 (three) times daily as needed for anxiety (agitatuion).   Yes Historical Provider, MD  dabigatran (PRADAXA) 75 MG CAPS Take 75 mg by mouth every 12 (twelve) hours.   Yes Historical Provider, MD  diltiazem (CARDIZEM CD) 240 MG 24 hr capsule Take 240 mg by mouth daily.     Yes Historical Provider, MD  ferrous sulfate 325 (65 FE) MG tablet Take 325 mg by mouth daily with breakfast.   Yes Historical Provider, MD  fluocinonide cream (LIDEX) 0.05 % Apply 1 application topically 2 (two) times daily. Apply sparingly to itchy areas on trunk and extremities.   Yes Historical Provider, MD  furosemide (LASIX) 40 MG tablet Take 1 tablet (40 mg total) by mouth 2 (two) times daily.  10/09/12  Yes Carylon Perches, MD  LANTUS SOLOSTAR 100 UNIT/ML injection Inject 12 Units into the skin daily.  05/08/12  Yes Historical Provider, MD  loratadine (CLARITIN) 10 MG tablet Take 10 mg by mouth daily.   Yes Historical Provider, MD  metoprolol (LOPRESSOR) 50 MG tablet Take 50 mg by mouth 2 (two) times daily.   Yes Historical Provider, MD  mirtazapine (REMERON) 15 MG tablet Take 7.5 mg by mouth at bedtime.   Yes Historical Provider, MD  mupirocin ointment (BACTROBAN) 2 % Apply 1 application topically 2 (two) times daily. Apply thin layer to open wounds on thighs and hips.   Yes Historical Provider, MD  NOVOFINE AUTOCOVER 30G X 8 MM MISC  11/11/12  Yes Historical Provider, MD  pantoprazole (PROTONIX) 40 MG tablet Take 1 tablet (40 mg total) by mouth daily. 10/09/12  Yes Carylon Perches, MD  polyethylene glycol [facility] / GLYCOLAX) packet Take 17 g by mouth daily as needed (constipation).   Yes Historical Provider, MD  potassium chloride SA (K-DUR,KLOR-CON) 20 MEQ tablet Take 20 mEq by mouth 2 (two) times daily.   Yes Historical Provider, MD  triamcinolone cream (KENALOG) 0.1 % Apply 1 application topically daily. Apply to lower extremities once daily   Yes Historical Provider, MD   Physical Exam: Filed Vitals:   12/05/12 1130 12/05/12 1230 12/05/12 1330 12/05/12 1430  BP: 117/73 107/70 115/73 104/67  Pulse: 106 93 42 86  Temp: 98.8 F (37.1 C)  98.8 F (37.1 C)   TempSrc: Oral  Oral   Resp: 22 20 19 19   Height:      Weight:      SpO2: 93%  93%    General:  Appears calm and comfortable but very weak and debilitated. Eyes: PERRL, normal lids, irises  ENT: grossly normal hearing, lips  Neck: no LAD, masses or thyromegaly Cardiovascular: Irregular, normal rate, no r/g. Systolic murmur. 1+ bilateral lower extremity edema.Marland Kitchen Respiratory: CTA bilaterally, no w/r/r. Normal respiratory effort. Poor air movement. Abdomen: soft, ntnd, positive bowel symptoms Skin: Multiple weeping lesions bilateral hips  lower extremities. Lower extremity skin changes. Musculoskeletal: grossly normal tone BUE/BLE Psychiatric: grossly normal mood and affect, speech fluent and appropriate Neurologic: grossly non-focal.  Wt Readings from Last 3 Encounters:  12/05/12 63.504 kg (140 lb)  11/26/12 63.05 kg (139 lb)  10/22/12 62.596 kg (138 lb)    Labs on Admission:  Basic Metabolic Panel:  Recent Labs Lab 11/30/12 0745 12/05/12 0920  NA 142 140  K 4.5 3.8  CL 103 104  CO2 27 24  GLUCOSE 77 182*  BUN 41* 36*  CREATININE 1.55* 1.33*  CALCIUM 9.1 8.6    Liver Function Tests:  Recent Labs Lab 12/05/12  0920  AST 18  ALT 9  ALKPHOS 90  BILITOT 1.5*  PROT 6.6  ALBUMIN 3.4*    CBC:  Recent Labs Lab 12/03/12 1345 12/05/12 0920  WBC 4.2 9.0  NEUTROABS  --  8.1*  HGB 11.7* 10.2*  HCT 36.4 31.2*  MCV 102.2* 101.3*  PLT 148* 119*   Radiological Exams on Admission: Dg Chest Portable 1 View  12/05/2012   *RADIOLOGY REPORT*  Clinical Data: Fever  PORTABLE CHEST - 1 VIEW  Comparison: 10/06/2012  Findings: Progressive right lower lobe airspace disease and right pleural effusion.  Cardiac enlargement with vascular congestion.  Left lung is clear.  IMPRESSION: Mild fluid overload  Progressive right lower lobe airspace disease and right effusion. This could be due to heart failure or pneumonia.   Original Report Authenticated By: Janeece Riggers, M.D.    EKG: Independently reviewed. Atrial fibrillation with rapid ventricular response. Right bundle-branch block. Right bundle branch block is old.   Active Problems:   CHF (congestive heart failure)   Fever, unspecified   Pleural effusion   Nausea with vomiting   Chronic respiratory failure   Assessment/Plan 1. Fever: Etiology unclear. Hemodynamically stable. Respiratory status stable. Urinalysis negative, blood cultures pending. Vomiting is only symptom. However she has no abdominal pain. Monitor clinically. Possibly viral gastroenteritis. Food  poisoning seems less likely. 2. Right pleural effusion is again seen, somewhat larger than on last chest x-ray. CT of the chest 09/2012 revealed a moderate size pleural effusion with atelectasis right lower lobe. The patient has no respiratory symptoms and her chronic respiratory failure is stable. Therefore I doubt heart failure or pneumonia at this point. 3. Vomiting: Although she has had absolutely no abdominal pain no exam is benign we will check abdominal film. Antiemetics. Possibly viral gastroenteritis. 4. Chronic respiratory failure with hypoxia, pulmonary hypertension  5. Chronic diastolic congestive heart failure: Stable. Probably resume diuretic tomorrow. 6. Atrial fibrillation: Briefly elevated in the emergency department, likely secondary to fever. She has not taken any of her medications yet today. Continue Pradaxa, beta blocker and Cardizem. 7. Diabetes mellitus: Continue Lantus. Sliding scale insulin. 8. Anemia of chronic disease: Very close to baseline. Follows with hematology. Monitor clinically. 9. Chronic kidney disease stage III: Appears to be at baseline, better than on discharge and recent bloodwork. 10. Thrombocytopenia: Appears to be intermittent in nature. Follow clinically. 11. Monoclonal gammopathy of uncertain significance    Code Status: DNR DVT prophylaxis:  Pradaxa Family Communication: none present Disposition Plan/Anticipated LOS: observation, 2 days  Time spent: 60 minutes  Brendia Sacks, MD  [facility] Pager [phone] 12/05/2012, 4:49 PM

## 2012-12-06 LAB — CBC
Hemoglobin: 10.5 g/dL — ABNORMAL LOW (ref 12.0–15.0)
MCH: 33 pg (ref 26.0–34.0)
MCV: 103.5 fL — ABNORMAL HIGH (ref 78.0–100.0)
Platelets: 123 10*3/uL — ABNORMAL LOW (ref 150–400)
RBC: 3.18 MIL/uL — ABNORMAL LOW (ref 3.87–5.11)
WBC: 8.1 10*3/uL (ref 4.0–10.5)

## 2012-12-06 LAB — GLUCOSE, CAPILLARY
Glucose-Capillary: 131 mg/dL — ABNORMAL HIGH (ref 70–99)
Glucose-Capillary: 173 mg/dL — ABNORMAL HIGH (ref 70–99)
Glucose-Capillary: 188 mg/dL — ABNORMAL HIGH (ref 70–99)

## 2012-12-06 LAB — BASIC METABOLIC PANEL
CO2: 25 mEq/L (ref 19–32)
Glucose, Bld: 143 mg/dL — ABNORMAL HIGH (ref 70–99)
Potassium: 4.2 mEq/L (ref 3.5–5.1)
Sodium: 135 mEq/L (ref 135–145)

## 2012-12-06 MED ORDER — LORAZEPAM 2 MG/ML IJ SOLN
0.5000 mg | INTRAMUSCULAR | Status: DC | PRN
  Administered 2012-12-06 – 2012-12-08 (×3): 0.5 mg via INTRAVENOUS
  Filled 2012-12-06 (×4): qty 1

## 2012-12-06 NOTE — Progress Notes (Addendum)
Pt had multiple episodes of vomiting with no relief from zofran. MD made aware and orders received.

## 2012-12-07 ENCOUNTER — Encounter (HOSPITAL_COMMUNITY): Payer: Self-pay

## 2012-12-07 LAB — GLUCOSE, CAPILLARY: Glucose-Capillary: 136 mg/dL — ABNORMAL HIGH (ref 70–99)

## 2012-12-07 NOTE — Progress Notes (Addendum)
Bladder scanned pt and received volume of 696 mL. Pt was able to void on her own, but had a residual of 480 mL per bladder scanner. Attempted to perform a in and out catheterization of pt. Met resistance on insertion of catheter and only obtained 15 mL of urine until flow of urine stopped. Asked for assistance from another nurse, and we placed a foley catheter in. We once again met resistance and received slight flashback of urine, but were unable to obtain any more urine than the flashback. MD was called and he gave order to keep foley in and to obtain a urology consult. Will continue to monitor.

## 2012-12-07 NOTE — Consult Note (Signed)
Note [phone]

## 2012-12-07 NOTE — Progress Notes (Signed)
NAME:  Catherine [name], Catherine [name]                   ACCOUNT NO.:  [id number]  MEDICAL RECORD NO.:  [id number]  LOCATION:  A317                          FACILITY:  [facility]  PHYSICIAN:  Kingsley Callander. Ouida Sills, MD       DATE OF BIRTH:  1923-09-17  DATE OF PROCEDURE:  12/07/2012 DATE OF DISCHARGE:                                PROGRESS NOTE   Ms. Eveland had some additional vomiting yesterday, but has had no vomiting overnight and does not feel nauseated this morning.  She has remained afebrile since her initial fever at admission.  She states she has not voided overnight.  PHYSICAL EXAMINATION:  VITAL SIGNS:  Temperature is 97.3, pulse 64, blood pressure 104/68. GENERAL:  No distress. LUNGS:  Clear. HEART:  Irregular with a grade 2 systolic murmur. ABDOMEN:  Soft and nontender. EXTREMITIES:  No edema.  IMPRESSION/PLAN: 1. Gastroenteritis improving.  She was hungry this morning and wants     some scrambled eggs.  We would advance her diet to a bland diet. 2. Congestive heart failure stable.  Lasix is being held. 3. Monoclonal gammopathy of undermined significance/anemia stable. 4. Diabetes.  Glucose this morning is 188. 5. Pulmonary hypertension, stable. 6. Blood cultures thus far are negative.     Kingsley Callander. Ouida Sills, MD     ROF/MEDQ  D:  12/07/2012  T:  12/07/2012  Job:  [id number]

## 2012-12-07 NOTE — Progress Notes (Signed)
UR chart review completed.  

## 2012-12-07 NOTE — Clinical Social Work Psychosocial (Signed)
Clinical Social Work Department BRIEF PSYCHOSOCIAL ASSESSMENT 12/07/2012  Patient:  Catherine [name], Catherine [name]     Account Number:  [id number]     Admit date:  12/05/2012  Clinical Social Worker:  Nancie Neas  Date/Time:  12/07/2012 10:25 AM  Referred by:  CSW  Date Referred:  12/07/2012 Referred for  ALF Placement   Other Referral:   Interview type:  Patient Other interview type:    PSYCHOSOCIAL DATA Living Status:  FACILITY Admitted from facility:  [facility] Level of care:  Assisted Living Primary support name:  Catherine [name] Primary support relationship to patient:  FAMILY Degree of support available:   supportive per pt    CURRENT CONCERNS Current Concerns  Post-Acute Placement   Other Concerns:    SOCIAL WORK ASSESSMENT / PLAN CSW met with pt at bedside. Pt alert and oriented and reports she has been a resident at [facility] since January of this year. Pt reports she had one son who passed away 3 years ago. Her next of kin is her niece, Catherine [name] who lives in [place]. Pt indicates she checks on pt regularly, but is currently in hospital herself after surgery. Per Lupita Leash at [facility], pt is a limited assist. She primarily uses a wheelchair, but can ambulate short distances in her room using a walker. No home health prior to admission. Okay to return.   Assessment/plan status:  Psychosocial Support/Ongoing Assessment of Needs Other assessment/ plan:   Information/referral to community resources:   [employer]    PATIENT'S/FAMILY'S RESPONSE TO PLAN OF CARE: Pt reports positive feelings regarding return to [facility] when medically stable. CSW will continue to follow.       Derenda Fennel, [place] [phone]

## 2012-12-07 NOTE — Care Management Note (Signed)
    Page 1 of 2   12/11/2012     8:35:53 AM   CARE MANAGEMENT NOTE 12/11/2012  Patient:  Catherine [name], Catherine [name]   Account Number:  [id number]  Date Initiated:  12/07/2012  Documentation initiated by:  Sharrie Rothman  Subjective/Objective Assessment:   Pt admitted from [facility] with possible gastroenteritis. Pt will return to facility at discharge.     Action/Plan:   CSw to arrange discharge to facility when medically stable.   Anticipated DC Date:  12/08/2012   Anticipated DC Plan:  ASSISTED LIVING / REST HOME  In-house referral  Clinical Social Worker      DC Associate Professor  CM consult      [facility] Choice  HOME HEALTH   Choice offered to / List presented to:  C-4 Adult Children        HH arranged  HH-1 RN  HH-2 PT      HH agency  [facility]   Status of service:  Completed, signed off Medicare Important Message given?  YES (If response is "NO", the following Medicare IM given date fields will be blank) Date Medicare IM given:  12/11/2012 Date Additional Medicare IM given:    Discharge Disposition:  ASSISTED LIVING  Per UR Regulation:    If discussed at Long Length of Stay Meetings, dates discussed:   12/10/2012    Comments:  12/11/12 0820 Arlyss Queen, RN BSN CM Pt discharged back to [facility] with [facility] RN and PT. Alroy Bailiff of [facility] is aware and will collect the pts information from the chart. No DME needs noted. CSw to arrange discharge to facility. Hh services to start within 48 hours.  12/07/12 0945 Arlyss Queen, RN BSN CM

## 2012-12-07 NOTE — Progress Notes (Signed)
NAME:  Catherine [name], Catherine [name]                   ACCOUNT NO.:  [id number]  MEDICAL RECORD NO.:  [id number]  LOCATION:  A317                          FACILITY:  [facility]  PHYSICIAN:  Kingsley Callander. Ouida Sills, MD       DATE OF BIRTH:  [birth year]  DATE OF PROCEDURE:  12/06/2012 DATE OF DISCHARGE:                                PROGRESS NOTE   SUBJECTIVE:  Catherine [name] was admitted by Dr. Irene Limbo yesterday after presenting with vomiting and fever.  She denies feeling nauseated this morning.  She has not vomited since yesterday.  She denies any hematemesis.  Her bowels moved normally yesterday she states.  She had a normal white count on admission.  X-rays revealed no evidence of obstruction.  She denies any abdominal pain.  She denies any unusual food ingestion.  No others at her assisted living facility have been ill to her knowledge.  PHYSICAL EXAMINATION:  GENERAL:  No distress.  She is alert, oriented, and comfortable appearing. VITAL SIGNS:  Temperature 97.2, pulse 95, blood pressure 118/63. LUNGS:  Clear. HEART:  Irregular with a grade 2 systolic murmur. ABDOMEN:  Soft, nondistended, and nontender with no palpable organomegaly. EXTREMITIES:  Reveal no edema.  Dressings are in place over her left leg wounds.  IMPRESSION/PLAN: 1. Gastroenteritis.  She does not wish to try any crackers or toast.     We will continue clear liquids for now.  She has normal white count     today of 8.1. 2. Anemia/monoclonal gammopathy of uncertain significance, stable,     hemoglobin is 10.5.  She did not require erythropoietin at her last     Hematology Clinic visit recently. 3. Congestive heart failure, presently well compensated. 4. Atrial fibrillation.  Continue anticoagulation with Pradaxa. 5. Diabetes, well controlled.  Glucose this morning was 143.  Her     Lantus dose has been cut in half. 6. Epidermolysis bullosa, stable. 7. Right pleural effusion, asymptomatic.  Blood cultures have been obtained and are pending.   Stool was occult negative.     Kingsley Callander. Ouida Sills, MD    ROF/MEDQ  D:  12/06/2012  T:  12/06/2012  Job:  [id number]

## 2012-12-08 ENCOUNTER — Inpatient Hospital Stay (HOSPITAL_COMMUNITY): Payer: Medicare Other

## 2012-12-08 LAB — GLUCOSE, CAPILLARY
Glucose-Capillary: 149 mg/dL — ABNORMAL HIGH (ref 70–99)
Glucose-Capillary: 110 mg/dL — ABNORMAL HIGH (ref 70–99)
Glucose-Capillary: 129 mg/dL — ABNORMAL HIGH (ref 70–99)
Glucose-Capillary: 111 mg/dL — ABNORMAL HIGH (ref 70–99)

## 2012-12-08 MED ORDER — FUROSEMIDE 40 MG PO TABS
40.0000 mg | ORAL_TABLET | Freq: Two times a day (BID) | ORAL | Status: DC
  Administered 2012-12-08 (×2): 40 mg via ORAL
  Filled 2012-12-08: qty 1

## 2012-12-08 NOTE — Plan of Care (Signed)
Problem: Phase II Progression Outcomes Goal: Progress activity as tolerated unless otherwise ordered Discussed Drs current thoughts and any progress that has been made

## 2012-12-08 NOTE — Progress Notes (Signed)
Report [id number]

## 2012-12-08 NOTE — Consult Note (Signed)
NAME:  Catherine [name], Catherine [name]                   ACCOUNT NO.:  [id number]  MEDICAL RECORD NO.:  [id number]  LOCATION:  A317                          FACILITY:  [facility]  PHYSICIAN:  Ky Barban, M.D.DATE OF BIRTH:  February 18, 1924  DATE OF CONSULTATION: DATE OF DISCHARGE:                                CONSULTATION   CHIEF COMPLAINT:  Acute urinary retention.  HISTORY:  An 79 year old female presented to the emergency department with history of vomiting.  She was also found to be running fever. Urinalysis looked normal.  Chest x-ray appeared to have some chronic changes.  She was admitted for observation.  Recently hospitalized for acute diastolic heart failure.  Her temperature has been running like 99.7 today, blood pressure 104/68.  WBC count was 9000 yesterday, today is 8.1.  Both values are normal.  Hematocrit 31.2, today is 32.9.  She had abdominal ultrasound done couple of months ago.  She had small amount of ascites.  There is nothing mention about kidneys.  Apparently, they were normal.  She also had a KUB done day before yesterday.  No acute abdominal finding, this demonstrated.  The problem is that she says that she is in urinary retention, unable to void.  She is a resident of a local assisted living.  She says she was doing fine having no urinary complaints.  No history of having any urinary complaints.  No hematuria or fever or chills.  Her electrolytes done yesterday are completely normal.  Sodium 135, potassium 4.2, chloride 101, CO2 is 25, BUN is 37, creatinine 1.44.  Slightly abnormal.  WBC count is 8.1, hematocrit is 32.9.  Pelvic exam is deferred.  Has Foley catheter in place.  IMPRESSION:  Urinary retention.  Recommend renal ultrasound.  We will follow.  She probably will need cystoscopy to [facility].     Ky Barban, M.D.     MIJ/MEDQ  D:  12/07/2012  T:  12/08/2012  Job:  [id number]

## 2012-12-09 LAB — GLUCOSE, CAPILLARY
Glucose-Capillary: 194 mg/dL — ABNORMAL HIGH (ref 70–99)
Glucose-Capillary: 103 mg/dL — ABNORMAL HIGH (ref 70–99)

## 2012-12-09 LAB — BASIC METABOLIC PANEL
BUN: 46 mg/dL — ABNORMAL HIGH (ref 6–23)
Chloride: 103 mEq/L (ref 96–112)
GFR calc Af Amer: 22 mL/min — ABNORMAL LOW (ref >90)
GFR calc non Af Amer: 19 mL/min — ABNORMAL LOW (ref >90)
Potassium: 5 mEq/L (ref 3.5–5.1)
Sodium: 136 mEq/L (ref 135–145)

## 2012-12-09 NOTE — Clinical Social Work Note (Signed)
CSW notified [facility] that pt will require foley upon return for urinary retention. They are agreeable. Possible d/c tomorrow.   Derenda Fennel, [place] [phone]

## 2012-12-09 NOTE — Progress Notes (Signed)
NAME:  Catherine [name], ALVAREZ                   ACCOUNT NO.:  [id number]  MEDICAL RECORD NO.:  [id number]  LOCATION:  A317                          FACILITY:  [facility]  PHYSICIAN:  Kingsley Callander. Ouida Sills, MD       DATE OF BIRTH:  05/17/1923  DATE OF PROCEDURE:  12/09/2012 DATE OF DISCHARGE:                                PROGRESS NOTE   Catherine [name] is having adequate function of her Foley catheter now.  Urine appears to be draining well.  Her ultrasound reveals no hydronephrosis or evidence of obstruction.  Her BUN and creatinine are elevated compared to baseline now.  She is not experiencing anymore vomiting. She is afebrile.  She is able to eat and drink.  PHYSICAL EXAMINATION:  VITAL SIGNS:  Temperature 97.5, pulse 75, blood pressure 120/78. LUNGS:  Clear. HEART:  Irregular with a grade 2-3 systolic murmur. ABDOMEN:  Soft and nondistended. EXTREMITIES:  No edema.  IMPRESSION/PLAN: 1. Gastroenteritis resolved. 2. Urinary retention.  BUN and creatinine have risen to 46 and 2.16.     Lasix will be held.  She will be hydrated orally. 3. Atrial fibrillation, stable.  Continue current Pradaxa dose which     was discussed with pharmacy. 4. Diabetes.  Continue lower dose of Lantus.  Glucose was 126 on her     MET-7 and is 103 by Accu-Chek now.  PLAN: 1. To continue to utilize her Foley catheter.  Dr. Jerre Simon has     tentatively planned to perform a cystoscopy in the office following     discharge. 2. Congestive heart failure stable.  Oxygen saturations are in the 90s     on her nasal cannula. 3. Pulmonary hypertension.     Kingsley Callander. Ouida Sills, MD     ROF/MEDQ  D:  12/09/2012  T:  12/09/2012  Job:  [id number]

## 2012-12-09 NOTE — Plan of Care (Signed)
Problem: Phase I Progression Outcomes Goal: Other Phase I Outcomes/Goals Explained to pt the importance of have a good bowel movement before going home

## 2012-12-09 NOTE — Progress Notes (Signed)
NAME:  Catherine [name], Catherine [name] NO.:  [id number]  MEDICAL RECORD NO.:  [id number]  LOCATION:  A317                          FACILITY:  [facility]  PHYSICIAN:  Ky Barban, M.D.DATE OF BIRTH:  09/15/1923  DATE OF PROCEDURE: DATE OF DISCHARGE:                                PROGRESS NOTE   An 79 year old female has multiple medical problems, history of recent diastolic heart failure.  Her temperature today is 97.4, her pulse is 104 per minute, blood pressure 110/82, O2 saturation is 94.  She had a renal ultrasound done, which shows medical renal disease on both sides.  Her creatinine is slightly abnormal.  It is 1.44, BUN is 37.  There is no hydronephrosis.  No urinary tract stones.  She is still in urinary retention.  My recommendation will be that she goes home with Foley catheter, then I will see her in the office Monday next week, and do cysto CMG in the office.     Ky Barban, M.D.     MIJ/MEDQ  D:  12/08/2012  T:  12/09/2012  Job:  [id number]

## 2012-12-09 NOTE — Progress Notes (Signed)
NAME:  Catherine [name], Catherine [name]                   ACCOUNT NO.:  [id number]  MEDICAL RECORD NO.:  [id number]  LOCATION:  A317                          FACILITY:  [facility]  PHYSICIAN:  Kingsley Callander. Ouida Sills, MD       DATE OF BIRTH:  1923/11/21  DATE OF PROCEDURE:  12/08/2012 DATE OF DISCHARGE:                                PROGRESS NOTE   SUBJECTIVE:  Ms. Puryear denies any vomiting.  She is tolerating a bland diet without difficulty.  She has not had recurrent fever.  A Foley catheter was placed yesterday after she was unable to empty her bladder. She has been seen in Urology consultation by Dr. Jerre Simon.  A renal ultrasound has been ordered.  She has had an output of 1030.  OBJECTIVE:  GENERAL:  Alert in no distress. VITAL SIGNS:  Temperature 97.4, pulse 83, blood pressure 112/72. LUNGS:  Clear. HEART:  Irregular with a grade 2 systolic murmur. ABDOMEN:  Soft, nondistended, and nontender. EXTREMITIES:  Reveal no edema.  IMPRESSION/PLAN: 1. Gastroenteritis.  Symptoms have resolved.  Continue bland diet. 2. Urinary retention.  Ultrasound is pending.  Further recommendations     per urology. 3. Congestive heart failure.  Restart Lasix. 4. Pulmonary hypertension. 5. Chronic atrial fibrillation.  Stable. 6. Monoclonal gammopathy of undetermined significance, stable.     Kingsley Callander. Ouida Sills, MD     ROF/MEDQ  D:  12/08/2012  T:  12/08/2012  Job:  [id number]

## 2012-12-10 ENCOUNTER — Inpatient Hospital Stay (HOSPITAL_COMMUNITY): Payer: Medicare Other

## 2012-12-10 LAB — GLUCOSE, CAPILLARY
Glucose-Capillary: 96 mg/dL (ref 70–99)
Glucose-Capillary: 162 mg/dL — ABNORMAL HIGH (ref 70–99)

## 2012-12-10 LAB — CULTURE, BLOOD (ROUTINE X 2)
Culture: NO GROWTH
Culture: NO GROWTH

## 2012-12-10 LAB — BASIC METABOLIC PANEL
BUN: 44 mg/dL — ABNORMAL HIGH (ref 6–23)
CO2: 24 mEq/L (ref 19–32)
Calcium: 8.6 mg/dL (ref 8.4–10.5)
Glucose, Bld: 127 mg/dL — ABNORMAL HIGH (ref 70–99)
Sodium: 139 mEq/L (ref 135–145)

## 2012-12-10 MED ORDER — POLYETHYLENE GLYCOL 3350 17 G PO PACK
17.0000 g | PACK | Freq: Every day | ORAL | Status: DC
  Administered 2012-12-10 – 2012-12-11 (×2): 17 g via ORAL
  Filled 2012-12-10 (×2): qty 1

## 2012-12-10 MED ORDER — FLEET ENEMA 7-19 GM/118ML RE ENEM: 1.0000 | ENEMA | Freq: Every day | RECTAL | Status: DC | PRN

## 2012-12-10 MED ORDER — FUROSEMIDE 10 MG/ML IJ SOLN
40.0000 mg | Freq: Once | INTRAMUSCULAR | Status: AC
  Administered 2012-12-10: 40 mg via INTRAVENOUS
  Filled 2012-12-10: qty 4

## 2012-12-10 NOTE — Evaluation (Signed)
Physical Therapy Evaluation Patient Details Name: Catherine [name] MRN: [id number] DOB: Dec 15, 1923 Today's Date: 12/10/2012 Time: 0902-0928 PT Time Calculation (min): 26 min  PT Assessment / Plan / Recommendation History of Present Illness  Pt admitted for a fever from [facility] facility.  She currently has a foley catheter in place that [facility] has been made aware of.   Clinical Impression  Pt is an 79 year old female referred to PT for general mobility with impairments listed below, at this time requires mod A- min A for most mobility ( with bed and sit to stand from lowered surface). Min A with walking activities.  At this time she is mostly limited by dyspnea and O2sats limiting overall mobility.  Feel pt would continue to be safe at [facility] assisted living facility with HHPT.      PT Assessment  Patient needs continued PT services    Follow Up Recommendations  Home health PT    Does the patient have the potential to tolerate intense rehabilitation      Barriers to Discharge        Equipment Recommendations       Recommendations for Other Services     Frequency Min 3X/week    Precautions / Restrictions Restrictions Weight Bearing Restrictions: No   Pertinent Vitals/Pain Dyspnea 3/4      Mobility  Bed Mobility Bed Mobility: Rolling Right Rolling Right: 4: Min assist Transfers Transfers: Stand Pivot Transfers;Sit to Stand Sit to Stand: 3: Mod assist Stand to Sit: 3: Mod assist Stand Pivot Transfers: 3: Mod assist Ambulation/Gait Ambulation/Gait Assistance: 4: Min assist Ambulation Distance (Feet): 15 Feet Assistive device: Rolling walker Gait Pattern: Shuffle    Exercises     PT Diagnosis: Generalized weakness  PT Problem List: Decreased strength;Decreased activity tolerance;Decreased balance;Decreased mobility PT Treatment Interventions: Gait training;Stair training;Patient/family education;Therapeutic activities;Therapeutic exercise;Functional mobility  training     PT Goals(Current goals can be found in the care plan section) Acute Rehab PT Goals Patient Stated Goal: go back to [facility]  PT Goal Formulation: With patient Time For Goal Achievement: 12/17/12 Potential to Achieve Goals: Good  Visit Information  Last PT Received On: 12/10/12 History of Present Illness: Pt admitted for a fever from [facility] facility.  She currently has a foley catheter in place that [facility] has been made aware of.        Prior Functioning  Home Living Family/patient expects to be discharged to:: Assisted living Home Equipment: Walker - 2 wheels Prior Function Level of Independence: Needs assistance Gait / Transfers Assistance Needed: w/RW  Comments: for dressing and grooming provided by [employer] Communication Communication: [facility]    Cognition       Extremity/Trunk Assessment Lower Extremity Assessment Lower Extremity Assessment: Generalized weakness   Balance Balance Balance Assessed: Yes Static Sitting Balance Static Sitting - Balance Support: Feet supported;No upper extremity supported Static Sitting - Level of Assistance: 5: Stand by assistance Static Sitting - Comment/# of Minutes: 5 minutes; brushing hair, donning LE shoes w/min Assistance Static Standing Balance Static Standing - Comment/# of Minutes: able to wash hands   End of Session PT - End of Session Equipment Utilized During Treatment: Gait belt Activity Tolerance: Patient limited by fatigue Patient left: in chair Nurse Communication: Mobility status;Other (comment) (bandaging problems, left instructions with nurse secretary)  GP     Windsor Goeken, MPT, ATC 12/10/2012, 9:42 AM

## 2012-12-10 NOTE — Clinical Social Work Note (Signed)
CSW confirmed with [facility] that pt is on chronic oxygen at 2 liters there.   Derenda Fennel, [place] [phone]

## 2012-12-10 NOTE — Progress Notes (Signed)
NAME:  Catherine [name], Catherine [name]                   ACCOUNT NO.:  [id number]  MEDICAL RECORD NO.:  [id number]  LOCATION:                        FACILITY:  [facility]  PHYSICIAN:  Kingsley Callander. Ouida Sills, MD       DATE OF BIRTH:  [birth year]  DATE OF PROCEDURE: DATE OF DISCHARGE:                                PROGRESS NOTE   SUBJECTIVE:  Catherine [name] now complains that she cannot have a bowel movement.  She states that she feels too weak to go back to [employer]. Her Foley catheter is draining effectively.  PHYSICAL EXAMINATION:  VITAL SIGNS:  Temperature 97.5, pulse 82, blood pressure 118/81. LUNGS:  Clear. HEART:  Irregularly irregular with a grade 2-3 systolic murmur. ABDOMEN:  Soft and nontender. EXTREMITIES:  Resolution of edema.  I and O yesterday was a -1490.  IMPRESSION/PLAN: 1. Acute on chronic renal failure.  BUN and creatinine are improved to     44 and 1.88 after establishment of adequate drainage with a Foley     catheter.  Lasix is being held.  Potassium is being held.  Sodium     is 139 and potassium is 4.8. 2. Atrial fibrillation.  Continue Pradaxa. 3. Constipation.  Treat with MiraLAX today. 4. Generalized weakness.  We will ask physical therapy to help assess     and treat.     Kingsley Callander. Ouida Sills, MD     ROF/MEDQ  D:  12/10/2012  T:  12/10/2012  Job:  [id number]

## 2012-12-11 LAB — BASIC METABOLIC PANEL
CO2: 28 mEq/L (ref 19–32)
Calcium: 9 mg/dL (ref 8.4–10.5)
Chloride: 106 mEq/L (ref 96–112)
Creatinine, Ser: 1.68 mg/dL — ABNORMAL HIGH (ref 0.50–1.10)
Glucose, Bld: 86 mg/dL (ref 70–99)

## 2012-12-11 LAB — GLUCOSE, CAPILLARY: Glucose-Capillary: 122 mg/dL — ABNORMAL HIGH (ref 70–99)

## 2012-12-11 MED ORDER — ALBUTEROL SULFATE (5 MG/ML) 0.5% IN NEBU: 2.5000 mg | INHALATION_SOLUTION | RESPIRATORY_TRACT | Status: DC | PRN

## 2012-12-11 MED ORDER — ALBUTEROL SULFATE (5 MG/ML) 0.5% IN NEBU
INHALATION_SOLUTION | RESPIRATORY_TRACT | Status: AC
  Administered 2012-12-11: 2.5 mg via RESPIRATORY_TRACT
  Filled 2012-12-11: qty 0.5

## 2012-12-11 NOTE — Discharge Summary (Signed)
NAME:  Catherine [name], Catherine [name]                   ACCOUNT NO.:  [id number]  MEDICAL RECORD NO.:  [id number]  LOCATION:  A317                          FACILITY:  [facility]  PHYSICIAN:  Kingsley Callander. Ouida Sills, MD       DATE OF BIRTH:  06-26-1923  DATE OF ADMISSION:  12/05/2012 DATE OF DISCHARGE:  08/15/2014LH                              DISCHARGE SUMMARY   DISCHARGE DIAGNOSES: 1. Gastroenteritis. 2. Urinary retention. 3. Pulmonary hypertension. 4. Chronic diastolic heart failure. 5. Diabetes. 6. Hypertension. 7. History of breast cancer. 8. Epidermolysis bullosa. 9. Chronic atrial fibrillation. 10.Monoclonal gammopathy of uncertain significance. 11.Anemia. 12.Aortic stenosis. 13.Chronic kidney disease.  DISCHARGE MEDICATIONS: 1. Acetaminophen 650 mg q.6 h. p.r.n. 2. ProAir 2 puffs every 4 hours as needed. 3. Atorvastatin 10 mg at bedtime. 4. Vitamin D 2000 units daily. 5. Citalopram 20 mg daily. 6. Klonopin 0.5 mg t.i.d. p.r.n. 7. Pradaxa 75 mg b.i.d. 8. Cardizem CD 240 mg daily. 9. Ferrous sulfate 325 mg daily. 10.Lidex 0.05% apply b.i.d. to itchy areas on trunk and extremities. 11.Lasix 40 mg b.i.d. 12.Lantus 12 units daily. 13.Claritin 10 mg daily. 14.Lopressor 50 mg b.i.d. 15.Remeron 7.5 mg at bedtime. 16.Bactroban 2% ointment apply b.i.d. to the wounds on the thighs and     hips. 17.Protonix 40 mg daily. 18.MiraLAX 17 g daily. 19.Potassium 20 mEq b.i.d. 20.Kenalog 0.1% apply topically daily to the lower extremities.  HOSPITAL COURSE:  This patient is an 79 year old female who presented with vomiting and fever of 101.  White count was 9000 with 89 segs and 6 lymphs.  Blood cultures were negative.  Stool culture was negative. Fecal occult blood was negative.  Chest x-ray was consistent with cardiac enlargement and vascular congestion with a right pleural effusion.  Urinalysis was negative.  Vomiting was treated with antiemetics as needed and resolved.  Her fever resolved.  Diabetes  remained stable on Lantus and sliding scale NovoLog.  She developed urinary retention requiring Foley catheter placement.  She was seen in consultation from urologic standpoint by Dr. Jerre Simon.  She underwent an ultrasound of the kidneys, which revealed no hydronephrosis.  She will be seen in followup at his office for possible cystoscopic examination next week.  BUN and creatinine rose to 46 and 2.16, and has since improved to 38 and 1.68, which is near her baseline. She does have underlying chronic kidney disease stage III.  Lasix was held and then restarted.  She was treated with 1 dose of IV Lasix on the 14th.  She has stable chronic diastolic heart failure.  She developed constipation as well during hospitalization requiring Fleet's Enema.  She will be continued on MiraLAX as needed.  She has been stable from the standpoint of her epidermolysis bullosa with continued topical treatments.  Pulmonary hypertension has been stable.  She is maintained on chronic oxygen therapy.  The patient's condition is improved on the 15th.  She is stable for discharge.  She will be seen in followup in the office in 1 week and followup by Dr. Jerre Simon next week for her Foley catheter management. Home health is being consulted for care at [facility].     Kingsley Callander.  Ouida Sills, MD     ROF/MEDQ  D:  12/11/2012  T:  12/11/2012  Job:  [id number]

## 2012-12-11 NOTE — Progress Notes (Signed)
UR chart review completed.  

## 2012-12-11 NOTE — Progress Notes (Signed)
Pt's abdomen distended.  Pt c/o of no pain.  Last BM yesterday.  Pt passing gas today.  Dr. Ouida Sills notified.  Gave order to continue to monitor and follow-up with him within one week per discharge summary.  Orders followed.

## 2012-12-11 NOTE — Progress Notes (Signed)
AVS reviewed with caregiver from [facility].  Verbalized understanding of d/c instructions.  Pt's IV removed.  Site WNL.  Packet give to caregiver from facility.  Teach back method used.  Pt transported by NT via w/c to main entrance for d/c.  Pt stable at time of d/c.

## 2012-12-11 NOTE — Clinical Social Work Note (Signed)
Pt d/c today back to [facility] with foley. Pt will have home health PT/RN. Facility aware. CSW faxed d/c summary and FL2. Facility will provide transport. Notified pt's step-son Nash Dimmer as well.   Derenda Fennel, [place] [phone]

## 2012-12-22 ENCOUNTER — Other Ambulatory Visit (HOSPITAL_COMMUNITY): Payer: Self-pay | Admitting: Internal Medicine

## 2012-12-22 DIAGNOSIS — Z139 Encounter for screening, unspecified: Secondary | ICD-10-CM

## 2012-12-29 ENCOUNTER — Ambulatory Visit (HOSPITAL_COMMUNITY)
Admission: RE | Admit: 2012-12-29 | Discharge: 2012-12-29 | Disposition: A | Discharge status: Home / Self Care | Payer: Medicare Other | Source: Ambulatory Visit | Attending: Internal Medicine | Admitting: Internal Medicine

## 2012-12-29 DIAGNOSIS — Z1231 Encounter for screening mammogram for malignant neoplasm of breast: Secondary | ICD-10-CM | POA: Insufficient documentation

## 2012-12-29 DIAGNOSIS — Z139 Encounter for screening, unspecified: Secondary | ICD-10-CM

## 2012-12-31 ENCOUNTER — Encounter (HOSPITAL_COMMUNITY): Payer: Medicare Other | Attending: Oncology

## 2012-12-31 ENCOUNTER — Encounter (HOSPITAL_COMMUNITY): Payer: Medicare Other

## 2012-12-31 DIAGNOSIS — D472 Monoclonal gammopathy: Secondary | ICD-10-CM | POA: Insufficient documentation

## 2012-12-31 DIAGNOSIS — D638 Anemia in other chronic diseases classified elsewhere: Secondary | ICD-10-CM | POA: Insufficient documentation

## 2012-12-31 DIAGNOSIS — D62 Acute posthemorrhagic anemia: Secondary | ICD-10-CM | POA: Insufficient documentation

## 2012-12-31 LAB — CBC
HCT: 36 % (ref 36.0–46.0)
MCH: 32.8 pg (ref 26.0–34.0)
MCHC: 32.2 g/dL (ref 30.0–36.0)
RDW: 15.1 % (ref 11.5–15.5)

## 2012-12-31 NOTE — Progress Notes (Signed)
Aranesp held, Hgb 11.6. Pt has not needed Aranesp since April. Pt missed MD appt in May. Scheduled MD appt for next week to evaluate continued need for therapy.

## 2013-01-04 ENCOUNTER — Ambulatory Visit (HOSPITAL_COMMUNITY): Payer: Medicare Other

## 2013-01-06 ENCOUNTER — Encounter (HOSPITAL_COMMUNITY): Payer: Self-pay

## 2013-01-06 ENCOUNTER — Inpatient Hospital Stay (HOSPITAL_COMMUNITY)
Admission: EM | Admit: 2013-01-06 | Discharge: 2013-01-08 | DRG: 378 | Disposition: A | Discharge status: Other Acute Inpatient Hospital | Payer: Medicare Other | Attending: Internal Medicine | Admitting: Internal Medicine

## 2013-01-06 DIAGNOSIS — K5790 Diverticulosis of intestine, part unspecified, without perforation or abscess without bleeding: Secondary | ICD-10-CM | POA: Diagnosis not present

## 2013-01-06 DIAGNOSIS — I4891 Unspecified atrial fibrillation: Secondary | ICD-10-CM | POA: Diagnosis present

## 2013-01-06 DIAGNOSIS — E119 Type 2 diabetes mellitus without complications: Secondary | ICD-10-CM | POA: Diagnosis present

## 2013-01-06 DIAGNOSIS — Z96649 Presence of unspecified artificial hip joint: Secondary | ICD-10-CM

## 2013-01-06 DIAGNOSIS — Z23 Encounter for immunization: Secondary | ICD-10-CM

## 2013-01-06 DIAGNOSIS — I5032 Chronic diastolic (congestive) heart failure: Secondary | ICD-10-CM | POA: Diagnosis present

## 2013-01-06 DIAGNOSIS — D696 Thrombocytopenia, unspecified: Secondary | ICD-10-CM | POA: Diagnosis present

## 2013-01-06 DIAGNOSIS — E876 Hypokalemia: Secondary | ICD-10-CM | POA: Diagnosis present

## 2013-01-06 DIAGNOSIS — D472 Monoclonal gammopathy: Secondary | ICD-10-CM | POA: Diagnosis present

## 2013-01-06 DIAGNOSIS — I2789 Other specified pulmonary heart diseases: Secondary | ICD-10-CM | POA: Diagnosis present

## 2013-01-06 DIAGNOSIS — I10 Essential (primary) hypertension: Secondary | ICD-10-CM | POA: Diagnosis present

## 2013-01-06 DIAGNOSIS — Z9010 Acquired absence of unspecified breast and nipple: Secondary | ICD-10-CM

## 2013-01-06 DIAGNOSIS — K5731 Diverticulosis of large intestine without perforation or abscess with bleeding: Principal | ICD-10-CM | POA: Diagnosis present

## 2013-01-06 DIAGNOSIS — E1169 Type 2 diabetes mellitus with other specified complication: Secondary | ICD-10-CM | POA: Diagnosis present

## 2013-01-06 DIAGNOSIS — I359 Nonrheumatic aortic valve disorder, unspecified: Secondary | ICD-10-CM | POA: Diagnosis present

## 2013-01-06 DIAGNOSIS — Z79899 Other long term (current) drug therapy: Secondary | ICD-10-CM

## 2013-01-06 DIAGNOSIS — D638 Anemia in other chronic diseases classified elsewhere: Secondary | ICD-10-CM | POA: Diagnosis present

## 2013-01-06 DIAGNOSIS — Q828 Other specified congenital malformations of skin: Secondary | ICD-10-CM

## 2013-01-06 DIAGNOSIS — N183 Chronic kidney disease, stage 3 unspecified: Secondary | ICD-10-CM | POA: Diagnosis present

## 2013-01-06 DIAGNOSIS — J4489 Other specified chronic obstructive pulmonary disease: Secondary | ICD-10-CM | POA: Diagnosis present

## 2013-01-06 DIAGNOSIS — E11622 Type 2 diabetes mellitus with other skin ulcer: Secondary | ICD-10-CM | POA: Diagnosis present

## 2013-01-06 DIAGNOSIS — J449 Chronic obstructive pulmonary disease, unspecified: Secondary | ICD-10-CM | POA: Diagnosis present

## 2013-01-06 DIAGNOSIS — I129 Hypertensive chronic kidney disease with stage 1 through stage 4 chronic kidney disease, or unspecified chronic kidney disease: Secondary | ICD-10-CM | POA: Diagnosis present

## 2013-01-06 DIAGNOSIS — I509 Heart failure, unspecified: Secondary | ICD-10-CM | POA: Diagnosis present

## 2013-01-06 DIAGNOSIS — Z853 Personal history of malignant neoplasm of breast: Secondary | ICD-10-CM

## 2013-01-06 DIAGNOSIS — N184 Chronic kidney disease, stage 4 (severe): Secondary | ICD-10-CM | POA: Diagnosis present

## 2013-01-06 DIAGNOSIS — Z66 Do not resuscitate: Secondary | ICD-10-CM | POA: Diagnosis present

## 2013-01-06 DIAGNOSIS — Z9981 Dependence on supplemental oxygen: Secondary | ICD-10-CM

## 2013-01-06 DIAGNOSIS — K625 Hemorrhage of anus and rectum: Secondary | ICD-10-CM | POA: Diagnosis present

## 2013-01-06 DIAGNOSIS — L280 Lichen simplex chronicus: Secondary | ICD-10-CM | POA: Insufficient documentation

## 2013-01-06 DIAGNOSIS — J9610 Chronic respiratory failure, unspecified whether with hypoxia or hypercapnia: Secondary | ICD-10-CM | POA: Diagnosis present

## 2013-01-06 DIAGNOSIS — D72819 Decreased white blood cell count, unspecified: Secondary | ICD-10-CM | POA: Diagnosis present

## 2013-01-06 DIAGNOSIS — L97309 Non-pressure chronic ulcer of unspecified ankle with unspecified severity: Secondary | ICD-10-CM | POA: Diagnosis present

## 2013-01-06 DIAGNOSIS — D62 Acute posthemorrhagic anemia: Secondary | ICD-10-CM | POA: Diagnosis present

## 2013-01-06 DIAGNOSIS — Z7901 Long term (current) use of anticoagulants: Secondary | ICD-10-CM

## 2013-01-06 HISTORY — DX: Diverticulosis of intestine, part unspecified, without perforation or abscess without bleeding: K57.90

## 2013-01-06 LAB — CBC
HCT: 32.9 % — ABNORMAL LOW (ref 36.0–46.0)
Hemoglobin: 10.6 g/dL — ABNORMAL LOW (ref 12.0–15.0)
RDW: 15.4 % (ref 11.5–15.5)
WBC: 3.9 10*3/uL — ABNORMAL LOW (ref 4.0–10.5)

## 2013-01-06 LAB — CBC WITH DIFFERENTIAL/PLATELET
Basophils Absolute: 0 10*3/uL (ref 0.0–0.1)
Basophils Relative: 1 % (ref 0–1)
Eosinophils Absolute: 0.2 10*3/uL (ref 0.0–0.7)
Eosinophils Relative: 5 % (ref 0–5)
Lymphs Abs: 0.5 10*3/uL — ABNORMAL LOW (ref 0.7–4.0)
MCH: 32.8 pg (ref 26.0–34.0)
MCV: 101.5 fL — ABNORMAL HIGH (ref 78.0–100.0)
Neutrophils Relative %: 72 % (ref 43–77)
Platelets: 139 10*3/uL — ABNORMAL LOW (ref 150–400)
RBC: 3.29 MIL/uL — ABNORMAL LOW (ref 3.87–5.11)
RDW: 15.7 % — ABNORMAL HIGH (ref 11.5–15.5)

## 2013-01-06 LAB — COMPREHENSIVE METABOLIC PANEL
ALT: 12 U/L (ref 0–35)
AST: 23 U/L (ref 0–37)
Albumin: 3.7 g/dL (ref 3.5–5.2)
Alkaline Phosphatase: 114 U/L (ref 39–117)
Calcium: 9.5 mg/dL (ref 8.4–10.5)
Potassium: 4 mEq/L (ref 3.5–5.1)
Sodium: 139 mEq/L (ref 135–145)
Total Protein: 7.3 g/dL (ref 6.0–8.3)

## 2013-01-06 LAB — TYPE AND SCREEN
ABO/RH(D): O POS
Antibody Screen: NEGATIVE

## 2013-01-06 LAB — GLUCOSE, CAPILLARY
Glucose-Capillary: 93 mg/dL (ref 70–99)
Glucose-Capillary: 134 mg/dL — ABNORMAL HIGH (ref 70–99)

## 2013-01-06 LAB — HEMOGLOBIN A1C: Mean Plasma Glucose: 151 mg/dL — ABNORMAL HIGH (ref <117)

## 2013-01-06 LAB — OCCULT BLOOD, POC DEVICE: Fecal Occult Bld: POSITIVE — AB

## 2013-01-06 LAB — SAMPLE TO BLOOD BANK

## 2013-01-06 LAB — MRSA PCR SCREENING: MRSA by PCR: NEGATIVE

## 2013-01-06 MED ORDER — INFLUENZA VAC SPLIT QUAD 0.5 ML IM SUSP
0.5000 mL | INTRAMUSCULAR | Status: AC
  Administered 2013-01-07: 0.5 mL via INTRAMUSCULAR
  Filled 2013-01-06: qty 0.5

## 2013-01-06 MED ORDER — CLONAZEPAM 0.5 MG PO TABS: 0.5000 mg | ORAL_TABLET | Freq: Three times a day (TID) | ORAL | Status: DC | PRN

## 2013-01-06 MED ORDER — BIOTENE DRY MOUTH MT LIQD
15.0000 mL | Freq: Two times a day (BID) | OROMUCOSAL | Status: DC
  Administered 2013-01-06 – 2013-01-07 (×3): 15 mL via OROMUCOSAL

## 2013-01-06 MED ORDER — ONDANSETRON HCL 4 MG/2ML IJ SOLN: 4.0000 mg | Freq: Four times a day (QID) | INTRAMUSCULAR | Status: DC | PRN

## 2013-01-06 MED ORDER — BIOTENE DRY MOUTH MT LIQD
15.0000 mL | Freq: Two times a day (BID) | OROMUCOSAL | Status: DC
  Administered 2013-01-07: 15 mL via OROMUCOSAL

## 2013-01-06 MED ORDER — ONDANSETRON HCL 4 MG PO TABS: 4.0000 mg | ORAL_TABLET | Freq: Four times a day (QID) | ORAL | Status: DC | PRN

## 2013-01-06 MED ORDER — ACETAMINOPHEN 325 MG PO TABS: 650.0000 mg | ORAL_TABLET | Freq: Four times a day (QID) | ORAL | Status: DC | PRN

## 2013-01-06 MED ORDER — MIRTAZAPINE 15 MG PO TABS
7.5000 mg | ORAL_TABLET | Freq: Every day | ORAL | Status: DC
  Administered 2013-01-06 – 2013-01-07 (×2): 7.5 mg via ORAL
  Filled 2013-01-06 (×5): qty 0.5

## 2013-01-06 MED ORDER — HYDROMORPHONE HCL PF 1 MG/ML IJ SOLN: 0.5000 mg | INTRAMUSCULAR | Status: DC | PRN

## 2013-01-06 MED ORDER — SODIUM CHLORIDE 0.9 % IV SOLN: INTRAVENOUS | Status: DC

## 2013-01-06 MED ORDER — METOPROLOL TARTRATE 50 MG PO TABS
50.0000 mg | ORAL_TABLET | Freq: Two times a day (BID) | ORAL | Status: DC
  Administered 2013-01-06 – 2013-01-08 (×4): 50 mg via ORAL
  Filled 2013-01-06 (×4): qty 1

## 2013-01-06 MED ORDER — SODIUM CHLORIDE 0.9 % IJ SOLN
3.0000 mL | Freq: Two times a day (BID) | INTRAMUSCULAR | Status: DC
  Administered 2013-01-06 – 2013-01-07 (×2): 3 mL via INTRAVENOUS
  Filled 2013-01-06: qty 3

## 2013-01-06 MED ORDER — LORATADINE 10 MG PO TABS
10.0000 mg | ORAL_TABLET | Freq: Every day | ORAL | Status: DC
  Administered 2013-01-07 – 2013-01-08 (×2): 10 mg via ORAL
  Filled 2013-01-06 (×2): qty 1

## 2013-01-06 MED ORDER — INSULIN ASPART 100 UNIT/ML ~~LOC~~ SOLN
0.0000 [IU] | Freq: Three times a day (TID) | SUBCUTANEOUS | Status: DC
  Administered 2013-01-07 – 2013-01-08 (×2): 3 [IU] via SUBCUTANEOUS

## 2013-01-06 MED ORDER — ATORVASTATIN CALCIUM 10 MG PO TABS
10.0000 mg | ORAL_TABLET | Freq: Every day | ORAL | Status: DC
Start: 2013-01-06 — End: 2013-01-08
  Administered 2013-01-06 – 2013-01-07 (×2): 10 mg via ORAL
  Filled 2013-01-06 (×2): qty 1

## 2013-01-06 MED ORDER — SODIUM CHLORIDE 0.9 % IV SOLN
Freq: Once | INTRAVENOUS | Status: AC
  Administered 2013-01-06: 12:00:00 via INTRAVENOUS

## 2013-01-06 MED ORDER — CITALOPRAM HYDROBROMIDE 20 MG PO TABS
20.0000 mg | ORAL_TABLET | Freq: Every day | ORAL | Status: DC
  Administered 2013-01-07 – 2013-01-08 (×2): 20 mg via ORAL
  Filled 2013-01-06 (×2): qty 1

## 2013-01-06 MED ORDER — FLUOCINONIDE 0.05 % EX CREA
1.0000 "application " | TOPICAL_CREAM | Freq: Two times a day (BID) | CUTANEOUS | Status: DC
  Administered 2013-01-06 – 2013-01-08 (×4): 1 via TOPICAL
  Filled 2013-01-06: qty 30

## 2013-01-06 MED ORDER — ACETAMINOPHEN 650 MG RE SUPP: 650.0000 mg | Freq: Four times a day (QID) | RECTAL | Status: DC | PRN

## 2013-01-06 MED ORDER — MUPIROCIN 2 % EX OINT
1.0000 "application " | TOPICAL_OINTMENT | Freq: Two times a day (BID) | CUTANEOUS | Status: DC
  Administered 2013-01-06 – 2013-01-07 (×2): 1 via TOPICAL
  Filled 2013-01-06: qty 22

## 2013-01-06 MED ORDER — ALBUTEROL SULFATE HFA 108 (90 BASE) MCG/ACT IN AERS: 2.0000 | INHALATION_SPRAY | RESPIRATORY_TRACT | Status: DC | PRN

## 2013-01-06 MED ORDER — TRIAMCINOLONE ACETONIDE 0.1 % EX CREA
1.0000 "application " | TOPICAL_CREAM | Freq: Every day | CUTANEOUS | Status: DC
  Administered 2013-01-07 – 2013-01-08 (×2): 1 via TOPICAL
  Filled 2013-01-06: qty 15

## 2013-01-06 MED ORDER — INSULIN GLARGINE 100 UNIT/ML ~~LOC~~ SOLN
6.0000 [IU] | Freq: Every day | SUBCUTANEOUS | Status: DC
  Administered 2013-01-06 – 2013-01-07 (×2): 6 [IU] via SUBCUTANEOUS
  Filled 2013-01-06 (×5): qty 0.06

## 2013-01-06 MED ORDER — PANTOPRAZOLE SODIUM 40 MG IV SOLR
40.0000 mg | INTRAVENOUS | Status: DC
Start: 2013-01-06 — End: 2013-01-08
  Administered 2013-01-06 – 2013-01-07 (×2): 40 mg via INTRAVENOUS
  Filled 2013-01-06 (×2): qty 40

## 2013-01-06 MED ORDER — DILTIAZEM HCL ER COATED BEADS 240 MG PO CP24: 240.0000 mg | ORAL_CAPSULE | Freq: Every day | ORAL | Status: DC

## 2013-01-06 NOTE — ED Notes (Signed)
Report given to Megan, RN.

## 2013-01-06 NOTE — H&P (Signed)
[facility] History and Physical  [name]:[id number] DOB: [birth year] DOA: 01/06/2013  Referring physician:  PCP: Carylon Perches, MD  Specialists:   Chief Complaint: rectal bleed  HPI: Catherine [name] is a 79 y.o. female with a past medical history that includes chronic diastolic heart failure, diabetes, hypertension, COPD on home O2, chronic respiratory failure, atrial fib lumbar DEXA, anemia, aortic stenosis, chronic kidney disease presents to emergency department from a facility with the chief complaint of rectal bleeding. Information is obtained from the patient. She reports that about 2 days ago she began experiencing bright red blood per rectum with each bowel movement. She indicates that she sees bright red blood in the toilet and on the toilet paper. She indicates that she's been having multiple bowel movements per day and while there is some stool it is mostly blood. She also reports that she had hemorrhoid surgery decades ago and was concerned that she may have hemorrhoids again. She denies any pain with bowel movement she denies any pain or itching or swelling in the rectal area. He also reports that she worked "40 years younger I would have thought I was having a menstrual cycle". She denies any abdominal pain cramping nausea vomiting. She denies any chest pain palpitations worsening shortness of breath dizziness syncope or near-syncope. She indicates that she had related to a walker at the facility and has had no recent falls. She does report persistent "water blisters" on her left lower leg that are painful. Lab work in the emergency department significant for a BUN of 57 creatinine of 1.6 glucose 136 hemoglobin 10.8 platelets 139. Her INR is 1.92 and FOBT is positive. Vital signs are stable. Symptoms came on suddenly have persisted characterized as moderate. Nothing makes better or worse. [facility] are asked to admit   Review of Systems: 14 point review of systems complete and  all systems are negative except as noted in the history of present illness above  Past Medical History  Diagnosis Date  . CHF (congestive heart failure)   . Diabetes mellitus   . Hypertension   . COPD (chronic obstructive pulmonary disease)   . Vertigo   . Cancer     breast with mastectomy  . Stroke      patient denies  . Skin disorder      Epidermolysis bullosa.  . Pulmonary hypertension   . Atrial fibrillation 2011  . Multiple thyroid nodules   . Anemia   . Breast cancer   . Anemia of other chronic disease 06/09/2012  . Aortic stenosis   . Monoclonal gammopathy   . Hypothermia   . Renal disorder     CKD   Past Surgical History  Procedure Laterality Date  . Cholecystectomy    . Hemorrhoid surgery    . Abdominal hysterectomy  1991    complete hysterectomy for cancer  . Mastectomy  2005    left breast, tamoxifin  . Goiter    . Hip fracture surgery  2010    right hip replacement  . Hip fracture surgery  2006    left hip replacement  . Colonoscopy  ?    remote  . Cataract extraction, bilateral     Social History:  reports that she has never smoked. She has never used smokeless tobacco. She reports that she does not drink alcohol or use illicit drugs. Patient is retired. She lives in a long-term care facility named [facility]. She states that she ambulates with a walker appear Allergies  Allergen Reactions  . Amoxicillin-Pot Clavulanate Diarrhea    Family History  Problem Relation Age of Onset  . Colon cancer Neg Hx   . Heart attack Son     age 18s, suspected MI  . Diabetes Mother     Prior to Admission medications   Medication Sig Start Date End Date Taking? Authorizing Provider  acetaminophen (TYLENOL) 325 MG tablet Take 650 mg by mouth every 6 (six) hours as needed for pain.   Yes Historical Provider, MD  albuterol (PROAIR HFA) 108 (90 BASE) MCG/ACT inhaler Inhale 2 puffs into the lungs every 4 (four) hours as needed for wheezing.   Yes Historical Provider,  MD  atorvastatin (LIPITOR) 10 MG tablet Take 10 mg by mouth at bedtime.   Yes Historical Provider, MD  Cholecalciferol (VITAMIN D) 2000 UNITS CAPS Take 1 capsule by mouth every morning.     Yes Historical Provider, MD  citalopram (CELEXA) 20 MG tablet Take 20 mg by mouth daily.   Yes Historical Provider, MD  clonazePAM (KLONOPIN) 0.5 MG tablet Take 0.5 mg by mouth 3 (three) times daily as needed for anxiety (agitatuion).   Yes Historical Provider, MD  dabigatran (PRADAXA) 75 MG CAPS Take 75 mg by mouth every 12 (twelve) hours.   Yes Historical Provider, MD  diltiazem (CARDIZEM CD) 240 MG 24 hr capsule Take 240 mg by mouth daily.     Yes Historical Provider, MD  ferrous sulfate 325 (65 FE) MG tablet Take 325 mg by mouth daily with breakfast.   Yes Historical Provider, MD  fluocinonide cream (LIDEX) 0.05 % Apply 1 application topically 2 (two) times daily. Apply sparingly to itchy areas on trunk and extremities.   Yes Historical Provider, MD  furosemide (LASIX) 40 MG tablet Take 1 tablet (40 mg total) by mouth 2 (two) times daily. 10/09/12  Yes Carylon Perches, MD  LANTUS SOLOSTAR 100 UNIT/ML injection Inject 12 Units into the skin daily.  05/08/12  Yes Historical Provider, MD  loratadine (CLARITIN) 10 MG tablet Take 10 mg by mouth daily.   Yes Historical Provider, MD  metoprolol (LOPRESSOR) 50 MG tablet Take 50 mg by mouth 2 (two) times daily.   Yes Historical Provider, MD  mirtazapine (REMERON) 15 MG tablet Take 7.5 mg by mouth at bedtime.   Yes Historical Provider, MD  mupirocin ointment (BACTROBAN) 2 % Apply 1 application topically 2 (two) times daily. Apply thin layer to open wounds on thighs and hips.   Yes Historical Provider, MD  pantoprazole (PROTONIX) 40 MG tablet Take 1 tablet (40 mg total) by mouth daily. 10/09/12  Yes Carylon Perches, MD  polyethylene glycol [facility] / GLYCOLAX) packet Take 17 g by mouth daily as needed (constipation).   Yes Historical Provider, MD  potassium chloride SA  (K-DUR,KLOR-CON) 20 MEQ tablet Take 20 mEq by mouth 2 (two) times daily.   Yes Historical Provider, MD  triamcinolone cream (KENALOG) 0.1 % Apply 1 application topically daily. Apply to lower extremities once daily   Yes Historical Provider, MD   Physical Exam: Filed Vitals:   01/06/13 1429  BP: 122/64  Pulse: 75  Temp:   Resp: 18     General:  Somewhat frail appearing no acute distress  Eyes: PERRLA, EOMI, no scleral icterus  ENT: Ears somewhat hard of hearing. Nose without drainage. Mucous membranes of her mouth are slightly dry slightly pale  Neck: Supple no JVD full range of motion no lymphadenopathy  Cardiovascular: Irregularly irregular. Positive murmur no gallop no rub  trace lower extremity edema bilaterally. Venous changes to lower extremities. Two quarter size open blisters on left shin medial aspect no drainage no odor  Respiratory: Normal effort breath sounds distant but clear no wheeze no crackles  Abdomen: Flat soft positive bowel sounds throughout nontender to palpation no mass organomegaly noted no guarding  Skin: Bilateral hips with healing lesions as well as bilateral lower extremity changes.  Musculoskeletal: No clubbing no cyanosis  Psychiatric: Calm cooperative  Neurologic: Speech clear facial symmetry cranial nerves II through XII grossly intact  Labs on Admission:  Basic Metabolic Panel:  Recent Labs Lab 01/06/13 1151  NA 139  K 4.0  CL 101  CO2 27  GLUCOSE 136*  BUN 57*  CREATININE 1.66*  CALCIUM 9.5   Liver Function Tests:  Recent Labs Lab 01/06/13 1151  AST 23  ALT 12  ALKPHOS 114  BILITOT 0.9  PROT 7.3  ALBUMIN 3.7   No results found for this basename: LIPASE, AMYLASE,  in the last 168 hours No results found for this basename: AMMONIA,  in the last 168 hours CBC:  Recent Labs Lab 12/31/12 1303 01/06/13 1151  WBC 3.9* 4.0  NEUTROABS  --  2.9  HGB 11.6* 10.8*  HCT 36.0 33.4*  MCV 101.7* 101.5*  PLT 143* 139*    Cardiac Enzymes: No results found for this basename: CKTOTAL, CKMB, CKMBINDEX, TROPONINI,  in the last 168 hours  BNP (last 3 results)  Recent Labs  10/06/12 0050 10/09/12 0445 10/22/12 1540  PROBNP 4853.0* 4038.0* 6911.00*   CBG: No results found for this basename: GLUCAP,  in the last 168 hours  Radiological Exams on Admission: No results found.  EKG:   Assessment/Plan Principal Problem:   BRBPR (bright red blood per rectum): Patient with history of diverticulitis and colitis several years ago. She's also on Peridex. Will admit to telemetry. Will provide clear liquids. Will make n.p.o. after midnight. Will get serial CBCs. Her current hemoglobin of 10.8 is not very far from her baseline however it is down from 11.6 5 days ago. She is currently asymptomatic. Will type and screen. Will request a GI consult. Currently she is hemodynamically stable. Active Problems: Anemia associated with acute blood loss: Likely related to #1. Review indicates her baseline range appears to be 10.5-11.5. See therapy for problem #1.  Hypertension: Controlled. Currently systolic blood pressure range 578-469. Will hold her home Lasix for now. We'll continue her home beta blocker.    Atrial fibrillation: Rate controlled. Will get updated EKG. Will continue her home beta blocker. Will hold her home Pradaxa.     Chronic kidney disease: Stage III. Creatinine of 1.6 appears to be close to baseline according to chart review. We'll very gently hydrate and support with IV fluids. Will monitor closely.    Chronic respiratory failure: Appears stable at baseline. Oxygen saturation level greater than 90% on her home Oxygen level of 2 L.    Thrombocytopenia: Appears somewhat chronic. Mild. Not require intervention at this time.  Diabetes: Patient on 12 units of Lantus at home. Will provide Lantus at half the dose and use sliding scale insulin for optimal glycemic control. Will check her hemoglobin A1c.     Anemia of other chronic disease: See problem #2    CHF (congestive heart failure): History of chronic diastolic heart failure. Will hold her home Lasix for now. Will monitor strict intake and output. Will obtain daily weights. Echo done in June of 2014 yields an ejection fraction of 55-60%.  Diabetic ulcer of lower extremity: Chronic particularly on the left medial aspect of her shin. Will request wound consult.  Gastroenterology  Code Status: DO NOT RESUSCITATE Family Communication: None available Disposition Plan: Back to facility when ready hopefully 24-36 hours  Time spent: 65 minutes  Gwenyth Bender [facility] Pager [phone]  If 7PM-7AM, please contact night-coverage [email or web address] Password [facility] 01/06/2013, 2:36 PM

## 2013-01-06 NOTE — Progress Notes (Signed)
Skin Assessment verified with Catherine Ravel, RN.  Pt has scattered open ulcers to hips, thighs, legs, and upper arms.  Scant serous drainage noted, foam dressings applied.  Several red scabbed areas across body.  Sacrum red, but blanchable. Assist patient PRN with turning.  Skin is also very dry with some cracking to lower extremities.

## 2013-01-06 NOTE — H&P (Signed)
Patient seen and examined. Agree with note as per Toya Smothers, NP.  Patient has been admitted with rectal bleeding. She is anticoagulated on Pradaxa for atrial fibrillation. We will hold anticoagulation at this time. Follow serial CBCs. Hemoglobin is currently near baseline she does not require transfusion yet. Her blood pressure is borderline I will hold diltiazem. Continue Lopressor for now. Hold Lasix. GI has been consulted. Case was discussed with Dr. Karilyn Cota who will see the patient in the morning. He has requested that stool for C. difficile be sent.  Patient will be admitted to the service of Dr. Ouida Sills, [facility] will be available for any issues until 7 AM on 9/11.  MEMON,JEHANZEB

## 2013-01-06 NOTE — ED Provider Notes (Signed)
CSN: [id number]     Arrival date & time 01/06/13  1048 History  This chart was scribed for Ward Givens, MD by Quintella Reichert, ED scribe.  This patient was seen in room APA06/APA06 and the patient's care was started at 11:25 AM.    Chief Complaint  Patient presents with  . Rectal Bleeding    The history is provided by the patient. No language interpreter was used.    HPI Comments: Catherine [name] is a 79 y.o. female with h/o DM, CHF, breast cancer, anemia, HTN, and COPD (on oxygen constantly) who presents to the Emergency Department complaining of persistent rectal bleeding that began 2 days ago.  Pt states that she has been seeing bright red blood with every bowel movement, both in the toilet and on wiping.  She has been having multiple BMs daily.  She states that she had hemorrhoid surgery in the 1960s and is concerned that she may have hemorrhoids again, although she denies any painful or swollen areas at the rectum.  She states that she has otherwise been well and denies abdominal pain, nausea, vomiting, weakness, dizziness, or any other associated symptoms.  Pt lives at [facility] subsequent to right ankle surgery for a diabetic ulcer one year ago and states she plans to return home when she has finished her recovery.  She does not smoke or drink.  Surgical history includes hysterectomy and cholecystectomy. She currently has some ulcers on her LLE.   PCP is Dr. Ouida Sills   Past Medical History  Diagnosis Date  . CHF (congestive heart failure)   . Diabetes mellitus   . Hypertension   . COPD (chronic obstructive pulmonary disease)   . Vertigo   . Cancer     breast with mastectomy  . Stroke      patient denies  . Skin disorder      Epidermolysis bullosa.  . Pulmonary hypertension   . Atrial fibrillation 2011  . Multiple thyroid nodules   . Anemia   . Breast cancer   . Anemia of other chronic disease 06/09/2012  . Aortic stenosis   . Monoclonal gammopathy   .  Hypothermia   . Renal disorder     CKD  . Diverticulosis     Past Surgical History  Procedure Laterality Date  . Cholecystectomy    . Hemorrhoid surgery    . Abdominal hysterectomy  1991    complete hysterectomy for cancer  . Mastectomy  2005    left breast, tamoxifin  . Goiter    . Hip fracture surgery  2010    right hip replacement  . Hip fracture surgery  2006    left hip replacement  . Colonoscopy  ?    remote  . Cataract extraction, bilateral      Family History  Problem Relation Age of Onset  . Colon cancer Neg Hx   . Heart attack Son     age 49s, suspected MI  . Diabetes Mother     History  Substance Use Topics  . Smoking status: Never Smoker   . Smokeless tobacco: Never Used  . Alcohol Use: No  living in [facility]  OB History   Grav Para Term Preterm Abortions TAB SAB Ect Mult Living                  Review of Systems  Gastrointestinal: Positive for blood in stool. Negative for nausea, vomiting and rectal pain.  Neurological: Negative for  dizziness and weakness.  All other systems reviewed and are negative.    Allergies  Amoxicillin-pot clavulanate  Home Medications   No current outpatient prescriptions on file. BP 113/81  Pulse 88  Temp(Src) 97.7 F (36.5 C) (Oral)  Resp 20  Ht 5\' 4"  (1.626 m)  Wt 139 lb (63.05 kg)  BMI 23.85 kg/m2  SpO2 98%  Vital signs normal    Physical Exam  Nursing note and vitals reviewed. Constitutional: She is oriented to person, place, and time. She appears well-nourished.  Non-toxic appearance. She does not appear ill. No distress.  Thin elderly female  HENT:  Head: Normocephalic and atraumatic.  Right Ear: External ear normal.  Left Ear: External ear normal.  Nose: Nose normal. No mucosal edema or rhinorrhea.  Mouth/Throat: Oropharynx is clear and moist and mucous membranes are normal. No dental abscesses or edematous.  Eyes: EOM are normal. Pupils are equal, round, and reactive to light.  Conjunctival  pallor  Neck: Normal range of motion and full passive range of motion without pain. Neck supple.  Cardiovascular: Normal rate and regular rhythm.  Exam reveals no gallop and no friction rub.   Murmur heard.  Systolic murmur is present  Mid-systolic murmur, heard best at left upper sternal border  Pulmonary/Chest: Effort normal and breath sounds normal. No respiratory distress. She has no wheezes. She has no rhonchi. She has no rales. She exhibits no tenderness and no crepitus.  Abdominal: Soft. Normal appearance and bowel sounds are normal. She exhibits no distension. There is no tenderness. There is no rebound and no guarding.  Genitourinary:  Rectal exam (chaperone present): Old hemorrhoid tags, nothing acute-appearing. Blood on diaper. Yellow-brown stool.  Musculoskeletal: Normal range of motion. She exhibits no edema and no tenderness.  Left lower medial leg: 2 superficial ulcerated areas, one 2.5-cm and the other 1.5-cm, clean bases, no signs of surrounding infection Moves all extremities well.   Neurological: She is alert and oriented to person, place, and time. She has normal strength. No cranial nerve deficit.  Skin: Skin is warm, dry and intact. No rash noted. No erythema. No pallor.  Psychiatric: She has a normal mood and affect. Her speech is normal and behavior is normal. Her mood appears not anxious.    ED Course  Procedures (including critical care time)  DIAGNOSTIC STUDIES: Oxygen Saturation is 98% on [place], normal by my interpretation.    COORDINATION OF CARE: 11:37 AM-Discussed treatment plan which includes labs and stool sample with pt at bedside and pt agreed to plan.   Pt had no rectal bleeding while in the ED.   Pt is on anticoagulation (pradaxa) with rectal bleeding. Will need admission if just for observation to make sure the bleeding has stopped.   14:01 Dr Kerry Hough, admit to tele, Dr Ouida Sills, team 1  Results for orders placed during the hospital encounter of  01/06/13  CBC WITH DIFFERENTIAL      Result Value Range   WBC 4.0  4.0 - 10.5 K/uL   RBC 3.29 (*) 3.87 - 5.11 MIL/uL   Hemoglobin 10.8 (*) 12.0 - 15.0 g/dL   HCT 16.1 (*) 09.6 - 04.5 %   MCV 101.5 (*) 78.0 - 100.0 fL   MCH 32.8  26.0 - 34.0 pg   MCHC 32.3  30.0 - 36.0 g/dL   RDW 40.9 (*) 81.1 - 91.4 %   Platelets 139 (*) 150 - 400 K/uL   Neutrophils Relative % 72  43 - 77 %  Neutro Abs 2.9  1.7 - 7.7 K/uL   Lymphocytes Relative 13  12 - 46 %   Lymphs Abs 0.5 (*) 0.7 - 4.0 K/uL   Monocytes Relative 10  3 - 12 %   Monocytes Absolute 0.4  0.1 - 1.0 K/uL   Eosinophils Relative 5  0 - 5 %   Eosinophils Absolute 0.2  0.0 - 0.7 K/uL   Basophils Relative 1  0 - 1 %   Basophils Absolute 0.0  0.0 - 0.1 K/uL  COMPREHENSIVE METABOLIC PANEL      Result Value Range   Sodium 139  135 - 145 mEq/L   Potassium 4.0  3.5 - 5.1 mEq/L   Chloride 101  96 - 112 mEq/L   CO2 27  19 - 32 mEq/L   Glucose, Bld 136 (*) 70 - 99 mg/dL   BUN 57 (*) 6 - 23 mg/dL   Creatinine, Ser 4.54 (*) 0.50 - 1.10 mg/dL   Calcium 9.5  8.4 - 09.8 mg/dL   Total Protein 7.3  6.0 - 8.3 g/dL   Albumin 3.7  3.5 - 5.2 g/dL   AST 23  0 - 37 U/L   ALT 12  0 - 35 U/L   Alkaline Phosphatase 114  39 - 117 U/L   Total Bilirubin 0.9  0.3 - 1.2 mg/dL   GFR calc non Af Amer 27 (*) >90 mL/min   GFR calc Af Amer 31 (*) >90 mL/min  APTT      Result Value Range   aPTT 60 (*) 24 - 37 seconds  PROTIME-INR      Result Value Range   Prothrombin Time 21.4 (*) 11.6 - 15.2 seconds   INR 1.92 (*) 0.00 - 1.49  GLUCOSE, CAPILLARY      Result Value Range   Glucose-Capillary 93  70 - 99 mg/dL   Comment 1 Notify RN     Comment 2 Documented in Chart    OCCULT BLOOD, POC DEVICE      Result Value Range   Fecal Occult Bld POSITIVE (*) NEGATIVE  SAMPLE TO BLOOD BANK      Result Value Range   Blood Bank Specimen SAMPLE AVAILABLE FOR TESTING     Sample Expiration 01/09/2013     Laboratory interpretation all normal except stable chronic  anemia, stable renal insufficiency with slight elevation of her BUN which could be consistent with GI bleeding, elevation of her INR on pradaxa, +hemoccult    MDM   1. Rectal bleeding     Plan admission  Devoria Albe, MD, FACEP    I personally performed the services described in this documentation, which was scribed in my presence. The recorded information has been reviewed and considered.  Devoria Albe, MD, Armando Gang    Ward Givens, MD 01/06/13 [phone]

## 2013-01-06 NOTE — ED Notes (Addendum)
Pt is a resident at [facility], she reports rectal bleeding for 2 days -she sees it w/ every bowel movement and stated it bright red.  Denies any nausea or vomiting, no diarrhea or fever. No dizziness or cp.

## 2013-01-07 DIAGNOSIS — K625 Hemorrhage of anus and rectum: Secondary | ICD-10-CM

## 2013-01-07 DIAGNOSIS — K5730 Diverticulosis of large intestine without perforation or abscess without bleeding: Secondary | ICD-10-CM

## 2013-01-07 LAB — CBC
Hemoglobin: 10.3 g/dL — ABNORMAL LOW (ref 12.0–15.0)
MCHC: 31.9 g/dL (ref 30.0–36.0)
Platelets: 129 10*3/uL — ABNORMAL LOW (ref 150–400)

## 2013-01-07 LAB — BASIC METABOLIC PANEL
BUN: 50 mg/dL — ABNORMAL HIGH (ref 6–23)
GFR calc Af Amer: 35 mL/min — ABNORMAL LOW (ref >90)
GFR calc non Af Amer: 30 mL/min — ABNORMAL LOW (ref >90)
Potassium: 3.3 mEq/L — ABNORMAL LOW (ref 3.5–5.1)
Sodium: 141 mEq/L (ref 135–145)

## 2013-01-07 LAB — GLUCOSE, CAPILLARY
Glucose-Capillary: 106 mg/dL — ABNORMAL HIGH (ref 70–99)
Glucose-Capillary: 187 mg/dL — ABNORMAL HIGH (ref 70–99)
Glucose-Capillary: 233 mg/dL — ABNORMAL HIGH (ref 70–99)

## 2013-01-07 LAB — CLOSTRIDIUM DIFFICILE BY PCR: Toxigenic C. Difficile by PCR: NEGATIVE

## 2013-01-07 MED ORDER — POTASSIUM CHLORIDE CRYS ER 20 MEQ PO TBCR
20.0000 meq | EXTENDED_RELEASE_TABLET | Freq: Three times a day (TID) | ORAL | Status: DC
  Administered 2013-01-07 (×3): 20 meq via ORAL
  Filled 2013-01-07 (×4): qty 1

## 2013-01-07 MED ORDER — DILTIAZEM HCL ER COATED BEADS 240 MG PO CP24
240.0000 mg | ORAL_CAPSULE | Freq: Every day | ORAL | Status: DC
  Administered 2013-01-07 – 2013-01-08 (×2): 240 mg via ORAL
  Filled 2013-01-07 (×2): qty 1

## 2013-01-07 MED ORDER — FUROSEMIDE 40 MG PO TABS
40.0000 mg | ORAL_TABLET | Freq: Two times a day (BID) | ORAL | Status: DC
  Administered 2013-01-07 – 2013-01-08 (×3): 40 mg via ORAL
  Filled 2013-01-07 (×3): qty 1

## 2013-01-07 NOTE — Progress Notes (Signed)
NAME:  Catherine [name], Catherine [name]                   ACCOUNT NO.:  [id number]  MEDICAL RECORD NO.:  [id number]  LOCATION:  A341                          FACILITY:  [facility]  PHYSICIAN:  Kingsley Callander. Ouida Sills, MD       DATE OF BIRTH:  1923-07-15  DATE OF PROCEDURE: DATE OF DISCHARGE:                                PROGRESS NOTE   SUBJECTIVE:  [name] was admitted by the hospitalist yesterday for bright red rectal bleeding.  She has been anticoagulated with Pradaxa for AFib.  She had undergone a colonoscopy at [facility] last year.  She has a history of hemorrhoids.  She does not recall removal of any polyps.  She denies abdominal pain.  OBJECTIVE:  VITAL SIGNS:  Temperature 97.7, pulse 97, blood pressure 119/76. LUNGS:  Clear. HEART:  Irregular with a grade 2 systolic murmur. ABDOMEN:  Soft, nondistended, and nontender with no palpable organomegaly.  IMPRESSION/PLAN: 1. Rectal bleeding.  Consult GI.  We will try to obtain her     colonoscopy report from [facility].  Hemoglobin is relatively stable at     10.3 this morning.  She was 10.8 on admission yesterday and 10.6     yesterday evening.  She denies having bowel movement since     yesterday morning.  She reports having some bright red rectal     bleeding for 2-3 days prior to admission and some intermittent mild     episodes over the last several weeks.  Continue holding Pradaxa. 2. Mild leukopenia at 3.1. 3. Thrombocytopenia of 129,000. 4. Chronic diastolic heart failure, stable. 5. Pulmonary hypertension.  Continue oxygen. 6. Chronic kidney disease, stable with a BUN and creatinine of 50 and     1.5. 7. Diabetes.  Glucose this morning is 73 on her metabolic profile.     Hemoglobin A1c is 6.9. 8. Hypokalemia.  Serum potassium has dropped from 4 to 3.3, and will     be replaced orally. 9. Monoclonal gammopathy of uncertain significance, treated with     Procrit through the [facility]ology [facility].     Kingsley Callander. Ouida Sills, MD     ROF/MEDQ  D:  01/07/2013  T:   01/07/2013  Job:  [id number]

## 2013-01-07 NOTE — Clinical Social Work Psychosocial (Signed)
Clinical Social Work Department BRIEF PSYCHOSOCIAL ASSESSMENT 01/07/2013  Patient:  Catherine [name], Catherine [name]     Account Number:  [id number]     Admit date:  01/06/2013  Clinical Social Worker:  Nancie Neas  Date/Time:  01/07/2013 11:30 AM  Referred by:  Physician  Date Referred:  01/07/2013 Referred for  ALF Placement   Other Referral:   Interview type:  Patient Other interview type:    PSYCHOSOCIAL DATA Living Status:  FACILITY Admitted from facility:  [facility] Level of care:  Assisted Living Primary support name:  Nash Dimmer Primary support relationship to patient:  CHILD, ADULT Degree of support available:   supportive per pt    CURRENT CONCERNS Current Concerns  Post-Acute Placement   Other Concerns:    SOCIAL WORK ASSESSMENT / PLAN CSW met with pt at bedside. Pt alert and oriented and well known to CSW from previous admissions. She has been a resident at [facility] since January of this year. Pt had an extended stay at [facility] prior to going to [facility]. She has two step-sons whom she describes as involved and supportive and check on her regularly.  Pt began experiencing rectal bleeding and was brought to ED. Per Lupita Leash at facility, pt uses a walker while ambulating and is on 2 liters oxygen. She is fairly independent with ADLs and okay to return at d/c.   Assessment/plan status:  Psychosocial Support/Ongoing Assessment of Needs Other assessment/ plan:   Information/referral to community resources:   [place]    PATIENT'S/FAMILY'S RESPONSE TO PLAN OF CARE: Pt reports positive feelings regarding return to [facility] when medically stable. CSW will continue to follow and assist with d/c as needed.       Derenda Fennel, [place] [phone]

## 2013-01-07 NOTE — Consult Note (Signed)
Reason for Consult:Bright red rectal bleeding Referring Physician: Ouida Sills.  Catherine [name] is an 79 y.o. female.  HPI: Admitted thru the ED yesterday with c/o bright red rectal bleeding. She is presently a resident of [facility]. She tells me for 2-3 days before admission she had bright red blood on the toilet tissue and in her stool.  She tells me her stools were black.  She tells she has a hx of anemia and is presently taking iron. Her stools are usually black.  She had hemorrhoid surgery back in the 60s.   She had a colonoscopy last year at [facility] and she thinks it was normal. Her appetite is good.  She takes Pradaxa for atrial afib which is on hold at this time.    Past Medical History  Diagnosis Date  . CHF (congestive heart failure)   . Diabetes mellitus   . Hypertension   . COPD (chronic obstructive pulmonary disease)   . Vertigo   . Cancer     breast with mastectomy  . Stroke      patient denies  . Skin disorder      Epidermolysis bullosa.  . Pulmonary hypertension   . Atrial fibrillation 2011  . Multiple thyroid nodules   . Anemia   . Breast cancer   . Anemia of other chronic disease 06/09/2012  . Aortic stenosis   . Monoclonal gammopathy   . Hypothermia   . Renal disorder     CKD  . Diverticulosis     Past Surgical History  Procedure Laterality Date  . Cholecystectomy    . Hemorrhoid surgery    . Abdominal hysterectomy  1991    complete hysterectomy for cancer  . Mastectomy  2005    left breast, tamoxifin  . Goiter    . Hip fracture surgery  2010    right hip replacement  . Hip fracture surgery  2006    left hip replacement  . Colonoscopy  ?    remote  . Cataract extraction, bilateral      Family History  Problem Relation Age of Onset  . Colon cancer Neg Hx   . Heart attack Son     age 58s, suspected MI  . Diabetes Mother     Social History:  reports that she has never smoked. She has never used smokeless tobacco. She reports that she  does not drink alcohol or use illicit drugs.  Allergies:  Allergies  Allergen Reactions  . Amoxicillin-Pot Clavulanate Diarrhea    Medications: I have reviewed the patient's current medications.  Results for orders placed during the hospital encounter of 01/06/13 (from the past 48 hour(s))  OCCULT BLOOD, POC DEVICE     Status: Abnormal   Collection Time    01/06/13 11:50 AM      Result Value Range   Fecal Occult Bld POSITIVE (*) NEGATIVE  CBC WITH DIFFERENTIAL     Status: Abnormal   Collection Time    01/06/13 11:51 AM      Result Value Range   WBC 4.0  4.0 - 10.5 K/uL   RBC 3.29 (*) 3.87 - 5.11 MIL/uL   Hemoglobin 10.8 (*) 12.0 - 15.0 g/dL   HCT 16.1 (*) 09.6 - 04.5 %   MCV 101.5 (*) 78.0 - 100.0 fL   MCH 32.8  26.0 - 34.0 pg   MCHC 32.3  30.0 - 36.0 g/dL   RDW 40.9 (*) 81.1 - 91.4 %   Platelets 139 (*)  150 - 400 K/uL   Neutrophils Relative % 72  43 - 77 %   Neutro Abs 2.9  1.7 - 7.7 K/uL   Lymphocytes Relative 13  12 - 46 %   Lymphs Abs 0.5 (*) 0.7 - 4.0 K/uL   Monocytes Relative 10  3 - 12 %   Monocytes Absolute 0.4  0.1 - 1.0 K/uL   Eosinophils Relative 5  0 - 5 %   Eosinophils Absolute 0.2  0.0 - 0.7 K/uL   Basophils Relative 1  0 - 1 %   Basophils Absolute 0.0  0.0 - 0.1 K/uL  COMPREHENSIVE METABOLIC PANEL     Status: Abnormal   Collection Time    01/06/13 11:51 AM      Result Value Range   Sodium 139  135 - 145 mEq/L   Potassium 4.0  3.5 - 5.1 mEq/L   Chloride 101  96 - 112 mEq/L   CO2 27  19 - 32 mEq/L   Glucose, Bld 136 (*) 70 - 99 mg/dL   BUN 57 (*) 6 - 23 mg/dL   Creatinine, Ser 4.09 (*) 0.50 - 1.10 mg/dL   Calcium 9.5  8.4 - 81.1 mg/dL   Total Protein 7.3  6.0 - 8.3 g/dL   Albumin 3.7  3.5 - 5.2 g/dL   AST 23  0 - 37 U/L   ALT 12  0 - 35 U/L   Alkaline Phosphatase 114  39 - 117 U/L   Total Bilirubin 0.9  0.3 - 1.2 mg/dL   GFR calc non Af Amer 27 (*) >90 mL/min   GFR calc Af Amer 31 (*) >90 mL/min   Comment: (NOTE)     The eGFR has been  calculated using the CKD EPI equation.     This calculation has not been validated in all clinical situations.     eGFR's persistently <90 mL/min signify possible Chronic Kidney     Disease.  APTT     Status: Abnormal   Collection Time    01/06/13 11:51 AM      Result Value Range   aPTT 60 (*) 24 - 37 seconds   Comment:            IF BASELINE aPTT IS ELEVATED,     SUGGEST PATIENT RISK ASSESSMENT     BE USED TO DETERMINE APPROPRIATE     ANTICOAGULANT THERAPY.  PROTIME-INR     Status: Abnormal   Collection Time    01/06/13 11:51 AM      Result Value Range   Prothrombin Time 21.4 (*) 11.6 - 15.2 seconds   INR 1.92 (*) 0.00 - 1.49  SAMPLE TO BLOOD BANK     Status: None   Collection Time    01/06/13 11:51 AM      Result Value Range   Blood Bank Specimen SAMPLE AVAILABLE FOR TESTING     Sample Expiration 01/09/2013    TYPE AND SCREEN     Status: None   Collection Time    01/06/13 11:51 AM      Result Value Range   ABO/RH(D) O POS     Antibody Screen NEG     Sample Expiration 01/09/2013    HEMOGLOBIN A1C     Status: Abnormal   Collection Time    01/06/13 11:51 AM      Result Value Range   Hemoglobin A1C 6.9 (*) <5.7 %   Comment: (NOTE)  According to the ADA Clinical Practice Recommendations for 2011, when     HbA1c is used as a screening test:      >=6.5%   Diagnostic of Diabetes Mellitus               (if abnormal result is confirmed)     5.7-6.4%   Increased risk of developing Diabetes Mellitus     References:Diagnosis and Classification of Diabetes Mellitus,Diabetes     Care,2011,34(Suppl 1):S62-S69 and Standards of Medical Care in             Diabetes - 2011,Diabetes Care,2011,34 (Suppl 1):S11-S61.   Mean Plasma Glucose 151 (*) <117 mg/dL   Comment: Performed at [facility]  MRSA PCR SCREENING     Status: None   Collection Time    01/06/13  3:37 PM      Result Value Range   MRSA by  PCR NEGATIVE  NEGATIVE   Comment:            The GeneXpert MRSA Assay (FDA     approved for NASAL specimens     only), is one component of a     comprehensive MRSA colonization     surveillance program. It is not     intended to diagnose MRSA     infection nor to guide or     monitor treatment for     MRSA infections.  GLUCOSE, CAPILLARY     Status: None   Collection Time    01/06/13  4:20 PM      Result Value Range   Glucose-Capillary 93  70 - 99 mg/dL   Comment 1 Notify RN     Comment 2 Documented in Chart    CBC     Status: Abnormal   Collection Time    01/06/13  6:53 PM      Result Value Range   WBC 3.9 (*) 4.0 - 10.5 K/uL   RBC 3.23 (*) 3.87 - 5.11 MIL/uL   Hemoglobin 10.6 (*) 12.0 - 15.0 g/dL   HCT 16.1 (*) 09.6 - 04.5 %   MCV 101.9 (*) 78.0 - 100.0 fL   MCH 32.8  26.0 - 34.0 pg   MCHC 32.2  30.0 - 36.0 g/dL   RDW 40.9  81.1 - 91.4 %   Platelets 124 (*) 150 - 400 K/uL  GLUCOSE, CAPILLARY     Status: Abnormal   Collection Time    01/06/13  9:15 PM      Result Value Range   Glucose-Capillary 134 (*) 70 - 99 mg/dL   Comment 1 Notify RN     Comment 2 Documented in Chart    BASIC METABOLIC PANEL     Status: Abnormal   Collection Time    01/07/13  6:11 AM      Result Value Range   Sodium 141  135 - 145 mEq/L   Potassium 3.3 (*) 3.5 - 5.1 mEq/L   Comment: DELTA CHECK NOTED   Chloride 105  96 - 112 mEq/L   CO2 27  19 - 32 mEq/L   Glucose, Bld 73  70 - 99 mg/dL   BUN 50 (*) 6 - 23 mg/dL   Creatinine, Ser 7.82 (*) 0.50 - 1.10 mg/dL   Calcium 9.0  8.4 - 95.6 mg/dL   GFR calc non Af Amer 30 (*) >90 mL/min   GFR calc Af Amer 35 (*) >90 mL/min   Comment: (NOTE)     The eGFR has  been calculated using the CKD EPI equation.     This calculation has not been validated in all clinical situations.     eGFR's persistently <90 mL/min signify possible Chronic Kidney     Disease.  CBC     Status: Abnormal   Collection Time    01/07/13  6:11 AM      Result Value Range   WBC  3.1 (*) 4.0 - 10.5 K/uL   RBC 3.16 (*) 3.87 - 5.11 MIL/uL   Hemoglobin 10.3 (*) 12.0 - 15.0 g/dL   HCT 40.9 (*) 81.1 - 91.4 %   MCV 102.2 (*) 78.0 - 100.0 fL   MCH 32.6  26.0 - 34.0 pg   MCHC 31.9  30.0 - 36.0 g/dL   RDW 78.2 (*) 95.6 - 21.3 %   Platelets 129 (*) 150 - 400 K/uL  GLUCOSE, CAPILLARY     Status: None   Collection Time    01/07/13  7:57 AM      Result Value Range   Glucose-Capillary 78  70 - 99 mg/dL   Comment 1 Notify RN     Comment 2 Documented in Chart      No results found.  ROS Blood pressure 119/76, pulse 97, temperature 97.4 F (36.3 C), temperature source Oral, resp. rate 18, height 5\' 4"  (1.626 m), weight 139 lb 5.3 oz (63.2 kg), SpO2 94.00%. Physical Exam Alert and oriented. Skin warm and dry. Oral mucosa is moist.   . Sclera anicteric, conjunctivae is pink. Thyroid not enlarged. No cervical lymphadenopathy. Lungs clear. Heart regular rate and rhythm. Murmur heard.  Abdomen is soft. Bowel sounds are positive. No hepatomegaly. No abdominal masses felt. No tenderness.  No edema to lower extremities. Multiple dressing to lower extremities for "water blisters". Assessment/Plan: Rectal bleed/anemia. Possible diverticular bleed. She is maintaining her hemoglobin at this time. I have asked the Secretary on 3rd floor to call [facility] and see if we cannot locate the colonoscopy report. Will discuss with Dr. Karilyn Cota.  SETZER,TERRI W 01/07/2013, 8:42 AM    GI attending note; Patient interviewed and examined; Unable to access patient's record at [facility] via epic. Patient experience painless large volume hematochezia yesterday. She has not passed any more blood per rectum. She denies abdominal pain. Hemoglobin was 10.8 g on admission and this morning 10.3. Her abdominal examination is within normal limits. She had EGD at [facility] on 12/23/2011. She also had colonoscopy at [facility] on 12/23/2011. According to records provided by Dr. Ouida Sills patient did have snare polypectomy. We have  requested procedure reports but have not received them yet. Suspect colonic diverticula bleed in the setting of anticoagulation. Unless she has re\re bleed she does not need further evaluation. She will have repeat CBC in a.m. Pradaxa can be resumed early next week.

## 2013-01-07 NOTE — Evaluation (Signed)
Physical Therapy Evaluation Patient Details Name: Catherine [name] MRN: [id number] DOB: 08/14/1923 Today's Date: 01/07/2013 Time:  - 13:38-14:14 36 minutes  PT Assessment / Plan / Recommendation History of Present Illness   Pt is admitted with rectal bleeding.  She is a resident of [facility] and is fairly independent with personal ADLs.  She ambulates with a walker for functional distances and uses a w/c for longer distances.  Clinical Impression  Pt was seen for evaluation and found to be at baseline functional level.  Her gait with a walker is stable, she is pain free and feeling well.  She had been receiving  HHPT but had just about finished her course of treatment and she does not feel that she needs any further PT.  I agree with her.     PT Assessment    No further PT needed   Follow Up Recommendations   none    Does the patient have the potential to tolerate intense rehabilitation    no  Barriers to Discharge  none      Equipment Recommendations    none   Recommendations for Other Services   none  Frequency      Precautions / Restrictions  none  Pertinent Vitals/Pain       Mobility    pt is able to transfer sit to stand independently, stand to sit independently    Pt ambulates with a walker for 94' with a RW and modified independence  Exercises  none   PT Diagnosis:    PT Problem List:   PT Treatment Interventions:       PT Goals(Current goals can be found in the care plan section)    Visit Information          Prior Functioning   Ambulates with a walker at [facility], assistance needed with bathing    Cognition    alert and oriented   Extremity/Trunk Assessment   [facility]  Balance  WFL  End of Session  up in chair with chair alarm on and call bell with pt.  GP     Myrlene Broker L 01/07/2013, 8:31 AM  Pt is

## 2013-01-08 LAB — BASIC METABOLIC PANEL
Calcium: 9.4 mg/dL (ref 8.4–10.5)
GFR calc non Af Amer: 27 mL/min — ABNORMAL LOW (ref >90)
Potassium: 3.4 mEq/L — ABNORMAL LOW (ref 3.5–5.1)
Sodium: 142 mEq/L (ref 135–145)

## 2013-01-08 LAB — GLUCOSE, CAPILLARY: Glucose-Capillary: 99 mg/dL (ref 70–99)

## 2013-01-08 LAB — CBC
MCH: 32.4 pg (ref 26.0–34.0)
MCHC: 31.9 g/dL (ref 30.0–36.0)
Platelets: 120 10*3/uL — ABNORMAL LOW (ref 150–400)
RBC: 3.21 MIL/uL — ABNORMAL LOW (ref 3.87–5.11)

## 2013-01-08 MED ORDER — POTASSIUM CHLORIDE CRYS ER 20 MEQ PO TBCR
40.0000 meq | EXTENDED_RELEASE_TABLET | Freq: Once | ORAL | Status: AC
  Administered 2013-01-08: 40 meq via ORAL
  Filled 2013-01-08: qty 2

## 2013-01-08 NOTE — Clinical Social Work Note (Signed)
Pt d/c today back to [facility]. Pt and facility aware and agreeable and facility will provide transport. Per MD, follow medications on AVS as pt is to have Miralax which was not on d/c summary.   Derenda Fennel, [place] [phone]

## 2013-01-08 NOTE — Discharge Summary (Signed)
NAME:  Catherine [name], Catherine [name]                   ACCOUNT NO.:  [id number]  MEDICAL RECORD NO.:  [id number]  LOCATION:                                 FACILITY:  PHYSICIAN:  Oneta Sigman O. Jayren Cease, MD       DATE OF BIRTH:  06/12/1923  DATE OF ADMISSION:  01/06/2013 DATE OF DISCHARGE:  09/12/2014LH                              DISCHARGE SUMMARY   DISCHARGE DIAGNOSES: 1. Lower gastrointestinal bleed. 2. Monoclonal gammopathy of uncertain significance with chronic     anemia. 3. Hypokalemia. 4. Chronic kidney disease. 5. Type 2 diabetes. 6. Chronic atrial fibrillation. 7. Pulmonary hypertension. 8. Chronic diastolic heart failure. 9. History of breast cancer. 10.Epidermolysis bullosa. 11.Aortic stenosis.  DISCHARGE MEDICATIONS: 1. Acetaminophen 650 mg q.6 hours p.r.n. 2. ProAir 2 puffs q.4 p.r.n. 3. Atorvastatin 10 mg daily. 4. Vitamin D 2000 units daily. 5. Celexa 20 mg daily. 6. Klonopin 0.5 mg t.i.d. p.r.n. 7. Diltiazem CD 240 mg daily. 8. Ferrous sulfate 325 mg daily. 9. Lidex cream 0.05% apply b.i.d. 10.Lasix 40 mg b.i.d. 11.Lantus 12 units daily. 12.Claritin 10 mg daily. 13.Metoprolol 50 mg b.i.d. 14.Remeron 7.5 mg at bedtime. 15.Protonix 40 mg daily. 16.Potassium 20 mEq b.i.d. 17.Kenalog 0.1% cream daily.  HOSPITAL COURSE:  This patient is an 79-year-old female who presented with bright red rectal bleeding.  She was anticoagulated with Pradaxa for chronic AFib.  Her hemoglobin was initially 11.6.  She has a history of chronic anemia associated with monoclonal gammopathy of uncertain significance.  She has been treated with Procrit but has not received it but has not required it for several months.  She is followed in Hematology clinic.  She had been hospitalized at [facility] last year and had colonoscopy and upper endoscopy at that time.  She was hospitalized and observed.  She remained hemodynamically stable. Her hemoglobin trended down to 10.3 on the 11th.  Bleeding stopped. Pradaxa  was held.  She was seen in Gastroenterology consultation by Dr. Rehman who has not felt that colonoscopy was required again.  She was felt to have bleeding likely secondary to diverticulosis, by the 12th she had not had bleeding for 2 days, and had a stable hemoglobin at 10.4.  She was improved and stable for discharge back to [facility].  She remains hemodynamically stable.  Her AFib rate has been well controlled with metoprolol and diltiazem.  Pradaxa is being held until her followup visit next week.  She will likely be switched to Eliquis at that time. Her chronic kidney disease has remained stable.  BUN and creatinine are 45 and 1.6 on discharge.  She is mildly hypokalemic at 3.4 and will be supplemented with additional potassium doses.  Her chronic diastolic heart failure, has remained stable on Lasix 40 mg twice a day.  As noted her condition at discharge has much improved.  FOLLOWUP:  The patient will be seen in the office in 1 week.  Pradaxa will be held.     [name], MD     ROF/MEDQ  D:  01/08/2013  T:  01/08/2013  Job:  [id number] 

## 2013-01-08 NOTE — Progress Notes (Signed)
UR Chart Review Completed  

## 2013-01-08 NOTE — Discharge Summary (Deleted)
NAME:  Catherine [name], Catherine [name]                   ACCOUNT NO.:  [id number]  MEDICAL RECORD NO.:  [id number]  LOCATION:                                 FACILITY:  PHYSICIAN:  Kingsley Callander. Ouida Sills, MD       DATE OF BIRTH:  06/13/1923  DATE OF ADMISSION:  01/06/2013 DATE OF DISCHARGE:  09/12/2014LH                              DISCHARGE SUMMARY   DISCHARGE DIAGNOSES: 1. Lower gastrointestinal bleed. 2. Monoclonal gammopathy of uncertain significance with chronic     anemia. 3. Hypokalemia. 4. Chronic kidney disease. 5. Type 2 diabetes. 6. Chronic atrial fibrillation. 7. Pulmonary hypertension. 8. Chronic diastolic heart failure. 9. History of breast cancer. 10.Epidermolysis bullosa. 11.Aortic stenosis.  DISCHARGE MEDICATIONS: 1. Acetaminophen 650 mg q.6 hours p.r.n. 2. ProAir 2 puffs q.4 p.r.n. 3. Atorvastatin 10 mg daily. 4. Vitamin D 2000 units daily. 5. Celexa 20 mg daily. 6. Klonopin 0.5 mg t.i.d. p.r.n. 7. Diltiazem CD 240 mg daily. 8. Ferrous sulfate 325 mg daily. 9. Lidex cream 0.05% apply b.i.d. 10.Lasix 40 mg b.i.d. 11.Lantus 12 units daily. 12.Claritin 10 mg daily. 13.Metoprolol 50 mg b.i.d. 14.Remeron 7.5 mg at bedtime. 15.Protonix 40 mg daily. 16.Potassium 20 mEq b.i.d. 17.Kenalog 0.1% cream daily.  HOSPITAL COURSE:  This patient is an 79 year old female who presented with bright red rectal bleeding.  She was anticoagulated with Pradaxa for chronic AFib.  Her hemoglobin was initially 11.6.  She has a history of chronic anemia associated with monoclonal gammopathy of uncertain significance.  She has been treated with Procrit but has not received it but has not required it for several months.  She is followed in Hematology clinic.  She had been hospitalized at [facility] last year and had colonoscopy and upper endoscopy at that time.  She was hospitalized and observed.  She remained hemodynamically stable. Her hemoglobin trended down to 10.3 on the 11th.  Bleeding stopped. Pradaxa  was held.  She was seen in Gastroenterology consultation by Dr. Karilyn Cota who has not felt that colonoscopy was required again.  She was felt to have bleeding likely secondary to diverticulosis, by the 12th she had not had bleeding for 2 days, and had a stable hemoglobin at 10.4.  She was improved and stable for discharge back to [facility].  She remains hemodynamically stable.  Her AFib rate has been well controlled with metoprolol and diltiazem.  Pradaxa is being held until her followup visit next week.  She will likely be switched to Eliquis at that time. Her chronic kidney disease has remained stable.  BUN and creatinine are 45 and 1.6 on discharge.  She is mildly hypokalemic at 3.4 and will be supplemented with additional potassium doses.  Her chronic diastolic heart failure, has remained stable on Lasix 40 mg twice a day.  As noted her condition at discharge has much improved.  FOLLOWUP:  The patient will be seen in the office in 1 week.  Pradaxa will be held.     Kingsley Callander. Ouida Sills, MD     ROF/MEDQ  D:  01/08/2013  T:  01/08/2013  Job:  [id number]

## 2013-01-08 NOTE — Progress Notes (Signed)
Pt discharged back to [facility] per Dr. Ouida Sills. Pt's IV site D/C'd and WNL. Pt's VS stable at this time. Caregiver provided with home medication list, discharge instructions, and FL2. Verbalized understanding. Pt left floor via WC in stable condition accompanied by NT.

## 2013-01-12 ENCOUNTER — Encounter (HOSPITAL_BASED_OUTPATIENT_CLINIC_OR_DEPARTMENT_OTHER): Payer: Medicare Other

## 2013-01-12 VITALS — BP 96/64 | HR 86 | Temp 96.6°F | Resp 18 | Wt 136.3 lb

## 2013-01-12 DIAGNOSIS — D638 Anemia in other chronic diseases classified elsewhere: Secondary | ICD-10-CM

## 2013-01-12 DIAGNOSIS — D472 Monoclonal gammopathy: Secondary | ICD-10-CM

## 2013-01-12 DIAGNOSIS — K625 Hemorrhage of anus and rectum: Secondary | ICD-10-CM | POA: Insufficient documentation

## 2013-01-12 DIAGNOSIS — D62 Acute posthemorrhagic anemia: Secondary | ICD-10-CM

## 2013-01-12 LAB — CBC
HCT: 33.5 % — ABNORMAL LOW (ref 36.0–46.0)
MCHC: 32.2 g/dL (ref 30.0–36.0)
MCV: 101.8 fL — ABNORMAL HIGH (ref 78.0–100.0)
RDW: 15.4 % (ref 11.5–15.5)
WBC: 4.1 10*3/uL (ref 4.0–10.5)

## 2013-01-12 LAB — COMPREHENSIVE METABOLIC PANEL
Albumin: 3.7 g/dL (ref 3.5–5.2)
BUN: 54 mg/dL — ABNORMAL HIGH (ref 6–23)
Chloride: 102 mEq/L (ref 96–112)
Creatinine, Ser: 1.73 mg/dL — ABNORMAL HIGH (ref 0.50–1.10)
GFR calc non Af Amer: 25 mL/min — ABNORMAL LOW (ref >90)
Total Bilirubin: 0.9 mg/dL (ref 0.3–1.2)

## 2013-01-12 LAB — FERRITIN: Ferritin: 168 ng/mL (ref 10–291)

## 2013-01-12 LAB — LACTATE DEHYDROGENASE: LDH: 154 U/L (ref 94–250)

## 2013-01-12 MED ORDER — DARBEPOETIN ALFA-POLYSORBATE 200 MCG/0.4ML IJ SOLN
200.0000 ug | Freq: Once | INTRAMUSCULAR | Status: AC
  Administered 2013-01-12: 200 ug via SUBCUTANEOUS

## 2013-01-12 MED ORDER — DARBEPOETIN ALFA-POLYSORBATE 200 MCG/0.4ML IJ SOLN
INTRAMUSCULAR | Status: AC
  Filled 2013-01-12: qty 0.4

## 2013-01-12 NOTE — Progress Notes (Signed)
Catherine [name] presents today for injection per the provider's orders.  Aranesp administered administration without incident; see MAR for injection details.  Patient tolerated procedure well and without incident.  No questions or complaints noted at this time.

## 2013-01-12 NOTE — Patient Instructions (Signed)
.  [facility] Discharge Instructions  RECOMMENDATIONS MADE BY THE CONSULTANT AND ANY TEST RESULTS WILL BE SENT TO YOUR REFERRING PHYSICIAN.  EXAM FINDINGS BY THE PHYSICIAN TODAY AND SIGNS OR SYMPTOMS TO REPORT TO CLINIC OR PRIMARY PHYSICIAN: Exam today by Dr. Zigmund Daniel  INSTRUCTIONS GIVEN AND DISCUSSED: Labs today and aranesp pending cbc  SPECIAL INSTRUCTIONS/FOLLOW-UP: Return in 2 weeks  Thank you for choosing [name] to provide your oncology and hematology care.  To afford each patient quality time with our providers, please arrive at least 15 minutes before your scheduled appointment time.  With your help, our goal is to use those 15 minutes to complete the necessary work-up to ensure our physicians have the information they need to help with your evaluation and healthcare recommendations.    Effective January 1st, 2014, we ask that you re-schedule your appointment with our physicians should you arrive 10 or more minutes late for your appointment.  We strive to give you quality time with our providers, and arriving late affects you and other patients whose appointments are after yours.    Again, thank you for choosing [facility].  Our hope is that these requests will decrease the amount of time that you wait before being seen by our physicians.       _____________________________________________________________  Should you have questions after your visit to [facility], please contact our office at [phone] between the hours of 8:30 a.m. and 5:00 p.m.  Voicemails left after 4:30 p.m. will not be returned until the following business day.  For prescription refill requests, have your pharmacy contact our office with your prescription refill request.

## 2013-01-12 NOTE — Progress Notes (Signed)
[facility] OFFICE PROGRESS [name], MD [address] [place] [address]  DIAGNOSIS: Bleeding from anus  Anemia of other chronic disease  Anemia associated with acute blood loss  MGUS (monoclonal gammopathy of unknown significance)  CURRENT THERAPY:  INTERVAL HISTORY: Catherine [name] 79 y.o. female returns for folllowup after recent hospitalization for ectal bleeding not requiring transfusion, Pradaxa stopped.   Appetite good with no further bleeding after 3 day hospitalization. Mininal LE swelling without chest pain, shortness of breath, fever, diarrhea, cough, PND, orthopnea but with occasional palpitations.  No skin rash, headache, incontinence.  Continues to live at [facility]. No sigmoidoscopy performed.  MEDICAL HISTORY: Past Medical History  Diagnosis Date  . CHF (congestive heart failure)   . Diabetes mellitus   . Hypertension   . COPD (chronic obstructive pulmonary disease)   . Vertigo   . Cancer     breast with mastectomy  . Stroke      patient denies  . Skin disorder      Epidermolysis bullosa.  . Pulmonary hypertension   . Atrial fibrillation 2011  . Multiple thyroid nodules   . Anemia   . Breast cancer   . Anemia of other chronic disease 06/09/2012  . Aortic stenosis   . Monoclonal gammopathy   . Hypothermia   . Renal disorder     CKD  . Diverticulosis     INTERIM HISTORY: has Anemia of other chronic disease; CHF (congestive heart failure); Community acquired pneumonia; Hypertension; Atrial fibrillation; Chronic kidney disease; Chronic cor pulmonale; Fever, unspecified; Pleural effusion; Nausea with vomiting; Chronic respiratory failure; Thrombocytopenia; BRBPR (bright red blood per rectum); Anemia associated with acute blood loss; Diabetic ulcer of lower extremity; MGUS (monoclonal gammopathy of unknown significance); Neurodermatitis; Diverticulosis; and Diabetes on her problem list.    ALLERGIES:  is allergic to  amoxicillin-pot clavulanate.  MEDICATIONS: has a current medication list which includes the following prescription(s): acetaminophen, albuterol, atorvastatin, vitamin d, citalopram, clonazepam, cyanocobalamin, diltiazem, ferrous sulfate, fluocinonide cream, furosemide, lantus solostar, loratadine, metoprolol, mirtazapine, pantoprazole, polyethylene glycol, potassium chloride sa, and triamcinolone cream.  SURGICAL HISTORY:  Past Surgical History  Procedure Laterality Date  . Cholecystectomy    . Hemorrhoid surgery    . Abdominal hysterectomy  1991    complete hysterectomy for cancer  . Mastectomy  2005    left breast, tamoxifin  . Goiter    . Hip fracture surgery  2010    right hip replacement  . Hip fracture surgery  2006    left hip replacement  . Colonoscopy  ?    remote  . Cataract extraction, bilateral      Filed Vitals:   01/12/13 1004  BP: 96/64  Pulse: 86  Temp: 96.6 F (35.9 C)  Resp: 18    REVIEW OF SYSTEMS:   Constitutional: Denies fevers, chills or abnormal weight loss Eyes: Denies blurriness of vision Ears, nose, mouth, throat, and face: Denies mucositis or sore throat Respiratory: Denies cough, dyspnea or wheezes Cardiovascular: Rare palpitations, chest discomfort or lower extremity swelling.  Gastrointestinal:  Denies nausea, heartburn or change in bowel habits. Recent rectal bleeding while on Pradaxa, etiology undertermined. Skin: Denies abnormal skin rashes Lymphatics: Denies new lymphadenopathy or easy bruising Neurological:Denies numbness, tingling or new weaknesses Behavioral/Psych: Mood is stable, no new changes  All other systems were reviewed with the patient and are negative.  PHYSICAL EXAMINATION: ECOG PERFORMANCE STATUS: 2 - Symptomatic, <50% confined to bed  GENERAL:alert, no distress and comfortable SKIN: skin color, texture, turgor are normal, no rashes or significant lesions EYES: normal, Conjunctiva are pink and non-injected, sclera  clear OROPHARYNX:no exudate, no erythema and lips, buccal mucosa, and tongue normal  NECK: supple, thyroid normal size, non-tender, without nodularity LYMPH:  no palpable lymphadenopathy in the cervical, axillary or inguinal LUNGS: clear to auscultation and percussion with normal breathing effort HEART: regular rate & rhythm and no murmurs and no lower extremity edema ABDOMEN:abdomen soft, non-tender and normal bowel sounds Musculoskeletal:no cyanosis of digits and no clubbing  NEURO: alert & oriented x 3 with fluent speech, no focal motor/sensory deficits    ASSESSMENT:Number 1 MGUS diagnosed in [place] #2 history of left-sided breast cancer 2005 treated postoperatively with 5 years of adjuvant hormonal therapy thus far without obvious recurrence #3 CHF #4 atrial fibrillation on pradaxa #5 diabetes mellitus #6 osteoporosis #7 neurodermatitis #8 right lower leg skin cancer the past #9 epidermal lysis bullosa on a steroid cream #10 pulmonary hypertension     PLAN: 1. Aranesp Ricketts if Hgb <11.0 and Hct<33. 2. Hold anticoagulation for now. 3. Repeat MGUS workup and review in 2 weeks when seen again with CBC that day.   All questions were answered. The patient knows to call the clinic with any problems, questions or concerns. We can certainly see the patient much sooner if necessary.   Alla German A  I spent 30 minutes counseling the patient face to face. The total time spent in the appointment was 40 minutes.      Maurilio Lovely, MD 01/12/2013 10:32 AM

## 2013-01-13 LAB — KAPPA/LAMBDA LIGHT CHAINS
Kappa free light chain: 6.05 mg/dL — ABNORMAL HIGH (ref 0.33–1.94)
Lambda free light chains: 3.48 mg/dL — ABNORMAL HIGH (ref 0.57–2.63)

## 2013-01-13 LAB — BETA 2 MICROGLOBULIN, SERUM: Beta-2 Microglobulin: 5.66 mg/L — ABNORMAL HIGH (ref 1.01–1.73)

## 2013-01-13 LAB — IGG, IGA, IGM
IgA: 204 mg/dL (ref 69–380)
IgG (Immunoglobin G), Serum: 1370 mg/dL (ref 690–1700)

## 2013-01-14 LAB — PROTEIN ELECTROPHORESIS, SERUM
Alpha-2-Globulin: 10.5 % (ref 7.1–11.8)
M-Spike, %: NOT DETECTED g/dL
Total Protein ELP: 7 g/dL (ref 6.0–8.3)

## 2013-01-14 LAB — IMMUNOFIXATION ELECTROPHORESIS: IgA: 202 mg/dL (ref 69–380)

## 2013-01-26 ENCOUNTER — Ambulatory Visit (HOSPITAL_COMMUNITY): Payer: Medicare Other

## 2013-02-02 ENCOUNTER — Encounter (HOSPITAL_COMMUNITY): Payer: Medicare Other | Attending: Oncology

## 2013-02-02 ENCOUNTER — Encounter (HOSPITAL_COMMUNITY): Payer: Self-pay

## 2013-02-02 ENCOUNTER — Ambulatory Visit (HOSPITAL_COMMUNITY): Payer: Medicare Other

## 2013-02-02 ENCOUNTER — Encounter (HOSPITAL_BASED_OUTPATIENT_CLINIC_OR_DEPARTMENT_OTHER): Payer: Medicare Other

## 2013-02-02 VITALS — BP 110/68 | HR 71 | Temp 96.9°F | Resp 20 | Wt 150.1 lb

## 2013-02-02 DIAGNOSIS — D638 Anemia in other chronic diseases classified elsewhere: Secondary | ICD-10-CM | POA: Insufficient documentation

## 2013-02-02 DIAGNOSIS — D472 Monoclonal gammopathy: Secondary | ICD-10-CM

## 2013-02-02 DIAGNOSIS — N189 Chronic kidney disease, unspecified: Secondary | ICD-10-CM

## 2013-02-02 DIAGNOSIS — E119 Type 2 diabetes mellitus without complications: Secondary | ICD-10-CM

## 2013-02-02 DIAGNOSIS — I4891 Unspecified atrial fibrillation: Secondary | ICD-10-CM

## 2013-02-02 DIAGNOSIS — Z853 Personal history of malignant neoplasm of breast: Secondary | ICD-10-CM

## 2013-02-02 LAB — CBC
Hemoglobin: 12.3 g/dL (ref 12.0–15.0)
MCHC: 32.4 g/dL (ref 30.0–36.0)
Platelets: 149 10*3/uL — ABNORMAL LOW (ref 150–400)
RBC: 3.64 MIL/uL — ABNORMAL LOW (ref 3.87–5.11)

## 2013-02-02 NOTE — Patient Instructions (Addendum)
[facility] Discharge Instructions  RECOMMENDATIONS MADE BY THE CONSULTANT AND ANY TEST RESULTS WILL BE SENT TO YOUR REFERRING PHYSICIAN.  EXAM FINDINGS BY THE PHYSICIAN TODAY AND SIGNS OR SYMPTOMS TO REPORT TO CLINIC OR PRIMARY PHYSICIAN: Exam and findings as discussed by Dr. Zigmund Daniel.  Your hemoglobin is 12.3 so you do not need the aranesp today.  Report increased fatigue or other problems.  MEDICATIONS PRESCRIBED:  none  INSTRUCTIONS/FOLLOW-UP: Return every 3 weeks for labs and possible injection and to be seen in follow-up in 3 months.  Thank you for choosing [facility] to provide your oncology and hematology care.  To afford each patient quality time with our providers, please arrive at least 15 minutes before your scheduled appointment time.  With your help, our goal is to use those 15 minutes to complete the necessary work-up to ensure our physicians have the information they need to help with your evaluation and healthcare recommendations.    Effective January 1st, 2014, we ask that you re-schedule your appointment with our physicians should you arrive 10 or more minutes late for your appointment.  We strive to give you quality time with our providers, and arriving late affects you and other patients whose appointments are after yours.    Again, thank you for choosing [facility].  Our hope is that these requests will decrease the amount of time that you wait before being seen by our physicians.       _____________________________________________________________  Should you have questions after your visit to [facility], please contact our office at [phone] between the hours of 8:30 a.m. and 5:00 p.m.  Voicemails left after 4:30 p.m. will not be returned until the following business day.  For prescription refill requests, have your pharmacy contact our office with your prescription refill request.

## 2013-02-02 NOTE — Progress Notes (Signed)
Labs drawn today for cbc 

## 2013-02-02 NOTE — Progress Notes (Signed)
[facility] OFFICE PROGRESS [name], MD [address] [place] [address]  DIAGNOSIS: Anemia of other chronic disease  MGUS (monoclonal gammopathy of unknown significance)  Chronic kidney disease  Atrial fibrillation  Chief Complaint  Patient presents with  . Anemia    CURRENT THERAPY: Aranesp every 3 weeks if hemoglobin less than 11.  INTERVAL HISTORY: Catherine [name] 79 y.o. female returns for followup of anemia of chronic disease in association with MGUS. She did receive Aranesp 2 weeks ago and continues to do well at [place]. Appetite is good with no fever, night sweats, PND, orthopnea, palpitations, headache, but still hard of hearing. No nausea, vomiting, diarrhea, constipation, dysuria, hematuria, lower extremity swelling or redness, chest pain, palpitations, or seizures.   MEDICAL HISTORY: Past Medical History  Diagnosis Date  . CHF (congestive heart failure)   . Diabetes mellitus   . Hypertension   . COPD (chronic obstructive pulmonary disease)   . Vertigo   . Cancer     breast with mastectomy  . Stroke      patient denies  . Skin disorder      Epidermolysis bullosa.  . Pulmonary hypertension   . Atrial fibrillation 2011  . Multiple thyroid nodules   . Anemia   . Breast cancer   . Anemia of other chronic disease 06/09/2012  . Aortic stenosis   . Monoclonal gammopathy   . Hypothermia   . Renal disorder     CKD  . Diverticulosis     INTERIM HISTORY: has Anemia of other chronic disease; CHF (congestive heart failure); Community acquired pneumonia; Hypertension; Atrial fibrillation; Chronic kidney disease; Chronic cor pulmonale; Fever, unspecified; Pleural effusion; Nausea with vomiting; Chronic respiratory failure; Thrombocytopenia; BRBPR (bright red blood per rectum); Anemia associated with acute blood loss; Diabetic ulcer of lower extremity; MGUS (monoclonal gammopathy of unknown significance); Neurodermatitis;  Diverticulosis; and Diabetes on her problem list.    ALLERGIES:  is allergic to amoxicillin-pot clavulanate.  MEDICATIONS: has a current medication list which includes the following prescription(s): acetaminophen, albuterol, apixaban, atorvastatin, vitamin d, citalopram, clonazepam, cyanocobalamin, diltiazem, ferrous sulfate, fluocinonide cream, furosemide, lantus solostar, loratadine, metoprolol, mirtazapine, pantoprazole, polyethylene glycol, potassium chloride sa, and triamcinolone cream.  SURGICAL HISTORY:  Past Surgical History  Procedure Laterality Date  . Cholecystectomy    . Hemorrhoid surgery    . Abdominal hysterectomy  1991    complete hysterectomy for cancer  . Mastectomy  2005    left breast, tamoxifin  . Goiter    . Hip fracture surgery  2010    right hip replacement  . Hip fracture surgery  2006    left hip replacement  . Colonoscopy  ?    remote  . Cataract extraction, bilateral      FAMILY HISTORY: family history includes Diabetes in her mother; Heart attack in her son. There is no history of Colon cancer.  SOCIAL HISTORY:  reports that she has never smoked. She has never used smokeless tobacco. She reports that she does not drink alcohol or use illicit drugs.  REVIEW OF SYSTEMS:  Other than that discussed above is noncontributory.  PHYSICAL EXAMINATION: ECOG PERFORMANCE STATUS: 2 - Symptomatic, <50% confined to bed  Blood pressure 110/68, pulse 71, temperature 96.9 F (36.1 C), temperature source Oral, resp. rate 20, weight 150 lb 1.6 oz (68.085 kg).  GENERAL:alert, no distress and comfortable SKIN: skin color, texture, turgor are normal, no rashes or significant lesions EYES: PERLA; Conjunctiva are  pink and non-injected, sclera clear OROPHARYNX:no exudate, no erythema on lips, buccal mucosa, or tongue. NECK: supple, thyroid normal size, non-tender, without nodularity. No masses CHEST: Normal AP diameter with no breast masses. LYMPH:  no palpable  lymphadenopathy in the cervical, axillary or inguinal LUNGS: clear to auscultation and percussion with normal breathing effort HEART: regular rate & rhythm and no murmurs and no lower extremity edema ABDOMEN:abdomen soft, non-tender and normal bowel sounds MUSCULOSKELETAL:no cyanosis of digits and no clubbing. Range of motion normal.  NEURO: alert & oriented x 3 with fluent speech, no focal motor/sensory deficits   LABORATORY DATA: Infusion on 02/02/2013  Component Date Value Range Status  . WBC 02/02/2013 4.9  4.0 - 10.5 K/uL Final  . RBC 02/02/2013 3.64* 3.87 - 5.11 MIL/uL Final  . Hemoglobin 02/02/2013 12.3  12.0 - 15.0 g/dL Final  . HCT 30/86/5784 38.0  36.0 - 46.0 % Final  . MCV 02/02/2013 104.4* 78.0 - 100.0 fL Final  . MCH 02/02/2013 33.8  26.0 - 34.0 pg Final  . MCHC 02/02/2013 32.4  30.0 - 36.0 g/dL Final  . RDW 69/62/9528 16.2* 11.5 - 15.5 % Final  . Platelets 02/02/2013 149* 150 - 400 K/uL Final  Office Visit on 01/12/2013  Component Date Value Range Status  . WBC 01/12/2013 4.1  4.0 - 10.5 K/uL Final  . RBC 01/12/2013 3.29* 3.87 - 5.11 MIL/uL Final  . Hemoglobin 01/12/2013 10.8* 12.0 - 15.0 g/dL Final  . HCT 41/32/4401 33.5* 36.0 - 46.0 % Final  . MCV 01/12/2013 101.8* 78.0 - 100.0 fL Final  . MCH 01/12/2013 32.8  26.0 - 34.0 pg Final  . MCHC 01/12/2013 32.2  30.0 - 36.0 g/dL Final  . RDW 02/72/5366 15.4  11.5 - 15.5 % Final  . Platelets 01/12/2013 136* 150 - 400 K/uL Final  . Sodium 01/12/2013 139  135 - 145 mEq/L Final  . Potassium 01/12/2013 4.2  3.5 - 5.1 mEq/L Final  . Chloride 01/12/2013 102  96 - 112 mEq/L Final  . CO2 01/12/2013 28  19 - 32 mEq/L Final  . Glucose, Bld 01/12/2013 151* 70 - 99 mg/dL Final  . BUN 44/06/4740 54* 6 - 23 mg/dL Final  . Creatinine, Ser 01/12/2013 1.73* 0.50 - 1.10 mg/dL Final  . Calcium 59/56/3875 9.4  8.4 - 10.5 mg/dL Final  . Total Protein 01/12/2013 7.1  6.0 - 8.3 g/dL Final  . Albumin 64/33/2951 3.7  3.5 - 5.2 g/dL Final  .  AST 88/41/6606 18  0 - 37 U/L Final  . ALT 01/12/2013 8  0 - 35 U/L Final  . Alkaline Phosphatase 01/12/2013 103  39 - 117 U/L Final  . Total Bilirubin 01/12/2013 0.9  0.3 - 1.2 mg/dL Final  . GFR calc non Af Amer 01/12/2013 25* >90 mL/min Final  . GFR calc Af Amer 01/12/2013 29* >90 mL/min Final   Comment: (NOTE)                          The eGFR has been calculated using the CKD EPI equation.                          This calculation has not been validated in all clinical situations.                          eGFR's persistently <90 mL/min signify possible Chronic Kidney  Disease.  . Beta-2 Microglobulin 01/12/2013 5.66* 1.01 - 1.73 mg/L Final   Performed at [facility]  . Total Protein ELP 01/12/2013 7.0  6.0 - 8.3 g/dL Final  . Albumin ELP 47/82/9562 55.5* 55.8 - 66.1 % Final  . Alpha-1-Globulin 01/12/2013 5.5* 2.9 - 4.9 % Final  . Alpha-2-Globulin 01/12/2013 10.5  7.1 - 11.8 % Final  . Beta Globulin 01/12/2013 5.3  4.7 - 7.2 % Final  . Beta 2 01/12/2013 4.3  3.2 - 6.5 % Final  . Gamma Globulin 01/12/2013 18.9* 11.1 - 18.8 % Final  . M-Spike, % 01/12/2013 NOT DETECTED   Final  . SPE Interp. 01/12/2013 (NOTE)   Final   Comment: Nonspecific diffuse polyclonal type increase in gamma globulins.                          Results are consistent with SPE performed on 06/10/12                          Reviewed by Dallas Breeding, MD, PhD, FCAP (Electronic Signature on                          File)  . Comment 01/12/2013 (NOTE)   Final   Comment: ---------------                          Serum protein electrophoresis is a useful screening procedure in the                          detection of various pathophysiologic states such as inflammation,                          gammopathies, protein loss and other dysproteinemias.  Immunofixation                          electrophoresis (IFE) is a more sensitive technique for the                           identification of M-proteins found in patients with monoclonal                          gammopathy of unknown significance (MGUS), amyloidosis, early or                          treated myeloma or macroglobulinemia, solitary plasmacytoma or                          extramedullary plasmacytoma.                          Performed at [facility]  . Kappa free light chain 01/12/2013 6.05* 0.33 - 1.94 mg/dL Final  . Lamda free light chains 01/12/2013 3.48* 0.57 - 2.63 mg/dL Final  . Kappa, lamda light chain ratio 01/12/2013 1.74* 0.26 - 1.65 Final   Performed at [facility]  . IgG (Immunoglobin G), Serum 01/12/2013 1370  690 - 1700 mg/dL Final  . IgA 13/11/6576 204  69 - 380 mg/dL Final  .  IgM, Serum 01/12/2013 78  52 - 322 mg/dL Final   Performed at [facility]  . Total Protein ELP 01/12/2013 7.0  6.0 - 8.3 g/dL Final  . IgG (Immunoglobin G), Serum 01/12/2013 1440  690 - 1700 mg/dL Final  . IgA 40/98/1191 202  69 - 380 mg/dL Final  . IgM, Serum 47/82/9562 75  52 - 322 mg/dL Final  . Immunofix Electr Int 01/12/2013 (NOTE)   Final   Comment: No monoclonal protein identified.                          Reviewed by Dallas Breeding, MD, PhD, FCAP (Electronic Signature on                          File)                          Performed at [facility]  . Ferritin 01/12/2013 168  10 - 291 ng/mL Final   Performed at [facility]  . LDH 01/12/2013 154  94 - 250 U/L Final  Admission on 01/06/2013, Discharged on 01/08/2013  Component Date Value Range Status  . WBC 01/06/2013 4.0  4.0 - 10.5 K/uL Final  . RBC 01/06/2013 3.29* 3.87 - 5.11 MIL/uL Final  . Hemoglobin 01/06/2013 10.8* 12.0 - 15.0 g/dL Final  . HCT 13/11/6576 33.4* 36.0 - 46.0 % Final  . MCV 01/06/2013 101.5* 78.0 - 100.0 fL Final  . MCH 01/06/2013 32.8  26.0 - 34.0 pg Final  . MCHC 01/06/2013 32.3  30.0 - 36.0 g/dL Final  . RDW 46/96/2952 15.7* 11.5 - 15.5 % Final  . Platelets 01/06/2013  139* 150 - 400 K/uL Final  . Neutrophils Relative % 01/06/2013 72  43 - 77 % Final  . Neutro Abs 01/06/2013 2.9  1.7 - 7.7 K/uL Final  . Lymphocytes Relative 01/06/2013 13  12 - 46 % Final  . Lymphs Abs 01/06/2013 0.5* 0.7 - 4.0 K/uL Final  . Monocytes Relative 01/06/2013 10  3 - 12 % Final  . Monocytes Absolute 01/06/2013 0.4  0.1 - 1.0 K/uL Final  . Eosinophils Relative 01/06/2013 5  0 - 5 % Final  . Eosinophils Absolute 01/06/2013 0.2  0.0 - 0.7 K/uL Final  . Basophils Relative 01/06/2013 1  0 - 1 % Final  . Basophils Absolute 01/06/2013 0.0  0.0 - 0.1 K/uL Final  . Sodium 01/06/2013 139  135 - 145 mEq/L Final  . Potassium 01/06/2013 4.0  3.5 - 5.1 mEq/L Final  . Chloride 01/06/2013 101  96 - 112 mEq/L Final  . CO2 01/06/2013 27  19 - 32 mEq/L Final  . Glucose, Bld 01/06/2013 136* 70 - 99 mg/dL Final  . BUN 84/13/2440 57* 6 - 23 mg/dL Final  . Creatinine, Ser 01/06/2013 1.66* 0.50 - 1.10 mg/dL Final  . Calcium 02/23/2535 9.5  8.4 - 10.5 mg/dL Final  . Total Protein 01/06/2013 7.3  6.0 - 8.3 g/dL Final  . Albumin 64/40/3474 3.7  3.5 - 5.2 g/dL Final  . AST 25/95/6387 23  0 - 37 U/L Final  . ALT 01/06/2013 12  0 - 35 U/L Final  . Alkaline Phosphatase 01/06/2013 114  39 - 117 U/L Final  . Total Bilirubin 01/06/2013 0.9  0.3 - 1.2 mg/dL Final  . GFR calc non Af Amer 01/06/2013 27* >90 mL/min Final  .  GFR calc Af Amer 01/06/2013 31* >90 mL/min Final   Comment: (NOTE)                          The eGFR has been calculated using the CKD EPI equation.                          This calculation has not been validated in all clinical situations.                          eGFR's persistently <90 mL/min signify possible Chronic Kidney                          Disease.  Marland Kitchen aPTT 01/06/2013 60* 24 - 37 seconds Final   Comment:                                 IF BASELINE aPTT IS ELEVATED,                          SUGGEST PATIENT RISK ASSESSMENT                          BE USED TO DETERMINE  APPROPRIATE                          ANTICOAGULANT THERAPY.  . Prothrombin Time 01/06/2013 21.4* 11.6 - 15.2 seconds Final  . INR 01/06/2013 1.92* 0.00 - 1.49 Final  . Blood Bank Specimen 01/06/2013 SAMPLE AVAILABLE FOR TESTING   Final  . Sample Expiration 01/06/2013 01/09/2013   Final  . Fecal Occult Bld 01/06/2013 POSITIVE* NEGATIVE Final  . ABO/RH(D) 01/06/2013 O POS   Final  . Antibody Screen 01/06/2013 NEG   Final  . Sample Expiration 01/06/2013 01/09/2013   Final  . Hemoglobin A1C 01/06/2013 6.9* <5.7 % Final   Comment: (NOTE)                                                                                                                         According to the ADA Clinical Practice Recommendations for 2011, when                          HbA1c is used as a screening test:                           >=6.5%   Diagnostic of Diabetes Mellitus                                    (  if abnormal result is confirmed)                          5.7-6.4%   Increased risk of developing Diabetes Mellitus                          References:Diagnosis and Classification of Diabetes Mellitus,Diabetes                          Care,2011,34(Suppl 1):S62-S69 and Standards of Medical Care in                                  Diabetes - 2011,Diabetes Care,2011,34 (Suppl 1):S11-S61.  . Mean Plasma Glucose 01/06/2013 151* <117 mg/dL Final   Performed at [facility]  . WBC 01/06/2013 3.9* 4.0 - 10.5 K/uL Final  . RBC 01/06/2013 3.23* 3.87 - 5.11 MIL/uL Final  . Hemoglobin 01/06/2013 10.6* 12.0 - 15.0 g/dL Final  . HCT 16/01/9603 32.9* 36.0 - 46.0 % Final  . MCV 01/06/2013 101.9* 78.0 - 100.0 fL Final  . MCH 01/06/2013 32.8  26.0 - 34.0 pg Final  . MCHC 01/06/2013 32.2  30.0 - 36.0 g/dL Final  . RDW 54/12/8117 15.4  11.5 - 15.5 % Final  . Platelets 01/06/2013 124* 150 - 400 K/uL Final  . MRSA by PCR 01/06/2013 NEGATIVE  NEGATIVE Final   Comment:                                 The GeneXpert MRSA  Assay (FDA                          approved for NASAL specimens                          only), is one component of a                          comprehensive MRSA colonization                          surveillance program. It is not                          intended to diagnose MRSA                          infection nor to guide or                          monitor treatment for                          MRSA infections.  . Glucose-Capillary 01/06/2013 93  70 - 99 mg/dL Final  . Comment 1 14/78/2956 Notify RN   Final  . Comment 2 01/06/2013 Documented in Chart   Final  . Sodium 01/07/2013 141  135 - 145 mEq/L Final  . Potassium 01/07/2013 3.3* 3.5 - 5.1 mEq/L Final   DELTA CHECK NOTED  . Chloride 01/07/2013 105  96 -  112 mEq/L Final  . CO2 01/07/2013 27  19 - 32 mEq/L Final  . Glucose, Bld 01/07/2013 73  70 - 99 mg/dL Final  . BUN 11/91/4782 50* 6 - 23 mg/dL Final  . Creatinine, Ser 01/07/2013 1.50* 0.50 - 1.10 mg/dL Final  . Calcium 95/62/1308 9.0  8.4 - 10.5 mg/dL Final  . GFR calc non Af Amer 01/07/2013 30* >90 mL/min Final  . GFR calc Af Amer 01/07/2013 35* >90 mL/min Final   Comment: (NOTE)                          The eGFR has been calculated using the CKD EPI equation.                          This calculation has not been validated in all clinical situations.                          eGFR's persistently <90 mL/min signify possible Chronic Kidney                          Disease.  . WBC 01/07/2013 3.1* 4.0 - 10.5 K/uL Final  . RBC 01/07/2013 3.16* 3.87 - 5.11 MIL/uL Final  . Hemoglobin 01/07/2013 10.3* 12.0 - 15.0 g/dL Final  . HCT 65/78/4696 32.3* 36.0 - 46.0 % Final  . MCV 01/07/2013 102.2* 78.0 - 100.0 fL Final  . MCH 01/07/2013 32.6  26.0 - 34.0 pg Final  . MCHC 01/07/2013 31.9  30.0 - 36.0 g/dL Final  . RDW 29/52/8413 15.6* 11.5 - 15.5 % Final  . Platelets 01/07/2013 129* 150 - 400 K/uL Final  . C difficile by pcr 01/07/2013 NEGATIVE  NEGATIVE Final  .  Glucose-Capillary 01/06/2013 134* 70 - 99 mg/dL Final  . Comment 1 24/40/1027 Notify RN   Final  . Comment 2 01/06/2013 Documented in Chart   Final  . Glucose-Capillary 01/07/2013 78  70 - 99 mg/dL Final  . Comment 1 25/36/6440 Notify RN   Final  . Comment 2 01/07/2013 Documented in Chart   Final  . Glucose-Capillary 01/07/2013 106* 70 - 99 mg/dL Final  . Comment 1 34/74/2595 Notify RN   Final  . Comment 2 01/07/2013 Documented in Chart   Final  . Glucose-Capillary 01/07/2013 187* 70 - 99 mg/dL Final  . Comment 1 63/87/5643 Notify RN   Final  . Comment 2 01/07/2013 Documented in Chart   Final  . WBC 01/08/2013 3.2* 4.0 - 10.5 K/uL Final  . RBC 01/08/2013 3.21* 3.87 - 5.11 MIL/uL Final  . Hemoglobin 01/08/2013 10.4* 12.0 - 15.0 g/dL Final  . HCT 32/95/1884 32.6* 36.0 - 46.0 % Final  . MCV 01/08/2013 101.6* 78.0 - 100.0 fL Final  . MCH 01/08/2013 32.4  26.0 - 34.0 pg Final  . MCHC 01/08/2013 31.9  30.0 - 36.0 g/dL Final  . RDW 16/60/6301 15.5  11.5 - 15.5 % Final  . Platelets 01/08/2013 120* 150 - 400 K/uL Final  . Sodium 01/08/2013 142  135 - 145 mEq/L Final  . Potassium 01/08/2013 3.4* 3.5 - 5.1 mEq/L Final  . Chloride 01/08/2013 106  96 - 112 mEq/L Final  . CO2 01/08/2013 28  19 - 32 mEq/L Final  . Glucose, Bld 01/08/2013 132* 70 - 99 mg/dL Final  . BUN 60/01/9322 45* 6 - 23 mg/dL Final  .  Creatinine, Ser 01/08/2013 1.63* 0.50 - 1.10 mg/dL Final  . Calcium 45/40/9811 9.4  8.4 - 10.5 mg/dL Final  . GFR calc non Af Amer 01/08/2013 27* >90 mL/min Final  . GFR calc Af Amer 01/08/2013 32* >90 mL/min Final   Comment: (NOTE)                          The eGFR has been calculated using the CKD EPI equation.                          This calculation has not been validated in all clinical situations.                          eGFR's persistently <90 mL/min signify possible Chronic Kidney                          Disease.  . Glucose-Capillary 01/07/2013 233* 70 - 99 mg/dL Final  .  Glucose-Capillary 01/08/2013 99  70 - 99 mg/dL Final  . Glucose-Capillary 01/08/2013 180* 70 - 99 mg/dL Final    PATHOLOGY:  Urinalysis    Component Value Date/Time   COLORURINE YELLOW 12/05/2012 0855   APPEARANCEUR CLEAR 12/05/2012 0855   LABSPEC 1.020 12/05/2012 0855   PHURINE 5.5 12/05/2012 0855   GLUCOSEU NEGATIVE 12/05/2012 0855   HGBUR TRACE* 12/05/2012 0855   BILIRUBINUR NEGATIVE 12/05/2012 0855   KETONESUR NEGATIVE 12/05/2012 0855   PROTEINUR NEGATIVE 12/05/2012 0855   UROBILINOGEN 0.2 12/05/2012 0855   NITRITE NEGATIVE 12/05/2012 0855   LEUKOCYTESUR NEGATIVE 12/05/2012 0855    RADIOGRAPHIC STUDIES: No results found.  ASSESSMENT: #1. MGUS, stable with no evidence of progression to lymphoma, myeloma, or amyloidosis. #2. Left-sided breast cancer in 2005 treated postoperatively with 5 years of adjuvant hormone therapy, no evidence of disease. #3. Atrial fibrillation, stable. #4. Diabetes mellitus, non-insulin requiring, controlled. #5. Right lower extremity skin cancer in the past, no evidence of disease.   PLAN: #1. Aranesp every 3 weeks if hemoglobin is less than 11. Today's value 12.3 #2. May resume anticoagulation with Apixaban  if no further rectal bleeding.  #3. Followup with lab work in 3 months.   All questions were answered. The patient knows to call the clinic with any problems, questions or concerns. We can certainly see the patient much sooner if necessary.   I spent 20 minutes counseling the patient face to face. The total time spent in the appointment was 15 minutes.    Maurilio Lovely, MD 02/02/2013 12:28 PM

## 2013-02-10 ENCOUNTER — Other Ambulatory Visit (HOSPITAL_COMMUNITY): Payer: Self-pay | Admitting: Urology

## 2013-02-10 DIAGNOSIS — R338 Other retention of urine: Secondary | ICD-10-CM

## 2013-02-11 ENCOUNTER — Ambulatory Visit (HOSPITAL_COMMUNITY)
Admission: RE | Admit: 2013-02-11 | Discharge: 2013-02-11 | Disposition: A | Discharge status: Home / Self Care | Payer: Medicare Other | Source: Ambulatory Visit | Attending: Urology | Admitting: Urology

## 2013-02-11 DIAGNOSIS — R188 Other ascites: Secondary | ICD-10-CM | POA: Insufficient documentation

## 2013-02-11 DIAGNOSIS — R338 Other retention of urine: Secondary | ICD-10-CM

## 2013-02-11 DIAGNOSIS — R339 Retention of urine, unspecified: Secondary | ICD-10-CM | POA: Insufficient documentation

## 2013-02-23 ENCOUNTER — Encounter (HOSPITAL_BASED_OUTPATIENT_CLINIC_OR_DEPARTMENT_OTHER): Payer: Medicare Other

## 2013-02-23 VITALS — BP 114/59 | HR 77 | Temp 97.4°F | Resp 20

## 2013-02-23 DIAGNOSIS — D638 Anemia in other chronic diseases classified elsewhere: Secondary | ICD-10-CM

## 2013-02-23 DIAGNOSIS — M810 Age-related osteoporosis without current pathological fracture: Secondary | ICD-10-CM

## 2013-02-23 LAB — COMPREHENSIVE METABOLIC PANEL
ALT: 10 U/L (ref 0–35)
AST: 18 U/L (ref 0–37)
Albumin: 3.6 g/dL (ref 3.5–5.2)
CO2: 28 mEq/L (ref 19–32)
Calcium: 9.7 mg/dL (ref 8.4–10.5)
Creatinine, Ser: 2.07 mg/dL — ABNORMAL HIGH (ref 0.50–1.10)
GFR calc non Af Amer: 20 mL/min — ABNORMAL LOW (ref >90)
Total Protein: 7.5 g/dL (ref 6.0–8.3)

## 2013-02-23 LAB — CBC
MCH: 32.9 pg (ref 26.0–34.0)
MCHC: 31.7 g/dL (ref 30.0–36.0)
Platelets: 168 10*3/uL (ref 150–400)
RBC: 3.77 MIL/uL — ABNORMAL LOW (ref 3.87–5.11)

## 2013-02-23 MED ORDER — DENOSUMAB 60 MG/ML ~~LOC~~ SOLN
60.0000 mg | Freq: Once | SUBCUTANEOUS | Status: AC
  Administered 2013-02-23: 60 mg via SUBCUTANEOUS
  Filled 2013-02-23: qty 1

## 2013-02-23 NOTE — Progress Notes (Signed)
Labs drawn today for cbc 

## 2013-02-23 NOTE — Progress Notes (Signed)
prolia 6o mg given sub-q to lower left abd.  Tolerated well.

## 2013-02-24 ENCOUNTER — Other Ambulatory Visit (HOSPITAL_COMMUNITY): Payer: Self-pay | Admitting: Oncology

## 2013-02-24 ENCOUNTER — Emergency Department (HOSPITAL_COMMUNITY)
Admission: EM | Admit: 2013-02-24 | Discharge: 2013-02-24 | Disposition: A | Discharge status: Home / Self Care | Payer: Medicare Other | Attending: Emergency Medicine | Admitting: Emergency Medicine

## 2013-02-24 ENCOUNTER — Encounter (HOSPITAL_COMMUNITY): Payer: Self-pay | Admitting: Oncology

## 2013-02-24 ENCOUNTER — Encounter (HOSPITAL_COMMUNITY): Payer: Self-pay | Admitting: Emergency Medicine

## 2013-02-24 DIAGNOSIS — I129 Hypertensive chronic kidney disease with stage 1 through stage 4 chronic kidney disease, or unspecified chronic kidney disease: Secondary | ICD-10-CM | POA: Insufficient documentation

## 2013-02-24 DIAGNOSIS — R339 Retention of urine, unspecified: Secondary | ICD-10-CM | POA: Insufficient documentation

## 2013-02-24 DIAGNOSIS — Z853 Personal history of malignant neoplasm of breast: Secondary | ICD-10-CM | POA: Insufficient documentation

## 2013-02-24 DIAGNOSIS — Z8673 Personal history of transient ischemic attack (TIA), and cerebral infarction without residual deficits: Secondary | ICD-10-CM | POA: Insufficient documentation

## 2013-02-24 DIAGNOSIS — J4489 Other specified chronic obstructive pulmonary disease: Secondary | ICD-10-CM | POA: Insufficient documentation

## 2013-02-24 DIAGNOSIS — D638 Anemia in other chronic diseases classified elsewhere: Secondary | ICD-10-CM | POA: Insufficient documentation

## 2013-02-24 DIAGNOSIS — F0390 Unspecified dementia without behavioral disturbance: Secondary | ICD-10-CM | POA: Insufficient documentation

## 2013-02-24 DIAGNOSIS — Z8719 Personal history of other diseases of the digestive system: Secondary | ICD-10-CM | POA: Insufficient documentation

## 2013-02-24 DIAGNOSIS — Z872 Personal history of diseases of the skin and subcutaneous tissue: Secondary | ICD-10-CM | POA: Insufficient documentation

## 2013-02-24 DIAGNOSIS — I4891 Unspecified atrial fibrillation: Secondary | ICD-10-CM | POA: Insufficient documentation

## 2013-02-24 DIAGNOSIS — E119 Type 2 diabetes mellitus without complications: Secondary | ICD-10-CM | POA: Insufficient documentation

## 2013-02-24 DIAGNOSIS — M810 Age-related osteoporosis without current pathological fracture: Secondary | ICD-10-CM | POA: Insufficient documentation

## 2013-02-24 DIAGNOSIS — N189 Chronic kidney disease, unspecified: Secondary | ICD-10-CM | POA: Insufficient documentation

## 2013-02-24 DIAGNOSIS — N390 Urinary tract infection, site not specified: Secondary | ICD-10-CM

## 2013-02-24 DIAGNOSIS — J449 Chronic obstructive pulmonary disease, unspecified: Secondary | ICD-10-CM | POA: Insufficient documentation

## 2013-02-24 DIAGNOSIS — I509 Heart failure, unspecified: Secondary | ICD-10-CM | POA: Insufficient documentation

## 2013-02-24 DIAGNOSIS — Z79899 Other long term (current) drug therapy: Secondary | ICD-10-CM | POA: Insufficient documentation

## 2013-02-24 HISTORY — DX: Reserved for concepts with insufficient information to code with codable children: IMO0002

## 2013-02-24 LAB — URINALYSIS, ROUTINE W REFLEX MICROSCOPIC
Bilirubin Urine: NEGATIVE
Nitrite: NEGATIVE
Specific Gravity, Urine: 1.02 (ref 1.005–1.030)
Urobilinogen, UA: 0.2 mg/dL (ref 0.0–1.0)
pH: 5.5 (ref 5.0–8.0)

## 2013-02-24 MED ORDER — NITROFURANTOIN MONOHYD MACRO 100 MG PO CAPS
100.0000 mg | ORAL_CAPSULE | Freq: Two times a day (BID) | ORAL | Status: DC
  Administered 2013-02-24: 100 mg via ORAL
  Filled 2013-02-24: qty 1

## 2013-02-24 MED ORDER — NITROFURANTOIN MONOHYD MACRO 100 MG PO CAPS: 100.0000 mg | ORAL_CAPSULE | Freq: Two times a day (BID) | ORAL | Status: DC

## 2013-02-24 MED ORDER — CALCIUM 600 MG PO TABS: 1200.0000 mg | ORAL_TABLET | Freq: Every day | ORAL | Status: DC

## 2013-02-24 NOTE — ED Notes (Signed)
Leg bag applied to the patient RLE. Patient medicated with 1st dose of Marcobid prior to DC as Dr. Adriana Simas intended not at 2200 as ordered. -MD made aware [facility] transport on scene to transported the patient. Patient: -DC'd in no apparent distress with RR and effort WDL -wheeled out of the ED via wheelchair with transport companion at side -reported understanding DC instructions -stable vitals noted at DC

## 2013-02-24 NOTE — ED Notes (Signed)
Late Entry: 1525 Patient noted to be resting both eyes closed and in no apparent distress  1545 Patient reports: -her bottom was hurting her, indicating she thought there may be a wrinkle in the sheet causing the discomfort (it was investigated and no object nor wrinkles appreciated to the patient's bedding; the cool to touch towel was placed behind the patient to which she reported her appreciation in addition to indicating her bottom felt better) -she had not seen or heard from anyone in over an hour (patient was assured this RN was at the bedside to check on her, and found that she was resting and did not want to bother her) -she would like to know what is going on and if she was going to be kept or released  1600 MD Adriana Simas consulted in order to find out the " game plan". -Dr. Adriana Simas reported the plan is to place a leg bag on the patient and most likely DC her with urology follow up  1610 Patient made aware of the MD's intentions.

## 2013-02-24 NOTE — ED Provider Notes (Signed)
CSN: [id number]     Arrival date & time 02/24/13  1112 History  This chart was scribed for Donnetta Hutching, MD by Quintella Reichert, ED scribe.  This patient was seen in room APA03/APA03 and the patient's care was started at 1:35 PM.   Chief Complaint  Patient presents with  . abdominal distention     The history is provided by the patient. No language interpreter was used.    Level 5 Caveat: Mild Dementia  HPI Comments: Catherine [name] is a 79 y.o. female with h/o CHF, CKD, aortic stenosis, breast cancer, A-fib, COPD, DM and HTN sent by urologist to the Emergency Department for abdominal distension.  Pt was seen by her urologist (Dr. Jerre Simon) today for a routine appointment and was informed that her abdomen was distended.  She was told that "my bladder isn't emptying and it's twice as good as it should be."  Pt denies abdominal pain or pain to any other area.  She has been urinating normally today.  She states she is only here because she was sent by her urologist and she otherwise feels well.   Past Medical History  Diagnosis Date  . CHF (congestive heart failure)   . Diabetes mellitus   . Hypertension   . COPD (chronic obstructive pulmonary disease)   . Vertigo   . Cancer     breast with mastectomy  . Stroke      patient denies  . Skin disorder      Epidermolysis bullosa.  . Pulmonary hypertension   . Atrial fibrillation 2011  . Multiple thyroid nodules   . Anemia   . Breast cancer   . Anemia of other chronic disease 06/09/2012  . Aortic stenosis   . Monoclonal gammopathy   . Hypothermia   . Renal disorder     CKD  . Diverticulosis   . Squamous cell carcinoma     Past Surgical History  Procedure Laterality Date  . Cholecystectomy    . Hemorrhoid surgery    . Abdominal hysterectomy  1991    complete hysterectomy for cancer  . Mastectomy  2005    left breast, tamoxifin  . Goiter    . Hip fracture surgery  2010    right hip replacement  . Hip fracture surgery  2006     left hip replacement  . Colonoscopy  ?    remote  . Cataract extraction, bilateral      Family History  Problem Relation Age of Onset  . Colon cancer Neg Hx   . Heart attack Son     age 87s, suspected MI  . Diabetes Mother     History  Substance Use Topics  . Smoking status: Never Smoker   . Smokeless tobacco: Never Used  . Alcohol Use: No    OB History   Grav Para Term Preterm Abortions TAB SAB Ect Mult Living                  Review of Systems A complete 10 system review of systems was obtained and all systems are negative except as noted in the HPI and PMH.    Allergies  Amoxicillin-pot clavulanate  Home Medications   Current Outpatient Rx  Name  Route  Sig  Dispense  Refill  . acetaminophen (TYLENOL) 325 MG tablet   Oral   Take 650 mg by mouth every 6 (six) hours as needed for pain.         Marland Kitchen albuterol (PROAIR  HFA) 108 (90 BASE) MCG/ACT inhaler   Inhalation   Inhale 2 puffs into the lungs every 4 (four) hours as needed for wheezing.         Marland Kitchen apixaban (ELIQUIS) 2.5 MG TABS tablet   Oral   Take by mouth 2 (two) times daily.         Marland Kitchen atorvastatin (LIPITOR) 10 MG tablet   Oral   Take 10 mg by mouth at bedtime.         . Cholecalciferol (VITAMIN D) 2000 UNITS CAPS   Oral   Take 1 capsule by mouth every morning.           . citalopram (CELEXA) 20 MG tablet   Oral   Take 20 mg by mouth daily.         . clonazePAM (KLONOPIN) 0.5 MG tablet   Oral   Take 0.5 mg by mouth 3 (three) times daily as needed for anxiety (agitatuion).         . cyanocobalamin (,VITAMIN B-12,) 1000 MCG/ML injection   Intramuscular   Inject 1,000 mcg into the muscle every 30 (thirty) days.          Marland Kitchen diltiazem (CARDIZEM CD) 240 MG 24 hr capsule   Oral   Take 240 mg by mouth daily.           . ferrous sulfate 325 (65 FE) MG tablet   Oral   Take 325 mg by mouth daily with breakfast.         . fluocinonide cream (LIDEX) 0.05 %   Topical   Apply 1  application topically 2 (two) times daily. Apply sparingly to itchy areas on trunk and extremities.         . furosemide (LASIX) 40 MG tablet   Oral   Take 1 tablet (40 mg total) by mouth 2 (two) times daily.   60 tablet   12   . LANTUS SOLOSTAR 100 UNIT/ML injection   Subcutaneous   Inject 12 Units into the skin daily.          Marland Kitchen loratadine (CLARITIN) 10 MG tablet   Oral   Take 10 mg by mouth daily.         . metoprolol (LOPRESSOR) 50 MG tablet   Oral   Take 50 mg by mouth 2 (two) times daily.         . mirtazapine (REMERON) 15 MG tablet   Oral   Take 7.5 mg by mouth at bedtime.         . pantoprazole (PROTONIX) 40 MG tablet   Oral   Take 1 tablet (40 mg total) by mouth daily.   30 tablet   12   . polyethylene glycol (MIRALAX / GLYCOLAX) packet   Oral   Take 17 g by mouth daily as needed (constipation).         . potassium chloride SA (K-DUR,KLOR-CON) 20 MEQ tablet   Oral   Take 20 mEq by mouth 2 (two) times daily.         Marland Kitchen triamcinolone cream (KENALOG) 0.1 %   Topical   Apply 1 application topically daily. Apply to lower extremities once daily          BP 112/67  Pulse 65  Temp(Src) 97.5 F (36.4 C) (Oral)  Resp 20  Ht 5\' 2"  (1.575 m)  Wt 153 lb (69.4 kg)  BMI 27.98 kg/m2  SpO2 94%  Physical Exam  Nursing note and vitals reviewed.  Constitutional: She is oriented to person, place, and time. She appears well-developed and well-nourished.  HENT:  Head: Normocephalic and atraumatic.  Eyes: Conjunctivae and EOM are normal. Pupils are equal, round, and reactive to light.  Neck: Normal range of motion. Neck supple.  Cardiovascular: Normal rate, regular rhythm and normal heart sounds.   Pulmonary/Chest: Effort normal and breath sounds normal.  Abdominal: Soft. Bowel sounds are normal. She exhibits distension. There is no tenderness.  Lower abdomen protuberant  Musculoskeletal: Normal range of motion.  Neurological: She is alert and oriented  to person, place, and time.  Skin: Skin is warm and dry.  Psychiatric: She has a normal mood and affect.    ED Course  Procedures (including critical care time)  DIAGNOSTIC STUDIES: Oxygen Saturation is 94% on room air, adequate by my interpretation.    COORDINATION OF CARE: 1:39 PM-Discussed treatment plan which includes catheter placement and UA with pt at bedside and pt agreed to plan.    Labs Review Labs Reviewed  URINALYSIS, ROUTINE W REFLEX MICROSCOPIC - Abnormal; Notable for the following:    APPearance HAZY (*)    Hgb urine dipstick TRACE (*)    Leukocytes, UA TRACE (*)    All other components within normal limits  URINE MICROSCOPIC-ADD ON - Abnormal; Notable for the following:    Bacteria, UA MANY (*)    Casts HYALINE CASTS (*)    All other components within normal limits  URINE CULTURE    Imaging Review No results found.  EKG Interpretation   None       MDM  No diagnosis found. No acute abdomen. Large amount of urine drained via Foley catheter. Urinalysis shows minor infection.  Rx Macrobid.  We'll discharge home with catheter in place. Followup with urologist    I personally performed the services described in this documentation, which was scribed in my presence. The recorded information has been reviewed and is accurate.    Donnetta Hutching, MD 02/24/13 [phone]

## 2013-02-24 NOTE — ED Notes (Signed)
Pt was sent to ER from Dr. Rudean Haskell office for evaluation of abdominal distention.   Pt denies any pain.

## 2013-02-26 LAB — URINE CULTURE: Colony Count: >=100000

## 2013-02-27 NOTE — Progress Notes (Signed)
ED Antimicrobial Stewardship Positive Culture Follow Up   Catherine [name] is an 79 y.o. female who presented to [facility] on 02/24/2013 with a chief complaint of inability to void  Chief Complaint  Patient presents with  . abdominal distention     Recent Results (from the past 720 hour(s))  URINE CULTURE     Status: None   Collection Time    02/24/13  2:11 PM      Result Value Range Status   Specimen Description URINE, CATHETERIZED   Final   Special Requests NONE   Final   Culture  Setup Time     Final   Value: 02/25/2013 03:04     Performed at [facility]   Colony Count     Final   Value: >=100,000 COLONIES/ML     Performed at [facility]   Culture     Final   Value: KLEBSIELLA PNEUMONIAE     Performed at [facility]   Report Status 02/26/2013 FINAL   Final   Organism ID, Bacteria KLEBSIELLA PNEUMONIAE   Final    [x]  Treated with Macrobid, organism resistant to prescribed antimicrobial  New antibiotic prescription: D/c Macrobid and change to Keflex 500 mg bid x 7 days  ED Provider: Elpidio Anis, PA-C  Rolley Sims 02/27/2013, 1:28 PM Infectious Diseases Pharmacist Phone# [phone]

## 2013-02-28 ENCOUNTER — Telehealth (HOSPITAL_COMMUNITY): Payer: Self-pay | Admitting: Emergency Medicine

## 2013-02-28 NOTE — ED Notes (Signed)
Post ED Visit - Positive Culture Follow-up: Successful Patient Follow-Up  Culture assessed and recommendations reviewed by: []  Wes Dulaney, Pharm.D., BCPS []  Celedonio Miyamoto, Pharm.D., BCPS [x]  Georgina Pillion, [address].D., BCPS []  [place], [address].D., BCPS, AAHIVP []  Estella Husk, Pharm.D., BCPS, AAHIVP  Positive urine culture  []  Patient discharged without antimicrobial prescription and treatment is now indicated [x]  Organism is resistant to prescribed ED discharge antimicrobial []  Patient with positive blood cultures  Changes discussed with ED provider: Elpidio Anis PA-C New antibiotic prescription: Keflex 500 mg PO BID x 7 days    Kylie A Holland 02/28/2013, 6:36 PM

## 2013-03-16 ENCOUNTER — Encounter (HOSPITAL_BASED_OUTPATIENT_CLINIC_OR_DEPARTMENT_OTHER): Payer: Medicare Other

## 2013-03-16 ENCOUNTER — Encounter (HOSPITAL_COMMUNITY): Payer: Medicare Other | Attending: Oncology

## 2013-03-16 DIAGNOSIS — D638 Anemia in other chronic diseases classified elsewhere: Secondary | ICD-10-CM

## 2013-03-16 DIAGNOSIS — D649 Anemia, unspecified: Secondary | ICD-10-CM

## 2013-03-16 LAB — CBC
HCT: 40.3 % (ref 36.0–46.0)
Hemoglobin: 12.7 g/dL (ref 12.0–15.0)
MCHC: 31.5 g/dL (ref 30.0–36.0)
MCV: 103.6 fL — ABNORMAL HIGH (ref 78.0–100.0)
RBC: 3.89 MIL/uL (ref 3.87–5.11)
WBC: 5.3 10*3/uL (ref 4.0–10.5)

## 2013-03-16 NOTE — Progress Notes (Signed)
Hemoglobin was 12.7, no aranesp needed

## 2013-03-16 NOTE — Progress Notes (Signed)
Labs drawn today for cbc 

## 2013-03-22 ENCOUNTER — Ambulatory Visit (INDEPENDENT_AMBULATORY_CARE_PROVIDER_SITE_OTHER): Payer: Medicare Other | Admitting: Physician Assistant

## 2013-03-22 ENCOUNTER — Encounter: Payer: Self-pay | Admitting: Physician Assistant

## 2013-03-22 VITALS — BP 101/70 | HR 77 | Ht 63.0 in | Wt 147.2 lb

## 2013-03-22 DIAGNOSIS — I10 Essential (primary) hypertension: Secondary | ICD-10-CM

## 2013-03-22 DIAGNOSIS — I509 Heart failure, unspecified: Secondary | ICD-10-CM

## 2013-03-22 DIAGNOSIS — I4891 Unspecified atrial fibrillation: Secondary | ICD-10-CM

## 2013-03-22 DIAGNOSIS — N189 Chronic kidney disease, unspecified: Secondary | ICD-10-CM

## 2013-03-22 MED ORDER — FUROSEMIDE 20 MG PO TABS: 60.0000 mg | ORAL_TABLET | Freq: Two times a day (BID) | ORAL | Status: DC

## 2013-03-22 MED ORDER — POTASSIUM CHLORIDE CRYS ER 20 MEQ PO TBCR: EXTENDED_RELEASE_TABLET | ORAL | Status: DC

## 2013-03-22 NOTE — Assessment & Plan Note (Signed)
Creatinine 2.07 on 02/23/13 in the setting of a UTI. It had been running about 1.8. Will check today

## 2013-03-22 NOTE — Assessment & Plan Note (Signed)
Patient's atrial fibrillation is controlled. She is on Eliquis 2.5mg  twice daily.

## 2013-03-22 NOTE — Assessment & Plan Note (Signed)
Patient has evidence of heart failure and exam today. Her weight is up to 147 pounds. Back in August her weight was down to about 135-139 lbs. Her creatinine was 2.07 when she was in the emergency room with a UTI 02/23/13. She does has a history of renal insufficiency. I will increase her Lasix to 60 mg twice a day increase her potassium to 20 mEq 2 in the morning one in the evening. We'll check a BNP and bmet today and next week. She is on 2 g sodium diet

## 2013-03-22 NOTE — Patient Instructions (Addendum)
Your physician recommends that you schedule a follow-up appointment in: Monday 12/8 @ 1:30 pm  Your physician recommends that you return for lab work today BMET, BNP  Your physician has recommended you make the following change in your medication: 1. Increase Lasix to 60 mg twice a day. 2. Take KDUR 20 mg  2 tablets in the morning and 1 in the evening  Please weigh daily.

## 2013-03-22 NOTE — Progress Notes (Addendum)
HPI:  This is an 79 year old female patient Dr. Dietrich Pates who has a history of atrial fibrillation, chronic diastolic heart failure, chronic kidney disease, pulmonary hypertension, hyperlipidemia, and anxiety depression. She had an admission in June of this year with diastolic heart failure. 2-D echo at that time ejection fraction was 55-60% with pulmonary hypertension.  Patient comes in today with an aid from assisted living. She is in a wheelchair on oxygen. She complains of edema in her upper arms. She says her breathing is okay but she spends most of her time in bed. She will sit in a chair 3 times a day for about an hour. She was in the emergency room recently with urinary retention and a UTI. She denies chest pain, palpitations, dizziness or presyncope. Her aide said she did get a little out of breath during her shower this morning.   Allergies: -- Amoxicillin-Pot Clavulanate -- Diarrhea  Current Outpatient Prescriptions on File Prior to Visit: acetaminophen (TYLENOL) 325 MG tablet, Take 650 mg by mouth every 6 (six) hours as needed for pain., Disp: , Rfl:  albuterol (PROAIR HFA) 108 (90 BASE) MCG/ACT inhaler, Inhale 2 puffs into the lungs every 4 (four) hours as needed for wheezing., Disp: , Rfl:  apixaban (ELIQUIS) 2.5 MG TABS tablet, Take 2.5 mg by mouth 2 (two) times daily. , Disp: , Rfl:  atorvastatin (LIPITOR) 10 MG tablet, Take 10 mg by mouth at bedtime., Disp: , Rfl:  Calcium 600 MG tablet, Take 2 tablets (1,200 mg total) by mouth daily., Disp: 60 tablet, Rfl: 5 Cholecalciferol (VITAMIN D) 2000 UNITS CAPS, Take 1 capsule by mouth every morning.  , Disp: , Rfl:  citalopram (CELEXA) 20 MG tablet, Take 20 mg by mouth daily., Disp: , Rfl:  clonazePAM (KLONOPIN) 0.5 MG tablet, Take 0.5 mg by mouth 3 (three) times daily as needed for anxiety (agitatuion)., Disp: , Rfl:  cyanocobalamin (,VITAMIN B-12,) 1000 MCG/ML injection, Inject 1,000 mcg into the muscle every 30 (thirty) days. , Disp: ,  Rfl:  diltiazem (CARDIZEM CD) 240 MG 24 hr capsule, Take 240 mg by mouth daily.  , Disp: , Rfl:  ferrous sulfate 325 (65 FE) MG tablet, Take 325 mg by mouth daily with breakfast., Disp: , Rfl:  furosemide (LASIX) 40 MG tablet, Take 1 tablet (40 mg total) by mouth 2 (two) times daily., Disp: 60 tablet, Rfl: 12 LANTUS SOLOSTAR 100 UNIT/ML injection, Inject 12 Units into the skin daily. , Disp: , Rfl:  loratadine (CLARITIN) 10 MG tablet, Take 10 mg by mouth daily., Disp: , Rfl:  metoprolol (LOPRESSOR) 50 MG tablet, Take 50 mg by mouth 2 (two) times daily., Disp: , Rfl:  mirtazapine (REMERON) 15 MG tablet, Take 7.5 mg by mouth at bedtime., Disp: , Rfl:  nitrofurantoin, macrocrystal-monohydrate, (MACROBID) 100 MG capsule, Take 1 capsule (100 mg total) by mouth 2 (two) times daily. X 7 days, Disp: 14 capsule, Rfl: 0 pantoprazole (PROTONIX) 40 MG tablet, Take 1 tablet (40 mg total) by mouth daily., Disp: 30 tablet, Rfl: 12 polyethylene glycol (MIRALAX / GLYCOLAX) packet, Take 17 g by mouth daily as needed (constipation)., Disp: , Rfl:  potassium chloride SA (K-DUR,KLOR-CON) 20 MEQ tablet, Take 20 mEq by mouth 2 (two) times daily., Disp: , Rfl:  triamcinolone cream (KENALOG) 0.1 %, Apply 1 application topically daily. Apply to lower extremities once daily, Disp: , Rfl:   No current facility-administered medications on file prior to visit.   Past Medical History:   CHF (congestive heart failure)  Diabetes mellitus                                            Hypertension                                                 COPD (chronic obstructive pulmonary disease)                 Vertigo                                                      Cancer                                                         Comment:breast with mastectomy   Stroke                                                         Comment:patient denies   Skin disorder                                                   Comment: Epidermolysis bullosa.   Pulmonary hypertension                                       Atrial fibrillation                             2011         Multiple thyroid nodules                                     Anemia                                                       Breast cancer                                                Anemia of other chronic disease                 06/09/2012    Aortic stenosis  Monoclonal gammopathy                                        Hypothermia                                                  Renal disorder                                                 Comment:CKD   Diverticulosis                                               Squamous cell carcinoma                                      Osteoporosis, unspecified                       02/24/2013  Past Surgical History:   CHOLECYSTECTOMY                                               HEMORRHOID SURGERY                                            ABDOMINAL HYSTERECTOMY                           1991           Comment:complete hysterectomy for cancer   MASTECTOMY                                       2005           Comment:left breast, tamoxifin   goiter                                                        HIP FRACTURE SURGERY                             2010           Comment:right hip replacement   HIP FRACTURE SURGERY                             2006           Comment:left  hip replacement   COLONOSCOPY                                      ?              Comment:remote   CATARACT EXTRACTION, BILATERAL                               Review of patient's family history indicates:   Colon cancer                   Neg Hx                   Heart attack                   Son                        Comment: age 54s, suspected MI   Diabetes                       Mother                   Social History   Marital Status: Widowed             Spouse  Name:                      Years of Education:                 Number of children: 1           Occupational History Occupation          Psychiatric nurse                Social History Main Topics   Smoking Status: Never Smoker                     Smokeless Status: Never Used                       Alcohol Use: No             Drug Use: No             Sexual Activity: No                 Other Topics            Concern   None on file  Social History Narrative   Son deceased suddenly in his 55s, 10-04-2009, ?MI.    ROS: Hard of hearing, tearful, bandaged lower extremities, otherwise see history of present illness   PHYSICAL EXAM: Elderly female in a wheelchair on oxygen, in no acute distress. Neck: Increased JVD, HJR, no Bruit, or thyroid enlargement  Lungs: Decreased breath sounds throughout with crackles at the bases  Cardiovascular: Irregular irregular, PMI not displaced, 2/6 systolic murmur at the left sternal border, no gallops, bruit, thrill, or heave.  Abdomen: BS normal. Soft without organomegaly, masses, lesions or tenderness.  Extremities: Left upper arm edematous, lower extremities +1-2 edema bilaterally with bandages . Decreased distal pulses bilateral  SKin: Bandages on her lower extremities did not removed  Musculoskeletal: No deformities  Neuro: no focal signs  Ht 5\' 3"  (1.6 m)  Wt 147 lb 4 oz (66.792 kg)  BMI 26.09 kg/m2    EKG: Atrial fibrillation with right bundle branch block nonspecific ST-T wave changes, no acute  Echocardiogram; 10/06/2012 Left ventricle: The cavity size was moderately reduced. There was mild to moderate concentric hypertrophy. Systolic function was normal. The estimated ejection fraction was in the range of 55% to 60%. Wall motion was normal; there were no regional wall motion abnormalities. - Ventricular septum: Septal motion showed dyssynergy. The contour showed diastolic flattening. - Aortic valve: Mildly to moderately  calcified annulus. Trileaflet; mildly thickened leaflets. - Mitral valve: Calcified annulus. Mildly thickened, mildly calcified leaflets . Mild regurgitation. - Left atrium: The atrium was mildly to moderately dilated. - Right ventricle: The cavity size was moderately dilated. Wall thickness was normal. Systolic function was mildly to moderately reduced. - Right atrium: The atrium was moderately dilated. - Atrial septum: No defect or patent foramen ovale was identified. - Tricuspid valve: Mild-moderate regurgitation. - Pulmonary arteries: Systolic pressure was moderately increased. PA peak pressure: 60mm Hg (S). - Pericardium, extracardiac: There was a small right pleural effusion. Impressions:  - Compared to the previous study performed 12/18/2009, pulmonary hypertension now present with RV enlargement and dysfunction and a right pleural effusion.

## 2013-03-23 LAB — BASIC METABOLIC PANEL
BUN: 43 mg/dL — ABNORMAL HIGH (ref 6–23)
CO2: 26 mEq/L (ref 19–32)
Calcium: 8.8 mg/dL (ref 8.4–10.5)
Chloride: 103 mEq/L (ref 96–112)
Creat: 1.67 mg/dL — ABNORMAL HIGH (ref 0.50–1.10)
Glucose, Bld: 70 mg/dL (ref 70–99)

## 2013-04-05 ENCOUNTER — Encounter: Payer: Self-pay | Admitting: Adult Health

## 2013-04-05 ENCOUNTER — Encounter: Payer: Medicare Other | Admitting: Adult Health

## 2013-04-05 NOTE — Progress Notes (Signed)
HPI: Mrs. Bryson Ha is an 79 year old former patient of Dr. Dietrich Pates we are following for ongoing assessment and management of atrial fibrillation, chronic diastolic CHF, history of chronic kidney disease, pulmonary hypertension, hyperlipidemia, anxiety and depression. The patient was last seen in the office in November of 2014. The patient is very sedentary use in the bed or in a wheelchair for limited period of time. On the last visit, the patient was found to be in CHF with a weight gain of 147 pounds from 135 pounds. Her Lasix was increased to 60 mg twice a day. Potassium was increased to 20 mEq twice a day. A followup BNP and BMET was scheduled. Followup demonstrated a sodium of 141, potassium of 4.5, chloride 103, CO2 26, BUN 43, with creatinine of 1.67.  Allergies  Allergen Reactions  . Amoxicillin-Pot Clavulanate Diarrhea    Current Outpatient Prescriptions  Medication Sig Dispense Refill  . acetaminophen (TYLENOL) 325 MG tablet Take 650 mg by mouth every 6 (six) hours as needed for pain.      Marland Kitchen albuterol (PROAIR HFA) 108 (90 BASE) MCG/ACT inhaler Inhale 2 puffs into the lungs every 4 (four) hours as needed for wheezing.      Marland Kitchen apixaban (ELIQUIS) 2.5 MG TABS tablet Take 2.5 mg by mouth 2 (two) times daily.       Marland Kitchen atorvastatin (LIPITOR) 10 MG tablet Take 10 mg by mouth at bedtime.      . Calcium 600 MG tablet Take 2 tablets (1,200 mg total) by mouth daily.  60 tablet  5  . Cholecalciferol (VITAMIN D) 2000 UNITS CAPS Take 1 capsule by mouth every morning.        . citalopram (CELEXA) 20 MG tablet Take 20 mg by mouth daily.      . clonazePAM (KLONOPIN) 0.5 MG tablet Take 0.5 mg by mouth 3 (three) times daily as needed for anxiety (agitatuion).      . cyanocobalamin (,VITAMIN B-12,) 1000 MCG/ML injection Inject 1,000 mcg into the muscle every 30 (thirty) days.       Marland Kitchen diltiazem (CARDIZEM CD) 240 MG 24 hr capsule Take 240 mg by mouth daily.        . ferrous sulfate 325 (65 FE) MG tablet Take  325 mg by mouth daily with breakfast.      . furosemide (LASIX) 20 MG tablet Take 3 tablets (60 mg total) by mouth 2 (two) times daily.  180 tablet  3  . LANTUS SOLOSTAR 100 UNIT/ML injection Inject 12 Units into the skin daily.       Marland Kitchen loratadine (CLARITIN) 10 MG tablet Take 10 mg by mouth daily.      . metoprolol (LOPRESSOR) 50 MG tablet Take 50 mg by mouth 2 (two) times daily.      . mirtazapine (REMERON) 15 MG tablet Take 7.5 mg by mouth at bedtime.      . mupirocin ointment (BACTROBAN) 2 %       . NOVOFINE AUTOCOVER 30G X 8 MM MISC       . pantoprazole (PROTONIX) 40 MG tablet Take 1 tablet (40 mg total) by mouth daily.  30 tablet  12  . polyethylene glycol (MIRALAX / GLYCOLAX) packet Take 17 g by mouth daily as needed (constipation).      . potassium chloride SA (K-DUR,KLOR-CON) 20 MEQ tablet Take 2 tablets in the a.m and 1 in the evening.  90 tablet  3  . triamcinolone cream (KENALOG) 0.1 % Apply 1 application topically  daily. Apply to lower extremities once daily       No current facility-administered medications for this visit.    Past Medical History  Diagnosis Date  . CHF (congestive heart failure)   . Diabetes mellitus   . Hypertension   . COPD (chronic obstructive pulmonary disease)   . Vertigo   . Cancer     breast with mastectomy  . Stroke      patient denies  . Skin disorder      Epidermolysis bullosa.  . Pulmonary hypertension   . Atrial fibrillation 2011  . Multiple thyroid nodules   . Anemia   . Breast cancer   . Anemia of other chronic disease 06/09/2012  . Aortic stenosis   . Monoclonal gammopathy   . Hypothermia   . Renal disorder     CKD  . Diverticulosis   . Squamous cell carcinoma   . Osteoporosis, unspecified 02/24/2013    Past Surgical History  Procedure Laterality Date  . Cholecystectomy    . Hemorrhoid surgery    . Abdominal hysterectomy  1991    complete hysterectomy for cancer  . Mastectomy  2005    left breast, tamoxifin  . Goiter      . Hip fracture surgery  2010    right hip replacement  . Hip fracture surgery  2006    left hip replacement  . Colonoscopy  ?    remote  . Cataract extraction, bilateral      ROS: PHYSICAL EXAM There were no vitals taken for this visit.  EKG:  ASSESSMENT AND PLAN

## 2013-04-07 ENCOUNTER — Encounter: Payer: Self-pay | Admitting: *Deleted

## 2013-04-07 NOTE — Addendum Note (Signed)
Addended by: Thompson Grayer on: 04/07/2013 09:42 AM   Modules accepted: Orders

## 2013-04-16 ENCOUNTER — Encounter (HOSPITAL_COMMUNITY): Payer: Medicare Other | Attending: Oncology

## 2013-04-16 ENCOUNTER — Encounter (HOSPITAL_COMMUNITY): Payer: Medicare Other

## 2013-04-16 DIAGNOSIS — D638 Anemia in other chronic diseases classified elsewhere: Secondary | ICD-10-CM

## 2013-04-16 LAB — CBC
HCT: 37.4 % (ref 36.0–46.0)
Hemoglobin: 12 g/dL (ref 12.0–15.0)
MCHC: 32.1 g/dL (ref 30.0–36.0)
Platelets: 182 10*3/uL (ref 150–400)
RDW: 14.8 % (ref 11.5–15.5)
WBC: 5 10*3/uL (ref 4.0–10.5)

## 2013-04-16 NOTE — Progress Notes (Unsigned)
Catherine [name] presented for labwork. Labs per MD order drawn via Peripheral Line 23 gauge needle inserted in RT AC Good blood return present. Procedure without incident.  Needle removed intact. Patient tolerated procedure well.

## 2013-04-17 LAB — BASIC METABOLIC PANEL
BUN: 59 mg/dL — ABNORMAL HIGH (ref 6–23)
Calcium: 9 mg/dL (ref 8.4–10.5)
Creat: 2.12 mg/dL — ABNORMAL HIGH (ref 0.50–1.10)
Glucose, Bld: 205 mg/dL — ABNORMAL HIGH (ref 70–99)
Potassium: 5.5 mEq/L — ABNORMAL HIGH (ref 3.5–5.3)

## 2013-04-17 LAB — BRAIN NATRIURETIC PEPTIDE: Brain Natriuretic Peptide: 596.1 pg/mL — ABNORMAL HIGH (ref 0.0–100.0)

## 2013-04-20 ENCOUNTER — Ambulatory Visit (INDEPENDENT_AMBULATORY_CARE_PROVIDER_SITE_OTHER): Payer: Medicare Other | Admitting: Adult Health

## 2013-04-20 ENCOUNTER — Encounter: Payer: Self-pay | Admitting: Adult Health

## 2013-04-20 VITALS — BP 112/74 | HR 56 | Ht 62.0 in | Wt 148.0 lb

## 2013-04-20 DIAGNOSIS — I509 Heart failure, unspecified: Secondary | ICD-10-CM

## 2013-04-20 DIAGNOSIS — I10 Essential (primary) hypertension: Secondary | ICD-10-CM

## 2013-04-20 DIAGNOSIS — I4891 Unspecified atrial fibrillation: Secondary | ICD-10-CM

## 2013-04-20 DIAGNOSIS — I5032 Chronic diastolic (congestive) heart failure: Secondary | ICD-10-CM

## 2013-04-20 MED ORDER — POTASSIUM CHLORIDE CRYS ER 20 MEQ PO TBCR: 20.0000 meq | EXTENDED_RELEASE_TABLET | Freq: Every day | ORAL | Status: DC

## 2013-04-20 NOTE — Assessment & Plan Note (Signed)
No evidence of decompensation. Will continue lasix, decreased potassium to 20 mEq. See her in March

## 2013-04-20 NOTE — Progress Notes (Deleted)
Name: Catherine [name]    DOB: [birth year]  Age: 79 y.o.  MR#: [id number]       PCP:  Carylon Perches, MD      Insurance: Payor: [employer] / Plan: BLUE MEDICARE / Product Type: *No Product type* /   CC:    Chief Complaint  Patient presents with  . Atrial Fibrillation  . Congestive Heart Failure    Diastolic    VS Filed Vitals:   04/20/13 1359  BP: 112/74  Pulse: 56  Height: 5\' 2"  (1.575 m)  Weight: 148 lb (67.132 kg)    Weights Current Weight  04/20/13 148 lb (67.132 kg)  03/22/13 147 lb 4 oz (66.792 kg)  02/24/13 153 lb (69.4 kg)    Blood Pressure  BP Readings from Last 3 Encounters:  04/20/13 112/74  03/22/13 101/70  02/24/13 105/65     Admit date:  (Not on file) Last encounter with RMR:  11/26/2012   Allergy Amoxicillin-pot clavulanate  Current Outpatient Prescriptions  Medication Sig Dispense Refill  . acetaminophen (TYLENOL) 325 MG tablet Take 650 mg by mouth every 6 (six) hours as needed for pain.      Marland Kitchen albuterol (PROAIR HFA) 108 (90 BASE) MCG/ACT inhaler Inhale 2 puffs into the lungs every 4 (four) hours as needed for wheezing.      Marland Kitchen apixaban (ELIQUIS) 2.5 MG TABS tablet Take 2.5 mg by mouth 2 (two) times daily.       Marland Kitchen atorvastatin (LIPITOR) 10 MG tablet Take 10 mg by mouth at bedtime.      . Calcium 600 MG tablet Take 2 tablets (1,200 mg total) by mouth daily.  60 tablet  5  . Cholecalciferol (VITAMIN D) 2000 UNITS CAPS Take 1 capsule by mouth every morning.        . ciprofloxacin (CIPRO) 250 MG tablet       . citalopram (CELEXA) 20 MG tablet Take 20 mg by mouth daily.      . clonazePAM (KLONOPIN) 0.5 MG tablet Take 0.5 mg by mouth 3 (three) times daily as needed for anxiety (agitatuion).      . cyanocobalamin (,VITAMIN B-12,) 1000 MCG/ML injection Inject 1,000 mcg into the muscle every 30 (thirty) days.       Marland Kitchen diltiazem (CARDIZEM CD) 240 MG 24 hr capsule Take 240 mg by mouth daily.        . ferrous sulfate 325 (65 FE) MG tablet Take 325 mg  by mouth daily with breakfast.      . fluconazole (DIFLUCAN) 150 MG tablet       . furosemide (LASIX) 20 MG tablet Take 3 tablets (60 mg total) by mouth 2 (two) times daily.  180 tablet  3  . LANTUS SOLOSTAR 100 UNIT/ML injection Inject 12 Units into the skin daily.       Marland Kitchen loratadine (CLARITIN) 10 MG tablet Take 10 mg by mouth daily.      . metoprolol (LOPRESSOR) 50 MG tablet Take 50 mg by mouth 2 (two) times daily.      . mirtazapine (REMERON) 15 MG tablet Take 7.5 mg by mouth at bedtime.      . mupirocin ointment (BACTROBAN) 2 %       . NOVOFINE AUTOCOVER 30G X 8 MM MISC       . pantoprazole (PROTONIX) 40 MG tablet Take 1 tablet (40 mg total) by mouth daily.  30 tablet  12  . polyethylene glycol (MIRALAX / GLYCOLAX) packet  Take 17 g by mouth daily as needed (constipation).      . potassium chloride SA (K-DUR,KLOR-CON) 20 MEQ tablet Take 2 tablets in the a.m and 1 in the evening.  90 tablet  3  . triamcinolone cream (KENALOG) 0.1 % Apply 1 application topically daily. Apply to lower extremities once daily       No current facility-administered medications for this visit.    Discontinued Meds:   There are no discontinued medications.  Patient Active Problem List   Diagnosis Date Noted  . Osteoporosis, unspecified 02/24/2013  . Thrombocytopenia 01/06/2013  . BRBPR (bright red blood per rectum) 01/06/2013  . Anemia associated with acute blood loss 01/06/2013  . Diabetic ulcer of lower extremity 01/06/2013  . MGUS (monoclonal gammopathy of unknown significance) 01/06/2013  . Neurodermatitis 01/06/2013  . Diverticulosis 01/06/2013  . Diabetes 01/06/2013  . Fever, unspecified 12/05/2012  . Pleural effusion 12/05/2012  . Nausea with vomiting 12/05/2012  . Chronic respiratory failure 12/05/2012  . Chronic cor pulmonale 10/08/2012  . Chronic kidney disease (CKD) stage G4/A1, severely decreased glomerular filtration rate (GFR) between 15-29 mL/min/1.73 square meter and albuminuria  creatinine ratio less than 30 mg/g 10/07/2012  . Hypertension   . Atrial fibrillation   . Chronic diastolic CHF (congestive heart failure) 10/06/2012  . Community acquired pneumonia 10/06/2012  . Anemia of other chronic disease 06/09/2012    LABS    Component Value Date/Time   NA 138 04/16/2013 1342   NA 141 03/22/2013 1236   NA 139 02/23/2013 1111   K 5.5* 04/16/2013 1342   K 4.5 03/22/2013 1236   K 4.2 02/23/2013 1111   CL 102 04/16/2013 1342   CL 103 03/22/2013 1236   CL 100 02/23/2013 1111   CO2 26 04/16/2013 1342   CO2 26 03/22/2013 1236   CO2 28 02/23/2013 1111   GLUCOSE 205* 04/16/2013 1342   GLUCOSE 70 03/22/2013 1236   GLUCOSE 148* 02/23/2013 1111   BUN 59* 04/16/2013 1342   BUN 43* 03/22/2013 1236   BUN 59* 02/23/2013 1111   CREATININE 2.12* 04/16/2013 1342   CREATININE 1.67* 03/22/2013 1236   CREATININE 2.07* 02/23/2013 1111   CREATININE 1.73* 01/12/2013 1051   CREATININE 1.63* 01/08/2013 0533   CREATININE 1.55* 11/30/2012 0745   CALCIUM 9.0 04/16/2013 1342   CALCIUM 8.8 03/22/2013 1236   CALCIUM 9.7 02/23/2013 1111   GFRNONAA 20* 02/23/2013 1111   GFRNONAA 25* 01/12/2013 1051   GFRNONAA 27* 01/08/2013 0533   GFRAA 24* 02/23/2013 1111   GFRAA 29* 01/12/2013 1051   GFRAA 32* 01/08/2013 0533   CMP     Component Value Date/Time   NA 138 04/16/2013 1342   K 5.5* 04/16/2013 1342   CL 102 04/16/2013 1342   CO2 26 04/16/2013 1342   GLUCOSE 205* 04/16/2013 1342   BUN 59* 04/16/2013 1342   CREATININE 2.12* 04/16/2013 1342   CREATININE 2.07* 02/23/2013 1111   CALCIUM 9.0 04/16/2013 1342   PROT 7.5 02/23/2013 1111   ALBUMIN 3.6 02/23/2013 1111   AST 18 02/23/2013 1111   ALT 10 02/23/2013 1111   ALKPHOS 114 02/23/2013 1111   BILITOT 0.7 02/23/2013 1111   GFRNONAA 20* 02/23/2013 1111   GFRAA 24* 02/23/2013 1111       Component Value Date/Time   WBC 5.0 04/16/2013 1056   WBC 5.3 03/16/2013 1141   WBC 5.7 02/23/2013 1045   HGB 12.0 04/16/2013 1056   HGB  12.7 03/16/2013 1141   HGB 12.4  02/23/2013 1045   HCT 37.4 04/16/2013 1056   HCT 40.3 03/16/2013 1141   HCT 39.1 02/23/2013 1045   MCV 102.7* 04/16/2013 1056   MCV 103.6* 03/16/2013 1141   MCV 103.7* 02/23/2013 1045    Lipid Panel     Component Value Date/Time   CHOL 77 10/07/2012 0457   TRIG 43 10/07/2012 0457   HDL 52 10/07/2012 0457   CHOLHDL 1.5 10/07/2012 0457   VLDL 9 10/07/2012 0457   LDLCALC 16 10/07/2012 0457    ABG    Component Value Date/Time   PHART 7.404* 01/26/2010 1637   PCO2ART 46.1* 01/26/2010 1637   PO2ART 52.0* 01/26/2010 1637   HCO3 28.2* 01/26/2010 1637   TCO2 26.3 01/26/2010 1637   O2SAT 85.1 01/26/2010 1637     Lab Results  Component Value Date   TSH 1.041 12/16/2009   BNP (last 3 results)  Recent Labs  10/06/12 0050 10/09/12 0445 10/22/12 1540  PROBNP 4853.0* 4038.0* 6911.00*   Cardiac Panel (last 3 results) No results found for this basename: CKTOTAL, CKMB, TROPONINI, RELINDX,  in the last 72 hours  Iron/TIBC/Ferritin    Component Value Date/Time   FERRITIN 168 01/12/2013 1051     EKG Orders placed in visit on 03/22/13  . EKG 12-LEAD     Prior Assessment and Plan Problem List as of 04/20/2013   Anemia of other chronic disease   Chronic diastolic CHF (congestive heart failure)   Last Assessment & Plan   03/22/2013 Office Visit Written 03/22/2013 12:24 PM by Dyann Kief, PA-C     Patient has evidence of heart failure and exam today. Her weight is up to 147 pounds. Back in August her weight was down to about 135-139 lbs. Her creatinine was 2.07 when she was in the emergency room with a UTI 02/23/13. She does has a history of renal insufficiency. I will increase her Lasix to 60 mg twice a day increase her potassium to 20 mEq 2 in the morning one in the evening. We'll check a BNP and bmet today and next week. She is on 2 g sodium diet    Community acquired pneumonia   Hypertension   Last Assessment & Plan   11/26/2012 Office Visit Written  11/26/2012  3:53 PM by Jodelle Gross, NP     Excellent control blood pressure. No changes in her medications this time. [place] followup labs on next visit.    Atrial fibrillation   Last Assessment & Plan   03/22/2013 Office Visit Written 03/22/2013 12:26 PM by Dyann Kief, PA-C     Patient's atrial fibrillation is controlled. She is on Eliquis 2.5mg  twice daily.    Chronic kidney disease (CKD) stage G4/A1, severely decreased glomerular filtration rate (GFR) between 15-29 mL/min/1.73 square meter and albuminuria creatinine ratio less than 30 mg/g   Last Assessment & Plan   03/22/2013 Office Visit Written 03/22/2013 12:27 PM by Dyann Kief, PA-C     Creatinine 2.07 on 02/23/13 in the setting of a UTI. It had been running about 1.8. Will check today    Chronic cor pulmonale   Fever, unspecified   Pleural effusion   Nausea with vomiting   Chronic respiratory failure   Thrombocytopenia   BRBPR (bright red blood per rectum)   Anemia associated with acute blood loss   Diabetic ulcer of lower extremity   MGUS (monoclonal gammopathy of unknown significance)   Neurodermatitis   Diverticulosis   Diabetes   Osteoporosis, unspecified  Imaging: No results found.

## 2013-04-20 NOTE — Assessment & Plan Note (Signed)
Blood pressure is well controlled. No changes.

## 2013-04-20 NOTE — Patient Instructions (Signed)
Your physician recommends that you schedule a follow-up appointment in: 3 months You will receive a reminder letter two months in advance reminding you to call and schedule your appointment. If you don't receive this letter, please contact our office.  Your physician has recommended you make the following change in your medication:  1. Take Potassium 20 mEq daily

## 2013-04-20 NOTE — Progress Notes (Signed)
HPI: Catherine [name] is an 79 year old former patient of Dr. Dietrich Pates we are following for ongoing assessment and management of atrial fibrillation, chronic diastolic CHF with a history of chronic kidney disease, pulmonary hypertension, hyperlipidemia, anxiety and depression. She was last in the office in Nov 2014 she was found to be in CHF with a weight gain of approximately 12 pounds. Her Lasix was increased to 60 mg along with increased potassium. She did not come for her followup appointment one month later.    She comes today feeling very well. She denies chest pain or worsening dyspnea. She has seen Dr. Ouida Sills today who has reduced her potassium to 20 mEq daily as her level was 5.5.   Allergies  Allergen Reactions  . Amoxicillin-Pot Clavulanate Diarrhea    Current Outpatient Prescriptions  Medication Sig Dispense Refill  . acetaminophen (TYLENOL) 325 MG tablet Take 650 mg by mouth every 6 (six) hours as needed for pain.      Marland Kitchen albuterol (PROAIR HFA) 108 (90 BASE) MCG/ACT inhaler Inhale 2 puffs into the lungs every 4 (four) hours as needed for wheezing.      Marland Kitchen apixaban (ELIQUIS) 2.5 MG TABS tablet Take 2.5 mg by mouth 2 (two) times daily.       Marland Kitchen atorvastatin (LIPITOR) 10 MG tablet Take 10 mg by mouth at bedtime.      . Calcium 600 MG tablet Take 2 tablets (1,200 mg total) by mouth daily.  60 tablet  5  . Cholecalciferol (VITAMIN D) 2000 UNITS CAPS Take 1 capsule by mouth every morning.        . ciprofloxacin (CIPRO) 250 MG tablet       . citalopram (CELEXA) 20 MG tablet Take 20 mg by mouth daily.      . clonazePAM (KLONOPIN) 0.5 MG tablet Take 0.5 mg by mouth 3 (three) times daily as needed for anxiety (agitatuion).      . cyanocobalamin (,VITAMIN B-12,) 1000 MCG/ML injection Inject 1,000 mcg into the muscle every 30 (thirty) days.       Marland Kitchen diltiazem (CARDIZEM CD) 240 MG 24 hr capsule Take 240 mg by mouth daily.        . ferrous sulfate 325 (65 FE) MG tablet Take 325 mg by mouth daily with  breakfast.      . fluconazole (DIFLUCAN) 150 MG tablet       . furosemide (LASIX) 20 MG tablet Take 3 tablets (60 mg total) by mouth 2 (two) times daily.  180 tablet  3  . LANTUS SOLOSTAR 100 UNIT/ML injection Inject 12 Units into the skin daily.       Marland Kitchen loratadine (CLARITIN) 10 MG tablet Take 10 mg by mouth daily.      . metoprolol (LOPRESSOR) 50 MG tablet Take 50 mg by mouth 2 (two) times daily.      . mirtazapine (REMERON) 15 MG tablet Take 7.5 mg by mouth at bedtime.      . mupirocin ointment (BACTROBAN) 2 %       . NOVOFINE AUTOCOVER 30G X 8 MM MISC       . pantoprazole (PROTONIX) 40 MG tablet Take 1 tablet (40 mg total) by mouth daily.  30 tablet  12  . polyethylene glycol (MIRALAX / GLYCOLAX) packet Take 17 g by mouth daily as needed (constipation).      . potassium chloride SA (K-DUR,KLOR-CON) 20 MEQ tablet Take 2 tablets in the a.m and 1 in the evening.  90 tablet  3  . triamcinolone cream (KENALOG) 0.1 % Apply 1 application topically daily. Apply to lower extremities once daily       No current facility-administered medications for this visit.    Past Medical History  Diagnosis Date  . CHF (congestive heart failure)   . Diabetes mellitus   . Hypertension   . COPD (chronic obstructive pulmonary disease)   . Vertigo   . Cancer     breast with mastectomy  . Stroke      patient denies  . Skin disorder      Epidermolysis bullosa.  . Pulmonary hypertension   . Atrial fibrillation 2011  . Multiple thyroid nodules   . Anemia   . Breast cancer   . Anemia of other chronic disease 06/09/2012  . Aortic stenosis   . Monoclonal gammopathy   . Hypothermia   . Renal disorder     CKD  . Diverticulosis   . Squamous cell carcinoma   . Osteoporosis, unspecified 02/24/2013    Past Surgical History  Procedure Laterality Date  . Cholecystectomy    . Hemorrhoid surgery    . Abdominal hysterectomy  1991    complete hysterectomy for cancer  . Mastectomy  2005    left breast,  tamoxifin  . Goiter    . Hip fracture surgery  2010    right hip replacement  . Hip fracture surgery  2006    left hip replacement  . Colonoscopy  ?    remote  . Cataract extraction, bilateral      GNF:AOZHYQ of systems complete and found to be negative unless listed above   PHYSICAL EXAM BP 112/74  Pulse 56  Ht 5\' 2"  (1.575 m)  Wt 148 lb (67.132 kg)  BMI 27.06 kg/m2  General: Well developed, well nourished, in no acute distress, wearing O2.  Head: Eyes PERRLA, No xanthomas.   Normal cephalic and atramatic  Lungs: Clear bilaterally to auscultation and percussion. Heart: HRRR S1 S2, without MRG.  Pulses are 2+ & equal.            No carotid bruit. No JVD.  No abdominal bruits. No femoral bruits. Abdomen: Bowel sounds are positive, abdomen soft and non-tender without masses or                  Hernia's noted. Msk:  Back normal, normal gait. Normal strength and tone for age. Extremities: No clubbing, cyanosis or edema, skin thickening..  DP +1 Neuro: Alert and oriented X 3. Psych:  Good affect, responds appropriately    ASSESSMENT AND PLAN

## 2013-04-20 NOTE — Assessment & Plan Note (Signed)
Heart rate is well controlled. No changes to medications. Continue Eliquis.

## 2013-04-27 ENCOUNTER — Ambulatory Visit (HOSPITAL_COMMUNITY): Payer: Medicare Other

## 2013-04-27 ENCOUNTER — Other Ambulatory Visit (HOSPITAL_COMMUNITY): Payer: Medicare Other

## 2013-04-28 NOTE — Progress Notes (Signed)
This encounter was created in error - please disregard.

## 2013-05-06 ENCOUNTER — Encounter (HOSPITAL_BASED_OUTPATIENT_CLINIC_OR_DEPARTMENT_OTHER): Payer: Medicare Other

## 2013-05-06 ENCOUNTER — Encounter (HOSPITAL_COMMUNITY): Payer: Medicare Other | Attending: Oncology

## 2013-05-06 VITALS — BP 102/61 | HR 68 | Temp 97.4°F | Resp 18

## 2013-05-06 DIAGNOSIS — M810 Age-related osteoporosis without current pathological fracture: Secondary | ICD-10-CM

## 2013-05-06 DIAGNOSIS — D62 Acute posthemorrhagic anemia: Secondary | ICD-10-CM | POA: Insufficient documentation

## 2013-05-06 DIAGNOSIS — D472 Monoclonal gammopathy: Secondary | ICD-10-CM | POA: Insufficient documentation

## 2013-05-06 DIAGNOSIS — D638 Anemia in other chronic diseases classified elsewhere: Secondary | ICD-10-CM | POA: Diagnosis present

## 2013-05-06 LAB — CBC
HCT: 37.9 % (ref 36.0–46.0)
HEMOGLOBIN: 12.2 g/dL (ref 12.0–15.0)
MCH: 32.7 pg (ref 26.0–34.0)
MCHC: 32.2 g/dL (ref 30.0–36.0)
MCV: 101.6 fL — ABNORMAL HIGH (ref 78.0–100.0)
PLATELETS: 184 10*3/uL (ref 150–400)
RBC: 3.73 MIL/uL — ABNORMAL LOW (ref 3.87–5.11)
RDW: 14.8 % (ref 11.5–15.5)
WBC: 4.7 10*3/uL (ref 4.0–10.5)

## 2013-05-06 LAB — LACTATE DEHYDROGENASE: LDH: 252 U/L — AB (ref 94–250)

## 2013-05-06 NOTE — Progress Notes (Signed)
[facility]  OFFICE PROGRESS Catherine Miner, MD [address] [place] [address]  DIAGNOSIS: Anemia associated with acute blood loss - Plan: Ferritin, Ferritin  MGUS (monoclonal gammopathy of unknown significance) - Plan: Beta 2 microglobuline, serum, CANCELED: Beta 2 microglobuline, serum  Anemia of other chronic disease - Plan: CBC  Osteoporosis, unspecified  Chief Complaint  Patient presents with  . Anemia    MGUS    CURRENT THERAPY: Aranesp every 3 weeks if hemoglobin is less than 11 for anemia of chronic disease in association with chronic renal failure and erythropoietin deficiency.  INTERVAL HISTORY: Catherine [name] 80 y.o. female returns for followup of anemia of chronic disease in Association with chronic renal failure and erythropoietin deficiency receiving Aranesp every 3 weeks if hemoglobin is less than 11. Last treatment was on 01/12/2013 receiving 200 mcg. She also receives Prolia 60 mg every 6 months with last treatment on 02/23/2013. She was able to meet with her family a total of 20 people do have there at her home. She continues to live in a long-term care facility. She feels well with no new specific complaints. She did have diarrhea last week with one day of vomiting has had happened to other residence in her facility. She denies a cough, wheezing, but does have episodes of epistaxis 2 to nasal oxygen which is on 24 hours a day. She denies any worsening lower extremity swelling or redness, fever, night sweats, cough, wheezing, melena, hematochezia, hematuria, vaginal bleeding, or incontinence.  MEDICAL HISTORY: Past Medical History  Diagnosis Date  . CHF (congestive heart failure)   . Diabetes mellitus   . Hypertension   . COPD (chronic obstructive pulmonary disease)   . Vertigo   . Cancer     breast with mastectomy  . Stroke      patient denies  . Skin disorder      Epidermolysis bullosa.  .  Pulmonary hypertension   . Atrial fibrillation 2011  . Multiple thyroid nodules   . Anemia   . Breast cancer   . Anemia of other chronic disease 06/09/2012  . Aortic stenosis   . Monoclonal gammopathy   . Hypothermia   . Renal disorder     CKD  . Diverticulosis   . Squamous cell carcinoma   . Osteoporosis, unspecified 02/24/2013    INTERIM HISTORY: has Anemia of other chronic disease; Chronic diastolic CHF (congestive heart failure); Community acquired pneumonia; Hypertension; Atrial fibrillation; Chronic kidney disease (CKD) stage G4/A1, severely decreased glomerular filtration rate (GFR) between 15-29 mL/min/1.73 square meter and albuminuria creatinine ratio less than 30 mg/g; Chronic cor pulmonale; Fever, unspecified; Pleural effusion; Nausea with vomiting; Chronic respiratory failure; Thrombocytopenia; BRBPR (bright red blood per rectum); Anemia associated with acute blood loss; Diabetic ulcer of lower extremity; MGUS (monoclonal gammopathy of unknown significance); Neurodermatitis; Diverticulosis; Diabetes; and Osteoporosis, unspecified on her problem list.    ALLERGIES:  is allergic to amoxicillin-pot clavulanate.  MEDICATIONS: has a current medication list which includes the following prescription(s): apixaban, atorvastatin, calcium, vitamin d, citalopram, cyanocobalamin, diltiazem, ferrous sulfate, furosemide, lantus solostar, loratadine, metoprolol, mirtazapine, mupirocin ointment, novofine autocover, pantoprazole, potassium chloride sa, triamcinolone cream, acetaminophen, albuterol, clonazepam, fluconazole, and polyethylene glycol.  SURGICAL HISTORY:  Past Surgical History  Procedure Laterality Date  . Cholecystectomy    . Hemorrhoid surgery    . Abdominal hysterectomy  1991    complete hysterectomy for cancer  . Mastectomy  2005    left breast, tamoxifin  . Goiter    . Hip fracture surgery  2010    right hip replacement  . Hip fracture surgery  2006    left hip  replacement  . Colonoscopy  ?    remote  . Cataract extraction, bilateral      FAMILY HISTORY: family history includes Diabetes in her mother; Heart attack in her son. There is no history of Colon cancer.  SOCIAL HISTORY:  reports that she has never smoked. She has never used smokeless tobacco. She reports that she does not drink alcohol or use illicit drugs.  REVIEW OF SYSTEMS:  Other than that discussed above is noncontributory.  PHYSICAL EXAMINATION: ECOG PERFORMANCE STATUS: 2 - Symptomatic, <50% confined to bed  Blood pressure 102/61, pulse 68, temperature 97.4 F (36.3 C), temperature source Oral, resp. rate 18.  GENERAL:alert, no distress and comfortable SKIN: skin color, texture, turgor are normal, no rashes or significant lesions EYES: PERLA; Conjunctiva are pink and non-injected, sclera clear OROPHARYNX:no exudate, no erythema on lips, buccal mucosa, or tongue. NECK: supple, thyroid normal size, non-tender, without nodularity. No masses CHEST: Normal AP diameter with no breast masses. LYMPH:  no palpable lymphadenopathy in the cervical, axillary or inguinal LUNGS: clear to auscultation and percussion with normal breathing effort HEART: Irregularly irregular with no S3. and no murmurs. ABDOMEN:abdomen soft, non-tender and normal bowel sounds MUSCULOSKELETAL:no cyanosis of digits and no clubbing. Range of motion normal.  NEURO: alert & oriented x 3 with fluent speech, no focal motor/sensory deficits   LABORATORY DATA:  Office Visit on 05/06/2013  Component Date Value Range Status  . WBC 05/06/2013 4.7  4.0 - 10.5 K/uL Final  . RBC 05/06/2013 3.73* 3.87 - 5.11 MIL/uL Final  . Hemoglobin 05/06/2013 12.2  12.0 - 15.0 g/dL Final  . HCT 05/06/2013 37.9  36.0 - 46.0 % Final  . MCV 05/06/2013 101.6* 78.0 - 100.0 fL Final  . MCH 05/06/2013 32.7  26.0 - 34.0 pg Final  . MCHC 05/06/2013 32.2  30.0 - 36.0 g/dL Final  . RDW 05/06/2013 14.8  11.5 - 15.5 % Final  . Platelets  05/06/2013 184  150 - 400 K/uL Final  Appointment on 05/06/2013  Component Date Value Range Status  . Total Protein 05/06/2013 PENDING  6.0 - 8.3 g/dL Incomplete  . Albumin ELP 05/06/2013 PENDING  55.8 - 66.1 % Incomplete  . Alpha-1-Globulin 05/06/2013 PENDING  2.9 - 4.9 % Incomplete  . Alpha-2-Globulin 05/06/2013 PENDING  7.1 - 11.8 % Incomplete  . Beta Globulin 05/06/2013 PENDING  4.7 - 7.2 % Incomplete  . Beta 2 05/06/2013 PENDING  3.2 - 6.5 % Incomplete  . Gamma Globulin 05/06/2013 PENDING  11.1 - 18.8 % Incomplete  . M-Spike, % 05/06/2013 PENDING   Incomplete  . SPE Interp. 05/06/2013 PENDING   Incomplete  . Comment 05/06/2013 PENDING   Incomplete  . IgG (Immunoglobin G), Serum 05/06/2013 PENDING  694 - 1618 mg/dL Incomplete  . IgA 05/06/2013 PENDING  68 - 378 mg/dL Incomplete  . IgM, Serum 05/06/2013 PENDING  60 - 263 mg/dL Incomplete  . Immunofix Electr Int 05/06/2013 PENDING   Incomplete  Infusion on 04/16/2013  Component Date Value Range Status  . WBC 04/16/2013 5.0  4.0 - 10.5 K/uL Final  . RBC 04/16/2013 3.64* 3.87 - 5.11 MIL/uL Final  . Hemoglobin 04/16/2013 12.0  12.0 - 15.0 g/dL Final  . HCT 04/16/2013 37.4  36.0 - 46.0 % Final  . MCV  04/16/2013 102.7* 78.0 - 100.0 fL Final  . MCH 04/16/2013 33.0  26.0 - 34.0 pg Final  . MCHC 04/16/2013 32.1  30.0 - 36.0 g/dL Final  . RDW 04/16/2013 14.8  11.5 - 15.5 % Final  . Platelets 04/16/2013 182  150 - 400 K/uL Final    PATHOLOGY: No new pathology.  Urinalysis    Component Value Date/Time   COLORURINE YELLOW 02/24/2013 1411   APPEARANCEUR HAZY* 02/24/2013 1411   LABSPEC 1.020 02/24/2013 1411   PHURINE 5.5 02/24/2013 1411   GLUCOSEU NEGATIVE 02/24/2013 1411   HGBUR TRACE* 02/24/2013 1411   BILIRUBINUR NEGATIVE 02/24/2013 1411   KETONESUR NEGATIVE 02/24/2013 1411   PROTEINUR NEGATIVE 02/24/2013 1411   UROBILINOGEN 0.2 02/24/2013 1411   NITRITE NEGATIVE 02/24/2013 1411   LEUKOCYTESUR TRACE* 02/24/2013 1411     RADIOGRAPHIC STUDIES: No results found.  ASSESSMENT:  #1. Anemia of chronic disease in Association with chronic renal failure, stable. #2. MGUS, stable with no evidence of progression to lymphoma, myeloma, or amyloidosis, awaiting today's additional lab tests. #3. Atrial fibrillation with controlled ventricular response, currently on Eliquis. #4. Diabetes mellitus, type II, insulin requiring, controlled. #5. Lower extremity skin cancer, no evidence of disease.   PLAN:  #1. No need for Aranesp today. #2. Continue anticoagulation. #3. Followup with lab tests and visit in 3 months. Told to call should increasing weakness occurs or if intermittent CBC shows you have a less than 11.   All questions were answered. The patient knows to call the clinic with any problems, questions or concerns. We can certainly see the patient much sooner if necessary.   I spent 25 minutes counseling the patient face to face. The total time spent in the appointment was 30 minutes.    Doroteo Bradford, MD 05/06/2013 3:23 PM

## 2013-05-06 NOTE — Patient Instructions (Signed)
..  [facility] Discharge Instructions  RECOMMENDATIONS MADE BY THE CONSULTANT AND ANY TEST RESULTS WILL BE SENT TO YOUR REFERRING PHYSICIAN.  EXAM FINDINGS BY THE PHYSICIAN TODAY AND SIGNS OR SYMPTOMS TO REPORT TO CLINIC OR PRIMARY PHYSICIAN: Exam and findings as discussed by Dr. Barnet Glasgow. Recommend a humidifier for your room MEDICATIONS PRESCRIBED:  No procit injection today. Hemoglobin 12.2  INSTRUCTIONS/FOLLOW-UP: 3 months  Thank you for choosing [facility] to provide your oncology and hematology care.  To afford each patient quality time with our providers, please arrive at least 15 minutes before your scheduled appointment time.  With your help, our goal is to use those 15 minutes to complete the necessary work-up to ensure our physicians have the information they need to help with your evaluation and healthcare recommendations.    Effective January 1st, 2014, we ask that you re-schedule your appointment with our physicians should you arrive 10 or more minutes late for your appointment.  We strive to give you quality time with our providers, and arriving late affects you and other patients whose appointments are after yours.    Again, thank you for choosing [facility].  Our hope is that these requests will decrease the amount of time that you wait before being seen by our physicians.       _____________________________________________________________  Should you have questions after your visit to [facility], please contact our office at [phone] between the hours of 8:30 a.m. and 5:00 p.m.  Voicemails left after 4:30 p.m. will not be returned until the following business day.  For prescription refill requests, have your pharmacy contact our office with your prescription refill request.

## 2013-05-06 NOTE — Progress Notes (Signed)
Catherine [name] presented for labwork. Labs per MD order drawn via Peripheral Line 25 gauge needle inserted in rt ac.  Good blood return present. Procedure without incident.  Needle removed intact. Patient tolerated procedure well.

## 2013-05-07 LAB — KAPPA/LAMBDA LIGHT CHAINS
KAPPA FREE LGHT CHN: 17.2 mg/dL — AB (ref 0.33–1.94)
Kappa, lambda light chain ratio: 3.8 — ABNORMAL HIGH (ref 0.26–1.65)
Lambda free light chains: 4.53 mg/dL — ABNORMAL HIGH (ref 0.57–2.63)

## 2013-05-07 LAB — BETA 2 MICROGLOBULIN, SERUM: Beta-2 Microglobulin: 6.9 mg/L — ABNORMAL HIGH (ref 1.01–1.73)

## 2013-05-07 LAB — FERRITIN: FERRITIN: 205 ng/mL (ref 10–291)

## 2013-05-10 LAB — PROTEIN ELECTROPHORESIS, SERUM
ALBUMIN ELP: 50 % — AB (ref 55.8–66.1)
Alpha-1-Globulin: 7.6 % — ABNORMAL HIGH (ref 2.9–4.9)
Alpha-2-Globulin: 12.3 % — ABNORMAL HIGH (ref 7.1–11.8)
Beta 2: 3.9 % (ref 3.2–6.5)
Beta Globulin: 5.4 % (ref 4.7–7.2)
Gamma Globulin: 20.8 % — ABNORMAL HIGH (ref 11.1–18.8)
M-Spike, %: NOT DETECTED g/dL
Total Protein ELP: 7 g/dL (ref 6.0–8.3)

## 2013-05-10 LAB — MULTIPLE MYELOMA PANEL, SERUM
ALPHA-1-GLOBULIN: 6.1 % — AB (ref 2.9–4.9)
ALPHA-2-GLOBULIN: 13.1 % — AB (ref 7.1–11.8)
Albumin ELP: 50.2 % — ABNORMAL LOW (ref 55.8–66.1)
Beta 2: 4.7 % (ref 3.2–6.5)
Beta Globulin: 4.9 % (ref 4.7–7.2)
GAMMA GLOBULIN: 21 % — AB (ref 11.1–18.8)
IGA: 274 mg/dL (ref 69–380)
IGG (IMMUNOGLOBIN G), SERUM: 1430 mg/dL (ref 690–1700)
IgM, Serum: 98 mg/dL (ref 52–322)
M-Spike, %: NOT DETECTED g/dL
TOTAL PROTEIN: 6.9 g/dL (ref 6.0–8.3)

## 2013-05-10 LAB — IMMUNOFIXATION ELECTROPHORESIS
IGM, SERUM: 91 mg/dL (ref 52–322)
IgA: 266 mg/dL (ref 69–380)
IgG (Immunoglobin G), Serum: 1380 mg/dL (ref 690–1700)
TOTAL PROTEIN ELP: 6.8 g/dL (ref 6.0–8.3)

## 2013-05-18 ENCOUNTER — Encounter (HOSPITAL_COMMUNITY): Payer: Self-pay | Admitting: Emergency Medicine

## 2013-05-18 ENCOUNTER — Other Ambulatory Visit: Payer: Self-pay

## 2013-05-18 ENCOUNTER — Emergency Department (HOSPITAL_COMMUNITY)
Admission: EM | Admit: 2013-05-18 | Discharge: 2013-05-18 | Disposition: A | Discharge status: Skilled Nursing Facility | Payer: Medicare Other | Attending: Emergency Medicine | Admitting: Emergency Medicine

## 2013-05-18 DIAGNOSIS — E119 Type 2 diabetes mellitus without complications: Secondary | ICD-10-CM | POA: Insufficient documentation

## 2013-05-18 DIAGNOSIS — Z853 Personal history of malignant neoplasm of breast: Secondary | ICD-10-CM | POA: Insufficient documentation

## 2013-05-18 DIAGNOSIS — Z79899 Other long term (current) drug therapy: Secondary | ICD-10-CM | POA: Insufficient documentation

## 2013-05-18 DIAGNOSIS — Z872 Personal history of diseases of the skin and subcutaneous tissue: Secondary | ICD-10-CM | POA: Insufficient documentation

## 2013-05-18 DIAGNOSIS — Z794 Long term (current) use of insulin: Secondary | ICD-10-CM | POA: Insufficient documentation

## 2013-05-18 DIAGNOSIS — Z880 Allergy status to penicillin: Secondary | ICD-10-CM | POA: Insufficient documentation

## 2013-05-18 DIAGNOSIS — I129 Hypertensive chronic kidney disease with stage 1 through stage 4 chronic kidney disease, or unspecified chronic kidney disease: Secondary | ICD-10-CM | POA: Insufficient documentation

## 2013-05-18 DIAGNOSIS — M810 Age-related osteoporosis without current pathological fracture: Secondary | ICD-10-CM | POA: Insufficient documentation

## 2013-05-18 DIAGNOSIS — D638 Anemia in other chronic diseases classified elsewhere: Secondary | ICD-10-CM | POA: Insufficient documentation

## 2013-05-18 DIAGNOSIS — I4891 Unspecified atrial fibrillation: Secondary | ICD-10-CM | POA: Insufficient documentation

## 2013-05-18 DIAGNOSIS — R319 Hematuria, unspecified: Secondary | ICD-10-CM | POA: Insufficient documentation

## 2013-05-18 DIAGNOSIS — I509 Heart failure, unspecified: Secondary | ICD-10-CM | POA: Insufficient documentation

## 2013-05-18 DIAGNOSIS — Z7901 Long term (current) use of anticoagulants: Secondary | ICD-10-CM | POA: Insufficient documentation

## 2013-05-18 DIAGNOSIS — J449 Chronic obstructive pulmonary disease, unspecified: Secondary | ICD-10-CM | POA: Insufficient documentation

## 2013-05-18 DIAGNOSIS — Z8673 Personal history of transient ischemic attack (TIA), and cerebral infarction without residual deficits: Secondary | ICD-10-CM | POA: Insufficient documentation

## 2013-05-18 DIAGNOSIS — N189 Chronic kidney disease, unspecified: Secondary | ICD-10-CM | POA: Insufficient documentation

## 2013-05-18 DIAGNOSIS — R609 Edema, unspecified: Secondary | ICD-10-CM | POA: Insufficient documentation

## 2013-05-18 DIAGNOSIS — Z85828 Personal history of other malignant neoplasm of skin: Secondary | ICD-10-CM | POA: Insufficient documentation

## 2013-05-18 DIAGNOSIS — J4489 Other specified chronic obstructive pulmonary disease: Secondary | ICD-10-CM | POA: Insufficient documentation

## 2013-05-18 LAB — BASIC METABOLIC PANEL
BUN: 61 mg/dL — ABNORMAL HIGH (ref 6–23)
CO2: 28 mEq/L (ref 19–32)
Calcium: 9.5 mg/dL (ref 8.4–10.5)
Chloride: 101 mEq/L (ref 96–112)
Creatinine, Ser: 1.92 mg/dL — ABNORMAL HIGH (ref 0.50–1.10)
GFR calc Af Amer: 26 mL/min — ABNORMAL LOW (ref >90)
GFR calc non Af Amer: 22 mL/min — ABNORMAL LOW (ref >90)
Glucose, Bld: 164 mg/dL — ABNORMAL HIGH (ref 70–99)
Potassium: 4.2 mEq/L (ref 3.7–5.3)
Sodium: 143 mEq/L (ref 137–147)

## 2013-05-18 LAB — CBC WITH DIFFERENTIAL/PLATELET
Basophils Absolute: 0 10*3/uL (ref 0.0–0.1)
Basophils Relative: 1 % (ref 0–1)
Eosinophils Absolute: 0.2 10*3/uL (ref 0.0–0.7)
Eosinophils Relative: 4 % (ref 0–5)
HCT: 35 % — ABNORMAL LOW (ref 36.0–46.0)
Hemoglobin: 11.2 g/dL — ABNORMAL LOW (ref 12.0–15.0)
Lymphocytes Relative: 13 % (ref 12–46)
Lymphs Abs: 0.6 10*3/uL — ABNORMAL LOW (ref 0.7–4.0)
MCH: 32.3 pg (ref 26.0–34.0)
MCHC: 32 g/dL (ref 30.0–36.0)
MCV: 100.9 fL — ABNORMAL HIGH (ref 78.0–100.0)
Monocytes Absolute: 0.6 10*3/uL (ref 0.1–1.0)
Monocytes Relative: 14 % — ABNORMAL HIGH (ref 3–12)
Neutro Abs: 3.1 10*3/uL (ref 1.7–7.7)
Neutrophils Relative %: 68 % (ref 43–77)
Platelets: 194 10*3/uL (ref 150–400)
RBC: 3.47 MIL/uL — ABNORMAL LOW (ref 3.87–5.11)
RDW: 15 % (ref 11.5–15.5)
WBC: 4.5 10*3/uL (ref 4.0–10.5)

## 2013-05-18 LAB — URINALYSIS, ROUTINE W REFLEX MICROSCOPIC
Bilirubin Urine: NEGATIVE
Glucose, UA: NEGATIVE mg/dL
Ketones, ur: NEGATIVE mg/dL
Nitrite: NEGATIVE
Specific Gravity, Urine: 1.02 (ref 1.005–1.030)
Urobilinogen, UA: 0.2 mg/dL (ref 0.0–1.0)
pH: 5.5 (ref 5.0–8.0)

## 2013-05-18 LAB — URINE MICROSCOPIC-ADD ON

## 2013-05-18 NOTE — ED Provider Notes (Signed)
CSN: [id number]     Arrival date & time 05/18/13  1309 History  This chart was scribed for Nat Christen, MD by Roxan Diesel, ED scribe.  This patient was seen in room APA05/APA05 and the patient's care was started at 2:23 PM.   Chief Complaint  Patient presents with  . Hematuria    The history is provided by the patient. No language interpreter was used.    HPI Comments: Catherine [name] is a 80 y.o. female who presents to the Emergency Department complaining of 3 days of persistent hematuria.  Pt states she notices blood in her urine.  She reports that it cleared up somewhat but then worsened again.  She states she otherwise feels well and denies nausea, loss of appetite, or new leg swelling.  Pt has had an indwelling catheter in place for some time.  She notes that she used to have it changed every 6 weeks but she feels it has been longer than that since she last had it changed.  Pt is able to ambulate only a small amount at baseline.   Past Medical History  Diagnosis Date  . CHF (congestive heart failure)   . Diabetes mellitus   . Hypertension   . COPD (chronic obstructive pulmonary disease)   . Vertigo   . Cancer     breast with mastectomy  . Stroke      patient denies  . Skin disorder      Epidermolysis bullosa.  . Pulmonary hypertension   . Atrial fibrillation 2011  . Multiple thyroid nodules   . Anemia   . Breast cancer   . Anemia of other chronic disease 06/09/2012  . Aortic stenosis   . Monoclonal gammopathy   . Hypothermia   . Renal disorder     CKD  . Diverticulosis   . Squamous cell carcinoma   . Osteoporosis, unspecified 02/24/2013    Past Surgical History  Procedure Laterality Date  . Cholecystectomy    . Hemorrhoid surgery    . Abdominal hysterectomy  1991    complete hysterectomy for cancer  . Mastectomy  2005    left breast, tamoxifin  . Goiter    . Hip fracture surgery  2010    right hip replacement  . Hip fracture surgery  2006    left hip  replacement  . Colonoscopy  ?    remote  . Cataract extraction, bilateral      Family History  Problem Relation Age of Onset  . Colon cancer Neg Hx   . Heart attack Son     age 44s, suspected MI  . Diabetes Mother     History  Substance Use Topics  . Smoking status: Never Smoker   . Smokeless tobacco: Never Used  . Alcohol Use: No    OB History   Grav Para Term Preterm Abortions TAB SAB Ect Mult Living                  Review of Systems A complete 10 system review of systems was obtained and all systems are negative except as noted in the HPI and PMH.    Allergies  Amoxicillin-pot clavulanate  Home Medications   Current Outpatient Rx  Name  Route  Sig  Dispense  Refill  . acetaminophen (TYLENOL) 325 MG tablet   Oral   Take 650 mg by mouth every 6 (six) hours as needed for pain.         Marland Kitchen albuterol (PROAIR  HFA) 108 (90 BASE) MCG/ACT inhaler   Inhalation   Inhale 2 puffs into the lungs every 4 (four) hours as needed for wheezing.         Marland Kitchen apixaban (ELIQUIS) 2.5 MG TABS tablet   Oral   Take 2.5 mg by mouth 2 (two) times daily.          Marland Kitchen atorvastatin (LIPITOR) 10 MG tablet   Oral   Take 10 mg by mouth at bedtime.         . Calcium 600 MG tablet   Oral   Take 2 tablets (1,200 mg total) by mouth daily.   60 tablet   5   . Cholecalciferol (VITAMIN D) 2000 UNITS CAPS   Oral   Take 1 capsule by mouth every morning.           . citalopram (CELEXA) 20 MG tablet   Oral   Take 20 mg by mouth daily.         . clonazePAM (KLONOPIN) 0.5 MG tablet   Oral   Take 0.5 mg by mouth 3 (three) times daily as needed for anxiety (agitatuion).         . cyanocobalamin (,VITAMIN B-12,) 1000 MCG/ML injection   Intramuscular   Inject 1,000 mcg into the muscle every 30 (thirty) days.          Marland Kitchen diltiazem (CARDIZEM CD) 240 MG 24 hr capsule   Oral   Take 240 mg by mouth daily.           . ferrous sulfate 325 (65 FE) MG tablet   Oral   Take 325 mg  by mouth daily with breakfast.         . LANTUS SOLOSTAR 100 UNIT/ML injection   Subcutaneous   Inject 12 Units into the skin daily.          Marland Kitchen loratadine (CLARITIN) 10 MG tablet   Oral   Take 10 mg by mouth daily.         . metoprolol (LOPRESSOR) 50 MG tablet   Oral   Take 50 mg by mouth 2 (two) times daily.         . mirtazapine (REMERON) 15 MG tablet   Oral   Take 7.5 mg by mouth at bedtime.         . mupirocin ointment (BACTROBAN) 2 %   Topical   Apply 1 application topically 2 (two) times daily.          . pantoprazole (PROTONIX) 40 MG tablet   Oral   Take 1 tablet (40 mg total) by mouth daily.   30 tablet   12   . polyethylene glycol (MIRALAX / GLYCOLAX) packet   Oral   Take 17 g by mouth daily as needed (constipation).         . potassium chloride SA (K-DUR,KLOR-CON) 20 MEQ tablet   Oral   Take 1 tablet (20 mEq total) by mouth daily.   90 tablet   3   . triamcinolone cream (KENALOG) 0.1 %   Topical   Apply 1 application topically daily. Apply to lower extremities once daily         . NOVOFINE AUTOCOVER 30G X 8 MM MISC                BP 105/72  Pulse 103  Temp(Src) 97.5 F (36.4 C) (Oral)  Resp 20  Ht 5\' 2"  (1.575 m)  Wt 147 lb (66.679 kg)  BMI  26.88 kg/m2  SpO2 92%  Physical Exam  Nursing note and vitals reviewed. Constitutional: She is oriented to person, place, and time. She appears well-developed and well-nourished.  HENT:  Head: Normocephalic and atraumatic.  Eyes: Conjunctivae and EOM are normal. Pupils are equal, round, and reactive to light.  Neck: Normal range of motion. Neck supple.  Cardiovascular: Normal rate, regular rhythm and normal heart sounds.   Pulmonary/Chest: Effort normal and breath sounds normal.  Abdominal: Soft. Bowel sounds are normal.  Musculoskeletal: She exhibits edema.  Lower extremities tense Bilateral venous stasis ulcers 3-4+ peripheral edema  Neurological: She is alert and oriented to  person, place, and time.  Skin: Skin is warm and dry.  Psychiatric: She has a normal mood and affect. Her behavior is normal.    ED Course  Procedures (including critical care time)  DIAGNOSTIC STUDIES: Oxygen Saturation is 92% on room air, low by my interpretation.    COORDINATION OF CARE: 2:27 PM-Discussed treatment plan which includes changing catheter and UA with pt at bedside and pt agreed to plan.    Labs Review Labs Reviewed  BASIC METABOLIC PANEL - Abnormal; Notable for the following:    Glucose, Bld 164 (*)    BUN 61 (*)    Creatinine, Ser 1.92 (*)    GFR calc non Af Amer 22 (*)    GFR calc Af Amer 26 (*)    All other components within normal limits  CBC WITH DIFFERENTIAL - Abnormal; Notable for the following:    RBC 3.47 (*)    Hemoglobin 11.2 (*)    HCT 35.0 (*)    MCV 100.9 (*)    Lymphs Abs 0.6 (*)    Monocytes Relative 14 (*)    All other components within normal limits  URINALYSIS, ROUTINE W REFLEX MICROSCOPIC - Abnormal; Notable for the following:    APPearance HAZY (*)    Hgb urine dipstick LARGE (*)    Protein, ur TRACE (*)    Leukocytes, UA SMALL (*)    All other components within normal limits  URINE MICROSCOPIC-ADD ON - Abnormal; Notable for the following:    Bacteria, UA FEW (*)    All other components within normal limits   Imaging Review No results found.  EKG Interpretation   None       MDM   1. Hematuria    Patient is nontoxic. Urinalysis shows hemoglobin, but no white cells. Will change catheter.  Followup with urologist     I personally performed the services described in this documentation, which was scribed in my presence. The recorded information has been reviewed and is accurate.    Nat Christen, MD 05/18/13 1606

## 2013-05-18 NOTE — ED Notes (Signed)
Blood in urine onset 4 days ago, states it cleared  Up some and then got worse.

## 2013-05-18 NOTE — Discharge Instructions (Signed)
Urinalysis shows no evidence of infection. Foley catheter was changed. Followup with your urologist.

## 2013-05-18 NOTE — ED Notes (Signed)
[facility] Home called. Patient waiting for transportation.

## 2013-06-10 ENCOUNTER — Emergency Department (HOSPITAL_COMMUNITY)
Admission: EM | Admit: 2013-06-10 | Discharge: 2013-06-10 | Disposition: A | Discharge status: Home / Self Care | Payer: Medicare Other | Attending: Emergency Medicine | Admitting: Emergency Medicine

## 2013-06-10 ENCOUNTER — Emergency Department (HOSPITAL_COMMUNITY): Payer: Medicare Other

## 2013-06-10 ENCOUNTER — Encounter (HOSPITAL_COMMUNITY): Payer: Self-pay | Admitting: Emergency Medicine

## 2013-06-10 DIAGNOSIS — Z880 Allergy status to penicillin: Secondary | ICD-10-CM | POA: Insufficient documentation

## 2013-06-10 DIAGNOSIS — Z9089 Acquired absence of other organs: Secondary | ICD-10-CM | POA: Insufficient documentation

## 2013-06-10 DIAGNOSIS — D631 Anemia in chronic kidney disease: Secondary | ICD-10-CM | POA: Insufficient documentation

## 2013-06-10 DIAGNOSIS — Z872 Personal history of diseases of the skin and subcutaneous tissue: Secondary | ICD-10-CM | POA: Insufficient documentation

## 2013-06-10 DIAGNOSIS — N039 Chronic nephritic syndrome with unspecified morphologic changes: Secondary | ICD-10-CM

## 2013-06-10 DIAGNOSIS — R635 Abnormal weight gain: Secondary | ICD-10-CM | POA: Insufficient documentation

## 2013-06-10 DIAGNOSIS — Z8719 Personal history of other diseases of the digestive system: Secondary | ICD-10-CM | POA: Insufficient documentation

## 2013-06-10 DIAGNOSIS — I129 Hypertensive chronic kidney disease with stage 1 through stage 4 chronic kidney disease, or unspecified chronic kidney disease: Secondary | ICD-10-CM | POA: Insufficient documentation

## 2013-06-10 DIAGNOSIS — Z90710 Acquired absence of both cervix and uterus: Secondary | ICD-10-CM | POA: Insufficient documentation

## 2013-06-10 DIAGNOSIS — I359 Nonrheumatic aortic valve disorder, unspecified: Secondary | ICD-10-CM | POA: Insufficient documentation

## 2013-06-10 DIAGNOSIS — K5900 Constipation, unspecified: Secondary | ICD-10-CM | POA: Insufficient documentation

## 2013-06-10 DIAGNOSIS — Z794 Long term (current) use of insulin: Secondary | ICD-10-CM | POA: Insufficient documentation

## 2013-06-10 DIAGNOSIS — Z8673 Personal history of transient ischemic attack (TIA), and cerebral infarction without residual deficits: Secondary | ICD-10-CM | POA: Insufficient documentation

## 2013-06-10 DIAGNOSIS — Z85828 Personal history of other malignant neoplasm of skin: Secondary | ICD-10-CM | POA: Insufficient documentation

## 2013-06-10 DIAGNOSIS — Z853 Personal history of malignant neoplasm of breast: Secondary | ICD-10-CM | POA: Insufficient documentation

## 2013-06-10 DIAGNOSIS — N189 Chronic kidney disease, unspecified: Secondary | ICD-10-CM | POA: Insufficient documentation

## 2013-06-10 DIAGNOSIS — E119 Type 2 diabetes mellitus without complications: Secondary | ICD-10-CM | POA: Insufficient documentation

## 2013-06-10 DIAGNOSIS — J449 Chronic obstructive pulmonary disease, unspecified: Secondary | ICD-10-CM | POA: Insufficient documentation

## 2013-06-10 DIAGNOSIS — Z7901 Long term (current) use of anticoagulants: Secondary | ICD-10-CM | POA: Insufficient documentation

## 2013-06-10 DIAGNOSIS — F0390 Unspecified dementia without behavioral disturbance: Secondary | ICD-10-CM | POA: Insufficient documentation

## 2013-06-10 DIAGNOSIS — M810 Age-related osteoporosis without current pathological fracture: Secondary | ICD-10-CM | POA: Insufficient documentation

## 2013-06-10 DIAGNOSIS — R609 Edema, unspecified: Secondary | ICD-10-CM | POA: Insufficient documentation

## 2013-06-10 DIAGNOSIS — IMO0002 Reserved for concepts with insufficient information to code with codable children: Secondary | ICD-10-CM | POA: Insufficient documentation

## 2013-06-10 DIAGNOSIS — J4489 Other specified chronic obstructive pulmonary disease: Secondary | ICD-10-CM | POA: Insufficient documentation

## 2013-06-10 DIAGNOSIS — Z9889 Other specified postprocedural states: Secondary | ICD-10-CM | POA: Insufficient documentation

## 2013-06-10 DIAGNOSIS — Z792 Long term (current) use of antibiotics: Secondary | ICD-10-CM | POA: Insufficient documentation

## 2013-06-10 DIAGNOSIS — I509 Heart failure, unspecified: Secondary | ICD-10-CM | POA: Insufficient documentation

## 2013-06-10 DIAGNOSIS — I4891 Unspecified atrial fibrillation: Secondary | ICD-10-CM | POA: Insufficient documentation

## 2013-06-10 DIAGNOSIS — Z79899 Other long term (current) drug therapy: Secondary | ICD-10-CM | POA: Insufficient documentation

## 2013-06-10 NOTE — ED Notes (Signed)
Manual disimpaction done by Dr. Lacinda Axon, followed by fleet enema also given by dr Lacinda Axon.

## 2013-06-10 NOTE — ED Provider Notes (Signed)
CSN: [id number]     Arrival date & time 06/10/13  1722 History   First MD Initiated Contact with Patient 06/10/13 1748     Chief Complaint  Patient presents with  . Rectal Pain     (Consider location/radiation/quality/duration/timing/severity/associated sxs/prior Treatment) HPI.... level V caveat for mild dementia. Patient complains of constipation and rectal pain for several days.  Also noted is small weight gain. No vomiting, diarrhea, fever, chills, dysuria. Severity is mild   Past Medical History  Diagnosis Date  . CHF (congestive heart failure)   . Diabetes mellitus   . Hypertension   . COPD (chronic obstructive pulmonary disease)   . Vertigo   . Cancer     breast with mastectomy  . Stroke      patient denies  . Skin disorder      Epidermolysis bullosa.  . Pulmonary hypertension   . Atrial fibrillation 2011  . Multiple thyroid nodules   . Anemia   . Breast cancer   . Anemia of other chronic disease 06/09/2012  . Aortic stenosis   . Monoclonal gammopathy   . Hypothermia   . Renal disorder     CKD  . Diverticulosis   . Squamous cell carcinoma   . Osteoporosis, unspecified 02/24/2013   Past Surgical History  Procedure Laterality Date  . Cholecystectomy    . Hemorrhoid surgery    . Abdominal hysterectomy  1991    complete hysterectomy for cancer  . Mastectomy  2005    left breast, tamoxifin  . Goiter    . Hip fracture surgery  2010    right hip replacement  . Hip fracture surgery  2006    left hip replacement  . Colonoscopy  ?    remote  . Cataract extraction, bilateral     Family History  Problem Relation Age of Onset  . Colon cancer Neg Hx   . Heart attack Son     age 51s, suspected MI  . Diabetes Mother    History  Substance Use Topics  . Smoking status: Never Smoker   . Smokeless tobacco: Never Used  . Alcohol Use: No   OB History   Grav Para Term Preterm Abortions TAB SAB Ect Mult Living                 Review of Systems  Unable to  perform ROS: Dementia      Allergies  Amoxicillin-pot clavulanate  Home Medications   Current Outpatient Rx  Name  Route  Sig  Dispense  Refill  . acetaminophen (TYLENOL) 325 MG tablet   Oral   Take 650 mg by mouth every 6 (six) hours as needed for pain.         Marland Kitchen albuterol (PROAIR HFA) 108 (90 BASE) MCG/ACT inhaler   Inhalation   Inhale 2 puffs into the lungs every 4 (four) hours as needed for wheezing.         Marland Kitchen apixaban (ELIQUIS) 2.5 MG TABS tablet   Oral   Take 2.5 mg by mouth 2 (two) times daily.          Marland Kitchen atorvastatin (LIPITOR) 10 MG tablet   Oral   Take 10 mg by mouth at bedtime.         . Calcium 600 MG tablet   Oral   Take 2 tablets (1,200 mg total) by mouth daily.   60 tablet   5   . Cholecalciferol (VITAMIN D) 2000 UNITS CAPS   Oral  Take 1 capsule by mouth every morning.           . citalopram (CELEXA) 20 MG tablet   Oral   Take 20 mg by mouth daily.         . clonazePAM (KLONOPIN) 0.5 MG tablet   Oral   Take 0.5 mg by mouth 3 (three) times daily as needed for anxiety (agitatuion).         . cyanocobalamin (,VITAMIN B-12,) 1000 MCG/ML injection   Intramuscular   Inject 1,000 mcg into the muscle every 30 (thirty) days.          Marland Kitchen diltiazem (CARDIZEM CD) 240 MG 24 hr capsule   Oral   Take 240 mg by mouth daily.           . ferrous sulfate 325 (65 FE) MG tablet   Oral   Take 325 mg by mouth daily with breakfast.         . LANTUS SOLOSTAR 100 UNIT/ML injection   Subcutaneous   Inject 12 Units into the skin daily.          Marland Kitchen loratadine (CLARITIN) 10 MG tablet   Oral   Take 10 mg by mouth daily.         . metoprolol (LOPRESSOR) 50 MG tablet   Oral   Take 50 mg by mouth 2 (two) times daily.         . mirtazapine (REMERON) 15 MG tablet   Oral   Take 7.5 mg by mouth at bedtime.         . mupirocin ointment (BACTROBAN) 2 %   Topical   Apply 1 application topically 2 (two) times daily.          .  pantoprazole (PROTONIX) 40 MG tablet   Oral   Take 1 tablet (40 mg total) by mouth daily.   30 tablet   12   . polyethylene glycol (MIRALAX / GLYCOLAX) packet   Oral   Take 17 g by mouth daily as needed (constipation).         . potassium chloride SA (K-DUR,KLOR-CON) 20 MEQ tablet   Oral   Take 1 tablet (20 mEq total) by mouth daily.   90 tablet   3   . triamcinolone cream (KENALOG) 0.1 %   Topical   Apply 1 application topically daily. Apply to lower extremities once daily          BP 113/74  Pulse 96  Temp(Src) 97.6 F (36.4 C) (Oral)  Resp 18  Ht 5\' 2"  (1.575 m)  Wt 165 lb (74.844 kg)  BMI 30.17 kg/m2  SpO2 98% Physical Exam  Nursing note and vitals reviewed. Constitutional: She is oriented to person, place, and time. She appears well-developed and well-nourished.  HENT:  Head: Normocephalic and atraumatic.  Eyes: Conjunctivae and EOM are normal. Pupils are equal, round, and reactive to light.  Neck: Normal range of motion. Neck supple.  Cardiovascular: Normal rate, regular rhythm and normal heart sounds.   Pulmonary/Chest: Effort normal and breath sounds normal.  Abdominal: Soft. Bowel sounds are normal.  Abdomen nontender and no obvious distention  Genitourinary:  Rectal exam reveals large amount of stool in rectal vault  Musculoskeletal: Normal range of motion.  Neurological: She is alert and oriented to person, place, and time.  Skin:  2+ peripheral edema. Multiple superficial scabs and ulcerations (old)  Psychiatric: She has a normal mood and affect. Her behavior is normal.    ED Course  Procedures (including critical care time).........Marland KitchenPROCEDURE:    Digital disimpaction performed by examiner. Large amount of stool extracted from rectum. Stool is non-mucousy and nonbloody. Fleets enema also administered. Labs Review Labs Reviewed - No data to display Imaging Review Dg Abd Acute W/chest  06/10/2013   CLINICAL DATA:  80 year old female with shortness  of breath, constipation, rectal pain. Recent weight gain. Initial encounter.  EXAM: ACUTE ABDOMEN SERIES (ABDOMEN 2 VIEW & CHEST 1 VIEW)  COMPARISON:  Chest radiographs 12/10/2012 and earlier. Abdomen radiographs 12/05/2012 and earlier. Chest CT 10/06/2012. CT Abdomen and Pelvis 12/13/2010.  FINDINGS: Upright AP view of the chest. Interval improved ventilation. Stable cardiomegaly and mediastinal contours. No pneumothorax or pulmonary edema. No pneumoperitoneum.  Non obstructed bowel gas pattern. Gray hazy opacity throughout the abdomen suggesting ascites. Right upper quadrant surgical clips. Extensive aortoiliac and other abdominal calcified atherosclerosis. Bilateral hip arthroplasties. Stable visualized osseous structures.  IMPRESSION: 1.  Non obstructed bowel gas pattern, no free air. 2. Ascites. 3.  No acute cardiopulmonary abnormality.   Electronically Signed   By: Lars Pinks M.D.   On: 06/10/2013 19:55    EKG Interpretation   None       MDM   Final diagnoses:  Constipation    Patient's chief complaint is constipation. Digital disimpaction performed by examiner. Patient feels better after procedure. No acute abdomen.    Nat Christen, MD 06/12/13 [id number]

## 2013-06-10 NOTE — ED Notes (Signed)
Pt here from [facility] for evaluation of rectal pain, constipation, SOB and weight gain of 15 lbs

## 2013-06-10 NOTE — Discharge Instructions (Signed)
Constipation, Adult Constipation is when a person:  Poops (bowel movement) less than 3 times a week.  Has a hard time pooping.  Has poop that is dry, hard, or bigger than normal. HOME CARE   Eat more fiber, such as fruits, vegetables, whole grains like brown rice, and beans.  Eat less fatty foods and sugar. This includes [place] fries, hamburgers, cookies, candy, and soda.  If you are not getting enough fiber from food, take products with added fiber in them (supplements).  Drink enough fluid to keep your pee (urine) clear or pale yellow.  Go to the restroom when you feel like you need to poop. Do not hold it.  Only take medicine as told by your doctor. Do not take medicines that help you poop (laxatives) without talking to your doctor first.  Exercise on a regular basis, or as told by your doctor. GET HELP RIGHT AWAY IF:   You have bright red blood in your poop (stool).  Your constipation lasts more than 4 days or gets worse.  You have belly (abdomen) or butt (rectal) pain.  You have thin poop (as thin as a pencil).  You lose weight, and it cannot be explained. MAKE SURE YOU:   Understand these instructions.  Will watch your condition.  Will get help right away if you are not doing well or get worse. Document Released: 10/02/2007 Document Revised: 07/08/2011 Document Reviewed: 01/25/2013 [facility] Patient Information 2014 [place], [place].  I performed a digital disimpaction.  Also gave the patient a fleets enema.  Followup your Dr.

## 2013-06-14 ENCOUNTER — Other Ambulatory Visit: Payer: Self-pay | Admitting: Physician Assistant

## 2013-06-14 DIAGNOSIS — I4891 Unspecified atrial fibrillation: Secondary | ICD-10-CM

## 2013-06-14 DIAGNOSIS — I509 Heart failure, unspecified: Secondary | ICD-10-CM

## 2013-06-14 DIAGNOSIS — I10 Essential (primary) hypertension: Secondary | ICD-10-CM

## 2013-06-14 LAB — BASIC METABOLIC PANEL
BUN: 46 mg/dL — ABNORMAL HIGH (ref 6–23)
CO2: 29 mEq/L (ref 19–32)
Calcium: 8.9 mg/dL (ref 8.4–10.5)
Chloride: 103 mEq/L (ref 96–112)
Creat: 1.84 mg/dL — ABNORMAL HIGH (ref 0.50–1.10)
Glucose, Bld: 95 mg/dL (ref 70–99)
Potassium: 3.2 mEq/L — ABNORMAL LOW (ref 3.5–5.3)
SODIUM: 144 meq/L (ref 135–145)

## 2013-06-14 LAB — BRAIN NATRIURETIC PEPTIDE: BRAIN NATRIURETIC PEPTIDE: 768.9 pg/mL — AB (ref 0.0–100.0)

## 2013-06-16 ENCOUNTER — Telehealth: Payer: Self-pay | Admitting: *Deleted

## 2013-06-16 NOTE — Telephone Encounter (Signed)
With last visit with KL, NP she reduced to Potassium 20 Meq daily  She is not on 60 mg BID. Only 20 Meq daily. Please advise

## 2013-06-16 NOTE — Telephone Encounter (Signed)
With last visit with KL, NP she reduced to Potassium 20 Meq daily

## 2013-06-16 NOTE — Telephone Encounter (Signed)
Sorry had some confusion. Yes will put her on potassium 40 meq in am and 20 meq in the pm

## 2013-06-16 NOTE — Telephone Encounter (Signed)
Please advise 

## 2013-06-16 NOTE — Telephone Encounter (Signed)
Is patient taking Lasix 60mg  BID? If so, potassium 31meq in am 45meq in pm

## 2013-06-16 NOTE — Telephone Encounter (Signed)
Can not result on result note. Pt is only taking 20 Meq daily of potassium, your order is to take 80 Meq per day. Is this correct?

## 2013-06-16 NOTE — Telephone Encounter (Signed)
If she is on Lasix 60mg  BID she should take Potassium 44meq am 58meq pm

## 2013-06-16 NOTE — Telephone Encounter (Signed)
According to [facility] home pt is only taking Lasix 60 mg once a day

## 2013-06-17 ENCOUNTER — Encounter: Payer: Self-pay | Admitting: *Deleted

## 2013-06-17 MED ORDER — FUROSEMIDE 40 MG PO TABS: 60.0000 mg | ORAL_TABLET | Freq: Every day | ORAL | Status: DC

## 2013-06-17 MED ORDER — POTASSIUM CHLORIDE CRYS ER 20 MEQ PO TBCR: 40.0000 meq | EXTENDED_RELEASE_TABLET | Freq: Every day | ORAL | Status: DC

## 2013-06-17 NOTE — Addendum Note (Signed)
Addended by: Truett Mainland on: 06/17/2013 02:33 PM   Modules accepted: Orders

## 2013-06-17 NOTE — Telephone Encounter (Signed)
Increased Potassium to 40 Meq daily per Dr Bronson Ing

## 2013-06-17 NOTE — Telephone Encounter (Signed)
Awaiting ML response, see note from 2/18

## 2013-06-21 ENCOUNTER — Emergency Department (HOSPITAL_COMMUNITY): Payer: Medicare Other

## 2013-06-21 ENCOUNTER — Inpatient Hospital Stay (HOSPITAL_COMMUNITY)
Admission: EM | Admit: 2013-06-21 | Discharge: 2013-06-26 | DRG: 292 | Disposition: A | Discharge status: Home Health | Payer: Medicare Other | Attending: Internal Medicine | Admitting: Internal Medicine

## 2013-06-21 ENCOUNTER — Encounter (HOSPITAL_COMMUNITY): Payer: Self-pay | Admitting: Emergency Medicine

## 2013-06-21 DIAGNOSIS — Z66 Do not resuscitate: Secondary | ICD-10-CM | POA: Diagnosis present

## 2013-06-21 DIAGNOSIS — I4891 Unspecified atrial fibrillation: Secondary | ICD-10-CM | POA: Diagnosis present

## 2013-06-21 DIAGNOSIS — Z96649 Presence of unspecified artificial hip joint: Secondary | ICD-10-CM

## 2013-06-21 DIAGNOSIS — Z9010 Acquired absence of unspecified breast and nipple: Secondary | ICD-10-CM

## 2013-06-21 DIAGNOSIS — F329 Major depressive disorder, single episode, unspecified: Secondary | ICD-10-CM | POA: Diagnosis present

## 2013-06-21 DIAGNOSIS — Z8673 Personal history of transient ischemic attack (TIA), and cerebral infarction without residual deficits: Secondary | ICD-10-CM

## 2013-06-21 DIAGNOSIS — J9610 Chronic respiratory failure, unspecified whether with hypoxia or hypercapnia: Secondary | ICD-10-CM | POA: Diagnosis present

## 2013-06-21 DIAGNOSIS — G729 Myopathy, unspecified: Secondary | ICD-10-CM | POA: Diagnosis present

## 2013-06-21 DIAGNOSIS — I2789 Other specified pulmonary heart diseases: Secondary | ICD-10-CM | POA: Diagnosis present

## 2013-06-21 DIAGNOSIS — D638 Anemia in other chronic diseases classified elsewhere: Secondary | ICD-10-CM | POA: Diagnosis present

## 2013-06-21 DIAGNOSIS — Z853 Personal history of malignant neoplasm of breast: Secondary | ICD-10-CM

## 2013-06-21 DIAGNOSIS — Q828 Other specified congenital malformations of skin: Secondary | ICD-10-CM

## 2013-06-21 DIAGNOSIS — D472 Monoclonal gammopathy: Secondary | ICD-10-CM | POA: Diagnosis present

## 2013-06-21 DIAGNOSIS — Z8249 Family history of ischemic heart disease and other diseases of the circulatory system: Secondary | ICD-10-CM

## 2013-06-21 DIAGNOSIS — F3289 Other specified depressive episodes: Secondary | ICD-10-CM | POA: Diagnosis present

## 2013-06-21 DIAGNOSIS — Z960 Presence of urogenital implants: Secondary | ICD-10-CM

## 2013-06-21 DIAGNOSIS — I129 Hypertensive chronic kidney disease with stage 1 through stage 4 chronic kidney disease, or unspecified chronic kidney disease: Secondary | ICD-10-CM | POA: Diagnosis present

## 2013-06-21 DIAGNOSIS — I2729 Other secondary pulmonary hypertension: Secondary | ICD-10-CM

## 2013-06-21 DIAGNOSIS — I10 Essential (primary) hypertension: Secondary | ICD-10-CM

## 2013-06-21 DIAGNOSIS — E119 Type 2 diabetes mellitus without complications: Secondary | ICD-10-CM | POA: Diagnosis present

## 2013-06-21 DIAGNOSIS — M810 Age-related osteoporosis without current pathological fracture: Secondary | ICD-10-CM | POA: Diagnosis present

## 2013-06-21 DIAGNOSIS — Z978 Presence of other specified devices: Secondary | ICD-10-CM

## 2013-06-21 DIAGNOSIS — L280 Lichen simplex chronicus: Secondary | ICD-10-CM

## 2013-06-21 DIAGNOSIS — I50810 Right heart failure, unspecified: Secondary | ICD-10-CM

## 2013-06-21 DIAGNOSIS — J9611 Chronic respiratory failure with hypoxia: Secondary | ICD-10-CM | POA: Diagnosis present

## 2013-06-21 DIAGNOSIS — J449 Chronic obstructive pulmonary disease, unspecified: Secondary | ICD-10-CM

## 2013-06-21 DIAGNOSIS — I5033 Acute on chronic diastolic (congestive) heart failure: Principal | ICD-10-CM | POA: Diagnosis present

## 2013-06-21 DIAGNOSIS — N289 Disorder of kidney and ureter, unspecified: Secondary | ICD-10-CM

## 2013-06-21 DIAGNOSIS — J4489 Other specified chronic obstructive pulmonary disease: Secondary | ICD-10-CM | POA: Diagnosis present

## 2013-06-21 DIAGNOSIS — I2781 Cor pulmonale (chronic): Secondary | ICD-10-CM | POA: Diagnosis present

## 2013-06-21 DIAGNOSIS — Z9981 Dependence on supplemental oxygen: Secondary | ICD-10-CM

## 2013-06-21 DIAGNOSIS — N183 Chronic kidney disease, stage 3 unspecified: Secondary | ICD-10-CM | POA: Diagnosis present

## 2013-06-21 DIAGNOSIS — Z794 Long term (current) use of insulin: Secondary | ICD-10-CM

## 2013-06-21 DIAGNOSIS — I509 Heart failure, unspecified: Secondary | ICD-10-CM | POA: Diagnosis present

## 2013-06-21 DIAGNOSIS — F411 Generalized anxiety disorder: Secondary | ICD-10-CM | POA: Diagnosis present

## 2013-06-21 DIAGNOSIS — D649 Anemia, unspecified: Secondary | ICD-10-CM

## 2013-06-21 DIAGNOSIS — Z833 Family history of diabetes mellitus: Secondary | ICD-10-CM

## 2013-06-21 DIAGNOSIS — R601 Generalized edema: Secondary | ICD-10-CM

## 2013-06-21 DIAGNOSIS — N184 Chronic kidney disease, stage 4 (severe): Secondary | ICD-10-CM

## 2013-06-21 LAB — TROPONIN I
Troponin I: <0.3 ng/mL (ref <0.30)
Troponin I: <0.3 ng/mL (ref <0.30)

## 2013-06-21 LAB — CBC WITH DIFFERENTIAL/PLATELET
BASOS PCT: 1 % (ref 0–1)
Basophils Absolute: 0 10*3/uL (ref 0.0–0.1)
Eosinophils Absolute: 0.2 10*3/uL (ref 0.0–0.7)
Eosinophils Relative: 4 % (ref 0–5)
HEMATOCRIT: 35.3 % — AB (ref 36.0–46.0)
Hemoglobin: 11.1 g/dL — ABNORMAL LOW (ref 12.0–15.0)
LYMPHS PCT: 10 % — AB (ref 12–46)
Lymphs Abs: 0.5 10*3/uL — ABNORMAL LOW (ref 0.7–4.0)
MCH: 32.9 pg (ref 26.0–34.0)
MCHC: 31.4 g/dL (ref 30.0–36.0)
MCV: 104.7 fL — ABNORMAL HIGH (ref 78.0–100.0)
MONOS PCT: 13 % — AB (ref 3–12)
Monocytes Absolute: 0.7 10*3/uL (ref 0.1–1.0)
NEUTROS ABS: 3.7 10*3/uL (ref 1.7–7.7)
NEUTROS PCT: 73 % (ref 43–77)
Platelets: 208 10*3/uL (ref 150–400)
RBC: 3.37 MIL/uL — AB (ref 3.87–5.11)
RDW: 15.9 % — ABNORMAL HIGH (ref 11.5–15.5)
WBC: 5.1 10*3/uL (ref 4.0–10.5)

## 2013-06-21 LAB — PRO B NATRIURETIC PEPTIDE: Pro B Natriuretic peptide (BNP): 9750 pg/mL — ABNORMAL HIGH (ref 0–450)

## 2013-06-21 LAB — COMPREHENSIVE METABOLIC PANEL
ALBUMIN: 3.2 g/dL — AB (ref 3.5–5.2)
ALK PHOS: 88 U/L (ref 39–117)
ALT: 6 U/L (ref 0–35)
AST: 15 U/L (ref 0–37)
BUN: 54 mg/dL — ABNORMAL HIGH (ref 6–23)
CO2: 31 meq/L (ref 19–32)
Calcium: 8.9 mg/dL (ref 8.4–10.5)
Chloride: 101 mEq/L (ref 96–112)
Creatinine, Ser: 2.11 mg/dL — ABNORMAL HIGH (ref 0.50–1.10)
GFR calc Af Amer: 23 mL/min — ABNORMAL LOW (ref >90)
GFR, EST NON AFRICAN AMERICAN: 20 mL/min — AB (ref >90)
Glucose, Bld: 151 mg/dL — ABNORMAL HIGH (ref 70–99)
POTASSIUM: 5 meq/L (ref 3.7–5.3)
Sodium: 141 mEq/L (ref 137–147)
Total Bilirubin: 0.5 mg/dL (ref 0.3–1.2)
Total Protein: 7.2 g/dL (ref 6.0–8.3)

## 2013-06-21 MED ORDER — CYANOCOBALAMIN 1000 MCG/ML IJ SOLN
1000.0000 ug | INTRAMUSCULAR | Status: DC
  Administered 2013-06-22: 1000 ug via INTRAMUSCULAR
  Filled 2013-06-21: qty 1

## 2013-06-21 MED ORDER — POTASSIUM CHLORIDE CRYS ER 20 MEQ PO TBCR
20.0000 meq | EXTENDED_RELEASE_TABLET | Freq: Every day | ORAL | Status: DC
  Administered 2013-06-22: 20 meq via ORAL
  Filled 2013-06-21: qty 1

## 2013-06-21 MED ORDER — NITROGLYCERIN 2 % TD OINT
1.0000 [in_us] | TOPICAL_OINTMENT | Freq: Once | TRANSDERMAL | Status: AC
  Administered 2013-06-21: 1 [in_us] via TOPICAL
  Filled 2013-06-21: qty 1

## 2013-06-21 MED ORDER — SODIUM CHLORIDE 0.9 % IJ SOLN: 3.0000 mL | INTRAMUSCULAR | Status: DC | PRN

## 2013-06-21 MED ORDER — ATORVASTATIN CALCIUM 10 MG PO TABS
10.0000 mg | ORAL_TABLET | Freq: Every day | ORAL | Status: DC
  Administered 2013-06-22 – 2013-06-25 (×4): 10 mg via ORAL
  Filled 2013-06-21 (×4): qty 1

## 2013-06-21 MED ORDER — METOPROLOL TARTRATE 50 MG PO TABS
50.0000 mg | ORAL_TABLET | Freq: Two times a day (BID) | ORAL | Status: DC
  Administered 2013-06-21 – 2013-06-25 (×7): 50 mg via ORAL
  Filled 2013-06-21 (×7): qty 1

## 2013-06-21 MED ORDER — FERROUS SULFATE 325 (65 FE) MG PO TABS
325.0000 mg | ORAL_TABLET | Freq: Every day | ORAL | Status: DC
  Administered 2013-06-22 – 2013-06-26 (×5): 325 mg via ORAL
  Filled 2013-06-21 (×5): qty 1

## 2013-06-21 MED ORDER — SODIUM CHLORIDE 0.9 % IJ SOLN
3.0000 mL | Freq: Two times a day (BID) | INTRAMUSCULAR | Status: DC
  Administered 2013-06-21 – 2013-06-25 (×8): 3 mL via INTRAVENOUS

## 2013-06-21 MED ORDER — FUROSEMIDE 10 MG/ML IJ SOLN
60.0000 mg | Freq: Two times a day (BID) | INTRAMUSCULAR | Status: AC
  Administered 2013-06-21 – 2013-06-22 (×2): 60 mg via INTRAVENOUS
  Filled 2013-06-21 (×3): qty 6

## 2013-06-21 MED ORDER — SODIUM CHLORIDE 0.9 % IV SOLN: 250.0000 mL | INTRAVENOUS | Status: DC | PRN

## 2013-06-21 MED ORDER — CALCIUM CARBONATE 1250 (500 CA) MG PO TABS
500.0000 mg | ORAL_TABLET | Freq: Two times a day (BID) | ORAL | Status: DC
  Administered 2013-06-21 – 2013-06-26 (×10): 500 mg via ORAL
  Filled 2013-06-21 (×10): qty 1

## 2013-06-21 MED ORDER — RIVAROXABAN 15 MG PO TABS
15.0000 mg | ORAL_TABLET | Freq: Every day | ORAL | Status: DC
  Administered 2013-06-21: 15 mg via ORAL
  Filled 2013-06-21: qty 1

## 2013-06-21 MED ORDER — APIXABAN 5 MG PO TABS: 2.5000 mg | ORAL_TABLET | Freq: Two times a day (BID) | ORAL | Status: DC

## 2013-06-21 MED ORDER — CLONAZEPAM 0.5 MG PO TABS: 0.5000 mg | ORAL_TABLET | Freq: Three times a day (TID) | ORAL | Status: DC | PRN

## 2013-06-21 MED ORDER — POLYETHYLENE GLYCOL 3350 17 G PO PACK: 17.0000 g | PACK | Freq: Every day | ORAL | Status: DC | PRN

## 2013-06-21 MED ORDER — MIRTAZAPINE 30 MG PO TABS
15.0000 mg | ORAL_TABLET | Freq: Every day | ORAL | Status: DC
  Administered 2013-06-21: 15 mg via ORAL
  Filled 2013-06-21: qty 1

## 2013-06-21 MED ORDER — DILTIAZEM HCL ER COATED BEADS 240 MG PO CP24
240.0000 mg | ORAL_CAPSULE | Freq: Every day | ORAL | Status: DC
  Administered 2013-06-22 – 2013-06-26 (×5): 240 mg via ORAL
  Filled 2013-06-21 (×7): qty 1

## 2013-06-21 MED ORDER — PANTOPRAZOLE SODIUM 40 MG PO TBEC
40.0000 mg | DELAYED_RELEASE_TABLET | Freq: Every day | ORAL | Status: DC
  Administered 2013-06-22 – 2013-06-26 (×5): 40 mg via ORAL
  Filled 2013-06-21 (×5): qty 1

## 2013-06-21 MED ORDER — ALBUTEROL SULFATE (2.5 MG/3ML) 0.083% IN NEBU
3.0000 mL | INHALATION_SOLUTION | Freq: Four times a day (QID) | RESPIRATORY_TRACT | Status: DC | PRN
  Administered 2013-06-22 – 2013-06-25 (×2): 3 mL via RESPIRATORY_TRACT
  Filled 2013-06-21 (×2): qty 3

## 2013-06-21 MED ORDER — MUPIROCIN 2 % EX OINT
1.0000 "application " | TOPICAL_OINTMENT | Freq: Two times a day (BID) | CUTANEOUS | Status: DC
  Administered 2013-06-21 – 2013-06-26 (×10): 1 via NASAL
  Filled 2013-06-21 (×3): qty 22

## 2013-06-21 MED ORDER — INSULIN GLARGINE 100 UNIT/ML ~~LOC~~ SOLN
12.0000 [IU] | Freq: Every day | SUBCUTANEOUS | Status: DC
  Administered 2013-06-22 – 2013-06-26 (×4): 12 [IU] via SUBCUTANEOUS
  Filled 2013-06-21 (×7): qty 0.12

## 2013-06-21 MED ORDER — CITALOPRAM HYDROBROMIDE 20 MG PO TABS
20.0000 mg | ORAL_TABLET | Freq: Every day | ORAL | Status: DC
  Administered 2013-06-22 – 2013-06-26 (×5): 20 mg via ORAL
  Filled 2013-06-21 (×5): qty 1

## 2013-06-21 MED ORDER — CALCIUM CARBONATE 600 MG PO TABS: 600.0000 mg | ORAL_TABLET | Freq: Every day | ORAL | Status: DC

## 2013-06-21 MED ORDER — FUROSEMIDE 10 MG/ML IJ SOLN
60.0000 mg | Freq: Once | INTRAMUSCULAR | Status: AC
  Administered 2013-06-21: 60 mg via INTRAVENOUS
  Filled 2013-06-21: qty 6

## 2013-06-21 MED ORDER — VITAMIN D 1000 UNITS PO TABS
2000.0000 [IU] | ORAL_TABLET | Freq: Every day | ORAL | Status: DC
  Administered 2013-06-22 – 2013-06-26 (×5): 2000 [IU] via ORAL
  Filled 2013-06-21 (×5): qty 2

## 2013-06-21 MED ORDER — LORATADINE 10 MG PO TABS
10.0000 mg | ORAL_TABLET | Freq: Every day | ORAL | Status: DC
  Administered 2013-06-22: 10 mg via ORAL
  Filled 2013-06-21: qty 1

## 2013-06-21 NOTE — ED Notes (Signed)
Patient requesting supper tray. Meal tray is being heated in microwave and will be given to patient

## 2013-06-21 NOTE — ED Notes (Signed)
Patient with chronic foley catheter. Was in Dr Silvano Rusk office today for routine catheter change. Leg bag replaced with standard drainage bag by RN due to lasix injection.

## 2013-06-21 NOTE — ED Notes (Signed)
Patient sent for further evaluation of abdominal distension and wounds. Patient denies abdominal pain, denies chest pain. Patient is short of breath, which is normal for patient. Patient resident at [facility].

## 2013-06-21 NOTE — ED Provider Notes (Signed)
CSN: [id number]     Arrival date & time 06/21/13  1546 History  This chart was scribed for Catherine Norrie, MD by Elby Beck, ED Scribe. This patient was seen in room APA18/APA18 and the patient's care was started at 4:12 PM.  Chief Complaint  Patient presents with  . Bloated    The history is provided by the patient. No language interpreter was used.    HPI Comments: Catherine [name] is a 80 y.o. female with a history of DM, CHF, HTN, breast CA with mastectomy and stroke who presents to the Emergency Department complaining of abdominal distention, which she states has been going on for "quite some time". She states that she has had 2 soft BMs today, although she reports that she has not been having BMs for up to 2 days at a time in the past week, and she suspects she may be constipated. She was seen in the ED on 06/10/13 and had a fecal d isimpaction performed by Dr. Lacinda Axon. She states that she has a chronic indwelling catheter in her bladder because "my bladder doesn't empty all the way". Pt states that her urologist was concerned about this bloating when he saw her earlier today when she had her catheter changed and he had her come straight to the ED. She states that she has been a resident at [facility] for about 1 year. She states that she ambulates with a wheelchair and sometimes with a walker at baseline. She states that she has been having swelling in her bilateral arms and legs, as well as some SOB. She states that she is Lasix which she believes is helpful. She denies any abdominal pain, chest pain,nausea, vomiting or any other symptoms. She denies smoking and denies drinking.   PCP- Dr. Asencion Noble Cardiologist- Seen at [place], [facility] Dr Michela Pitcher   Past Medical History  Diagnosis Date  . CHF (congestive heart failure)   . Diabetes mellitus   . Hypertension   . COPD (chronic obstructive pulmonary disease)   . Vertigo   . Cancer     breast with mastectomy  . Stroke       patient denies  . Skin disorder      Epidermolysis bullosa.  . Pulmonary hypertension   . Atrial fibrillation 2011  . Multiple thyroid nodules   . Anemia   . Breast cancer   . Anemia of other chronic disease 06/09/2012  . Aortic stenosis   . Monoclonal gammopathy   . Hypothermia   . Renal disorder     CKD  . Diverticulosis   . Squamous cell carcinoma   . Osteoporosis, unspecified 02/24/2013   Past Surgical History  Procedure Laterality Date  . Cholecystectomy    . Hemorrhoid surgery    . Abdominal hysterectomy  1991    complete hysterectomy for cancer  . Mastectomy  2005    left breast, tamoxifin  . Goiter    . Hip fracture surgery  2010    right hip replacement  . Hip fracture surgery  2006    left hip replacement  . Colonoscopy  ?    remote  . Cataract extraction, bilateral     Family History  Problem Relation Age of Onset  . Colon cancer Neg Hx   . Heart attack Son     age 71s, suspected MI  . Diabetes Mother    History  Substance Use Topics  . Smoking status: Never Smoker   .  Smokeless tobacco: Never Used  . Alcohol Use: No   Lives in a nursing home Uses a walker and wheelchair  OB History   Grav Para Term Preterm Abortions TAB SAB Ect Mult Living                 Review of Systems  Respiratory: Positive for shortness of breath (baseline).   Cardiovascular: Positive for leg swelling (baseline).  Gastrointestinal: Positive for constipation and abdominal distention. Negative for nausea, vomiting and abdominal pain.  All other systems reviewed and are negative.   Allergies  Amoxicillin-pot clavulanate  Home Medications   Patient's Medications  New Prescriptions   No medications on file  Previous Medications   ACETAMINOPHEN (TYLENOL) 325 MG TABLET    Take 650 mg by mouth every 6 (six) hours as needed.   ALBUTEROL (PROVENTIL HFA;VENTOLIN HFA) 108 (90 BASE) MCG/ACT INHALER    Inhale 2 puffs into the lungs every 6 (six) hours as needed for wheezing  or shortness of breath.   APIXABAN (ELIQUIS) 2.5 MG TABS TABLET    Take 2.5 mg by mouth 2 (two) times daily.   ATORVASTATIN (LIPITOR) 10 MG TABLET    Take 10 mg by mouth at bedtime.   CALCIUM CARBONATE (OS-CAL) 600 MG TABS TABLET    Take 600 mg by mouth daily with breakfast.   CHOLECALCIFEROL (VITAMIN D) 1000 UNITS TABLET    Take 2,000 Units by mouth daily.   CIPROFLOXACIN (CIPRO) 250 MG TABLET    Take 250 mg by mouth 2 (two) times daily.   CITALOPRAM (CELEXA) 20 MG TABLET    Take 20 mg by mouth daily.   CLONAZEPAM (KLONOPIN) 0.5 MG TABLET    Take 0.5 mg by mouth 3 (three) times daily as needed for anxiety.   CYANOCOBALAMIN (,VITAMIN B-12,) 1000 MCG/ML INJECTION    Inject 1,000 mcg into the muscle every 30 (thirty) days.   DILTIAZEM (DILACOR XR) 240 MG 24 HR CAPSULE    Take 240 mg by mouth daily.   FERROUS SULFATE 325 (65 FE) MG TABLET    Take 325 mg by mouth daily with breakfast.   FLUOCINONIDE CREAM (LIDEX) 0.05 %    Apply 1 application topically daily.   FUROSEMIDE (LASIX) 20 MG TABLET    Take 60 mg by mouth 2 (two) times daily.   INSULIN GLARGINE (LANTUS) 100 UNIT/ML INJECTION    Inject 12 Units into the skin daily.   LORATADINE (CLARITIN) 10 MG TABLET    Take 10 mg by mouth daily.   METOPROLOL (LOPRESSOR) 50 MG TABLET    Take 50 mg by mouth 2 (two) times daily.   MIRTAZAPINE (REMERON) 15 MG TABLET    Take 15 mg by mouth at bedtime.   MUPIROCIN OINTMENT (BACTROBAN) 2 %    Place 1 application into the nose 2 (two) times daily.   OYSTER SHELL (OYSTER CALCIUM) 500 MG TABS TABLET    Take 500 mg of elemental calcium by mouth 2 (two) times daily.   PANTOPRAZOLE (PROTONIX) 40 MG TABLET    Take 40 mg by mouth daily.   POLYETHYLENE GLYCOL (MIRALAX / GLYCOLAX) PACKET    Take 17 g by mouth daily as needed for mild constipation.   POTASSIUM CHLORIDE SA (K-DUR,KLOR-CON) 20 MEQ TABLET    Take 20 mEq by mouth daily.   RIVAROXABAN (XARELTO) 15 MG TABS TABLET    Take 15 mg by mouth daily with supper.    TRIAMCINOLONE CREAM (KENALOG) 0.1 %  Apply 1 application topically daily.   POTASSIUM CHLORIDE SA (K-DUR,KLOR-CON) 20 MEQ TABLET    Take 2 tablets (40 mEq total) by mouth daily.   SENNA (SENOKOT) 8.6 MG TABLET    Take 2 tablets by mouth at bedtime.   TRAMADOL (ULTRAM) 50 MG TABLET    Take 50 mg by mouth daily with supper.   TRIAMCINOLONE CREAM (KENALOG) 0.1 %    Apply 1 application topically daily. Apply to lower extremities once daily   Triage Vitals: BP 123/72  Pulse 93  Temp(Src) 97.3 F (36.3 C) (Oral)  Resp 17  Wt 166 lb (75.297 kg)  SpO2 98%  Vital signs normal    Physical Exam  Nursing note and vitals reviewed. Constitutional: She is oriented to person, place, and time. She appears well-developed and well-nourished.  Non-toxic appearance. She does not appear ill. No distress.  HENT:  Head: Normocephalic and atraumatic.  Right Ear: External ear normal.  Left Ear: External ear normal.  Nose: Nose normal. No mucosal edema or rhinorrhea.  Mouth/Throat: Oropharynx is clear and moist and mucous membranes are normal. No dental abscesses or uvula swelling.  Eyes: Conjunctivae and EOM are normal. Pupils are equal, round, and reactive to light.  Neck: Normal range of motion and full passive range of motion without pain. Neck supple.  Cardiovascular: Normal rate and regular rhythm.  Exam reveals no gallop and no friction rub.   Murmur heard. Harsh systolic murmur, heard best at the lower left sternal border.  Pulmonary/Chest: Effort normal and breath sounds normal. No respiratory distress. She has no wheezes. She has no rhonchi. She has no rales. She exhibits no tenderness and no crepitus.  Abdominal: Soft. Normal appearance and bowel sounds are normal. She exhibits distension. There is no tenderness. There is no rebound and no guarding.  Musculoskeletal: Normal range of motion. She exhibits no tenderness.  Diffuse swelling of bilateral legs and arms. Moves all extremities well.    Neurological: She is alert and oriented to person, place, and time. She has normal strength. No cranial nerve deficit.  Skin: Skin is warm, dry and intact. No erythema. No pallor.  Scattered superficial ulcer type lesions- about the size of quarters- on bilateral thighs and abdomen, Consistent with her history of Epidermolysis bullosa.  Psychiatric: She has a normal mood and affect. Her speech is normal and behavior is normal. Her mood appears not anxious.    ED Course  Procedures (including critical care time)  Medications  furosemide (LASIX) injection 60 mg (60 mg Intravenous Given 06/21/13 1638)  nitroGLYCERIN (NITROGLYN) 2 % ointment 1 inch (1 inch Topical Given 06/21/13 1636)   DIAGNOSTIC STUDIES: Oxygen Saturation is 98% on RA, normal by my interpretation.    COORDINATION OF CARE: 4:19 PM- Discussed plan to order diagnostic lab work and radiology. Will also order Lasix and Nitroglycerin. Pt advised of plan for treatment and pt agrees.  17:35 pt given her test results c/w exacerbation of CHF, is agreeable to be admitted for further diuresis  18:02 Dr Sarajane Jews, admit to tele, attending Dr Willey Blade   Results for orders placed during the hospital encounter of 06/21/13  CBC WITH DIFFERENTIAL      Result Value Ref Range   WBC 5.1  4.0 - 10.5 K/uL   RBC 3.37 (*) 3.87 - 5.11 MIL/uL   Hemoglobin 11.1 (*) 12.0 - 15.0 g/dL   HCT 35.3 (*) 36.0 - 46.0 %   MCV 104.7 (*) 78.0 - 100.0 fL   MCH 32.9  26.0 - 34.0 pg   MCHC 31.4  30.0 - 36.0 g/dL   RDW 15.9 (*) 11.5 - 15.5 %   Platelets 208  150 - 400 K/uL   Neutrophils Relative % 73  43 - 77 %   Neutro Abs 3.7  1.7 - 7.7 K/uL   Lymphocytes Relative 10 (*) 12 - 46 %   Lymphs Abs 0.5 (*) 0.7 - 4.0 K/uL   Monocytes Relative 13 (*) 3 - 12 %   Monocytes Absolute 0.7  0.1 - 1.0 K/uL   Eosinophils Relative 4  0 - 5 %   Eosinophils Absolute 0.2  0.0 - 0.7 K/uL   Basophils Relative 1  0 - 1 %   Basophils Absolute 0.0  0.0 - 0.1 K/uL   COMPREHENSIVE METABOLIC PANEL      Result Value Ref Range   Sodium 141  137 - 147 mEq/L   Potassium 5.0  3.7 - 5.3 mEq/L   Chloride 101  96 - 112 mEq/L   CO2 31  19 - 32 mEq/L   Glucose, Bld 151 (*) 70 - 99 mg/dL   BUN 54 (*) 6 - 23 mg/dL   Creatinine, Ser 2.11 (*) 0.50 - 1.10 mg/dL   Calcium 8.9  8.4 - 10.5 mg/dL   Total Protein 7.2  6.0 - 8.3 g/dL   Albumin 3.2 (*) 3.5 - 5.2 g/dL   AST 15  0 - 37 U/L   ALT 6  0 - 35 U/L   Alkaline Phosphatase 88  39 - 117 U/L   Total Bilirubin 0.5  0.3 - 1.2 mg/dL   GFR calc non Af Amer 20 (*) >90 mL/min   GFR calc Af Amer 23 (*) >90 mL/min  PRO B NATRIURETIC PEPTIDE      Result Value Ref Range   Pro B Natriuretic peptide (BNP) 9750.0 (*) 0 - 450 pg/mL  TROPONIN I      Result Value Ref Range   Troponin I <0.30  <0.30 ng/mL   Laboratory interpretation all normal except marked elevation of her BMP compare to her prior values. Mild anemia, stable renal insufficiency, hyperglycemia   Dg Chest Portable 1 View  06/21/2013   CLINICAL DATA:  Shortness of breath.  EXAM: PORTABLE CHEST - 1 VIEW  COMPARISON:  06/10/2013  FINDINGS: 1629 hrs. Low volume lordotic film. Vascular congestion with new diffuse interstitial opacity is compatible with interstitial pulmonary edema. There is basilar atelectasis. The cardio pericardial silhouette is enlarged. Bones are diffusely demineralized. Telemetry leads overlie the chest.  IMPRESSION: Low lung volumes with vascular congestion and interstitial pulmonary edema.   Electronically Signed   By: Misty Stanley M.D.   On: 06/21/2013 16:42   EKG Interpretation    Date/Time:  Monday June 21 2013 17:29:58 EST Ventricular Rate:  89 PR Interval:    QRS Duration: 122 QT Interval:  414 QTC Calculation: 503 R Axis:   122 Text Interpretation:  Atrial fibrillation Right bundle branch block Possible Lateral infarct , age undetermined When compared with ECG of 18-May-2013 13:52, No significant change since last tracing  Confirmed by Nariya Neumeyer  MD-I, Jevante Hollibaugh ([id number]) on 06/21/2013 7:35:37 PM            MDM  patient presents with anasarca with swelling of her arms, legs, and abdomen. She also reports shortness of breath without chest pain. She has a history of an aortic stenosis with congestive heart failure. She is being admitted for further diuresis    Final  diagnoses:  CHF (congestive heart failure)  Anasarca  Renal insufficiency  Anemia    Plan admission   I personally performed the services described in this documentation, which was scribed in my presence. The recorded information has been reviewed and considered.  Rolland Porter, MD, FACEP   Catherine Norrie, MD 06/21/13 [phone]

## 2013-06-21 NOTE — H&P (Signed)
PCP:   Asencion Noble, MD   Chief Complaint:  swelling  HPI: 80 yo female went to urology today to get her chronic foley cath changed when noted she was more swollen than usual.  Pt states her swelling has been worse in the last couple of days, abd bloated arms swollen which is new, some worse sob.  No cough.  No fevers.  Taking her meds as prescribed.  On 2 L oxygen at assisted living.  No cp.  No abd pain.  Several bm over the last day, normal in caliber.   Reports she has a skin disorder also, where she gets these sores all the time.  Review of Systems:  Positive and negative as per HPI otherwise all other systems are negative  Past Medical History: Past Medical History  Diagnosis Date  . CHF (congestive heart failure)   . Diabetes mellitus   . Hypertension   . COPD (chronic obstructive pulmonary disease)   . Vertigo   . Cancer     breast with mastectomy  . Stroke      patient denies  . Skin disorder      Epidermolysis bullosa.  . Pulmonary hypertension   . Atrial fibrillation 2011  . Multiple thyroid nodules   . Anemia   . Breast cancer   . Anemia of other chronic disease 06/09/2012  . Aortic stenosis   . Monoclonal gammopathy   . Hypothermia   . Renal disorder     CKD  . Diverticulosis   . Squamous cell carcinoma   . Osteoporosis, unspecified 02/24/2013   Past Surgical History  Procedure Laterality Date  . Cholecystectomy    . Hemorrhoid surgery    . Abdominal hysterectomy  1991    complete hysterectomy for cancer  . Mastectomy  2005    left breast, tamoxifin  . Goiter    . Hip fracture surgery  2010    right hip replacement  . Hip fracture surgery  2006    left hip replacement  . Colonoscopy  ?    remote  . Cataract extraction, bilateral      Medications: Prior to Admission medications   Medication Sig Start Date End Date Taking? Authorizing Provider  acetaminophen (TYLENOL) 325 MG tablet Take 650 mg by mouth every 6 (six) hours as needed.   Yes  Historical Provider, MD  albuterol (PROVENTIL HFA;VENTOLIN HFA) 108 (90 BASE) MCG/ACT inhaler Inhale 2 puffs into the lungs every 6 (six) hours as needed for wheezing or shortness of breath.   Yes Historical Provider, MD  apixaban (ELIQUIS) 2.5 MG TABS tablet Take 2.5 mg by mouth 2 (two) times daily.   Yes Historical Provider, MD  calcium carbonate (OS-CAL) 600 MG TABS tablet Take 600 mg by mouth daily with breakfast.   Yes Historical Provider, MD  cholecalciferol (VITAMIN D) 1000 UNITS tablet Take 2,000 Units by mouth daily.   Yes Historical Provider, MD  ciprofloxacin (CIPRO) 250 MG tablet Take 250 mg by mouth 2 (two) times daily.   Yes Historical Provider, MD  citalopram (CELEXA) 20 MG tablet Take 20 mg by mouth daily.   Yes Historical Provider, MD  clonazePAM (KLONOPIN) 0.5 MG tablet Take 0.5 mg by mouth 3 (three) times daily as needed for anxiety.   Yes Historical Provider, MD  cyanocobalamin (,VITAMIN B-12,) 1000 MCG/ML injection Inject 1,000 mcg into the muscle every 30 (thirty) days.   Yes Historical Provider, MD  diltiazem (DILACOR XR) 240 MG 24 hr capsule Take 240 mg  by mouth daily.   Yes Historical Provider, MD  ferrous sulfate 325 (65 FE) MG tablet Take 325 mg by mouth daily with breakfast.   Yes Historical Provider, MD  fluocinonide cream (LIDEX) 2.59 % Apply 1 application topically daily.   Yes Historical Provider, MD  furosemide (LASIX) 20 MG tablet Take 60 mg by mouth 2 (two) times daily.   Yes Historical Provider, MD  insulin glargine (LANTUS) 100 UNIT/ML injection Inject 12 Units into the skin daily.   Yes Historical Provider, MD  loratadine (CLARITIN) 10 MG tablet Take 10 mg by mouth daily.   Yes Historical Provider, MD  metoprolol (LOPRESSOR) 50 MG tablet Take 50 mg by mouth 2 (two) times daily.   Yes Historical Provider, MD  mirtazapine (REMERON) 15 MG tablet Take 15 mg by mouth at bedtime.   Yes Historical Provider, MD  mupirocin ointment (BACTROBAN) 2 % Place 1 application  into the nose 2 (two) times daily.   Yes Historical Provider, MD  [place] (OYSTER CALCIUM) 500 MG TABS tablet Take 500 mg of elemental calcium by mouth 2 (two) times daily.   Yes Historical Provider, MD  pantoprazole (PROTONIX) 40 MG tablet Take 40 mg by mouth daily.   Yes Historical Provider, MD  polyethylene glycol (MIRALAX / GLYCOLAX) packet Take 17 g by mouth daily as needed for mild constipation.   Yes Historical Provider, MD  potassium chloride SA (K-DUR,KLOR-CON) 20 MEQ tablet Take 20 mEq by mouth daily.   Yes Historical Provider, MD  Rivaroxaban (XARELTO) 15 MG TABS tablet Take 15 mg by mouth daily with supper.   Yes Historical Provider, MD  triamcinolone cream (KENALOG) 0.1 % Apply 1 application topically daily.   Yes Historical Provider, MD  atorvastatin (LIPITOR) 10 MG tablet Take 10 mg by mouth at bedtime.    Historical Provider, MD    Allergies:   Allergies  Allergen Reactions  . Amoxicillin-Pot Clavulanate Diarrhea    Social History:  reports that she has never smoked. She has never used smokeless tobacco. She reports that she does not drink alcohol or use illicit drugs.  Family History: Family History  Problem Relation Age of Onset  . Colon cancer Neg Hx   . Heart attack Son     age 41s, suspected MI  . Diabetes Mother     Physical Exam: Filed Vitals:   06/21/13 1611 06/21/13 1700 06/21/13 1800 06/21/13 1900  BP: 123/72 109/72 116/68 113/72  Pulse: 93 92 94 95  Temp: 97.3 F (36.3 C)     TempSrc: Oral     Resp: 17 22 16 16   Weight: 75.297 kg (166 lb)     SpO2: 98%      General appearance: alert, cooperative and no distress Head: Normocephalic, without obvious abnormality, atraumatic Eyes: negative Nose: Nares normal. Septum midline. Mucosa normal. No drainage or sinus tenderness. Neck: no JVD and supple, symmetrical, trachea midline Lungs: clear to auscultation bilaterally Heart: regular rate and rhythm  SEM present  No r/g Abdomen: soft,  non-tender; bowel sounds normal; no masses,  no organomegaly Extremities: edema 4+ ble up to thighs, swelling in bue not as bad Pulses: 2+ and symmetric Skin: skin breakdown on legs, sores dry on chest bue Neurologic: Grossly normal    Labs on Admission:   Recent Labs  06/21/13 1620  NA 141  K 5.0  CL 101  CO2 31  GLUCOSE 151*  BUN 54*  CREATININE 2.11*  CALCIUM 8.9    Recent Labs  06/21/13 1620  AST 15  ALT 6  ALKPHOS 88  BILITOT 0.5  PROT 7.2  ALBUMIN 3.2*    Recent Labs  06/21/13 1620  WBC 5.1  NEUTROABS 3.7  HGB 11.1*  HCT 35.3*  MCV 104.7*  PLT 208    Recent Labs  06/21/13 1620  TROPONINI <0.30    Radiological Exams on Admission: Dg Chest Portable 1 View  06/21/2013   CLINICAL DATA:  Shortness of breath.  EXAM: PORTABLE CHEST - 1 VIEW  COMPARISON:  06/10/2013  FINDINGS: 1629 hrs. Low volume lordotic film. Vascular congestion with new diffuse interstitial opacity is compatible with interstitial pulmonary edema. There is basilar atelectasis. The cardio pericardial silhouette is enlarged. Bones are diffusely demineralized. Telemetry leads overlie the chest.  IMPRESSION: Low lung volumes with vascular congestion and interstitial pulmonary edema.   Electronically Signed   By: Misty Stanley M.D.   On: 06/21/2013 16:42   Dg Abd Acute W/chest  06/10/2013   CLINICAL DATA:  80 year old female with shortness of breath, constipation, rectal pain. Recent weight gain. Initial encounter.  EXAM: ACUTE ABDOMEN SERIES (ABDOMEN 2 VIEW & CHEST 1 VIEW)  COMPARISON:  Chest radiographs 12/10/2012 and earlier. Abdomen radiographs 12/05/2012 and earlier. Chest CT 10/06/2012. CT Abdomen and Pelvis 12/13/2010.  FINDINGS: Upright AP view of the chest. Interval improved ventilation. Stable cardiomegaly and mediastinal contours. No pneumothorax or pulmonary edema. No pneumoperitoneum.  Non obstructed bowel gas pattern. Gray hazy opacity throughout the abdomen suggesting ascites. Right  upper quadrant surgical clips. Extensive aortoiliac and other abdominal calcified atherosclerosis. Bilateral hip arthroplasties. Stable visualized osseous structures.  IMPRESSION: 1.  Non obstructed bowel gas pattern, no free air. 2. Ascites. 3.  No acute cardiopulmonary abnormality.   Electronically Signed   By: Lars Pinks M.D.   On: 06/10/2013 19:55    Assessment/Plan  80 yo female with anasarca with multiple medical problems  Principal Problem:   Diastolic CHF, acute on chronic-  Lasix 60 mg iv bid, ck echo in am.  chf pathway.  Volume overloaded significantly at this time.  Also with little worse ckd, monitor closely.    Active Problems:   Anemia of other chronic disease  stable   Atrial fibrillation  stable   Chronic kidney disease (CKD) stage G4/A1, severely decreased glomerular filtration rate (GFR) between 15-29 mL/min/1.73 square meter and albuminuria creatinine ratio less than 30 mg/g   Chronic cor pulmonale  stable   Chronic respiratory failure  stable   MGUS (monoclonal gammopathy of unknown significance)  stable   Neurodermatitis  Stable, think she also having some skin breakdown from all of the edema   Chronic indwelling Foley catheter  stable   Chronic respiratory failure with hypoxia  At baseline, stable  DNR wishes no cpr or intubation ever in future, pcp is dr Willey Blade.    Lumina Gitto A 06/21/2013, 7:47 PM

## 2013-06-21 NOTE — ED Notes (Signed)
Patient repositioned and moved up in bed.

## 2013-06-22 DIAGNOSIS — J9610 Chronic respiratory failure, unspecified whether with hypoxia or hypercapnia: Secondary | ICD-10-CM

## 2013-06-22 DIAGNOSIS — R609 Edema, unspecified: Secondary | ICD-10-CM

## 2013-06-22 DIAGNOSIS — N184 Chronic kidney disease, stage 4 (severe): Secondary | ICD-10-CM

## 2013-06-22 DIAGNOSIS — D472 Monoclonal gammopathy: Secondary | ICD-10-CM

## 2013-06-22 DIAGNOSIS — I4891 Unspecified atrial fibrillation: Secondary | ICD-10-CM

## 2013-06-22 DIAGNOSIS — E119 Type 2 diabetes mellitus without complications: Secondary | ICD-10-CM

## 2013-06-22 DIAGNOSIS — I5033 Acute on chronic diastolic (congestive) heart failure: Principal | ICD-10-CM

## 2013-06-22 DIAGNOSIS — D649 Anemia, unspecified: Secondary | ICD-10-CM

## 2013-06-22 DIAGNOSIS — I279 Pulmonary heart disease, unspecified: Secondary | ICD-10-CM

## 2013-06-22 DIAGNOSIS — R0902 Hypoxemia: Secondary | ICD-10-CM

## 2013-06-22 DIAGNOSIS — Z9889 Other specified postprocedural states: Secondary | ICD-10-CM

## 2013-06-22 DIAGNOSIS — I10 Essential (primary) hypertension: Secondary | ICD-10-CM

## 2013-06-22 DIAGNOSIS — I2789 Other specified pulmonary heart diseases: Secondary | ICD-10-CM

## 2013-06-22 DIAGNOSIS — I509 Heart failure, unspecified: Secondary | ICD-10-CM

## 2013-06-22 LAB — BASIC METABOLIC PANEL
BUN: 50 mg/dL — ABNORMAL HIGH (ref 6–23)
CALCIUM: 8.6 mg/dL (ref 8.4–10.5)
CO2: 30 mEq/L (ref 19–32)
CREATININE: 1.9 mg/dL — AB (ref 0.50–1.10)
Chloride: 102 mEq/L (ref 96–112)
GFR, EST AFRICAN AMERICAN: 26 mL/min — AB (ref >90)
GFR, EST NON AFRICAN AMERICAN: 23 mL/min — AB (ref >90)
GLUCOSE: 139 mg/dL — AB (ref 70–99)
Potassium: 3.8 mEq/L (ref 3.7–5.3)
Sodium: 143 mEq/L (ref 137–147)

## 2013-06-22 LAB — MRSA PCR SCREENING: MRSA by PCR: POSITIVE — AB

## 2013-06-22 LAB — TROPONIN I: Troponin I: <0.3 ng/mL (ref <0.30)

## 2013-06-22 MED ORDER — RIVAROXABAN 15 MG PO TABS: 15.0000 mg | ORAL_TABLET | Freq: Every day | ORAL | Status: DC

## 2013-06-22 MED ORDER — APIXABAN 5 MG PO TABS: 2.5000 mg | ORAL_TABLET | Freq: Two times a day (BID) | ORAL | Status: DC

## 2013-06-22 MED ORDER — FUROSEMIDE 10 MG/ML IJ SOLN
60.0000 mg | Freq: Two times a day (BID) | INTRAMUSCULAR | Status: DC
Start: 2013-06-22 — End: 2013-06-25
  Administered 2013-06-22 – 2013-06-24 (×5): 60 mg via INTRAVENOUS
  Filled 2013-06-22 (×6): qty 6

## 2013-06-22 MED ORDER — LORATADINE 10 MG PO TABS
10.0000 mg | ORAL_TABLET | Freq: Every day | ORAL | Status: DC | PRN
Start: 2013-06-22 — End: 2013-06-23

## 2013-06-22 MED ORDER — MIRTAZAPINE 15 MG PO TABS
7.5000 mg | ORAL_TABLET | Freq: Every day | ORAL | Status: DC
  Administered 2013-06-22 – 2013-06-25 (×4): 7.5 mg via ORAL
  Filled 2013-06-22: qty 1
  Filled 2013-06-22 (×5): qty 0.5

## 2013-06-22 MED ORDER — FUROSEMIDE 10 MG/ML IJ SOLN
40.0000 mg | Freq: Once | INTRAMUSCULAR | Status: AC
  Administered 2013-06-22: 40 mg via INTRAVENOUS

## 2013-06-22 MED ORDER — CHLORHEXIDINE GLUCONATE CLOTH 2 % EX PADS
6.0000 | MEDICATED_PAD | Freq: Every day | CUTANEOUS | Status: DC
Start: 2013-06-23 — End: 2013-06-26
  Administered 2013-06-23 – 2013-06-26 (×4): 6 via TOPICAL

## 2013-06-22 MED ORDER — POTASSIUM CHLORIDE CRYS ER 20 MEQ PO TBCR
40.0000 meq | EXTENDED_RELEASE_TABLET | Freq: Every day | ORAL | Status: DC
  Administered 2013-06-23 – 2013-06-26 (×4): 40 meq via ORAL
  Filled 2013-06-22 (×4): qty 2

## 2013-06-22 MED ORDER — RIVAROXABAN 15 MG PO TABS
15.0000 mg | ORAL_TABLET | Freq: Every day | ORAL | Status: DC
  Administered 2013-06-22 – 2013-06-25 (×4): 15 mg via ORAL
  Filled 2013-06-22 (×4): qty 1

## 2013-06-22 NOTE — Progress Notes (Signed)
UR chart review completed.  

## 2013-06-22 NOTE — Consult Note (Signed)
CARDIOLOGY CONSULT NOTE   Patient ID: Catherine [name] MRN: [id number] DOB/AGE: October 23, 1923 80 y.o.  Admit Date: 06/21/2013 Referring Physician: Asencion Noble Primary Physician: Asencion Noble, MD Consulting Cardiologist: [name] Primary Cardiologist: New Reason for Consultation: Diastolic CHF, Atrial fib  Clinical Summary Catherine [name] is a 80 y.o.female with known history of multiple medical problems to include diastolic CHF, COPD, CVA, pulmonary hypertension, atrial fib, hypertension, anemia, admitted for acute on chronic diastolic CHF,anasarca, CKD with 20 lb wt gain. She was seen by Dr. Freda Munro to have F/C changed and was sent to ER due to symptoms.     She has been having swelling over the last 2 days with abdominal distention and worsening shortness of breath.   She states that the weight gain occurred over 2-3 weeks with bloating of the arms. She is a resident of [facility] and is weighed daily. She noticed the weight gain but did not seek medical attention until symptoms worsened. She denies increased salt intake.    On arrival to ER wt was 167 lbs, Pro-BNP 9750, CXR vascular congestion and interstitial pulmonary edema. She was treated with IV lasix 60 mg X 1 and placed on NTG paste. She is continued on 60 mg IV BID. Creatinine was elevated at 2.11, potassium 5.0. EKG showed atrial fibrillation RBBB. Due to elevation of creatinine she was taken off of Xarelto 15 mg daily by Dr. Willey Blade and started on apixaban 2.5 mg BID. She has diuresed approx 2 liters since admission.     She is feeling some better but remains edematous and short of breath.    Allergies  Allergen Reactions  . Amoxicillin-Pot Clavulanate Diarrhea    Medications Scheduled Medications: . apixaban  2.5 mg Oral BID  . atorvastatin  10 mg Oral QHS  . calcium carbonate  500 mg of elemental calcium Oral BID  . cholecalciferol  2,000 Units Oral Daily  . citalopram  20 mg Oral Daily  . cyanocobalamin  1,000 mcg  Intramuscular Q30 days  . diltiazem  240 mg Oral Daily  . ferrous sulfate  325 mg Oral Q breakfast  . insulin glargine  12 Units Subcutaneous Daily  . metoprolol  50 mg Oral BID  . mirtazapine  7.5 mg Oral QHS  . mupirocin ointment  1 application Nasal BID  . pantoprazole  40 mg Oral Daily  . [START ON 06/23/2013] potassium chloride SA  40 mEq Oral Daily  . sodium chloride  3 mL Intravenous Q12H       PRN Medications: sodium chloride, albuterol, clonazePAM, loratadine, polyethylene glycol, sodium chloride   Past Medical History  Diagnosis Date  . CHF (congestive heart failure)   . Diabetes mellitus   . Hypertension   . COPD (chronic obstructive pulmonary disease)   . Vertigo   . Cancer     breast with mastectomy  . Stroke      patient denies  . Skin disorder      Epidermolysis bullosa.  . Pulmonary hypertension   . Atrial fibrillation 2011  . Multiple thyroid nodules   . Anemia   . Breast cancer   . Anemia of other chronic disease 06/09/2012  . Aortic stenosis   . Monoclonal gammopathy   . Hypothermia   . Renal disorder     CKD  . Diverticulosis   . Squamous cell carcinoma   . Osteoporosis, unspecified 02/24/2013    Past Surgical History  Procedure Laterality Date  . Cholecystectomy    .  Hemorrhoid surgery    . Abdominal hysterectomy  1991    complete hysterectomy for cancer  . Mastectomy  2005    left breast, tamoxifin  . Goiter    . Hip fracture surgery  2010    right hip replacement  . Hip fracture surgery  2006    left hip replacement  . Colonoscopy  ?    remote  . Cataract extraction, bilateral      Family History  Problem Relation Age of Onset  . Colon cancer Neg Hx   . Heart attack Son     age 58s, suspected MI  . Diabetes Mother     Social History Catherine [name] reports that she has never smoked. She has never used smokeless tobacco. Catherine [name] reports that she does not drink alcohol.  Review of Systems Otherwise reviewed and negative except  as outlined.  Physical Examination Blood pressure 106/68, pulse 77, temperature 97.7 F (36.5 C), temperature source Oral, resp. rate 20, height 5\' 2"  (1.575 m), weight 164 lb 10.9 oz (74.7 kg), SpO2 93.00%.  Intake/Output Summary (Last 24 hours) at 06/22/13 1109 Last data filed at 06/22/13 0935  Gross per 24 hour  Intake    120 ml  Output   2100 ml  Net  -1980 ml    Telemetry: Atrial fib with RBBB rates in the  80's.  HEENT: Conjunctiva and lids normal, oropharynx clear with moist mucosa. Neck: Supple, elevated JVP, no carotid bruits, no thyromegaly. Lungs: Bilateral rales, wheezes and is easily dyspneac with speaking or moving the bed.  Cardiac: Iregular rate and rhythm, 2/6 holosystolic murmur, no pericardial rub. Abdomen: Distendedt, nontender, no hepatomegaly, bowel sounds present, no guarding or rebound. Extremities:Generailzed pitting edema, distal pulses 2+. Skin: Warm and dry. Musculoskeletal: No kyphosis. Neuropsychiatric: Alert and oriented x3, affect grossly appropriate.  Prior Cardiac Testing/Procedures 1. Echocardiogram 10/06/2012 Left ventricle: The cavity size was moderately reduced. There was mild to moderate concentric hypertrophy. Systolic function was normal. The estimated ejection fraction was in the range of 55% to 60%. Wall motion was normal; there were no regional wall motion abnormalities. - Ventricular septum: Septal motion showed dyssynergy. The contour showed diastolic flattening. - Aortic valve: Mildly to moderately calcified annulus. Trileaflet; mildly thickened leaflets. - Mitral valve: Calcified annulus. Mildly thickened, mildly calcified leaflets . Mild regurgitation. - Left atrium: The atrium was mildly to moderately dilated. - Right ventricle: The cavity size was moderately dilated. Wall thickness was normal. Systolic function was mildly to moderately reduced. - Right atrium: The atrium was moderately dilated. - Atrial septum: No defect or  patent foramen ovale was identified. - Tricuspid valve: Mild-moderate regurgitation. - Pulmonary arteries: Systolic pressure was moderately increased. PA peak pressure: 46mm Hg (S). - Pericardium, extracardiac: There was a small right pleural effusion.   Lab Results  Basic Metabolic Panel:  Recent Labs Lab 06/21/13 1620 06/22/13 0416  NA 141 143  K 5.0 3.8  CL 101 102  CO2 31 30  GLUCOSE 151* 139*  BUN 54* 50*  CREATININE 2.11* 1.90*  CALCIUM 8.9 8.6    Liver Function Tests:  Recent Labs Lab 06/21/13 1620  AST 15  ALT 6  ALKPHOS 88  BILITOT 0.5  PROT 7.2  ALBUMIN 3.2*    CBC:  Recent Labs Lab 06/21/13 1620  WBC 5.1  NEUTROABS 3.7  HGB 11.1*  HCT 35.3*  MCV 104.7*  PLT 208    Cardiac Enzymes:  Recent Labs Lab 06/21/13 1620 06/21/13 2243 06/22/13 0416  TROPONINI <0.30 <0.30 <0.30    BNP: No components found with this basename: POCBNP,    Radiology: Dg Chest Portable 1 View  06/21/2013   CLINICAL DATA:  Shortness of breath.  EXAM: PORTABLE CHEST - 1 VIEW  COMPARISON:  06/10/2013  FINDINGS: 1629 hrs. Low volume lordotic film. Vascular congestion with new diffuse interstitial opacity is compatible with interstitial pulmonary edema. There is basilar atelectasis. The cardio pericardial silhouette is enlarged. Bones are diffusely demineralized. Telemetry leads overlie the chest.  IMPRESSION: Low lung volumes with vascular congestion and interstitial pulmonary edema.   Electronically Signed   By: Misty Stanley M.D.   On: 06/21/2013 16:42     ECG: Atrial fib with RBBB rate of 89 bpm.   Impression and Recommendations  1. Acute on Chronic Diastolic CHF with pulmonary edema: Anasarca is evident. She is responding to IV diuretics with 2 liter diureses since admission. Still very edematous. Albumin 3.2. She will need continued diureses over several days. Last echo  10/06/2012 with normal systolic function with valvular leaflet thickening. Consider  repeating with harsh systolic murmur. She denies adding salt to her food, but foods at [facility] may have had salt in them.   2. Atrial fibrillation: Heart rate is well controlled currently. She has been taken off of Xarelto in the setting of CKD and placed on Apxiaban 2.5 mg BID. Also continues on metoprolol 50 mg BID and cardizem 240 mg daily..   3. COPD: She is being treated with breathing tx and O2. Breathing status improving.  4. Diabetes: Remains on insulin. Review of medications does not have her on ACE, likely due to CKD.      Signed: Phill Myron. Purcell Nails NP [facility] 06/22/2013, 11:09 AM Co-Sign MD

## 2013-06-22 NOTE — Care Management Note (Signed)
    Page 1 of 1   06/22/2013     11:31:32 AM   CARE MANAGEMENT NOTE 06/22/2013  Patient:  Catherine [name], Catherine [name]   Account Number:  [id number]  Date Initiated:  06/22/2013  Documentation initiated by:  Theophilus Kinds  Subjective/Objective Assessment:   Pt admitted from [facility] with CHF. Pt is active with [facility] RN for foley care.     Action/Plan:   CSW to arrange discharge to facility when medically stable. CM to arrange resumption of [facility] RN prior to discharge.   Anticipated DC Date:  06/25/2013   Anticipated DC Plan:  ASSISTED LIVING / REST HOME  In-house referral  Clinical Social Worker      DC Planning Services  CM consult      [facility] Choice  Resumption Of Svcs/PTA Provider   Choice offered to / List presented to:  C-4 Adult Children        HH arranged  HH-1 RN      [place].   Status of service:  Completed, signed off Medicare Important Message given?   (If response is "NO", the following Medicare IM given date fields will be blank) Date Medicare IM given:   Date Additional Medicare IM given:    Discharge Disposition:  ASSISTED LIVING  Per UR Regulation:    If discussed at Long Length of Stay Meetings, dates discussed:    Comments:  06/22/13 [place], RN BSN CM

## 2013-06-22 NOTE — Progress Notes (Signed)
NAME:  Catherine [name], Catherine [name]                   ACCOUNT NO.:  [id number]  MEDICAL RECORD NO.:  [id number]  LOCATION:  A302                          FACILITY:  [facility]  PHYSICIAN:  Paula Compton. Willey Blade, MD       DATE OF BIRTH:  Jun 22, 1923  DATE OF PROCEDURE:  06/22/2013 DATE OF DISCHARGE:                                PROGRESS NOTE   SUBJECTIVE:  [name] was admitted by the hospitalist yesterday after she presented with congestive heart failure.  She had developed swelling in her arms, legs, and abdomen.  She had increased shortness of breath. Chest x-ray revealed pulmonary edema.  Her proBNP was 9750.  BUN and creatinine were at 54 and 2.11.  She has been on Lasix 60 mg twice daily orally.  She has had 3 negative troponins.  Her hemoglobin is stable at 11.1.  She has chronic atrial fibrillation with satisfactory rate control on diltiazem and metoprolol.  She has been anticoagulated with Xarelto 15 mg daily.  She had an echo last June revealing an ejection fraction of 55-60% with mild to moderate concentric left ventricular hypertrophy.  She has pulmonary hypertension and is on chronic oxygen therapy.  She is feeling a little better this morning after being treated with IV Lasix.  She states she has gained about 20 pounds.  Her weight was recorded at 166 on admission, and is 164 today.  OBJECTIVE:  VITAL SIGNS:  Temperature 97.7, pulse 78, respirations 20, blood pressure 118/73, oxygen saturation is 93% on supplemental oxygen at 2 L. LUNGS:  Bilateral rales. HEART:  Irregular with a grade 2 systolic murmur. ABDOMEN:  Soft and nontender. EXTREMITIES:  She has marked edema in the upper extremities.  She has 1+ edema in the lower legs. SKIN:  Reveals multiple lesions consistent with epidermolysis bullosa, which has been followed by her dermatologist and Derm.  IMPRESSION/PLAN: 1. Acute on chronic diastolic heart failure.  Continue IV Lasix. 2. Chronic kidney disease.  BUN and creatinine are 50 and 1.90  today     with a potassium of 3.8. 3. Chronic atrial fibrillation.  We will modify Xarelto to Eliquis 2.5     mg twice a day based on her current renal function.  We will     consult with Cardiology. 4. Monoclonal myopathy of uncertain significance.  Blood counts are     stable.  She is followed by Hematology. 5. Diabetes, stable on low-dose Lantus.  Fasting glucose is 139. 6. Pulmonary hypertension. 7. Anxiety and depression.  Continue citalopram with p.r.n.     clonazepam.     Paula Compton. Willey Blade, MD     ROF/MEDQ  D:  06/22/2013  T:  06/22/2013  Job:  [id number]

## 2013-06-22 NOTE — Consult Note (Signed)
The patient was seen and examined, and I agree with the assessment and plan as documented above, with modifications as noted below. Pt with known h/o atrial fibrillation and chronic diastolic heart failure admitted with 20 lb wt gain and anasarca, likely secondary to a combination of left ventricular diastolic decompensation as well as right heart failure from pulmonary hypertension and consequent RV enlargement and dysfunction.  HR and BP remain well controlled on current doses of metoprolol and diltiazem. Initiated on IV Lasix 60 mg for 3 doses total and has put out approximately 2 liters of fluid. I will start Lasix 60 mg IV bid.  Given CKD, Xarelto dosed at 15 mg daily will provide adequate protection given her thromboembolic risk due to atrial fibrillation. Would not pursue any noninvasive cardiac studies at this time. Echo from 09/2012 showed normal LV systolic function, EF 43-56%, mild to moderate concentric LVH with RV enlargement and dysfunction, with mild MR and mild to moderate TR, PASP 60 mmHg.

## 2013-06-22 NOTE — Clinical Social Work Psychosocial (Signed)
Clinical Social Work Department BRIEF PSYCHOSOCIAL ASSESSMENT 06/22/2013  Patient:  Catherine [name], Catherine [name]     Account Number:  [id number]     Admit date:  06/21/2013  Clinical Social Worker:  Wyatt Haste  Date/Time:  06/22/2013 10:32 AM  Referred by:  CSW  Date Referred:  06/22/2013 Referred for  ALF Placement   Other Referral:   Interview type:  Patient Other interview type:    PSYCHOSOCIAL DATA Living Status:  FACILITY Admitted from facility:  [place] Level of care:  Assisted Living Primary support name:  Catherine [name] Primary support relationship to patient:  FAMILY Degree of support available:   support    CURRENT CONCERNS Current Concerns  Post-Acute Placement   Other Concerns:    SOCIAL WORK ASSESSMENT / PLAN CSW met with pt at bedside. Pt alert and oriented and well known to CSW from previous admissions. She reports she has been a resident at [facility] for a little over a year. Pt describes her best support as step children and states they are actively involved and visit regularly. She came to ED due to swelling and is admitted with CHF. Pt requests to return to [facility] at d/c. Per Butch Penny at facility, pt has chronic foley and 2 liters oxygen. She requires limited to extensive assist with ADLs, but is able to feed herself. Pt has both a wheelchair and a walker. She has a [facility] health [facility]. Okay for return.   Assessment/plan status:  Psychosocial Support/Ongoing Assessment of Needs Other assessment/ plan:   Information/referral to community resources:   [facility]    PATIENT'S/FAMILY'S RESPONSE TO PLAN OF CARE: Pt reports positive feelings regarding return to [facility] when medically stable. CSW will continue to follow. CM aware of home health.       Catherine [name], [place]

## 2013-06-23 LAB — BASIC METABOLIC PANEL
BUN: 48 mg/dL — ABNORMAL HIGH (ref 6–23)
CO2: 31 mEq/L (ref 19–32)
Calcium: 8.6 mg/dL (ref 8.4–10.5)
Chloride: 104 mEq/L (ref 96–112)
Creatinine, Ser: 2.02 mg/dL — ABNORMAL HIGH (ref 0.50–1.10)
GFR, EST AFRICAN AMERICAN: 24 mL/min — AB (ref >90)
GFR, EST NON AFRICAN AMERICAN: 21 mL/min — AB (ref >90)
Glucose, Bld: 71 mg/dL (ref 70–99)
Potassium: 4 mEq/L (ref 3.7–5.3)
SODIUM: 145 meq/L (ref 137–147)

## 2013-06-23 LAB — GLUCOSE, CAPILLARY: Glucose-Capillary: 97 mg/dL (ref 70–99)

## 2013-06-23 NOTE — Progress Notes (Signed)
NAME:  Catherine [name], DELATTE                   ACCOUNT NO.:  [id number]  MEDICAL RECORD NO.:  [id number]  LOCATION:  A302                          FACILITY:  [facility]  PHYSICIAN:  Paula Compton. Willey Blade, MD       DATE OF BIRTH:  1924-04-28  DATE OF PROCEDURE:  06/23/2013 DATE OF DISCHARGE:                                PROGRESS NOTE   Mrs. Stakes feels that her swelling in the legs and abdomen is better.  She still has a lot of swelling in the arms.  Her breathing is better.  She had a 1550 mL urine output yesterday.  Her weight is 164 again.  OBJECTIVE:  VITAL SIGNS:  Temperature 97.4, pulse 71, respirations 20, blood pressure 108/63. LUNGS:  Reveal basilar rales. HEART:  Irregularly irregular. ABDOMEN:  Soft and nontender. EXTREMITIES:  She has marked edema of the arms.  She has resolution of her lower leg edema.  IMPRESSION AND PLAN: 1. Pulmonary edema/acute on chronic diastolic heart failure. 2. Continue IV Lasix.  Potassium is 4.0 today. 3. Chronic kidney disease.  BUN and creatinine are 48 and 2.02. 4. Diabetes.  Fasting glucose is 71.  Continue Lantus. 5. Monoclonal gammopathy of unknown significance. 6. Pulmonary hypertension.     Paula Compton. Willey Blade, MD     ROF/MEDQ  D:  06/23/2013  T:  06/23/2013  Job:  [id number]

## 2013-06-23 NOTE — Progress Notes (Signed)
The patient was seen and examined, and I agree with the assessment and plan as documented above, with modifications as noted below.  Pt with known h/o atrial fibrillation and chronic diastolic heart failure admitted with 20 lb wt gain and anasarca, likely secondary to a combination of left ventricular diastolic decompensation as well as right heart failure from pulmonary hypertension and consequent RV enlargement and dysfunction.  HR slightly elevated today, but BP remains well controlled on current doses of metoprolol and diltiazem.  Initiated on IV Lasix 60 mg for 3 doses total and had put out approximately 2 liters of fluid. I started Lasix 60 mg IV bid on 2/24. Put out 1.4 liters on 2/24.  Given CKD, Xarelto dosed at 15 mg daily will provide adequate protection given her thromboembolic risk due to atrial fibrillation.  Would not pursue any noninvasive cardiac studies at this time. Echo from 09/2012 showed normal LV systolic function, EF 65-46%, mild to moderate concentric LVH with RV enlargement and dysfunction, with mild MR and mild to moderate TR, PASP 60 mmHg.

## 2013-06-23 NOTE — Progress Notes (Signed)
Subjective:  Breathing a little better. Can't get comfortable in the bed.  Objective:  Vital Signs in the last 24 hours: Temp:  [97.3 F (36.3 C)-97.6 F (36.4 C)] 97.4 F (36.3 C) (02/25 0408) Pulse Rate:  [53-80] 71 (02/25 0408) Resp:  [20] 20 (02/25 0408) BP: (79-109)/(56-68) 108/63 mmHg (02/25 0408) SpO2:  [80 %-92 %] 90 % (02/25 0408) Weight:  [164 lb 14.5 oz (74.8 kg)] 164 lb 14.5 oz (74.8 kg) (02/25 0600)  Intake/Output from previous day: 02/24 0701 - 02/25 0700 In: 120 [P.O.:120] Out: 1550 [Urine:1550] Intake/Output from this shift:    Physical Exam: NECK: IncreasedJVD, HJR, no bruit LUNGS:Decreased breath sounds with rales 1/2 up lung fields HEART: Irregular rate and rhythm, no murmur, gallop, rub, bruit, thrill, or heave ABDOMEN: anasarca EXTREMITIES: upper and lower extremity edema    Lab Results:  Recent Labs  06/21/13 1620  WBC 5.1  HGB 11.1*  PLT 208    Recent Labs  06/22/13 0416 06/23/13 0447  NA 143 145  K 3.8 4.0  CL 102 104  CO2 30 31  GLUCOSE 139* 71  BUN 50* 48*  CREATININE 1.90* 2.02*    Recent Labs  06/21/13 2243 06/22/13 0416  TROPONINI <0.30 <0.30   Hepatic Function Panel  Recent Labs  06/21/13 1620  PROT 7.2  ALBUMIN 3.2*  AST 15  ALT 6  ALKPHOS 88  BILITOT 0.5   No results found for this basename: CHOL,  in the last 72 hours No results found for this basename: PROTIME,  in the last 72 hours  Imaging:   Cardiac Studies:  Assessment/Plan:  1. Acute on Chronic Diastolic CHF with pulmonary edema: Anasarca is evident. She is responding to IV diuretics with 2 liter diureses since admission. Still very edematous. Albumin 3.2. She will need continued diureses over several days. Last echo  10/06/2012 with normal systolic function with valvular leaflet thickening.   2. Atrial fibrillation: Heart rate is well controlled currently on Xarelto 15 mg in the setting of CKD. Also continues on metoprolol 50 mg BID and cardizem  240 mg daily..   3. COPD: She is being treated with breathing tx and O2. Breathing status improving.  4. Diabetes: Remains on insulin.    LOS: 2 days    Ermalinda Barrios 06/23/2013, 8:59 AM

## 2013-06-24 LAB — BASIC METABOLIC PANEL
BUN: 47 mg/dL — ABNORMAL HIGH (ref 6–23)
CALCIUM: 8.8 mg/dL (ref 8.4–10.5)
CO2: 34 mEq/L — ABNORMAL HIGH (ref 19–32)
Chloride: 103 mEq/L (ref 96–112)
Creatinine, Ser: 2 mg/dL — ABNORMAL HIGH (ref 0.50–1.10)
GFR, EST AFRICAN AMERICAN: 25 mL/min — AB (ref >90)
GFR, EST NON AFRICAN AMERICAN: 21 mL/min — AB (ref >90)
Glucose, Bld: 130 mg/dL — ABNORMAL HIGH (ref 70–99)
POTASSIUM: 4.3 meq/L (ref 3.7–5.3)
SODIUM: 146 meq/L (ref 137–147)

## 2013-06-24 LAB — GLUCOSE, CAPILLARY: Glucose-Capillary: 158 mg/dL — ABNORMAL HIGH (ref 70–99)

## 2013-06-24 NOTE — Progress Notes (Signed)
SUBJECTIVE: Mrs. Petterson is doing well this morning. Says her arms are less swollen and legs feel better. Denies shortness of breath but did hear herself wheezing. Also had some nosebleeds with clots.     Intake/Output Summary (Last 24 hours) at 06/24/13 1156 Last data filed at 06/24/13 1122  Gross per 24 hour  Intake    480 ml  Output   2200 ml  Net  -1720 ml    Current Facility-Administered Medications  Medication Dose Route Frequency Provider Last Rate Last Dose  . 0.9 %  sodium chloride infusion  250 mL Intravenous PRN Phillips Grout, MD      . albuterol (PROVENTIL) (2.5 MG/3ML) 0.083% nebulizer solution 3 mL  3 mL Inhalation Q6H PRN Phillips Grout, MD   3 mL at 06/22/13 1453  . atorvastatin (LIPITOR) tablet 10 mg  10 mg Oral QHS Phillips Grout, MD   10 mg at 06/23/13 2139  . calcium carbonate (OS-CAL - dosed in mg of elemental calcium) tablet 500 mg of elemental calcium  500 mg of elemental calcium Oral BID Phillips Grout, MD   500 mg of elemental calcium at 06/24/13 0903  . Chlorhexidine Gluconate Cloth 2 % PADS 6 each  6 each Topical Q0600 Asencion Noble, MD   6 each at 06/24/13 [phone]  . cholecalciferol (VITAMIN D) tablet 2,000 Units  2,000 Units Oral Daily Phillips Grout, MD   2,000 Units at 06/24/13 0902  . citalopram (CELEXA) tablet 20 mg  20 mg Oral Daily Phillips Grout, MD   20 mg at 06/24/13 [id number]  . clonazePAM (KLONOPIN) tablet 0.5 mg  0.5 mg Oral TID PRN Phillips Grout, MD      . cyanocobalamin ((VITAMIN B-12)) injection 1,000 mcg  1,000 mcg Intramuscular Q30 days Phillips Grout, MD   1,000 mcg at 06/22/13 1108  . diltiazem (CARDIZEM CD) 24 hr capsule 240 mg  240 mg Oral Daily Phillips Grout, MD   240 mg at 06/24/13 0902  . ferrous sulfate tablet 325 mg  325 mg Oral Q breakfast Phillips Grout, MD   325 mg at 06/24/13 0906  . furosemide (LASIX) injection 60 mg  60 mg Intravenous BID Herminio Commons, MD   60 mg at 06/24/13 0902  . insulin glargine (LANTUS) injection 12  Units  12 Units Subcutaneous Daily Phillips Grout, MD   12 Units at 06/24/13 0911  . metoprolol (LOPRESSOR) tablet 50 mg  50 mg Oral BID Phillips Grout, MD   50 mg at 06/24/13 [id number]  . mirtazapine (REMERON) tablet 7.5 mg  7.5 mg Oral QHS Asencion Noble, MD   7.5 mg at 06/23/13 2139  . mupirocin ointment (BACTROBAN) 2 % 1 application  1 application Nasal BID Phillips Grout, MD   1 application at [id number] [phone]  . pantoprazole (PROTONIX) EC tablet 40 mg  40 mg Oral Daily Phillips Grout, MD   40 mg at 06/24/13 [id number]  . polyethylene glycol (MIRALAX / GLYCOLAX) packet 17 g  17 g Oral Daily PRN Phillips Grout, MD      . potassium chloride SA (K-DUR,KLOR-CON) CR tablet 40 mEq  40 mEq Oral Daily Asencion Noble, MD   40 mEq at 06/24/13 0902  . Rivaroxaban (XARELTO) tablet 15 mg  15 mg Oral Q supper Asencion Noble, MD   15 mg at 06/23/13 1807  . sodium chloride 0.9 % injection 3 mL  3  mL Intravenous Q12H Phillips Grout, MD   3 mL at 06/24/13 0907  . sodium chloride 0.9 % injection 3 mL  3 mL Intravenous PRN Phillips Grout, MD        Filed Vitals:   06/23/13 1923 06/23/13 2133 06/24/13 0531 06/24/13 0902  BP:  109/61 116/83 102/61  Pulse:  96 78 99  Temp: 98 F (36.7 C)  97.7 F (36.5 C)   TempSrc: Oral  Oral   Resp: 20  20   Height:      Weight:   162 lb 8 oz (73.71 kg)   SpO2: 93%  95%     PHYSICAL EXAM General: NAD Neck: + JVD, no thyromegaly.  Lungs: Bibasilar rales. CV: Nondisplaced PMI.  Irregular rhythm, normal rate, normal S1/S2, no S3, II/VI holosystolic murmur.  Trace pretibial edema with bilateral thigh edema, with bilateral upper extremity edema still present but diminished with more skin wrinkling noted.  No carotid bruit.  Normal pedal pulses.  Abdomen: Soft, nontender, no hepatosplenomegaly, mildly edematous.  Neurologic: Alert and oriented x 3.  Psych: Normal affect. Extremities: No clubbing or cyanosis.   TELEMETRY: Reviewed telemetry pt in rate-controlled atrial fibrillation.  LABS: Basic  Metabolic Panel:  Recent Labs  06/23/13 0447 06/24/13 0444  NA 145 146  K 4.0 4.3  CL 104 103  CO2 31 34*  GLUCOSE 71 130*  BUN 48* 47*  CREATININE 2.02* 2.00*  CALCIUM 8.6 8.8   Liver Function Tests:  Recent Labs  06/21/13 1620  AST 15  ALT 6  ALKPHOS 88  BILITOT 0.5  PROT 7.2  ALBUMIN 3.2*   No results found for this basename: LIPASE, AMYLASE,  in the last 72 hours CBC:  Recent Labs  06/21/13 1620  WBC 5.1  NEUTROABS 3.7  HGB 11.1*  HCT 35.3*  MCV 104.7*  PLT 208   Cardiac Enzymes:  Recent Labs  06/21/13 1620 06/21/13 2243 06/22/13 0416  TROPONINI <0.30 <0.30 <0.30   BNP: No components found with this basename: POCBNP,  D-Dimer: No results found for this basename: DDIMER,  in the last 72 hours Hemoglobin A1C: No results found for this basename: HGBA1C,  in the last 72 hours Fasting Lipid Panel: No results found for this basename: CHOL, HDL, LDLCALC, TRIG, CHOLHDL, LDLDIRECT,  in the last 72 hours Thyroid Function Tests: No results found for this basename: TSH, T4TOTAL, FREET3, T3FREE, THYROIDAB,  in the last 72 hours Anemia Panel: No results found for this basename: VITAMINB12, FOLATE, FERRITIN, TIBC, IRON, RETICCTPCT,  in the last 72 hours  RADIOLOGY: Dg Chest Portable 1 View  06/21/2013   CLINICAL DATA:  Shortness of breath.  EXAM: PORTABLE CHEST - 1 VIEW  COMPARISON:  06/10/2013  FINDINGS: 1629 hrs. Low volume lordotic film. Vascular congestion with new diffuse interstitial opacity is compatible with interstitial pulmonary edema. There is basilar atelectasis. The cardio pericardial silhouette is enlarged. Bones are diffusely demineralized. Telemetry leads overlie the chest.  IMPRESSION: Low lung volumes with vascular congestion and interstitial pulmonary edema.   Electronically Signed   By: Misty Stanley M.D.   On: 06/21/2013 16:42   Dg Abd Acute W/chest  06/10/2013   CLINICAL DATA:  80 year old female with shortness of breath, constipation,  rectal pain. Recent weight gain. Initial encounter.  EXAM: ACUTE ABDOMEN SERIES (ABDOMEN 2 VIEW & CHEST 1 VIEW)  COMPARISON:  Chest radiographs 12/10/2012 and earlier. Abdomen radiographs 12/05/2012 and earlier. Chest CT 10/06/2012. CT Abdomen and Pelvis 12/13/2010.  FINDINGS:  Upright AP view of the chest. Interval improved ventilation. Stable cardiomegaly and mediastinal contours. No pneumothorax or pulmonary edema. No pneumoperitoneum.  Non obstructed bowel gas pattern. Gray hazy opacity throughout the abdomen suggesting ascites. Right upper quadrant surgical clips. Extensive aortoiliac and other abdominal calcified atherosclerosis. Bilateral hip arthroplasties. Stable visualized osseous structures.  IMPRESSION: 1.  Non obstructed bowel gas pattern, no free air. 2. Ascites. 3.  No acute cardiopulmonary abnormality.   Electronically Signed   By: Lars Pinks M.D.   On: 06/10/2013 19:55      ASSESSMENT AND PLAN: Pt with known h/o atrial fibrillation and chronic diastolic heart failure admitted with 20 lb wt gain and anasarca, likely secondary to a combination of left ventricular diastolic decompensation as well as right heart failure from pulmonary hypertension and consequent RV enlargement and dysfunction.  HR better controlled today, and BP remains well controlled on current doses of metoprolol (50 mg bid) and diltiazem (240 mg daily).  Initiated on IV Lasix 60 mg for 3 doses total and had put out approximately 2 liters of fluid. I started Lasix 60 mg IV bid on 2/24. Put had net output of 1.4 liters on 2/25.  Given CKD, Xarelto dosed at 15 mg daily will provide adequate protection given her thromboembolic risk due to atrial fibrillation.  Would not pursue any noninvasive cardiac studies at this time. Echo from 09/2012 showed normal LV systolic function, EF 19-50%, mild to moderate concentric LVH with RV enlargement and dysfunction, with mild MR and mild to moderate TR, PASP 60 mmHg.    [name],  M.D., F.A.C.C.

## 2013-06-24 NOTE — Progress Notes (Signed)
NAME:  Catherine [name], Catherine [name]                   ACCOUNT NO.:  [id number]  MEDICAL RECORD NO.:  [id number]  LOCATION:  A302                          FACILITY:  [facility]  PHYSICIAN:  Paula Compton. Willey Blade, MD       DATE OF BIRTH:  May 27, 1923  DATE OF PROCEDURE:  06/24/2013 DATE OF DISCHARGE:                                PROGRESS NOTE   Mrs. Plummer still feels swollen especially in her arms, her legs are better.  Her breathing is better.  She diuresed 1850 mL yesterday.  Her weight is not recorded this morning.  Her oxygen saturation is 95%.  OBJECTIVE:  VITAL SIGNS:  Temperature 97.7, pulse 78, respirations 20, blood pressure 116/83. LUNGS:  Bilateral rales. HEART:  Irregularly irregular with a grade 2-3 systolic murmur. ABDOMEN:  Nontender.  She still has some edema in the abdomen. EXTREMITIES:  Reveal significant remaining upper extremity edema. However, her lower extremity edema especially below the knees is much better.  She still has some edema in the thighs.  IMPRESSION AND PLAN: 1. Acute on chronic diastolic heart failure, improving.  Continue IV     Lasix. 2. Chronic kidney disease.  BUN and creatinine are stable at 47 and     2.0 with a potassium of 4.3. 3. Type 2 diabetes.  Fasting glucose is 130.  Continue Lantus. 4. Chronic atrial fibrillation.  Rate is well controlled with     metoprolol and diltiazem.  Continue Xarelto.     Paula Compton. Willey Blade, MD     ROF/MEDQ  D:  06/24/2013  T:  06/24/2013  Job:  [id number]

## 2013-06-25 DIAGNOSIS — J449 Chronic obstructive pulmonary disease, unspecified: Secondary | ICD-10-CM

## 2013-06-25 MED ORDER — METOPROLOL TARTRATE 50 MG PO TABS
50.0000 mg | ORAL_TABLET | Freq: Two times a day (BID) | ORAL | Status: DC
  Administered 2013-06-26: 50 mg via ORAL
  Filled 2013-06-25 (×2): qty 1

## 2013-06-25 MED ORDER — FUROSEMIDE 10 MG/ML IJ SOLN
40.0000 mg | Freq: Two times a day (BID) | INTRAMUSCULAR | Status: DC
  Administered 2013-06-25 – 2013-06-26 (×2): 40 mg via INTRAVENOUS
  Filled 2013-06-25 (×2): qty 4

## 2013-06-25 MED ORDER — METOPROLOL TARTRATE 50 MG PO TABS: 50.0000 mg | ORAL_TABLET | Freq: Two times a day (BID) | ORAL | Status: DC

## 2013-06-25 NOTE — Plan of Care (Signed)
Problem: Phase I Progression Outcomes Goal: Voiding-avoid urinary catheter unless indicated Outcome: Not Progressing Foley needed due to strict i/o's.

## 2013-06-25 NOTE — Progress Notes (Signed)
Consulting cardiologist: Bronson Ing  Subjective:    Feeling and breathing better. Still has upper extremity edema and which she finds uncomfortable.   Objective:   Temp:  [97.4 F (36.3 C)-97.9 F (36.6 C)] 97.4 F (36.3 C) (02/27 0525) Pulse Rate:  [76-100] 86 (02/27 0703) Resp:  [18-20] 18 (02/27 0703) BP: (94-124)/(60-94) 124/94 mmHg (02/27 0525) SpO2:  [95 %-97 %] 97 % (02/27 0703) Weight:  [158 lb 11.2 oz (71.986 kg)] 158 lb 11.2 oz (71.986 kg) (02/27 0525) Last BM Date: 06/24/13  Filed Weights   06/23/13 0600 06/24/13 0531 06/25/13 0525  Weight: 164 lb 14.5 oz (74.8 kg) 162 lb 8 oz (73.71 kg) 158 lb 11.2 oz (71.986 kg)    Intake/Output Summary (Last 24 hours) at 06/25/13 0907 Last data filed at 06/25/13 0900  Gross per 24 hour  Intake    240 ml  Output   2650 ml  Net  -2410 ml    Telemetry:Atrial fib 80's-90's  Exam:  General: No acute distress.  HEENT: Conjunctiva and lids normal, oropharynx clear.  Lungs: Inspiratory and expiratory wheezes. Mild bibasilar crackles.   Cardiac: No elevated JVP or bruits. 1/6 systolic murmur, IRRR, no gallop or rub.   Abdomen: Normoactive bowel sounds, nontender, mildly distended.  Extremities: Upper extremity pitting edema, distal pulses full.  Neuropsychiatric: Alert and oriented x3, affect appropriate.   Lab Results:  Basic Metabolic Panel:  Recent Labs Lab 06/22/13 0416 06/23/13 0447 06/24/13 0444  NA 143 145 146  K 3.8 4.0 4.3  CL 102 104 103  CO2 30 31 34*  GLUCOSE 139* 71 130*  BUN 50* 48* 47*  CREATININE 1.90* 2.02* 2.00*  CALCIUM 8.6 8.6 8.8    Liver Function Tests:  Recent Labs Lab 06/21/13 1620  AST 15  ALT 6  ALKPHOS 88  BILITOT 0.5  PROT 7.2  ALBUMIN 3.2*    CBC:  Recent Labs Lab 06/21/13 1620  WBC 5.1  HGB 11.1*  HCT 35.3*  MCV 104.7*  PLT 208    Cardiac Enzymes:  Recent Labs Lab 06/21/13 1620 06/21/13 2243 06/22/13 0416  TROPONINI <0.30 <0.30 <0.30      Recent Labs  10/09/12 0445 10/22/12 1540 06/21/13 1620  PROBNP 4038.0* 6911.00* 9750.0*  :   Medications:   Scheduled Medications: . atorvastatin  10 mg Oral QHS  . calcium carbonate  500 mg of elemental calcium Oral BID  . Chlorhexidine Gluconate Cloth  6 each Topical Q0600  . cholecalciferol  2,000 Units Oral Daily  . citalopram  20 mg Oral Daily  . cyanocobalamin  1,000 mcg Intramuscular Q30 days  . diltiazem  240 mg Oral Daily  . ferrous sulfate  325 mg Oral Q breakfast  . furosemide  60 mg Intravenous BID  . insulin glargine  12 Units Subcutaneous Daily  . metoprolol  50 mg Oral BID  . mirtazapine  7.5 mg Oral QHS  . mupirocin ointment  1 application Nasal BID  . pantoprazole  40 mg Oral Daily  . potassium chloride SA  40 mEq Oral Daily  . rivaroxaban  15 mg Oral Q supper  . sodium chloride  3 mL Intravenous Q12H    Infusions:    PRN Medications: sodium chloride, albuterol, clonazePAM, polyethylene glycol, sodium chloride   Assessment and Plan:   1. Acute on Chronic Diastolic CHF: Anasarca is improving, with 6 lb wt loss. Still with upper extremity edema, and lower lobe crackles. Closer to dry wt of 148. Creatinine  rising to 2.0. Continues excellent diureses >2410 over last 24 hours. She will need stricter control of low sodium diet at SNF. On lasix 60 mg BID as OP. One more day of IV diuretics but will decrease to 40 mg IV BID due to rising creatinine.. Consider changing to torsemide for better bioavailability as OP.   2.Atrial fibrillation: Heart rate is controlled currently. Continue on renal dose of Xarelto, metoprolol 50 mg BID, and cardizem 240 mg daily.  3.COPD: Breathing status is improving but continues to have wheezes.   Easton Purcell Nails NP [facility] 06/25/2013, 9:07 AM

## 2013-06-25 NOTE — Progress Notes (Signed)
The patient was seen and examined, and I agree with the assessment and plan as documented above, with modifications as noted below.  Arms less swollen. Did again complain of some mild wheezing earlier this morning. Pt with known h/o atrial fibrillation and chronic diastolic heart failure admitted with 20 lb wt gain and anasarca, likely secondary to a combination of left ventricular diastolic decompensation as well as right heart failure from pulmonary hypertension and consequent RV enlargement and dysfunction.  HR in high 90 bpm range today, but BP remains well controlled on current doses of metoprolol (50 mg bid) and diltiazem (240 mg daily).  Initiated on IV Lasix 60 mg for 3 doses total and had put out approximately 2 liters of fluid. I started Lasix 60 mg IV bid on 2/24. Put had net output of 2.4 liters on 2/26.  BMET not yet checked today, but creatinine 2 on 2/26, so will reduce Lasix to IV 40 mg bid. Given CKD, Xarelto dosed at 15 mg daily will provide adequate protection given her thromboembolic risk due to atrial fibrillation.  Would not pursue any noninvasive cardiac studies at this time. Echo from 09/2012 showed normal LV systolic function, EF 49-67%, mild to moderate concentric LVH with RV enlargement and dysfunction, with mild MR and mild to moderate TR, PASP 60 mmHg.  Will ask nursing to provide inhalers for wheezing.

## 2013-06-25 NOTE — Clinical Social Work Note (Signed)
CSW reviewed chart, spoke w Butch Penny at [facility].  Facility aware patient may dc over weekend, are willing to transport.  Asked that RN update them in the AM regarding possible discharge plans and needs.  Edwyna Shell, LCSW Clinical Social Worker [phone])

## 2013-06-25 NOTE — Progress Notes (Signed)
Patient has a generalized red rash noted from her abdomen to her legs.  It is also noted on her sacral area.  Some of the areas are open.  There are dressings over the open areas.  Pt does not c/o any pain in the areas.

## 2013-06-25 NOTE — Progress Notes (Signed)
NAME:  Catherine [name], Catherine [name]                   ACCOUNT NO.:  [id number]  MEDICAL RECORD NO.:  [id number]  LOCATION:  A302                          FACILITY:  [facility]  PHYSICIAN:  Paula Compton. Willey Blade, MD       DATE OF BIRTH:  [birth year]  DATE OF PROCEDURE:  06/25/2013 DATE OF DISCHARGE:                                PROGRESS NOTE   SUBJECTIVE:  Catherine [name] feels better this morning.  OBJECTIVE:  VITAL SIGNS:  Her weight is down to 158.  Temperature is 97.4, pulse 86, respirations 18, blood pressure 124/94.  She had a urine output yesterday of 2650. LUNGS:  Bilateral rales. HEART:  Irregularly irregular with a grade 2 systolic murmur. ABDOMEN:  Soft and nontender.  Mild edema persists. EXTREMITIES:  Thigh edema and arm edema are definitely improved.  IMPRESSION AND PLAN: 1. Congestive heart failure.  Continue IV Lasix. 2. Chronic atrial fibrillation.  Continue Xarelto.  She did have a     nosebleed yesterday, which has stopped. 3. Chronic kidney disease.  Repeat metabolic profile tomorrow. 4. Diabetes, stable on Lantus.     Paula Compton. Willey Blade, MD     ROF/MEDQ  D:  06/25/2013  T:  06/25/2013  Job:  [id number]

## 2013-06-25 NOTE — Progress Notes (Signed)
S:  HR on the telementry monitor showing decreased heart rate.  B: Patient admitted with Diastolic CHF acute on chronic  A: Patient was alert and oriented.  She had no complaints at this time.  VS are noted under FS.  HR range 35-50.  R:  Notified Dr. Willey Blade of the situation.  Voiced to him Metoprolol given this am at around 10:30 am.  His recommendations were to add parameters to the Metoprolol and Diltizem doses.   I will continue to monitor her.

## 2013-06-26 LAB — BASIC METABOLIC PANEL
BUN: 42 mg/dL — ABNORMAL HIGH (ref 6–23)
CALCIUM: 9.3 mg/dL (ref 8.4–10.5)
CO2: 34 mEq/L — ABNORMAL HIGH (ref 19–32)
Chloride: 102 mEq/L (ref 96–112)
Creatinine, Ser: 1.82 mg/dL — ABNORMAL HIGH (ref 0.50–1.10)
GFR, EST AFRICAN AMERICAN: 28 mL/min — AB (ref >90)
GFR, EST NON AFRICAN AMERICAN: 24 mL/min — AB (ref >90)
Glucose, Bld: 126 mg/dL — ABNORMAL HIGH (ref 70–99)
POTASSIUM: 3.6 meq/L — AB (ref 3.7–5.3)
SODIUM: 144 meq/L (ref 137–147)

## 2013-06-26 MED ORDER — FUROSEMIDE 80 MG PO TABS
80.0000 mg | ORAL_TABLET | Freq: Two times a day (BID) | ORAL | Status: DC
Start: 2013-06-26 — End: 2013-08-02

## 2013-06-26 NOTE — Progress Notes (Signed)
Clinical Education officer, museum (CSW) updated FL2 with D/C medications and faxed to [facility]. Please reconsult if further social work needs arise. CSW signing off.   Blima Rich, [place] Weekend CSW [phone]

## 2013-06-26 NOTE — Progress Notes (Signed)
Faxed FL2 and discharge summary to [facility] [place].  Spoke with Horticulturist, commercial.  Voice to her to call us back at [facility] for any issues.  She verbalized understanding.

## 2013-06-26 NOTE — Progress Notes (Signed)
Attempted to notify Skip Estimable the weekend coverage social worker to get an update on when the [place] would be available so it can be faxed to [facility].  I was unable to speak to her at this time. The call went to voicemail.  A message was left and I voiced to her I would try to call her back soon.

## 2013-06-26 NOTE — Discharge Summary (Signed)
NAME:  Catherine [name], Catherine [name]                   ACCOUNT NO.:  [id number]  MEDICAL RECORD NO.:  [id number]  LOCATION:  A302                          FACILITY:  [facility]  PHYSICIAN:  Paula Compton. Willey Blade, MD       DATE OF BIRTH:  10-19-1923  DATE OF ADMISSION:  06/21/2013 DATE OF DISCHARGE:  LH                         DISCHARGE SUMMARY-REFERRING   DISCHARGE DIAGNOSES: 1. Acute on chronic diastolic heart failure. 2. Chronic atrial fibrillation. 3. Chronic kidney disease, stage III. 4. Pulmonary hypertension. 5. Epidermolysis bullosa. 6. Osteoporosis. 7. Monoclonal gammopathy of uncertain significance. 8. History of breast cancer. 9. Type 2 diabetes.  HOSPITAL COURSE:  This patient is an 80 year old female who presented with shortness of breath and diffuse swelling.  Her proBNP was 9750, BUN and creatinine are elevated at 54 and 2.11.  Her chest x-ray revealed vascular congestion.  She was treated with IV Lasix.  She had significant diuresis and weight reduction.  She had, had an echo last June revealing an ejection fraction of 55-60%.  She has underlying pulmonary hypertension and is on chronic oxygen therapy.  Her symptoms improved over several days.  Her swelling in her arms and legs and abdomen, improved.  She was evaluated and treated by Cardiology.  Her atrial fibrillation was controlled with metoprolol and diltiazem. She is anticoagulated with Xarelto.  Diabetes was stable on low-dose Lantus.  Blood counts have remained stable.  She has MGUS followed by Hematology. Her current CBC reveals a white count of 5.1 with a hemoglobin of 11.1 and platelets of 208,000.  Her chronic kidney disease is stable with a BUN and creatinine at discharge at 42 and 1.82.  Her discharge Lasix dose will be modified at 80 mg orally twice a day. She had been treated with Lasix 60 mg IV q.12 and then 40 mg IV q.12 hours.  DISCHARGE MEDICATIONS:  Lasix 80 mg p.o. b.i.d., potassium 40 mEq daily, acetaminophen 650 mg  every 6 hours as needed, Proventil inhaler 2 puffs every 4 hours as needed, Lipitor 10 mg at bedtime, vitamin D 2000 units daily, Celexa 20 mg daily, clonazepam 0.5 mg t.i.d. p.r.n., vitamin B12 1000 mcg every month, diltiazem XR 240 mg daily, metoprolol 50 mg b.i.d., ferrous sulfate 325 mg daily, Lidex 0.05% cream applied topically daily, Lantus 12 units daily, Remeron 15 mg at bedtime, Bactroban ointment, apply b.i.d. to open wound, calcium 500 mg b.i.d., Protonix 40 mg daily, MiraLAX 17 g daily as needed for constipation, Xarelto 15 mg daily.  FOLLOWUP:  The patient will be seen in my office in 1 week.  She will have continued use of her Foley catheter, and of her supplemental oxygen.     Paula Compton. Willey Blade, MD     ROF/MEDQ  D:  06/26/2013  T:  06/26/2013  Job:  [id number]

## 2013-06-26 NOTE — Progress Notes (Signed)
Patient discharged with instructions, prescriptions, and carenotes.  The packet was given to the [facility] transporter to give to the floor supervisor Baker Janus or Wells Guiles.  I spoke with Baker Janus at the facility and voiced to her that I was waiting on Baily the Social Worker from [facility] to send me the [facility].  Baker Janus stated that the patient would have to be there before 3 pm or there would be no transporter.    I called her back and stated that [facility] had not sent the [facility] yet.  She stated that she really is not suppose to except the patients back without the FL2 due if there were new meds they would not be able to have them filled.  I voiced to her the only change to her medication was an increase in her amount of lasix and according to her home med rec she was already on lasix.  She verbalized understanding.  I asked her since the transporter was in route if it was any way that the [facility] could be faxed.  She agreed.  I voiced to her that I would call her and verify that she got the faxed FL2.   The patient left the floor via w/c with the staff and the transporter.  She was fully dressed and in stable condition.     Spoke with Cassandra a nurse from [place].  My attempt was to let them know that the care for the patients chronic foley by nursing should be resumed.  This comes from [place], [place] took my number and stated that someone either her or someone from from referral would notify me if there were any issues.  I voiced to her if I don't hear anything I will assume that the care would be restarted.  She verbalized understanding.

## 2013-06-28 NOTE — Clinical Social Work Note (Signed)
CSW received call from [facility] stating they had not received FL2 in discharge packet.  Chart note states RN faxed to facility along w discharge summary.  CSW contacted MD and got additional copy signed, reviewed medications w pharmacist, faxed copy to [place] confirmed receipt of signed FL2.  States patient has O2 needed at facility and that [facility] Rn visited ALF yesterday.  CSW supervisor informed of facility concerns.  Edwyna Shell, LCSW Clinical Social Worker [phone])

## 2013-06-28 NOTE — Progress Notes (Signed)
UR chart review completed.  

## 2013-07-27 ENCOUNTER — Emergency Department (HOSPITAL_COMMUNITY): Payer: Medicare Other

## 2013-07-27 ENCOUNTER — Inpatient Hospital Stay (HOSPITAL_COMMUNITY)
Admission: EM | Admit: 2013-07-27 | Discharge: 2013-08-02 | DRG: 291 | Disposition: A | Discharge status: Other Acute Inpatient Hospital | Payer: Medicare Other | Attending: Internal Medicine | Admitting: Internal Medicine

## 2013-07-27 ENCOUNTER — Encounter (HOSPITAL_COMMUNITY): Payer: Self-pay | Admitting: Emergency Medicine

## 2013-07-27 DIAGNOSIS — J449 Chronic obstructive pulmonary disease, unspecified: Secondary | ICD-10-CM | POA: Diagnosis present

## 2013-07-27 DIAGNOSIS — E119 Type 2 diabetes mellitus without complications: Secondary | ICD-10-CM | POA: Diagnosis present

## 2013-07-27 DIAGNOSIS — N039 Chronic nephritic syndrome with unspecified morphologic changes: Secondary | ICD-10-CM

## 2013-07-27 DIAGNOSIS — Z96649 Presence of unspecified artificial hip joint: Secondary | ICD-10-CM

## 2013-07-27 DIAGNOSIS — M810 Age-related osteoporosis without current pathological fracture: Secondary | ICD-10-CM | POA: Diagnosis present

## 2013-07-27 DIAGNOSIS — I10 Essential (primary) hypertension: Secondary | ICD-10-CM | POA: Diagnosis present

## 2013-07-27 DIAGNOSIS — Z9981 Dependence on supplemental oxygen: Secondary | ICD-10-CM

## 2013-07-27 DIAGNOSIS — I509 Heart failure, unspecified: Secondary | ICD-10-CM | POA: Diagnosis present

## 2013-07-27 DIAGNOSIS — R0600 Dyspnea, unspecified: Secondary | ICD-10-CM

## 2013-07-27 DIAGNOSIS — I340 Nonrheumatic mitral (valve) insufficiency: Secondary | ICD-10-CM

## 2013-07-27 DIAGNOSIS — D472 Monoclonal gammopathy: Secondary | ICD-10-CM | POA: Diagnosis present

## 2013-07-27 DIAGNOSIS — Z8673 Personal history of transient ischemic attack (TIA), and cerebral infarction without residual deficits: Secondary | ICD-10-CM

## 2013-07-27 DIAGNOSIS — I959 Hypotension, unspecified: Secondary | ICD-10-CM | POA: Diagnosis present

## 2013-07-27 DIAGNOSIS — R339 Retention of urine, unspecified: Secondary | ICD-10-CM | POA: Diagnosis present

## 2013-07-27 DIAGNOSIS — I2789 Other specified pulmonary heart diseases: Secondary | ICD-10-CM | POA: Diagnosis present

## 2013-07-27 DIAGNOSIS — F411 Generalized anxiety disorder: Secondary | ICD-10-CM | POA: Diagnosis present

## 2013-07-27 DIAGNOSIS — Z8249 Family history of ischemic heart disease and other diseases of the circulatory system: Secondary | ICD-10-CM

## 2013-07-27 DIAGNOSIS — F0390 Unspecified dementia without behavioral disturbance: Secondary | ICD-10-CM | POA: Diagnosis present

## 2013-07-27 DIAGNOSIS — Z9010 Acquired absence of unspecified breast and nipple: Secondary | ICD-10-CM

## 2013-07-27 DIAGNOSIS — Z7901 Long term (current) use of anticoagulants: Secondary | ICD-10-CM

## 2013-07-27 DIAGNOSIS — L299 Pruritus, unspecified: Secondary | ICD-10-CM | POA: Diagnosis present

## 2013-07-27 DIAGNOSIS — J4489 Other specified chronic obstructive pulmonary disease: Secondary | ICD-10-CM | POA: Diagnosis present

## 2013-07-27 DIAGNOSIS — Z833 Family history of diabetes mellitus: Secondary | ICD-10-CM

## 2013-07-27 DIAGNOSIS — I5033 Acute on chronic diastolic (congestive) heart failure: Principal | ICD-10-CM | POA: Diagnosis present

## 2013-07-27 DIAGNOSIS — I129 Hypertensive chronic kidney disease with stage 1 through stage 4 chronic kidney disease, or unspecified chronic kidney disease: Secondary | ICD-10-CM | POA: Diagnosis present

## 2013-07-27 DIAGNOSIS — I2781 Cor pulmonale (chronic): Secondary | ICD-10-CM

## 2013-07-27 DIAGNOSIS — F3289 Other specified depressive episodes: Secondary | ICD-10-CM | POA: Diagnosis present

## 2013-07-27 DIAGNOSIS — Z794 Long term (current) use of insulin: Secondary | ICD-10-CM

## 2013-07-27 DIAGNOSIS — R0609 Other forms of dyspnea: Secondary | ICD-10-CM

## 2013-07-27 DIAGNOSIS — R0902 Hypoxemia: Secondary | ICD-10-CM

## 2013-07-27 DIAGNOSIS — N184 Chronic kidney disease, stage 4 (severe): Secondary | ICD-10-CM | POA: Diagnosis present

## 2013-07-27 DIAGNOSIS — D631 Anemia in chronic kidney disease: Secondary | ICD-10-CM | POA: Diagnosis present

## 2013-07-27 DIAGNOSIS — J9620 Acute and chronic respiratory failure, unspecified whether with hypoxia or hypercapnia: Secondary | ICD-10-CM | POA: Diagnosis present

## 2013-07-27 DIAGNOSIS — R0989 Other specified symptoms and signs involving the circulatory and respiratory systems: Secondary | ICD-10-CM

## 2013-07-27 DIAGNOSIS — F329 Major depressive disorder, single episode, unspecified: Secondary | ICD-10-CM | POA: Diagnosis present

## 2013-07-27 DIAGNOSIS — Z853 Personal history of malignant neoplasm of breast: Secondary | ICD-10-CM

## 2013-07-27 DIAGNOSIS — I4891 Unspecified atrial fibrillation: Secondary | ICD-10-CM | POA: Diagnosis present

## 2013-07-27 DIAGNOSIS — N179 Acute kidney failure, unspecified: Secondary | ICD-10-CM | POA: Diagnosis present

## 2013-07-27 DIAGNOSIS — D62 Acute posthemorrhagic anemia: Secondary | ICD-10-CM

## 2013-07-27 LAB — TROPONIN I
Troponin I: <0.3 ng/mL (ref <0.30)
Troponin I: <0.3 ng/mL (ref <0.30)

## 2013-07-27 LAB — CBC WITH DIFFERENTIAL/PLATELET
Basophils Absolute: 0 10*3/uL (ref 0.0–0.1)
Basophils Relative: 0 % (ref 0–1)
Eosinophils Absolute: 0.2 10*3/uL (ref 0.0–0.7)
Eosinophils Relative: 4 % (ref 0–5)
HCT: 33.7 % — ABNORMAL LOW (ref 36.0–46.0)
HEMOGLOBIN: 10.6 g/dL — AB (ref 12.0–15.0)
LYMPHS ABS: 0.6 10*3/uL — AB (ref 0.7–4.0)
Lymphocytes Relative: 14 % (ref 12–46)
MCH: 33.1 pg (ref 26.0–34.0)
MCHC: 31.5 g/dL (ref 30.0–36.0)
MCV: 105.3 fL — AB (ref 78.0–100.0)
MONOS PCT: 7 % (ref 3–12)
Monocytes Absolute: 0.3 10*3/uL (ref 0.1–1.0)
NEUTROS PCT: 75 % (ref 43–77)
Neutro Abs: 3.2 10*3/uL (ref 1.7–7.7)
Platelets: 163 10*3/uL (ref 150–400)
RBC: 3.2 MIL/uL — AB (ref 3.87–5.11)
RDW: 15.6 % — ABNORMAL HIGH (ref 11.5–15.5)
WBC: 4.2 10*3/uL (ref 4.0–10.5)

## 2013-07-27 LAB — URINALYSIS, ROUTINE W REFLEX MICROSCOPIC
Bilirubin Urine: NEGATIVE
GLUCOSE, UA: NEGATIVE mg/dL
Ketones, ur: NEGATIVE mg/dL
Nitrite: NEGATIVE
Protein, ur: 30 mg/dL — AB
Specific Gravity, Urine: 1.02 (ref 1.005–1.030)
Urobilinogen, UA: 0.2 mg/dL (ref 0.0–1.0)
pH: 5.5 (ref 5.0–8.0)

## 2013-07-27 LAB — URINE MICROSCOPIC-ADD ON

## 2013-07-27 LAB — PRO B NATRIURETIC PEPTIDE: PRO B NATRI PEPTIDE: 12316 pg/mL — AB (ref 0–450)

## 2013-07-27 LAB — BASIC METABOLIC PANEL
BUN: 66 mg/dL — ABNORMAL HIGH (ref 6–23)
CHLORIDE: 100 meq/L (ref 96–112)
CO2: 28 mEq/L (ref 19–32)
Calcium: 9.2 mg/dL (ref 8.4–10.5)
Creatinine, Ser: 2.55 mg/dL — ABNORMAL HIGH (ref 0.50–1.10)
GFR calc Af Amer: 18 mL/min — ABNORMAL LOW (ref >90)
GFR, EST NON AFRICAN AMERICAN: 16 mL/min — AB (ref >90)
Glucose, Bld: 158 mg/dL — ABNORMAL HIGH (ref 70–99)
Potassium: 5.1 mEq/L (ref 3.7–5.3)
SODIUM: 141 meq/L (ref 137–147)

## 2013-07-27 LAB — MRSA PCR SCREENING: MRSA BY PCR: NEGATIVE

## 2013-07-27 LAB — PROTIME-INR
INR: 2.11 — ABNORMAL HIGH (ref 0.00–1.49)
Prothrombin Time: 23 seconds — ABNORMAL HIGH (ref 11.6–15.2)

## 2013-07-27 LAB — GLUCOSE, CAPILLARY: GLUCOSE-CAPILLARY: 142 mg/dL — AB (ref 70–99)

## 2013-07-27 MED ORDER — SODIUM CHLORIDE 0.9 % IV SOLN
INTRAVENOUS | Status: DC
  Administered 2013-07-27: 20:00:00 via INTRAVENOUS

## 2013-07-27 MED ORDER — INSULIN GLARGINE 100 UNIT/ML ~~LOC~~ SOLN
12.0000 [IU] | Freq: Every day | SUBCUTANEOUS | Status: DC
Start: 2013-07-27 — End: 2013-08-02
  Administered 2013-07-27 – 2013-08-01 (×6): 12 [IU] via SUBCUTANEOUS
  Filled 2013-07-27 (×7): qty 0.12

## 2013-07-27 MED ORDER — FUROSEMIDE 10 MG/ML IJ SOLN
40.0000 mg | Freq: Once | INTRAMUSCULAR | Status: AC
  Administered 2013-07-27: 40 mg via INTRAVENOUS
  Filled 2013-07-27: qty 4

## 2013-07-27 MED ORDER — DILTIAZEM HCL ER COATED BEADS 240 MG PO CP24
ORAL_CAPSULE | ORAL | Status: AC
  Filled 2013-07-27: qty 1

## 2013-07-27 MED ORDER — CITALOPRAM HYDROBROMIDE 20 MG PO TABS
20.0000 mg | ORAL_TABLET | Freq: Every day | ORAL | Status: DC
  Administered 2013-07-28 – 2013-08-02 (×6): 20 mg via ORAL
  Filled 2013-07-27 (×6): qty 1

## 2013-07-27 MED ORDER — POLYETHYLENE GLYCOL 3350 17 G PO PACK: 17.0000 g | PACK | Freq: Every day | ORAL | Status: DC | PRN

## 2013-07-27 MED ORDER — ALBUTEROL SULFATE (2.5 MG/3ML) 0.083% IN NEBU
3.0000 mL | INHALATION_SOLUTION | RESPIRATORY_TRACT | Status: DC | PRN
  Administered 2013-07-29 – 2013-07-30 (×2): 3 mL via RESPIRATORY_TRACT
  Filled 2013-07-27 (×2): qty 3

## 2013-07-27 MED ORDER — ONDANSETRON HCL 4 MG PO TABS: 4.0000 mg | ORAL_TABLET | Freq: Four times a day (QID) | ORAL | Status: DC | PRN

## 2013-07-27 MED ORDER — FUROSEMIDE 10 MG/ML IJ SOLN
80.0000 mg | Freq: Two times a day (BID) | INTRAMUSCULAR | Status: DC
  Administered 2013-07-27 – 2013-07-29 (×4): 80 mg via INTRAVENOUS
  Filled 2013-07-27 (×4): qty 8

## 2013-07-27 MED ORDER — ACETAMINOPHEN 325 MG PO TABS: 650.0000 mg | ORAL_TABLET | Freq: Four times a day (QID) | ORAL | Status: DC | PRN

## 2013-07-27 MED ORDER — ATORVASTATIN CALCIUM 10 MG PO TABS
10.0000 mg | ORAL_TABLET | Freq: Every day | ORAL | Status: DC
  Administered 2013-07-27 – 2013-08-01 (×6): 10 mg via ORAL
  Filled 2013-07-27 (×6): qty 1

## 2013-07-27 MED ORDER — SODIUM CHLORIDE 0.9 % IJ SOLN: 3.0000 mL | INTRAMUSCULAR | Status: DC | PRN

## 2013-07-27 MED ORDER — CLONAZEPAM 0.5 MG PO TABS
0.5000 mg | ORAL_TABLET | Freq: Three times a day (TID) | ORAL | Status: DC | PRN
  Administered 2013-07-28 – 2013-08-01 (×5): 0.5 mg via ORAL
  Filled 2013-07-27 (×5): qty 1

## 2013-07-27 MED ORDER — CYANOCOBALAMIN 1000 MCG/ML IJ SOLN
1000.0000 ug | INTRAMUSCULAR | Status: DC
  Administered 2013-07-28: 1000 ug via INTRAMUSCULAR
  Filled 2013-07-27: qty 1

## 2013-07-27 MED ORDER — MIRTAZAPINE 15 MG PO TABS
7.5000 mg | ORAL_TABLET | Freq: Every day | ORAL | Status: DC
  Administered 2013-07-28 – 2013-08-01 (×5): 7.5 mg via ORAL
  Filled 2013-07-27: qty 0.5
  Filled 2013-07-27: qty 1
  Filled 2013-07-27 (×6): qty 0.5

## 2013-07-27 MED ORDER — ONDANSETRON HCL 4 MG/2ML IJ SOLN
4.0000 mg | Freq: Four times a day (QID) | INTRAMUSCULAR | Status: DC | PRN
  Administered 2013-07-30: 4 mg via INTRAVENOUS
  Filled 2013-07-27: qty 2

## 2013-07-27 MED ORDER — SODIUM CHLORIDE 0.9 % IV SOLN
250.0000 mL | INTRAVENOUS | Status: DC | PRN
  Administered 2013-07-28: 10 mL/h via INTRAVENOUS

## 2013-07-27 MED ORDER — SODIUM CHLORIDE 0.9 % IV SOLN
INTRAVENOUS | Status: DC
  Administered 2013-07-27: 15:00:00 via INTRAVENOUS

## 2013-07-27 MED ORDER — MUPIROCIN 2 % EX OINT
1.0000 "application " | TOPICAL_OINTMENT | Freq: Two times a day (BID) | CUTANEOUS | Status: DC
  Administered 2013-07-27 – 2013-08-02 (×12): 1 via TOPICAL
  Filled 2013-07-27 (×4): qty 22

## 2013-07-27 MED ORDER — SODIUM CHLORIDE 0.9 % IJ SOLN
3.0000 mL | Freq: Two times a day (BID) | INTRAMUSCULAR | Status: DC
Start: 2013-07-27 — End: 2013-08-02
  Administered 2013-07-27 – 2013-08-02 (×11): 3 mL via INTRAVENOUS

## 2013-07-27 MED ORDER — CALCIUM CARBONATE 1250 (500 CA) MG PO TABS
500.0000 mg | ORAL_TABLET | Freq: Two times a day (BID) | ORAL | Status: DC
  Administered 2013-07-27 – 2013-08-02 (×12): 500 mg via ORAL
  Filled 2013-07-27 (×14): qty 1

## 2013-07-27 MED ORDER — DILTIAZEM HCL ER COATED BEADS 240 MG PO CP24
240.0000 mg | ORAL_CAPSULE | Freq: Every day | ORAL | Status: DC
  Administered 2013-07-28 – 2013-08-02 (×5): 240 mg via ORAL
  Filled 2013-07-27 (×7): qty 1

## 2013-07-27 MED ORDER — WARFARIN - PHARMACIST DOSING INPATIENT: Status: DC

## 2013-07-27 MED ORDER — FERROUS SULFATE 325 (65 FE) MG PO TABS
325.0000 mg | ORAL_TABLET | Freq: Every day | ORAL | Status: DC
  Administered 2013-07-28 – 2013-08-02 (×6): 325 mg via ORAL
  Filled 2013-07-27 (×6): qty 1

## 2013-07-27 MED ORDER — VITAMIN D 1000 UNITS PO TABS
2000.0000 [IU] | ORAL_TABLET | Freq: Every day | ORAL | Status: DC
Start: 2013-07-28 — End: 2013-08-02
  Administered 2013-07-28 – 2013-08-02 (×6): 2000 [IU] via ORAL
  Filled 2013-07-27 (×6): qty 2

## 2013-07-27 MED ORDER — NITROGLYCERIN 2 % TD OINT
0.5000 [in_us] | TOPICAL_OINTMENT | Freq: Once | TRANSDERMAL | Status: AC
  Administered 2013-07-27: 0.5 [in_us] via TOPICAL
  Filled 2013-07-27: qty 1

## 2013-07-27 MED ORDER — INSULIN ASPART 100 UNIT/ML ~~LOC~~ SOLN
0.0000 [IU] | Freq: Three times a day (TID) | SUBCUTANEOUS | Status: DC
  Administered 2013-07-28 – 2013-08-01 (×7): 1 [IU] via SUBCUTANEOUS

## 2013-07-27 MED ORDER — WARFARIN SODIUM 2.5 MG PO TABS
2.5000 mg | ORAL_TABLET | ORAL | Status: AC
  Administered 2013-07-27: 2.5 mg via ORAL
  Filled 2013-07-27: qty 1

## 2013-07-27 MED ORDER — INSULIN ASPART 100 UNIT/ML ~~LOC~~ SOLN: 0.0000 [IU] | Freq: Every day | SUBCUTANEOUS | Status: DC

## 2013-07-27 MED ORDER — FLUOCINONIDE 0.05 % EX CREA
1.0000 "application " | TOPICAL_CREAM | Freq: Every day | CUTANEOUS | Status: DC
  Administered 2013-07-28 – 2013-08-02 (×6): 1 via TOPICAL
  Filled 2013-07-27: qty 30

## 2013-07-27 MED ORDER — POTASSIUM CHLORIDE CRYS ER 20 MEQ PO TBCR
40.0000 meq | EXTENDED_RELEASE_TABLET | Freq: Every day | ORAL | Status: DC
  Administered 2013-07-28 – 2013-08-02 (×6): 40 meq via ORAL
  Filled 2013-07-27: qty 4
  Filled 2013-07-27 (×5): qty 2

## 2013-07-27 MED ORDER — ALBUTEROL SULFATE (2.5 MG/3ML) 0.083% IN NEBU
2.5000 mg | INHALATION_SOLUTION | RESPIRATORY_TRACT | Status: AC
  Administered 2013-07-27 – 2013-07-28 (×3): 2.5 mg via RESPIRATORY_TRACT
  Filled 2013-07-27 (×3): qty 3

## 2013-07-27 MED ORDER — METOPROLOL TARTRATE 50 MG PO TABS
50.0000 mg | ORAL_TABLET | Freq: Two times a day (BID) | ORAL | Status: DC
  Administered 2013-07-27 – 2013-08-02 (×11): 50 mg via ORAL
  Filled 2013-07-27 (×12): qty 1

## 2013-07-27 MED ORDER — PANTOPRAZOLE SODIUM 40 MG PO TBEC
40.0000 mg | DELAYED_RELEASE_TABLET | Freq: Every day | ORAL | Status: DC
  Administered 2013-07-28 – 2013-08-02 (×6): 40 mg via ORAL
  Filled 2013-07-27 (×6): qty 1

## 2013-07-27 NOTE — ED Notes (Signed)
Pt comes from [facility] via EMS with c/o SOB. Upon EMS arrival pt was in respiratory distress with O2 sat 80. EMS applied bipap and pt "responded well". Pt now O2 of upper 90s per EMS. Pt is A&Ox4. Pt denies any pain but states "I've just been tired". Pt has hx of COPD.

## 2013-07-27 NOTE — ED Notes (Signed)
Pt taken off bipap due to discomfort. Pt now on nasal canula breathing comfortably.

## 2013-07-27 NOTE — H&P (Signed)
[facility] History and Physical  Catherine [name] BZJ:[id number] DOB: 18-Oct-1923 DOA: 07/27/2013  Referring physician: Dr. Thurnell Garbe, ER. PCP: Asencion Noble, MD   Chief Complaint: Dyspnea.  HPI: Catherine [name] is a 80 y.o. female  This is an 80 year old lady who has a history of diastolic congestive heart failure who presents with a 2 to three-day history of increasing dyspnea and swelling of her legs. She says that she has been compliant with her medications. She denies having any salty food. She denies any chest pain. She has a history of chronic atrial fibrillation for which she is on anticoagulation therapy with a relative. In the emergency room, she was found to have chronic kidney disease and worsening renal failure. She was also found to be in congestive heart failure. She is now being admitted for further therapy.   Review of Systems:  Constitutional:  No weight loss, night sweats, Fevers, chills, fatigue.  HEENT:  No headaches, Difficulty swallowing,Tooth/dental problems,Sore throat,  No sneezing, itching, ear ache, nasal congestion, post nasal drip,  Cardio-vascular:  No chest pain, Orthopnea, PND,  dizziness, palpitations  GI:  No heartburn, indigestion, abdominal pain, nausea, vomiting, diarrhea, change in bowel habits, loss of appetite  Resp:  No excess mucus, no productive cough, No non-productive cough, No coughing up of blood.No change in color of mucus.No wheezing.No chest wall deformity  Skin:  no rash or lesions.  GU:  no dysuria, change in color of urine, no urgency or frequency. No flank pain.  Musculoskeletal:  No joint pain or swelling. No decreased range of motion. No back pain.  Psych:  No change in mood or affect. No depression or anxiety. No memory loss.   Past Medical History  Diagnosis Date  . CHF (congestive heart failure)   . Diabetes mellitus   . Hypertension   . COPD (chronic obstructive pulmonary disease)   . Vertigo   . Cancer     breast with  mastectomy  . Stroke      patient denies  . Skin disorder      Epidermolysis bullosa.  . Pulmonary hypertension   . Atrial fibrillation 2011  . Multiple thyroid nodules   . Anemia   . Breast cancer   . Anemia of other chronic disease 06/09/2012  . Aortic stenosis   . Monoclonal gammopathy   . Hypothermia   . Diverticulosis   . Squamous cell carcinoma   . Osteoporosis, unspecified 02/24/2013  . Chronic kidney disease   . On home O2     2L N/C   Past Surgical History  Procedure Laterality Date  . Cholecystectomy    . Hemorrhoid surgery    . Abdominal hysterectomy  1991    complete hysterectomy for cancer  . Mastectomy  2005    left breast, tamoxifin  . Goiter    . Hip fracture surgery  2010    right hip replacement  . Hip fracture surgery  2006    left hip replacement  . Colonoscopy  ?    remote  . Cataract extraction, bilateral     Social History:  reports that she has never smoked. She has never used smokeless tobacco. She reports that she does not drink alcohol or use illicit drugs.  Allergies  Allergen Reactions  . Amoxicillin-Pot Clavulanate Diarrhea    Family History  Problem Relation Age of Onset  . Colon cancer Neg Hx   . Heart attack Son     age 84s, suspected MI  . Diabetes  Mother      Prior to Admission medications   Medication Sig Start Date End Date Taking? Authorizing Provider  atorvastatin (LIPITOR) 10 MG tablet Take 10 mg by mouth at bedtime.   Yes Historical Provider, MD  cholecalciferol (VITAMIN D) 1000 UNITS tablet Take 2,000 Units by mouth daily.   Yes Historical Provider, MD  citalopram (CELEXA) 20 MG tablet Take 20 mg by mouth daily.   Yes Historical Provider, MD  clonazePAM (KLONOPIN) 0.5 MG tablet Take 0.5 mg by mouth 3 (three) times daily as needed for anxiety.   Yes Historical Provider, MD  cyanocobalamin (,VITAMIN B-12,) 1000 MCG/ML injection Inject 1,000 mcg into the muscle every 30 (thirty) days.   Yes Historical Provider, MD    diltiazem (DILACOR XR) 240 MG 24 hr capsule Take 240 mg by mouth daily.   Yes Historical Provider, MD  ferrous sulfate 325 (65 FE) MG tablet Take 325 mg by mouth daily with breakfast.   Yes Historical Provider, MD  fluocinonide cream (LIDEX) 4.40 % Apply 1 application topically daily. Apply sparingly to itchy areas on trunk & extremeties   Yes Historical Provider, MD  furosemide (LASIX) 80 MG tablet Take 1 tablet (80 mg total) by mouth 2 (two) times daily. 06/26/13  Yes Asencion Noble, MD  insulin glargine (LANTUS) 100 UNIT/ML injection Inject 12 Units into the skin daily.   Yes Historical Provider, MD  metoprolol (LOPRESSOR) 50 MG tablet Take 50 mg by mouth 2 (two) times daily.   Yes Historical Provider, MD  mirtazapine (REMERON) 15 MG tablet Take 7.5 mg by mouth at bedtime.    Yes Historical Provider, MD  mupirocin ointment (BACTROBAN) 2 % Apply 1 application topically 2 (two) times daily. Apply to open wounds on hips & thighs   Yes Historical Provider, MD  Oyster Shell (OYSTER CALCIUM) 500 MG TABS tablet Take 500 mg of elemental calcium by mouth 2 (two) times daily.   Yes Historical Provider, MD  pantoprazole (PROTONIX) 40 MG tablet Take 40 mg by mouth daily.   Yes Historical Provider, MD  potassium chloride SA (K-DUR,KLOR-CON) 20 MEQ tablet Take 40 mEq by mouth daily.    Yes Historical Provider, MD  Rivaroxaban (XARELTO) 15 MG TABS tablet Take 15 mg by mouth daily with supper.   Yes Historical Provider, MD  acetaminophen (TYLENOL) 325 MG tablet Take 650 mg by mouth every 6 (six) hours as needed.    Historical Provider, MD  albuterol (PROVENTIL HFA;VENTOLIN HFA) 108 (90 BASE) MCG/ACT inhaler Inhale 2 puffs into the lungs every 4 (four) hours as needed for wheezing or shortness of breath.     Historical Provider, MD  polyethylene glycol (MIRALAX / GLYCOLAX) packet Take 17 g by mouth daily as needed for mild constipation.    Historical Provider, MD   Physical Exam: Filed Vitals:   07/27/13 1730  BP:  104/89  Pulse:   Temp:   Resp: 20    BP 104/89  Pulse 74  Temp(Src) 97.5 F (36.4 C) (Oral)  Resp 20  SpO2 100%  General:  Appears anxious. There is no increased work of breathing although she apparently was on BiPAP when she was admitted to the ER initially. She no longer is. There is no peripheral or central cyanosis. Eyes: PERRL, normal lids, irises & conjunctiva ENT: grossly normal hearing, lips & tongue Neck: no LAD, masses or thyromegaly Cardiovascular heart sounds are present with systolic murmur. Jugular venous pressure is not elevated. There is peripheral pitting edema in her  lower legs from the knee downwards. Telemetry: SR, no arrhythmias  Respiratory: There are a few crackles  in both bases. . Normal respiratory effort. Abdomen: soft, ntnd Skin: no rash or induration seen on limited exam Musculoskeletal: grossly normal tone BUE/BLE Psychiatric: grossly normal mood and affect, speech fluent and appropriate Neurologic: grossly non-focal.          Labs on Admission:  Basic Metabolic Panel:  Recent Labs Lab 07/27/13 1503  NA 141  K 5.1  CL 100  CO2 28  GLUCOSE 158*  BUN 66*  CREATININE 2.55*  CALCIUM 9.2       CBC:  Recent Labs Lab 07/27/13 1503  WBC 4.2  NEUTROABS 3.2  HGB 10.6*  HCT 33.7*  MCV 105.3*  PLT 163   Cardiac Enzymes:  Recent Labs Lab 07/27/13 1503  TROPONINI <0.30    BNP (last 3 results)  Recent Labs  10/22/12 1540 06/21/13 1620 07/27/13 1503  PROBNP 6911.00* 9750.0* 12316.0*   CBG: No results found for this basename: GLUCAP,  in the last 168 hours  Radiological Exams on Admission: Dg Chest Port 1 View  07/27/2013   CLINICAL DATA:  Shortness of breath, low oxygen saturation  EXAM: PORTABLE CHEST - 1 VIEW  COMPARISON:  DG CHEST 1V PORT dated 06/21/2013; DG ABD ACUTE W/CHEST dated 06/10/2013; DG CHEST 1V PORT dated 12/10/2012  FINDINGS: Mild cardiac enlargement and aortic calcifications stable. Limited inspiratory  effect. Significant vascular congestion. Peribronchial cuffing with interstitial prominence. More hazy airspace opacity right lower lobe.  IMPRESSION: Congestive heart failure with a mixture of diffuse interstitial and right lower lobe alveolar edema.   Electronically Signed   By: Skipper Cliche M.D.   On: 07/27/2013 14:49    EKG: Independently reviewed. Wide-complex rhythm, right bundle branch block, appearances are probably consistent with atrial fibrillation as the rhythm.  Assessment/Plan   1. Acute on chronic diastolic congestive heart failure. 2. Acute on chronic kidney disease. 3. Hypertension. 4. Chronic atrial fibrillation. 5. Type 2 diabetes mellitus.  Plan: 1. Admit to step down unit. 2. Intravenous Lasix for diuresis. 3. Daily weights. 4. Monitor input and output carefully. 5. Nephrology consultation in view of the worsening renal function. 6. Sliding scale insulin for diabetes.   Other recommendations will depend on patient's hospital progress.   Code Status: Full code.  Family Communication: Discuss plan with patient at the bedside.  Disposition Plan: Home when medically stable.   Time spent: 45 minutes.  Doree Albee [facility] Pager [phone]

## 2013-07-27 NOTE — ED Provider Notes (Signed)
CSN: [id number]     Arrival date & time 07/27/13  1416 History   First MD Initiated Contact with Patient 07/27/13 1426     Chief Complaint  Patient presents with  . Shortness of Breath      Patient is a 80 y.o. female presenting with shortness of breath. The history is provided by the patient, the nursing home and the EMS personnel. The history is limited by the condition of the patient (Hx dementia).  Shortness of Breath Pt was seen at 1425.  Per EMS, NH report and pt:  NH staff states they noticed pt was "SOB" when they checked on her PTA. EMS noted pt's O2 Sats to be "80%" on their arrival to the scene. EMS placed pt on Bipap with O2 Sats increasing to "upper 90's." Pt states the Bipap "made me feel better." States she currently "just feels tired." Denies CP/palpitations, no cough, no abd pain, no N/V/D, no back pain, no fevers.     Past Medical History  Diagnosis Date  . CHF (congestive heart failure)   . Diabetes mellitus   . Hypertension   . COPD (chronic obstructive pulmonary disease)   . Vertigo   . Cancer     breast with mastectomy  . Stroke      patient denies  . Skin disorder      Epidermolysis bullosa.  . Pulmonary hypertension   . Atrial fibrillation 2011  . Multiple thyroid nodules   . Anemia   . Breast cancer   . Anemia of other chronic disease 06/09/2012  . Aortic stenosis   . Monoclonal gammopathy   . Hypothermia   . Diverticulosis   . Squamous cell carcinoma   . Osteoporosis, unspecified 02/24/2013  . Chronic kidney disease   . On home O2     2L N/C   Past Surgical History  Procedure Laterality Date  . Cholecystectomy    . Hemorrhoid surgery    . Abdominal hysterectomy  1991    complete hysterectomy for cancer  . Mastectomy  2005    left breast, tamoxifin  . Goiter    . Hip fracture surgery  2010    right hip replacement  . Hip fracture surgery  2006    left hip replacement  . Colonoscopy  ?    remote  . Cataract extraction, bilateral      Family History  Problem Relation Age of Onset  . Colon cancer Neg Hx   . Heart attack Son     age 46s, suspected MI  . Diabetes Mother    History  Substance Use Topics  . Smoking status: Never Smoker   . Smokeless tobacco: Never Used  . Alcohol Use: No    Review of Systems  Unable to perform ROS: Dementia  Respiratory: Positive for shortness of breath.       Allergies  Amoxicillin-pot clavulanate  Home Medications   Current Outpatient Rx  Name  Route  Sig  Dispense  Refill  . acetaminophen (TYLENOL) 325 MG tablet   Oral   Take 650 mg by mouth every 6 (six) hours as needed.         Marland Kitchen albuterol (PROVENTIL HFA;VENTOLIN HFA) 108 (90 BASE) MCG/ACT inhaler   Inhalation   Inhale 2 puffs into the lungs every 4 (four) hours as needed for wheezing or shortness of breath.          Marland Kitchen atorvastatin (LIPITOR) 10 MG tablet   Oral   Take 10 mg  by mouth at bedtime.         . cholecalciferol (VITAMIN D) 1000 UNITS tablet   Oral   Take 2,000 Units by mouth daily.         . citalopram (CELEXA) 20 MG tablet   Oral   Take 20 mg by mouth daily.         . clonazePAM (KLONOPIN) 0.5 MG tablet   Oral   Take 0.5 mg by mouth 3 (three) times daily as needed for anxiety.         . cyanocobalamin (,VITAMIN B-12,) 1000 MCG/ML injection   Intramuscular   Inject 1,000 mcg into the muscle every 30 (thirty) days.         Marland Kitchen diltiazem (DILACOR XR) 240 MG 24 hr capsule   Oral   Take 240 mg by mouth daily.         . ferrous sulfate 325 (65 FE) MG tablet   Oral   Take 325 mg by mouth daily with breakfast.         . fluocinonide cream (LIDEX) 0.05 %   Topical   Apply 1 application topically daily. Apply sparingly to itchy areas on trunk & extremeties         . furosemide (LASIX) 80 MG tablet   Oral   Take 1 tablet (80 mg total) by mouth 2 (two) times daily.   60 tablet   12   . insulin glargine (LANTUS) 100 UNIT/ML injection   Subcutaneous   Inject 12 Units  into the skin daily.         . metoprolol (LOPRESSOR) 50 MG tablet   Oral   Take 50 mg by mouth 2 (two) times daily.         . mirtazapine (REMERON) 15 MG tablet   Oral   Take 7.5 mg by mouth at bedtime.          . mupirocin ointment (BACTROBAN) 2 %   Topical   Apply 1 application topically 2 (two) times daily. Apply to open wounds on hips & thighs         . Oyster Shell (OYSTER CALCIUM) 500 MG TABS tablet   Oral   Take 500 mg of elemental calcium by mouth 2 (two) times daily.         . pantoprazole (PROTONIX) 40 MG tablet   Oral   Take 40 mg by mouth daily.         . polyethylene glycol (MIRALAX / GLYCOLAX) packet   Oral   Take 17 g by mouth daily as needed for mild constipation.         . potassium chloride SA (K-DUR,KLOR-CON) 20 MEQ tablet   Oral   Take 40 mEq by mouth daily.          . Rivaroxaban (XARELTO) 15 MG TABS tablet   Oral   Take 15 mg by mouth daily with supper.          BP 96/75  Pulse 83  Temp(Src) 97.5 F (36.4 C) (Oral)  Resp 17  SpO2 100% Physical Exam 1430: Physical examination:  Nursing notes reviewed; Vital signs and O2 SAT reviewed;  Constitutional: Well developed, Well nourished, Uncomfortable appearing.; Head:  Normocephalic, atraumatic; Eyes: EOMI, PERRL, No scleral icterus; ENMT: Mouth and pharynx normal, Mucous membranes dry; Neck: Supple, Full range of motion, No lymphadenopathy; Cardiovascular: Regular rate and rhythm, No gallop; Respiratory: Breath sounds coarse & equal bilaterally, faint scattered wheezes. No audible wheezing. Speaking  in sentences, Tachypneic, No retrax. Sitting upright.; Chest: Nontender, Movement normal; Abdomen: Soft, Nontender, Nondistended, Normal bowel sounds; Genitourinary: No CVA tenderness; Extremities: Pulses normal, No tenderness, +1 pedal edema bilat without calf asymmetry, +chronic stasis changes bilat LE's.; Neuro: Awake, alert, mildly confused re: events per hx dementia. Major CN grossly  intact.  Speech clear. No gross focal motor or sensory deficits in extremities.; Skin: Color pale, Warm, Dry.   ED Course  Procedures     EKG Interpretation   Date/Time:  Tuesday July 27 2013 14:19:40 EDT Ventricular Rate:  88 PR Interval:    QRS Duration: 128 QT Interval:  426 QTC Calculation: 515 R Axis:   131 Text Interpretation:  Wide QRS rhythm Right bundle branch block Anterior  infarct , age undetermined Abnormal ECG When compared with ECG of  21-Jun-2013 17:29, Wide QRS rhythm has replaced Atrial fibrillation  Confirmed by [facility]  MD, Nunzio Cory [phone]) on 07/27/2013 2:37:50 PM      MDM  MDM Reviewed: previous chart, nursing note and vitals Reviewed previous: labs and ECG Interpretation: labs, ECG and x-ray Total time providing critical care: 30-74 minutes. This excludes time spent performing separately reportable procedures and services. Consults: admitting MD   CRITICAL CARE Performed by: Alfonzo Feller Total critical care time: 45 Critical care time was exclusive of separately billable procedures and treating other patients. Critical care was necessary to treat or prevent imminent or life-threatening deterioration. Critical care was time spent personally by me on the following activities: development of treatment plan with patient and/or surrogate as well as nursing, discussions with consultants, evaluation of patient's response to treatment, examination of patient, obtaining history from patient or surrogate, ordering and performing treatments and interventions, ordering and review of laboratory studies, ordering and review of radiographic studies, pulse oximetry and re-evaluation of patient's condition.   Dg Chest Port 1 View 07/27/2013   CLINICAL DATA:  Shortness of breath, low oxygen saturation  EXAM: PORTABLE CHEST - 1 VIEW  COMPARISON:  DG CHEST 1V PORT dated 06/21/2013; DG ABD ACUTE W/CHEST dated 06/10/2013; DG CHEST 1V PORT dated 12/10/2012  FINDINGS: Mild  cardiac enlargement and aortic calcifications stable. Limited inspiratory effect. Significant vascular congestion. Peribronchial cuffing with interstitial prominence. More hazy airspace opacity right lower lobe.  IMPRESSION: Congestive heart failure with a mixture of diffuse interstitial and right lower lobe alveolar edema.   Electronically Signed   By: Skipper Cliche M.D.   On: 07/27/2013 14:49    Results for orders placed during the hospital encounter of 07/27/13  URINALYSIS, ROUTINE W REFLEX MICROSCOPIC      Result Value Ref Range   Color, Urine YELLOW  YELLOW   APPearance CLEAR  CLEAR   Specific Gravity, Urine 1.020  1.005 - 1.030   pH 5.5  5.0 - 8.0   Glucose, UA NEGATIVE  NEGATIVE mg/dL   Hgb urine dipstick LARGE (*) NEGATIVE   Bilirubin Urine NEGATIVE  NEGATIVE   Ketones, ur NEGATIVE  NEGATIVE mg/dL   Protein, ur 30 (*) NEGATIVE mg/dL   Urobilinogen, UA 0.2  0.0 - 1.0 mg/dL   Nitrite NEGATIVE  NEGATIVE   Leukocytes, UA TRACE (*) NEGATIVE  BASIC METABOLIC PANEL      Result Value Ref Range   Sodium 141  137 - 147 mEq/L   Potassium 5.1  3.7 - 5.3 mEq/L   Chloride 100  96 - 112 mEq/L   CO2 28  19 - 32 mEq/L   Glucose, Bld 158 (*) 70 - 99 mg/dL  BUN 66 (*) 6 - 23 mg/dL   Creatinine, Ser 2.55 (*) 0.50 - 1.10 mg/dL   Calcium 9.2  8.4 - 10.5 mg/dL   GFR calc non Af Amer 16 (*) >90 mL/min   GFR calc Af Amer 18 (*) >90 mL/min  CBC WITH DIFFERENTIAL      Result Value Ref Range   WBC 4.2  4.0 - 10.5 K/uL   RBC 3.20 (*) 3.87 - 5.11 MIL/uL   Hemoglobin 10.6 (*) 12.0 - 15.0 g/dL   HCT 33.7 (*) 36.0 - 46.0 %   MCV 105.3 (*) 78.0 - 100.0 fL   MCH 33.1  26.0 - 34.0 pg   MCHC 31.5  30.0 - 36.0 g/dL   RDW 15.6 (*) 11.5 - 15.5 %   Platelets 163  150 - 400 K/uL   Neutrophils Relative % 75  43 - 77 %   Neutro Abs 3.2  1.7 - 7.7 K/uL   Lymphocytes Relative 14  12 - 46 %   Lymphs Abs 0.6 (*) 0.7 - 4.0 K/uL   Monocytes Relative 7  3 - 12 %   Monocytes Absolute 0.3  0.1 - 1.0 K/uL    Eosinophils Relative 4  0 - 5 %   Eosinophils Absolute 0.2  0.0 - 0.7 K/uL   Basophils Relative 0  0 - 1 %   Basophils Absolute 0.0  0.0 - 0.1 K/uL  TROPONIN I      Result Value Ref Range   Troponin I <0.30  <0.30 ng/mL  PRO B NATRIURETIC PEPTIDE      Result Value Ref Range   Pro B Natriuretic peptide (BNP) 12316.0 (*) 0 - 450 pg/mL  URINE MICROSCOPIC-ADD ON      Result Value Ref Range   Squamous Epithelial / LPF FEW (*) RARE   WBC, UA 0-2  <3 WBC/hpf   RBC / HPF TOO NUMEROUS TO COUNT  <3 RBC/hpf   Bacteria, UA FEW (*) RARE   Dg Chest Port 1 View 07/27/2013   CLINICAL DATA:  Shortness of breath, low oxygen saturation  EXAM: PORTABLE CHEST - 1 VIEW  COMPARISON:  DG CHEST 1V PORT dated 06/21/2013; DG ABD ACUTE W/CHEST dated 06/10/2013; DG CHEST 1V PORT dated 12/10/2012  FINDINGS: Mild cardiac enlargement and aortic calcifications stable. Limited inspiratory effect. Significant vascular congestion. Peribronchial cuffing with interstitial prominence. More hazy airspace opacity right lower lobe.  IMPRESSION: Congestive heart failure with a mixture of diffuse interstitial and right lower lobe alveolar edema.   Electronically Signed   By: Skipper Cliche M.D.   On: 07/27/2013 14:49    Results for Garmon, Catherine [name] (MRN [id number]) as of 07/27/2013 17:43  Ref. Range 06/22/2013 04:16 06/23/2013 04:47 06/24/2013 04:44 06/26/2013 05:49 07/27/2013 15:03  BUN Latest Range: 6-23 mg/dL 50 (H) 48 (H) 47 (H) 42 (H) 66 (H)  Creatinine Latest Range: 0.50-1.10 mg/dL 1.90 (H) 2.02 (H) 2.00 (H) 1.82 (H) 2.55 (H)    Results for [name], Catherine (MRN [id number]) as of 07/27/2013 17:43  Ref. Range 10/06/2012 00:50 10/09/2012 04:45 10/22/2012 15:40 06/21/2013 16:20 07/27/2013 15:03  Pro B Natriuretic peptide (BNP) Latest Range: 0-450 pg/mL 4853.0 (H) 4038.0 (H) 6911.00 (H) 9750.0 (H) 12316.0 (H)    1725:  On arrival, pt tachypneic off EMS Bipap. Lungs coarse, Sats low 90's on O2 N/C. Bipap restarted, ntg paste applied and IV lasix  given. Pt with gradual improvement. Bipap weaned off to O2 N/C. Pt maintaining Sats mid-90's, resps without distress, lungs  coarse bilat.  T/C to [facility]. Anastasio Champion, case discussed, including:  HPI, pertinent PM/SHx, VS/PE, dx testing, ED course and treatment:  Agreeable to admit, requests to write temporary orders, obtain stepdown bed.     Alfonzo Feller, DO 07/28/13 1359

## 2013-07-27 NOTE — Progress Notes (Signed)
ANTICOAGULATION CONSULT NOTE - Initial Consult  Pharmacy Consult for Coumadin Indication: atrial fibrillation  Allergies  Allergen Reactions  . Amoxicillin-Pot Clavulanate Diarrhea    Patient Measurements: Height: 5\' 3"  (160 cm) Weight: 166 lb 0.1 oz (75.3 kg) IBW/kg (Calculated) : 52.4  Vital Signs: Temp: 97.7 F (36.5 C) (03/31 1850) Temp src: Oral (03/31 1426) BP: 93/61 mmHg (03/31 1900) Pulse Rate: 88 (03/31 1845)  Labs:  Recent Labs  07/27/13 1503 07/27/13 1902 07/27/13 1909  HGB 10.6*  --   --   HCT 33.7*  --   --   PLT 163  --   --   LABPROT  --   --  23.0*  INR  --   --  2.11*  CREATININE 2.55*  --   --   TROPONINI <0.30 <0.30  --     Estimated Creatinine Clearance: 15.1 ml/min (by C-G formula based on Cr of 2.55).   Medical History: Past Medical History  Diagnosis Date  . CHF (congestive heart failure)   . Diabetes mellitus   . Hypertension   . COPD (chronic obstructive pulmonary disease)   . Vertigo   . Cancer     breast with mastectomy  . Stroke      patient denies  . Skin disorder      Epidermolysis bullosa.  . Pulmonary hypertension   . Atrial fibrillation 2011  . Multiple thyroid nodules   . Anemia   . Breast cancer   . Anemia of other chronic disease 06/09/2012  . Aortic stenosis   . Monoclonal gammopathy   . Hypothermia   . Diverticulosis   . Squamous cell carcinoma   . Osteoporosis, unspecified 02/24/2013  . Chronic kidney disease   . On home O2     2L N/C    Medications:  Prescriptions prior to admission  Medication Sig Dispense Refill  . atorvastatin (LIPITOR) 10 MG tablet Take 10 mg by mouth at bedtime.      . cholecalciferol (VITAMIN D) 1000 UNITS tablet Take 2,000 Units by mouth daily.      . citalopram (CELEXA) 20 MG tablet Take 20 mg by mouth daily.      . clonazePAM (KLONOPIN) 0.5 MG tablet Take 0.5 mg by mouth 3 (three) times daily as needed for anxiety.      . cyanocobalamin (,VITAMIN B-12,) 1000 MCG/ML injection  Inject 1,000 mcg into the muscle every 30 (thirty) days.      Marland Kitchen diltiazem (DILACOR XR) 240 MG 24 hr capsule Take 240 mg by mouth daily.      . ferrous sulfate 325 (65 FE) MG tablet Take 325 mg by mouth daily with breakfast.      . fluocinonide cream (LIDEX) 2.67 % Apply 1 application topically daily. Apply sparingly to itchy areas on trunk & extremeties      . furosemide (LASIX) 80 MG tablet Take 1 tablet (80 mg total) by mouth 2 (two) times daily.  60 tablet  12  . insulin glargine (LANTUS) 100 UNIT/ML injection Inject 12 Units into the skin daily.      . metoprolol (LOPRESSOR) 50 MG tablet Take 50 mg by mouth 2 (two) times daily.      . mirtazapine (REMERON) 15 MG tablet Take 7.5 mg by mouth at bedtime.       . mupirocin ointment (BACTROBAN) 2 % Apply 1 application topically 2 (two) times daily. Apply to open wounds on hips & thighs      . [employer] (  OYSTER CALCIUM) 500 MG TABS tablet Take 500 mg of elemental calcium by mouth 2 (two) times daily.      . pantoprazole (PROTONIX) 40 MG tablet Take 40 mg by mouth daily.      . potassium chloride SA (K-DUR,KLOR-CON) 20 MEQ tablet Take 40 mEq by mouth daily.       . Rivaroxaban (XARELTO) 15 MG TABS tablet Take 15 mg by mouth daily with supper.      Marland Kitchen acetaminophen (TYLENOL) 325 MG tablet Take 650 mg by mouth every 6 (six) hours as needed.      Marland Kitchen albuterol (PROVENTIL HFA;VENTOLIN HFA) 108 (90 BASE) MCG/ACT inhaler Inhale 2 puffs into the lungs every 4 (four) hours as needed for wheezing or shortness of breath.       . polyethylene glycol (MIRALAX / GLYCOLAX) packet Take 17 g by mouth daily as needed for mild constipation.        Assessment: 80 yo F with Afib who was on Xarelto PTA.  She has ARF-estimated CrCl ~55ml/min which is recommended cutoff for Xarelto administration.  Order start Coumadin.  INR<3.  No bleeding noted.   Goal of Therapy:  INR 2-3   Plan:  Coumadin 2.5mg  po x1 tonight Daily INR F/U renal function & further  anticoagulation plans  Biagio Borg 07/27/2013,9:43 PM

## 2013-07-28 ENCOUNTER — Inpatient Hospital Stay (HOSPITAL_COMMUNITY): Payer: Medicare Other

## 2013-07-28 DIAGNOSIS — I5033 Acute on chronic diastolic (congestive) heart failure: Principal | ICD-10-CM

## 2013-07-28 DIAGNOSIS — I369 Nonrheumatic tricuspid valve disorder, unspecified: Secondary | ICD-10-CM

## 2013-07-28 LAB — URINE CULTURE
Colony Count: NO GROWTH
Culture: NO GROWTH

## 2013-07-28 LAB — COMPREHENSIVE METABOLIC PANEL
ALK PHOS: 67 U/L (ref 39–117)
ALT: 6 U/L (ref 0–35)
AST: 12 U/L (ref 0–37)
Albumin: 2.7 g/dL — ABNORMAL LOW (ref 3.5–5.2)
BUN: 62 mg/dL — ABNORMAL HIGH (ref 6–23)
CO2: 25 mEq/L (ref 19–32)
Calcium: 7.9 mg/dL — ABNORMAL LOW (ref 8.4–10.5)
Chloride: 106 mEq/L (ref 96–112)
Creatinine, Ser: 2.22 mg/dL — ABNORMAL HIGH (ref 0.50–1.10)
GFR calc non Af Amer: 19 mL/min — ABNORMAL LOW (ref >90)
GFR, EST AFRICAN AMERICAN: 22 mL/min — AB (ref >90)
GLUCOSE: 157 mg/dL — AB (ref 70–99)
POTASSIUM: 4 meq/L (ref 3.7–5.3)
Sodium: 143 mEq/L (ref 137–147)
Total Bilirubin: 0.3 mg/dL (ref 0.3–1.2)
Total Protein: 6 g/dL (ref 6.0–8.3)

## 2013-07-28 LAB — PHOSPHORUS: Phosphorus: 3.8 mg/dL (ref 2.3–4.6)

## 2013-07-28 LAB — PROTEIN, URINE, RANDOM: Total Protein, Urine: 9 mg/dL

## 2013-07-28 LAB — GLUCOSE, CAPILLARY
GLUCOSE-CAPILLARY: 124 mg/dL — AB (ref 70–99)
Glucose-Capillary: 90 mg/dL (ref 70–99)
Glucose-Capillary: 144 mg/dL — ABNORMAL HIGH (ref 70–99)
Glucose-Capillary: 155 mg/dL — ABNORMAL HIGH (ref 70–99)

## 2013-07-28 LAB — CREATININE, URINE, RANDOM: Creatinine, Urine: 30.63 mg/dL

## 2013-07-28 LAB — TROPONIN I
Troponin I: <0.3 ng/mL (ref <0.30)
Troponin I: <0.3 ng/mL (ref <0.30)

## 2013-07-28 MED ORDER — RIVAROXABAN 15 MG PO TABS
15.0000 mg | ORAL_TABLET | Freq: Every day | ORAL | Status: DC
  Administered 2013-07-28 – 2013-07-29 (×2): 15 mg via ORAL
  Filled 2013-07-28 (×2): qty 1

## 2013-07-28 MED ORDER — HYDROXYZINE HCL 10 MG PO TABS
10.0000 mg | ORAL_TABLET | ORAL | Status: DC | PRN
Start: 2013-07-28 — End: 2013-08-02
  Administered 2013-07-29 (×2): 10 mg via ORAL
  Filled 2013-07-28: qty 1

## 2013-07-28 NOTE — Care Management Note (Addendum)
    Page 1 of 1   08/02/2013     1:41:49 PM   CARE MANAGEMENT NOTE 08/02/2013  Patient:  Catherine [name], Catherine [name]   Account Number:  [id number]  Date Initiated:  07/28/2013  Documentation initiated by:  Theophilus Kinds  Subjective/Objective Assessment:   Pt admitted from [facility]F. Pt will return to facility at discharge.     Action/Plan:   CSW to arrange discharge to facility when medically stable.   Anticipated DC Date:  08/02/2013   Anticipated DC Plan:  ASSISTED LIVING / REST HOME  In-house referral  Clinical Social Worker      DC Planning Services  CM consult      Choice offered to / List presented to:          [facility] arranged  HH-1 RN      [place].   Status of service:  Completed, signed off Medicare Important Message given?  YES (If response is "NO", the following Medicare IM given date fields will be blank) Date Medicare IM given:  08/02/2013 Date Additional Medicare IM given:    Discharge Disposition:  ASSISTED LIVING  Per UR Regulation:    If discussed at Long Length of Stay Meetings, dates discussed:    Comments:  08/02/13 Claretha Cooper RN BSN CM Romualdo Bolk, Ms [facility], notified of DC and resumption of [facility] RN  07/28/13 [place], RN BSN CM

## 2013-07-28 NOTE — Progress Notes (Signed)
*  PRELIMINARY RESULTS* Echocardiogram 2D Echocardiogram has been performed.  [place], [place] 07/28/2013, 3:45 PM

## 2013-07-28 NOTE — Clinical Social Work Psychosocial (Signed)
Clinical Social Work Department BRIEF PSYCHOSOCIAL ASSESSMENT 07/28/2013  Patient:  Catherine [name], Catherine [name]     Account Number:  [id number]     Admit date:  07/27/2013  Clinical Social Worker:  Edwyna Shell, [place]  Date/Time:  07/28/2013 11:30 AM  Referred by:  CSW  Date Referred:  07/28/2013 Referred for  ALF Placement   Other Referral:   Interview type:  Patient Other interview type:   Also spoke w Marchia Meiers ALF administrator    PSYCHOSOCIAL DATA Living Status:  FACILITY Admitted from facility:  [facility] Level of care:  Assisted Living Primary support name:  Sandre Kitty, [place] Primary support relationship to patient:  FAMILY Degree of support available:   Has supportive family, children, grandchlidren and great grandchlidren. Has POA - Sandre Kitty [phone]).  Lives in [place], facility has contacted her regarding this admission.    CURRENT CONCERNS Current Concerns  Post-Acute Placement   Other Concerns:    SOCIAL WORK ASSESSMENT / PLAN CSW met w patient at bedside, patient short of breath but pleasant.  Answered brief questions from [place].  Said son died 4 years ago of "massive heart attack."  Per Tammy at [facility], patient speaks of him often and is "still depressed" about his death.  Patient relates that she has good family support from "my in laws, grandchildren and great grandchildren."  Appears willing to return to [facility] although [place] staff is aware that "she would rather return home, but knows that's impossible for her at this point."    Has lived at [facility] approx 1 year.  [place] assists w all ADLs due to patient weakness, help her w bathing, dressing, walking, toileting.  Can feed herself.  MD expressed concern that patient may not be following optimal diet as she reports she eats bacon, sausage and ham in the ALF dining room.  CSW spoke w facility administrator, she confirmed that bacon and  sausage are available to patient at breakfast (can be switched to [place] bacon and sausage if MD orders).  Is on no concentrated sweets diet at MD order.  Facility does not cook w salt, does not provide salt at table.  Do not deep fry foods, may cook chicken in oven w Pam spray and seasoned flour to provide small crust to chicken.  Steam or cook vegetables on stove top.  Do not serve ham.  Facility is not aware that anyone is bringing food from the outside to patient, but cannot control this - in dining room, they serve menus developed by nutritionist.    Patient on 2 L of O2 at facility, gotten from [employer].  Has concentrator and "fanny pack" for use when mobile.  When patient was in bathroom, she felt short of breath and called for staff assistance.  Staff found that her O2 was low, provided additional O2 tank, later checked her O2 sats and found that they were low enough to warrant going to [facility] ED.  Patient subsequently admitted to [facility].    Facility willing to take patient back at discharge. Will transport to facility.   Says patient has POA ([place], Bixby Groom) - they have informed her of patient admission.  CSW did not contact Ms Burt Ek - asked patient if she had any family she would like CSW to contact and patient said "they all know Im here."  CSW will continue to monitor and keep ALF informed of patient needs at discharge.  Assessment/plan status:  Psychosocial Support/Ongoing Assessment of Needs Other assessment/ plan:   Information/referral to community resources:   Return to [employer], no further information needed    PATIENT'S/FAMILY'S RESPONSE TO PLAN OF CARE: Patient short of breath, agreeable to return to [facility], spoke fondly of her family and their support for her.     Edwyna Shell, LCSW Clinical Social Worker [phone])

## 2013-07-28 NOTE — Progress Notes (Signed)
UR chart review completed.  

## 2013-07-28 NOTE — Progress Notes (Signed)
On call cardiology & on call nephrology notified of questionable abnormal pending echoecardiogram &renal ultrasound respectively .

## 2013-07-28 NOTE — Progress Notes (Signed)
NAME:  Catherine, [name]                   ACCOUNT NO.:  [id number]  MEDICAL RECORD NO.:  [id number]  LOCATION:  IC04                          FACILITY:  [facility]  PHYSICIAN:  Paula Compton. Willey Blade, MD       DATE OF BIRTH:  02/22/1924  DATE OF PROCEDURE:  07/28/2013 DATE OF DISCHARGE:                                PROGRESS NOTE   SUBJECTIVE:  Catherine [name] was admitted yesterday for recurrent heart failure. She has a normal ejection fraction of 55%-60% based on her echo last year.  She has had treatment with 80 mg of IV Lasix twice a day. Yesterday, she became more short of breath.  She has had more swelling in her extremities in the last few days.  She has chronic kidney disease as well with a BUN and creatinine of 62 and 2.22 now.  Her troponins have been negative.  Her proBNP was 12,361 yesterday.  She has been treated with IV Lasix and is feeling better today.  She is oxygenating in the mid 90s on a nasal cannula at 4.5 L.  Chest x-ray reveals pulmonary edema.  OBJECTIVE:  VITAL SIGNS:  Temperature 98, pulse 93, respirations 18, blood pressure 103/69. LUNGS:  Basilar rales. HEART:  Irregularly irregular with a grade 2 systolic murmur. ABDOMEN:  Nontender, mildly distended. EXTREMITIES:  Reveal edema in the arms and legs.  IMPRESSION/PLAN: 1. Acute on chronic diastolic heart failure.  Continue IV Lasix.     Consult Cardiology again. 2. Chronic atrial fibrillation.  Continue Xarelto. 3. Chronic kidney disease. 4. Pulmonary hypertension. 5. Monoclonal gammopathy of undetermined significance. 6. Pruritus.  Treat with hydroxyzine p.r.n. 7. Diabetes.  Continue Lantus with sliding scale NovoLog.  Glucose     this morning is 157. 8. Possible urinary tract infection.  Culture pending.  She has     required an indwelling Foley because of urinary retention.     Paula Compton. Willey Blade, MD     ROF/MEDQ  D:  07/28/2013  T:  07/28/2013  Job:  [id number]

## 2013-07-28 NOTE — Consult Note (Signed)
CARDIOLOGY CONSULT NOTE   Patient ID: Catherine [name] MRN: [id number] DOB/AGE: May 22, 1923 80 y.o.  Admit Date: 07/27/2013 Referring Physician: Asencion Noble  MD Primary Physician: Asencion Noble, MD Consulting Cardiologist: [name] MD Primary Cardiologist: [name] MD Reason for Consultation: Acute on Chronic Diastolic CHF  Clinical Summary Catherine [name] is a 80 y.o.female with known history of multiple medical problems to include diastolic CHF, COPD, CVA, pulmonary hypertension, atrial fib, hypertension, anemia, admitted for acute on chronic diastolic CHF,anasarca, CKD, who was recenlty dishcarged on 2.28/2015 for recurrent CHF.  Wt on discharge 158 lbs.    She presented with a 2 day history of worsening dyspnea and LEE with 8 lb wt gain. She states that the LEE edema has been worsening for about a week, but she is not sure. Breathing status for 2 days, with extreme dyspnea when her O2 tank ran out after lunch yesterday.     She states that she has been eating salty foods at [facility], as this is provided to her in the dining room to include ham, sausage, bacon, etc. She is medically compliant.   On arrival to ER, she was mildly hypotensive, BP 96/75, HR of 72 with O2 sat of 95%.Creatinine 2.55, Glucose of 158, Hg 10.6, Hct 33.7. Pro-BNP of 12, 316. CXR revealed CHF with a mixture of diffuse interstitial and RLL aveolar edema. She was treated with NTG, lasix 40 mg IV.She has diuresed one liter since admission and is starting to feel better. She remains on 80 mg IV BID for now.         Allergies  Allergen Reactions  . Amoxicillin-Pot Clavulanate Diarrhea    Medications Scheduled Medications: . atorvastatin  10 mg Oral QHS  . calcium carbonate  500 mg of elemental calcium Oral BID  . cholecalciferol  2,000 Units Oral Daily  . citalopram  20 mg Oral Daily  . cyanocobalamin  1,000 mcg Intramuscular Q30 days  . diltiazem  240 mg Oral Daily  . ferrous sulfate  325 mg Oral Q  breakfast  . fluocinonide cream  1 application Topical Daily  . furosemide  80 mg Intravenous BID  . insulin aspart  0-5 Units Subcutaneous QHS  . insulin aspart  0-9 Units Subcutaneous TID WC  . insulin glargine  12 Units Subcutaneous QHS  . metoprolol  50 mg Oral BID  . mirtazapine  7.5 mg Oral QHS  . mupirocin ointment  1 application Topical BID  . pantoprazole  40 mg Oral Daily  . potassium chloride SA  40 mEq Oral Daily  . rivaroxaban  15 mg Oral Q breakfast  . sodium chloride  3 mL Intravenous Q12H       PRN Medications: sodium chloride, acetaminophen, albuterol, clonazePAM, hydrOXYzine, ondansetron (ZOFRAN) IV, ondansetron, polyethylene glycol, sodium chloride   Past Medical History  Diagnosis Date  . CHF (congestive heart failure)   . Diabetes mellitus   . Hypertension   . COPD (chronic obstructive pulmonary disease)   . Vertigo   . Cancer     breast with mastectomy  . Stroke      patient denies  . Skin disorder      Epidermolysis bullosa.  . Pulmonary hypertension   . Atrial fibrillation 2011  . Multiple thyroid nodules   . Anemia   . Breast cancer   . Anemia of other chronic disease 06/09/2012  . Aortic stenosis   . Monoclonal gammopathy   . Hypothermia   . Diverticulosis   .  Squamous cell carcinoma   . Osteoporosis, unspecified 02/24/2013  . Chronic kidney disease   . On home O2     2L N/C    Past Surgical History  Procedure Laterality Date  . Cholecystectomy    . Hemorrhoid surgery    . Abdominal hysterectomy  1991    complete hysterectomy for cancer  . Mastectomy  2005    left breast, tamoxifin  . Goiter    . Hip fracture surgery  2010    right hip replacement  . Hip fracture surgery  2006    left hip replacement  . Colonoscopy  ?    remote  . Cataract extraction, bilateral      Family History  Problem Relation Age of Onset  . Colon cancer Neg Hx   . Heart attack Son     age 48s, suspected MI  . Diabetes Mother     Social  History Catherine [name] reports that she has never smoked. She has never used smokeless tobacco. Catherine [name] reports that she does not drink alcohol.  Review of Systems Otherwise reviewed and negative except as outlined.  Physical Examination Blood pressure 103/69, pulse 72, temperature 98.1 F (36.7 C), temperature source Axillary, resp. rate 13, height 5\' 3"  (1.6 m), weight 166 lb 0.1 oz (75.3 kg), SpO2 93.00%.  Intake/Output Summary (Last 24 hours) at 07/28/13 0825 Last data filed at 07/28/13 0600  Gross per 24 hour  Intake    765 ml  Output   1850 ml  Net  -1085 ml    Telemetry: NSR with wide QRS complex  GEN: No acute distress but is sob with talking. HEENT: Conjunctiva and lids normal, oropharynx clear with moist mucosa. Neck: Supple, no elevated JVP or carotid bruits, no thyromegaly. Lungs: Diminished in the right base, no wheezes.  Cardiac: Regular rate and rhythm, 2/6 holo systolic mrumur, bowel sounds present, no guarding or rebound. Extremities:Bullous edema with weeping from the right leg. Skin: Warm and dry. Musculoskeletal: No kyphosis. Neuropsychiatric: Alert and oriented x3, affect grossly appropriate.  Prior Cardiac Testing/Procedures 1. Echocardiogram 10/06/2012 Left ventricle: The cavity size was moderately reduced. There was mild to moderate concentric hypertrophy. Systolic function was normal. The estimated ejection fraction was in the range of 55% to 60%. Wall motion was normal; there were no regional wall motion abnormalities. - Ventricular septum: Septal motion showed dyssynergy. The contour showed diastolic flattening. - Aortic valve: Mildly to moderately calcified annulus. Trileaflet; mildly thickened leaflets. - Mitral valve: Calcified annulus. Mildly thickened, mildly calcified leaflets . Mild regurgitation. - Left atrium: The atrium was mildly to moderately dilated. - Right ventricle: The cavity size was moderately dilated. Wall thickness was normal.  Systolic function was mildly to moderately reduced. - Right atrium: The atrium was moderately dilated. - Atrial septum: No defect or patent foramen ovale was identified. - Tricuspid valve: Mild-moderate regurgitation. - Pulmonary arteries: Systolic pressure was moderately increased. PA peak pressure: 24mm Hg (S). - Pericardium, extracardiac: There was a small right pleural effusion.   Lab Results  Basic Metabolic Panel:  Recent Labs Lab 07/27/13 1503 07/28/13 0530  NA 141 143  K 5.1 4.0  CL 100 106  CO2 28 25  GLUCOSE 158* 157*  BUN 66* 62*  CREATININE 2.55* 2.22*  CALCIUM 9.2 7.9*    Liver Function Tests:  Recent Labs Lab 07/28/13 0530  AST 12  ALT 6  ALKPHOS 67  BILITOT 0.3  PROT 6.0  ALBUMIN 2.7*    CBC:  Recent Labs Lab 07/27/13 1503  WBC 4.2  NEUTROABS 3.2  HGB 10.6*  HCT 33.7*  MCV 105.3*  PLT 163    Cardiac Enzymes:  Recent Labs Lab 07/27/13 1503 07/27/13 1902 07/28/13 0052 07/28/13 0554  TROPONINI <0.30 <0.30 <0.30 <0.30     Radiology: Dg Chest Port 1 View  07/27/2013   CLINICAL DATA:  Shortness of breath, low oxygen saturation  EXAM: PORTABLE CHEST - 1 VIEW  COMPARISON:  DG CHEST 1V PORT dated 06/21/2013; DG ABD ACUTE W/CHEST dated 06/10/2013; DG CHEST 1V PORT dated 12/10/2012  FINDINGS: Mild cardiac enlargement and aortic calcifications stable. Limited inspiratory effect. Significant vascular congestion. Peribronchial cuffing with interstitial prominence. More hazy airspace opacity right lower lobe.  IMPRESSION: Congestive heart failure with a mixture of diffuse interstitial and right lower lobe alveolar edema.   Electronically Signed   By: Skipper Cliche M.D.   On: 07/27/2013 14:49     ECG: Wide QRS rhythm Right bundle Catherine [name] block   Impression and Recommendations  1.Acute on Chronic CHF: Last echo completed in June of 2014. I will repeat this as she is having recurrent admissions for same, to assess for changes in her systolic  function. She had normal function with past echo. She is eating salty foods provided for her at SNF. Recommend low sodium diet at that facility if possible. Continue diureses as she is responding well to this so far.  Creatinine is improved from admission to 2.22.   2. Chest discomfort: She is ruled out for ACS. Remains on NTG. No further complaints of discomfort. Breathing status has improved.  3. Hx of atrial fib: She remains in NSR with wide QRS complex. She is on Xarelto 15 mg daily in the setting of CKD. No evidence of anemia or bleeding currently.   4. Hx of COPD: On chronic O2 support. She states that the O2 ran out yesterday from her portable tank which caused her breathing status to get significantly worse very quickly. Remains on O2 now.   5. Diabetes: She is currently being treated with insulin. Not on ACE due to CKD.  Signed: Phill Myron. Purcell Nails NP [facility] 07/28/2013, 8:25 AM Co-Sign MD  Attending Note  Patient seen and discussed with NP Purcell Nails, I agree with her documentation above. 80 yo female history of chronic diastolic heart failure, DM, HTN, COPD on home oxygen, pulm HTN, and afib with recent admissions for volume overload admitted with 2-3 days of increasing DOE and LE edema.   CXR with vascular congestion, EKG SR with chronic RBBB and probable LPFB which is chronic with old non-spec ST/T changes, pro-BNP 12,316, trop neg x3, Cr 2.5 (has been trending up over the last several months), K 5.1. Last echo 09/2012 showed 55-60%, mild-mod LVH, PASP 60 with right sided dysfunction, mild-mod TR, mild MR.   Overall presentation consistent with decompensated diastolic heart failure, she also based on prior echo has evidence of pulm HTN and right sided dysfunction that is likely contributing, likely related to her chronic lung disease. Agree with IV diuretics, at discharge would change to oral torsemide for more reliable and potent diuretic effect. Agree with repeat echo  given recent multiple exacerbations. For afib she has been on both dilt and metop for rate control, along with xarelto for anticoag. Will continue current doses.    [name] MD

## 2013-07-28 NOTE — Clinical Social Work Note (Signed)
CSW spoke w Lynelle Smoke, Information systems manager at [employer]. States facility does not deep fry any food, chicken is baked, vegetables are steamed or cooked on stove top, no salt is added during cooking and salt is not available on the table, menus are developed by registered [place]. Sausage and bacon are served at breakfast, ham is not. Facility does not control availability of food brought in from outside by family/friends, however they are not aware that patient is getting any extra food.  Patient is on 2 L of O2 at facility, she did become short of breath in bathroom yesterday.  Facility checked her O2 and found levels low, provided portable O2 tank to ensure she had adequate O2.  Checked her O2 sats, found that she was 85 on 2L and sent patient to [facility] ED.   Facility has patient on no concentrated sweets diet, can also put patient on [place] bacon and sausage at MD request.    Edwyna Shell, LCSW Clinical Social Worker [phone])

## 2013-07-28 NOTE — Consult Note (Signed)
Catherine [name] MRN: [id number] DOB/AGE: [id number] 78 y.o. Primary Care Physician:FAGAN,ROY, MD Admit date: 07/27/2013 Chief Complaint:  Chief Complaint  Patient presents with  . Shortness of Breath   HPI: Pt is 80 year old female with PMHx of  CHF who presented to ER wit c/o LE swelling.  HPI dates back to 3-4 days whn pt started having LE dema, it was progressive. Pt presented to ER yesterday and wa admitted for furher care. Pt offers no c/o chest pain NO c/o fever/cough/chills No c/o Change in speech/vision NO c/o freq/ Urgency/dysuria NO c/o Hematuria    Past Medical History  Diagnosis Date  . CHF (congestive heart failure)   . Diabetes mellitus   . Hypertension   . COPD (chronic obstructive pulmonary disease)   . Vertigo   . Cancer     breast with mastectomy  . Stroke      patient denies  . Skin disorder      Epidermolysis bullosa.  . Pulmonary hypertension   . Atrial fibrillation 2011  . Multiple thyroid nodules   . Anemia   . Breast cancer   . Anemia of other chronic disease 06/09/2012  . Aortic stenosis   . Monoclonal gammopathy   . Hypothermia   . Diverticulosis   . Squamous cell carcinoma   . Osteoporosis, unspecified 02/24/2013  . Chronic kidney disease   . On home O2     2L N/C       Family History  Problem Relation Age of Onset  . Colon cancer Neg Hx   . Heart attack Son     age 80s, suspected MI  . Diabetes Mother     Social History:  reports that she has never smoked. She has never used smokeless tobacco. She reports that she does not drink alcohol or use illicit drugs.   Allergies:  Allergies  Allergen Reactions  . Amoxicillin-Pot Clavulanate Diarrhea    Medications Prior to Admission  Medication Sig Dispense Refill  . atorvastatin (LIPITOR) 10 MG tablet Take 10 mg by mouth at bedtime.      . cholecalciferol (VITAMIN D) 1000 UNITS tablet Take 2,000 Units by mouth daily.      . citalopram (CELEXA) 20 MG tablet Take 20 mg by mouth  daily.      . clonazePAM (KLONOPIN) 0.5 MG tablet Take 0.5 mg by mouth 3 (three) times daily as needed for anxiety.      . cyanocobalamin (,VITAMIN B-12,) 1000 MCG/ML injection Inject 1,000 mcg into the muscle every 30 (thirty) days.      Marland Kitchen diltiazem (DILACOR XR) 240 MG 24 hr capsule Take 240 mg by mouth daily.      . ferrous sulfate 325 (65 FE) MG tablet Take 325 mg by mouth daily with breakfast.      . fluocinonide cream (LIDEX) 5.18 % Apply 1 application topically daily. Apply sparingly to itchy areas on trunk & extremeties      . furosemide (LASIX) 80 MG tablet Take 1 tablet (80 mg total) by mouth 2 (two) times daily.  60 tablet  12  . insulin glargine (LANTUS) 100 UNIT/ML injection Inject 12 Units into the skin daily.      . metoprolol (LOPRESSOR) 50 MG tablet Take 50 mg by mouth 2 (two) times daily.      . mirtazapine (REMERON) 15 MG tablet Take 7.5 mg by mouth at bedtime.       . mupirocin ointment (BACTROBAN) 2 % Apply 1 application topically  2 (two) times daily. Apply to open wounds on hips & thighs      . Oyster Shell (OYSTER CALCIUM) 500 MG TABS tablet Take 500 mg of elemental calcium by mouth 2 (two) times daily.      . pantoprazole (PROTONIX) 40 MG tablet Take 40 mg by mouth daily.      . potassium chloride SA (K-DUR,KLOR-CON) 20 MEQ tablet Take 40 mEq by mouth daily.       . Rivaroxaban (XARELTO) 15 MG TABS tablet Take 15 mg by mouth daily with supper.      Marland Kitchen acetaminophen (TYLENOL) 325 MG tablet Take 650 mg by mouth every 6 (six) hours as needed.      Marland Kitchen albuterol (PROVENTIL HFA;VENTOLIN HFA) 108 (90 BASE) MCG/ACT inhaler Inhale 2 puffs into the lungs every 4 (four) hours as needed for wheezing or shortness of breath.       . polyethylene glycol (MIRALAX / GLYCOLAX) packet Take 17 g by mouth daily as needed for mild constipation.           ZOX:WRUEA from the symptoms mentioned above,there are no other symptoms referable to all systems reviewed.  Marland Kitchen atorvastatin  10 mg Oral QHS   . calcium carbonate  500 mg of elemental calcium Oral BID  . cholecalciferol  2,000 Units Oral Daily  . citalopram  20 mg Oral Daily  . cyanocobalamin  1,000 mcg Intramuscular Q30 days  . diltiazem  240 mg Oral Daily  . ferrous sulfate  325 mg Oral Q breakfast  . fluocinonide cream  1 application Topical Daily  . furosemide  80 mg Intravenous BID  . insulin aspart  0-5 Units Subcutaneous QHS  . insulin aspart  0-9 Units Subcutaneous TID WC  . insulin glargine  12 Units Subcutaneous QHS  . metoprolol  50 mg Oral BID  . mirtazapine  7.5 mg Oral QHS  . mupirocin ointment  1 application Topical BID  . pantoprazole  40 mg Oral Daily  . potassium chloride SA  40 mEq Oral Daily  . rivaroxaban  15 mg Oral Q breakfast  . sodium chloride  3 mL Intravenous Q12H       Physical Exam: Vital signs in last 24 hours: Temp:  [97.5 F (36.4 C)-98.1 F (36.7 C)] 98.1 F (36.7 C) (04/01 0754) Pulse Rate:  [65-98] 72 (04/01 0500) Resp:  [8-28] 13 (04/01 0500) BP: (92-118)/(50-89) 103/69 mmHg (04/01 0400) SpO2:  [87 %-100 %] 93 % (04/01 0500) FiO2 (%):  [40 %] 40 % (03/31 1444) Weight:  [166 lb 0.1 oz (75.3 kg)] 166 lb 0.1 oz (75.3 kg) (04/01 0500) Weight change:  Last BM Date: 07/28/13  Intake/Output from previous day: 03/31 0701 - 04/01 0700 In: 765 [I.V.:765] Out: 1850 [Urine:1850] Total I/O In: -  Out: 1 [Stool:1]   Physical Exam: General- pt is awake,alert, oriented to time place and person Resp- Mild REsp distress, Decreased bs at bases. CVS- S1S2 regular in rate and rhythm GIT- BS+, soft, NT, ND, Abdo wall edema + EXT- 2+ LE Edema, NO Cyanosis CNS- CN 2-12 grossly intact. Moving all 4 extremities Psych- normal mood and affect    Lab Results: CBC  Recent Labs  07/27/13 1503  WBC 4.2  HGB 10.6*  HCT 33.7*  PLT 163    BMET  Recent Labs  07/27/13 1503 07/28/13 0530  NA 141 143  K 5.1 4.0  CL 100 106  CO2 28 25  GLUCOSE 158* 157*  BUN 66* 62*  CREATININE  2.55* 2.22*  CALCIUM 9.2 7.9*   Creat Trend 2015  2.55==>2.22           1.8--2.1( last admission)  2014  1.3--2.1 2012  0.8--1.3   MICRO Recent Results (from the past 240 hour(s))  MRSA PCR SCREENING     Status: None   Collection Time    07/27/13  6:30 PM      Result Value Ref Range Status   MRSA by PCR NEGATIVE  NEGATIVE Final   Comment:            The GeneXpert MRSA Assay (FDA     approved for NASAL specimens     only), is one component of a     comprehensive MRSA colonization     surveillance program. It is not     intended to diagnose MRSA     infection nor to guide or     monitor treatment for     MRSA infections.      Lab Results  Component Value Date   CALCIUM 7.9* 07/28/2013   PHOS 3.7 12/16/2009      Impression: 1)Renal  AKI secondary to Cardio renal / ATN                AKI on CKD                AKI now improving               CKD stage 3/4 .               CKD since 2012               CKD secondary to Multiple factors                          DM/Cardiorenal syndrome/ HTN/ age asso decline                Progression of CKD marked with AKI                Proteinura will check.               Hematuria  present.             2)HTN Target Organ damage  CKD CHF CVA  Medication- On Diuretics On Calcium Channel Blockers On Beta blockers    3)Anemia HGb at goal (9--11) Hx of MGUS On ESA as outpt  4)CKD Mineral-Bone Disorder PTH not avail  Secondary Hyperparathyroidism w/u pending  Phosphorus at goal. Calcium at goal.  5)CHF- Admitted with CHF exacerbation  Primary MD following  6)Electrolytes  Normokalemic NOrmonatremic   7)Acid base Co2 at goal  8) Anasarca    Sec to Right Sided HF ON Diuretics   Plan:  Will continue current tx for now. Will check renal u/s Will quantify proteinuria Will initiate CKD-BMd work up. Educated pt about her current GFR      Shaunee Mulkern S 07/28/2013, 9:59 AM

## 2013-07-29 DIAGNOSIS — I279 Pulmonary heart disease, unspecified: Secondary | ICD-10-CM

## 2013-07-29 DIAGNOSIS — I5033 Acute on chronic diastolic (congestive) heart failure: Principal | ICD-10-CM

## 2013-07-29 DIAGNOSIS — I340 Nonrheumatic mitral (valve) insufficiency: Secondary | ICD-10-CM

## 2013-07-29 LAB — PTH, INTACT AND CALCIUM
Calcium, Total (PTH): 8.3 mg/dL — ABNORMAL LOW (ref 8.4–10.5)
PTH: 217.8 pg/mL — ABNORMAL HIGH (ref 14.0–72.0)

## 2013-07-29 LAB — GLUCOSE, CAPILLARY
GLUCOSE-CAPILLARY: 151 mg/dL — AB (ref 70–99)
Glucose-Capillary: 83 mg/dL (ref 70–99)
Glucose-Capillary: 71 mg/dL (ref 70–99)
Glucose-Capillary: 113 mg/dL — ABNORMAL HIGH (ref 70–99)

## 2013-07-29 LAB — BASIC METABOLIC PANEL
BUN: 64 mg/dL — ABNORMAL HIGH (ref 6–23)
CHLORIDE: 106 meq/L (ref 96–112)
CO2: 29 mEq/L (ref 19–32)
CREATININE: 2.2 mg/dL — AB (ref 0.50–1.10)
Calcium: 8.6 mg/dL (ref 8.4–10.5)
GFR calc non Af Amer: 19 mL/min — ABNORMAL LOW (ref >90)
GFR, EST AFRICAN AMERICAN: 22 mL/min — AB (ref >90)
Glucose, Bld: 120 mg/dL — ABNORMAL HIGH (ref 70–99)
POTASSIUM: 4.1 meq/L (ref 3.7–5.3)
Sodium: 144 mEq/L (ref 137–147)

## 2013-07-29 MED ORDER — RIVAROXABAN 15 MG PO TABS
15.0000 mg | ORAL_TABLET | Freq: Every day | ORAL | Status: DC
  Administered 2013-07-29 – 2013-08-01 (×4): 15 mg via ORAL
  Filled 2013-07-29 (×4): qty 1

## 2013-07-29 MED ORDER — FUROSEMIDE 80 MG PO TABS: 80.0000 mg | ORAL_TABLET | Freq: Two times a day (BID) | ORAL | Status: DC

## 2013-07-29 MED ORDER — FUROSEMIDE 10 MG/ML IJ SOLN
80.0000 mg | Freq: Two times a day (BID) | INTRAMUSCULAR | Status: DC
  Administered 2013-07-29 – 2013-07-31 (×4): 80 mg via INTRAVENOUS
  Filled 2013-07-29 (×4): qty 8

## 2013-07-29 NOTE — Progress Notes (Signed)
Consulting cardiologist:Jalee Saine, Roderic Palau MD Primary Cardiologist: [name] MD  Subjective:    Tired but feeling some better. Skin is causing itching.   Objective:   Temp:  [97.6 F (36.4 C)-98.4 F (36.9 C)] 98.4 F (36.9 C) (04/02 0720) Pulse Rate:  [32-162] 89 (04/02 0800) Resp:  [9-23] 14 (04/02 0800) BP: (94-117)/(46-74) 109/69 mmHg (04/02 0800) SpO2:  [79 %-99 %] 94 % (04/02 0800) Weight:  [159 lb 13.3 oz (72.5 kg)-166 lb 3.6 oz (75.4 kg)] 159 lb 13.3 oz (72.5 kg) (04/02 0745) Last BM Date: 07/28/13  Filed Weights   07/28/13 0500 07/29/13 0500 07/29/13 0745  Weight: 166 lb 0.1 oz (75.3 kg) 166 lb 3.6 oz (75.4 kg) 159 lb 13.3 oz (72.5 kg)    Intake/Output Summary (Last 24 hours) at 07/29/13 0828 Last data filed at 07/29/13 0800  Gross per 24 hour  Intake 148.33 ml  Output   2050 ml  Net -1901.67 ml    Telemetry: Atrial fibrillation rates in the 80's.   Exam:  General: No acute distress.  HEENT: Conjunctiva and lids normal, oropharynx clear.  Lungs: Clear to auscultation, nonlabored.Still on O2.   Cardiac: No elevated JVP or bruits. IRRR, 2/6 holosystolic murmur.  Abdomen: Normoactive bowel sounds, nontender, nondistended.  Extremities: Bullous skin thickening, with pitting edema, distal pulses full.  Neuropsychiatric: Alert and oriented x3, affect appropriate.Very HOH.   Lab Results:  Basic Metabolic Panel:  Recent Labs Lab 07/27/13 1503 07/28/13 0530 07/29/13 0435  NA 141 143 144  K 5.1 4.0 4.1  CL 100 106 106  CO2 28 25 29   GLUCOSE 158* 157* 120*  BUN 66* 62* 64*  CREATININE 2.55* 2.22* 2.20*  CALCIUM 9.2 7.9* 8.6    Liver Function Tests:  Recent Labs Lab 07/28/13 0530  AST 12  ALT 6  ALKPHOS 67  BILITOT 0.3  PROT 6.0  ALBUMIN 2.7*    CBC:  Recent Labs Lab 07/27/13 1503  WBC 4.2  HGB 10.6*  HCT 33.7*  MCV 105.3*  PLT 163    Cardiac Enzymes:  Recent Labs Lab 07/27/13 1902 07/28/13 0052  07/28/13 0554  TROPONINI <0.30 <0.30 <0.30    BNP:  Recent Labs  10/22/12 1540 06/21/13 1620 07/27/13 1503  PROBNP 6911.00* 9750.0* 12316.0*    Coagulation:  Recent Labs Lab 07/27/13 1909  INR 2.11*   Echocardiogram 07/28/2013 Left ventricle: The cavity size was normal. Wall thickness was increased in a pattern of mild LVH. Systolic function was normal. The estimated ejection fraction was in the range of 55% to 60%. Wall motion was normal; there were no regional wall motion abnormalities. - Aortic valve: Mildly to moderately calcified annulus. Mildly thickened leaflets. Valve area: 1.55cm^2(VTI). - Mitral valve: Mildly calcified annulus. Moderately thickened leaflets . Moderate regurgitation. The regurgitant jet is eccentric and travels posteriorally along the left atrial wall, this may lead to underestimation of severity. The MR vena contracta is 0.5 cm . - Left atrium: The atrium was severely dilated. - Right ventricle: The interventricular septum is flattened in diastolic suggestive of RV volume overload. The cavity size was severely dilated. Systolic function was mildly to moderately reduced. RV TAPSE is 1.3 cm. - Right atrium: The atrium was severely dilated. - Tricuspid valve: Severe regurgitation. The regurgitation is caused by tricuspid anular dilatation and poor leaflet coaptation. - Pulmonary arteries: Systolic pressure was moderately to severely increased. PA peak pressure: 36mm Hg (S). - Systemic veins: There is systolic hepatic vein flow reversal suggestive of  severe TR. - Inferior vena cava: The vessel was dilated; the respirophasic diameter changes were blunted (< 50%); findings are consistent with elevated central venous pressure. - Pericardium, extracardiac: There is a large left pleural effusion.   Radiology: US Renal  07/28/2013   CLINICAL DATA:  80 year old female with abnormal renal function. Initial encounter.  EXAM: RENAL/URINARY TRACT  ULTRASOUND COMPLETE  COMPARISON:  02/11/2013.  FINDINGS: Right Kidney:  Length: 9.8 cm (unchanged). Chronic increased cortical echogenicity, appears progressed. No hydronephrosis.  Left Kidney:  Not as well visualized today Length: approximately 7.7 cm (previously 9.2 cm). Chronic increased echogenicity. No hydronephrosis.  Bladder:  Diminutive.  Other Findings: Moderate to large volume of ascites, increased since October.  IMPRESSION: 1. No acute renal findings.  Chronic medical renal disease. 2. Progressed ascites, now moderate to severe.   Electronically Signed   By: Lars Pinks M.D.   On: 07/28/2013 17:50   Dg Chest Port 1 View  07/27/2013   CLINICAL DATA:  Shortness of breath, low oxygen saturation  EXAM: PORTABLE CHEST - 1 VIEW  COMPARISON:  DG CHEST 1V PORT dated 06/21/2013; DG ABD ACUTE W/CHEST dated 06/10/2013; DG CHEST 1V PORT dated 12/10/2012  FINDINGS: Mild cardiac enlargement and aortic calcifications stable. Limited inspiratory effect. Significant vascular congestion. Peribronchial cuffing with interstitial prominence. More hazy airspace opacity right lower lobe.  IMPRESSION: Congestive heart failure with a mixture of diffuse interstitial and right lower lobe alveolar edema.   Electronically Signed   By: Skipper Cliche M.D.   On: 07/27/2013 14:49      Medications:   Scheduled Medications: . atorvastatin  10 mg Oral QHS  . calcium carbonate  500 mg of elemental calcium Oral BID  . cholecalciferol  2,000 Units Oral Daily  . citalopram  20 mg Oral Daily  . cyanocobalamin  1,000 mcg Intramuscular Q30 days  . diltiazem  240 mg Oral Daily  . ferrous sulfate  325 mg Oral Q breakfast  . fluocinonide cream  1 application Topical Daily  . furosemide  80 mg Intravenous BID  . insulin aspart  0-5 Units Subcutaneous QHS  . insulin aspart  0-9 Units Subcutaneous TID WC  . insulin glargine  12 Units Subcutaneous QHS  . metoprolol  50 mg Oral BID  . mirtazapine  7.5 mg Oral QHS  . mupirocin  ointment  1 application Topical BID  . pantoprazole  40 mg Oral Daily  . potassium chloride SA  40 mEq Oral Daily  . rivaroxaban  15 mg Oral Q breakfast  . sodium chloride  3 mL Intravenous Q12H    PRN Medications: sodium chloride, acetaminophen, albuterol, clonazePAM, hydrOXYzine, ondansetron (ZOFRAN) IV, ondansetron, polyethylene glycol, sodium chloride   Assessment and Plan:   1. Acute on Chronic  Diastolic CHF: She has diuresed well, and is breathing some better. Still with some fluid overload, although hard to evaluate with chronic bullous skin disease. Wt is down 7 lbs from admission wt. Dry wt 158 lbs,she is 159 lbs. Will back continue IV lasix and likely change to PO tomorrow. She will need to have L[facility] SODIUM DIET at [facility].  This is contributing to her recurrent exacerbations.  Echo demonstrated normal EF. MR is prominent with jet eccentric and travels posteriorally along the left atrial wall. Will defer to Dr.Buffi Ewton on need to plan TEE for better visualization.   2.  Hx of Atrial fib: Heart rate is well controlled currently in NSR. She will continue on BB and dilt along with Xarelto.  3. Hx COPD:  On chronic O2. Breathing status is some better with diureses.     Phill Myron. Purcell Nails NP [facility] 07/29/2013, 8:28 AM  Attending Note  Patient seen and discussed with NP Purcell Nails, I agree with her documentation above. She is net negative 3 liters by charting, Cr and BUN remain stable. Weight has decreased nearly back down to baseline weight. Her echo shows normal LV function, moderate eccentric MR that likely is underestimated based on the eccentricity of the jet, severe RV dilatation with moderate dysfunction and severe TR. Could not assess diastolic function due to afib. Would not consider TEE at this time to further evaluate MR due to her age, multiple medical comorbidities including COPD on O2, CKD, MGUS,  and right sided heart failure she is very high risk for any form of  potential intervention. At this time continue to optimize medical therapy to manage symptoms. Would continue IV lasix, still with evidence of volume overload on exam, potentially change to oral tomorrow.   [name] MD

## 2013-07-29 NOTE — Clinical Social Work Note (Signed)
CSW visited w patient, made her aware that CSW is coordinating arrangements for return to [facility] when medically ready.  Updated [employer].  Edwyna Shell, LCSW Clinical Social Worker [phone])

## 2013-07-29 NOTE — Progress Notes (Signed)
Subjective: Interval History: has complaints difficulty in breathing. Patient is still that she is feeling much better as compared to yesterday. She was able to sleep a few hours. She denies any cough. Patient denies any nausea vomiting. And she states that her appetite is reasonable.  Objective: Vital signs in last 24 hours: Temp:  [97.6 F (36.4 C)-98.4 F (36.9 C)] 98.4 F (36.9 C) (04/02 0720) Pulse Rate:  [32-162] 89 (04/02 0800) Resp:  [9-23] 14 (04/02 0800) BP: (94-117)/(46-74) 109/69 mmHg (04/02 0800) SpO2:  [80 %-99 %] 94 % (04/02 0800) Weight:  [72.5 kg (159 lb 13.3 oz)-75.4 kg (166 lb 3.6 oz)] 72.5 kg (159 lb 13.3 oz) (04/02 0745) Weight change: 0.1 kg (3.5 oz)  Intake/Output from previous day: 04/01 0701 - 04/02 0700 In: 178.3 [I.V.:178.3] Out: 2051 [Urine:2050; Stool:1] Intake/Output this shift: Total I/O In: 70 [I.V.:70] Out: -   General appearance: alert, cooperative and no distress Resp: diminished breath sounds bilaterally Cardio: systolic murmur: holosystolic 3/6, medium pitch at 2nd left intercostal space GI: She is some fullness. Nontender and positive bowel sound Extremities: edema 2+ edema bilaterally.  Lab Results:  Recent Labs  07/27/13 1503  WBC 4.2  HGB 10.6*  HCT 33.7*  PLT 163   BMET:  Recent Labs  07/28/13 0530 07/29/13 0435  NA 143 144  K 4.0 4.1  CL 106 106  CO2 25 29  GLUCOSE 157* 120*  BUN 62* 64*  CREATININE 2.22* 2.20*  CALCIUM 7.9* 8.6   No results found for this basename: PTH,  in the last 72 hours Iron Studies: No results found for this basename: IRON, TIBC, TRANSFERRIN, FERRITIN,  in the last 72 hours  Studies/Results: US Renal  07/28/2013   CLINICAL DATA:  80 year old female with abnormal renal function. Initial encounter.  EXAM: RENAL/URINARY TRACT ULTRASOUND COMPLETE  COMPARISON:  02/11/2013.  FINDINGS: Right Kidney:  Length: 9.8 cm (unchanged). Chronic increased cortical echogenicity, appears progressed. No  hydronephrosis.  Left Kidney:  Not as well visualized today Length: approximately 7.7 cm (previously 9.2 cm). Chronic increased echogenicity. No hydronephrosis.  Bladder:  Diminutive.  Other Findings: Moderate to large volume of ascites, increased since October.  IMPRESSION: 1. No acute renal findings.  Chronic medical renal disease. 2. Progressed ascites, now moderate to severe.   Electronically Signed   By: Lars Pinks M.D.   On: 07/28/2013 17:50   Dg Chest Port 1 View  07/27/2013   CLINICAL DATA:  Shortness of breath, low oxygen saturation  EXAM: PORTABLE CHEST - 1 VIEW  COMPARISON:  DG CHEST 1V PORT dated 06/21/2013; DG ABD ACUTE W/CHEST dated 06/10/2013; DG CHEST 1V PORT dated 12/10/2012  FINDINGS: Mild cardiac enlargement and aortic calcifications stable. Limited inspiratory effect. Significant vascular congestion. Peribronchial cuffing with interstitial prominence. More hazy airspace opacity right lower lobe.  IMPRESSION: Congestive heart failure with a mixture of diffuse interstitial and right lower lobe alveolar edema.   Electronically Signed   By: Skipper Cliche M.D.   On: 07/27/2013 14:49    I have reviewed the patient's current medications.  Assessment/Plan: Problem #1 renal failure chronic: Possibly late stage IIIB. Etiology was thought to be possibly secondary to hypertension/diabetes/cardiorenal. Since patient has unequal kidney at this moment ischemic nephropathy and focal segmental nephrosclerosis cannot ruled out. Problem #2 CHF/anasarca: Patient urine output is good and patient seems to be improving. Problem #3 history of pulmonary hypertension moderate to severe Problem #4 anemia: Most likely secondary to chronic renal failure Problem #5 history of  a trial fibrillation Problem #6 hypertension: Her systolic blood pressure to normal. Problem #7 history of diabetes  Problem #8 metabolic bone disease: Her calcium and phosphorus in the range. Problem #9 history of monoclonal gammopathy of  unknown significance. Patient has high Kappa to lambda ratio . Plan: Continue his present management. We'll check her compressive metabolic panel in the morning.    LOS: 2 days   Mehek Grega S 07/29/2013,9:15 AM

## 2013-07-29 NOTE — Progress Notes (Signed)
NAME:  Catherine [name], Catherine [name]                   ACCOUNT NO.:  [id number]  MEDICAL RECORD NO.:  [id number]  LOCATION:                                 FACILITY:  PHYSICIAN:  Paula Compton. Willey Blade, MD       DATE OF BIRTH:  Nov 05, 1923  DATE OF PROCEDURE:  07/29/2013 DATE OF DISCHARGE:                                PROGRESS NOTE   Catherine [name] states that she feels better.  She has diuresed 1850 mL.  Her weight is 75.3 kg (166 pounds).  VITAL SIGNS:  Temperature 98.4, pulse 100, respirations 14, oxygen saturation 93% on 4.5 L by nasal cannula. LUNGS:  Basilar rales. HEART:  Irregular with a grade 2 systolic murmur. ABDOMEN:  Mildly distended, nontender. EXTREMITIES:  Improved edema noted.  IMPRESSION/PLAN: 1. Congestive heart failure.  Continue IV Lasix.  [facility]     Cardiology consults.  Repeat echo reveals a normal ejection     fraction at 55-60%.  There were no wall motion abnormalities.  The     aortic valve was mildly to moderately calcified.  The mitral valve     revealed moderate mitral regurgitation.  Left atrium was severely     dilated.  Pulmonary artery systolic pressure was moderately to     severely increased with PA peak pressure 65. 2. Chronic kidney disease.  BUN and creatinine are stable at 64 and     2.20.  Appreciate Nephrology consult.  Renal ultrasound reveals     medical renal disease with no hydronephrosis.  Potassium is stable     at 4.1, phosphorus is normal at 3.8. 3. Atrial fibrillation.  Continue Xarelto. 4. Pulmonary hypertension. 5. Diabetes.  Fasting glucose is 83.  The patient will be moved from the ICU to a 3A bed with telemetry today.     Paula Compton. Willey Blade, MD     ROF/MEDQ  D:  07/29/2013  T:  07/29/2013  Job:  [id number]

## 2013-07-30 DIAGNOSIS — D62 Acute posthemorrhagic anemia: Secondary | ICD-10-CM

## 2013-07-30 LAB — BASIC METABOLIC PANEL
BUN: 67 mg/dL — ABNORMAL HIGH (ref 6–23)
CO2: 28 meq/L (ref 19–32)
CREATININE: 2.11 mg/dL — AB (ref 0.50–1.10)
Calcium: 9.2 mg/dL (ref 8.4–10.5)
Chloride: 102 mEq/L (ref 96–112)
GFR calc Af Amer: 23 mL/min — ABNORMAL LOW (ref >90)
GFR calc non Af Amer: 20 mL/min — ABNORMAL LOW (ref >90)
Glucose, Bld: 96 mg/dL (ref 70–99)
POTASSIUM: 4.2 meq/L (ref 3.7–5.3)
Sodium: 141 mEq/L (ref 137–147)

## 2013-07-30 LAB — GLUCOSE, CAPILLARY
GLUCOSE-CAPILLARY: 73 mg/dL (ref 70–99)
GLUCOSE-CAPILLARY: 138 mg/dL — AB (ref 70–99)
GLUCOSE-CAPILLARY: 174 mg/dL — AB (ref 70–99)
Glucose-Capillary: 102 mg/dL — ABNORMAL HIGH (ref 70–99)

## 2013-07-30 LAB — IMMUNOFIXATION, URINE

## 2013-07-30 MED ORDER — CALCITRIOL 0.25 MCG PO CAPS
0.2500 ug | ORAL_CAPSULE | Freq: Every day | ORAL | Status: DC
  Administered 2013-07-30 – 2013-08-02 (×4): 0.25 ug via ORAL
  Filled 2013-07-30 (×4): qty 1

## 2013-07-30 NOTE — Progress Notes (Signed)
Pt changed over to 55% v.mask due to low spo2 . Nurse had increase pt o2 to 6lpm cann spo2 85% treatment given

## 2013-07-30 NOTE — Clinical Documentation Improvement (Signed)
  Patient with chronic CHF and COPD on Home O2 2L dependent. Please clarify O2 dependent status in Notes and DC summary to reflect severity of illness and risk of mortality. Thank you.  Possible Clinical Conditions? - Chronic Respiratory Failure - Other Condition  Thank You, Ezekiel Ina ,RN Clinical Documentation Specialist:  [phone]  [facility]

## 2013-07-30 NOTE — Plan of Care (Signed)
Problem: Phase I Progression Outcomes Goal: Voiding-avoid urinary catheter unless indicated Outcome: Not Met (add Reason) Patient has chronic foley catheter     

## 2013-07-30 NOTE — Progress Notes (Signed)
Consulting cardiologist: Dorris Carnes MD Primary Cardiologist: [name] MD  Subjective:    Feels about the same. She is breathing some better. Good night's sleep.   Objective:   Temp:  [97.7 F (36.5 C)-98.3 F (36.8 C)] 97.7 F (36.5 C) (04/03 0400) Pulse Rate:  [26-162] 26 (04/03 0800) Resp:  [12-24] 14 (04/03 0800) BP: (96-144)/(20-122) 112/65 mmHg (04/03 0800) SpO2:  [78 %-100 %] 80 % (04/03 0800) FiO2 (%):  [55 %] 55 % (04/03 0500) Weight:  [170 lb 6.7 oz (77.3 kg)] 170 lb 6.7 oz (77.3 kg) (04/03 0500) Last BM Date: 07/30/13  Filed Weights   07/29/13 0500 07/29/13 0745 07/30/13 0500  Weight: 166 lb 3.6 oz (75.4 kg) 159 lb 13.3 oz (72.5 kg) 170 lb 6.7 oz (77.3 kg)    Intake/Output Summary (Last 24 hours) at 07/30/13 0826 Last data filed at 07/30/13 0800  Gross per 24 hour  Intake   1150 ml  Output   2351 ml  Net  -1201 ml    Telemetry: Atrial fib   Exam:  General: No acute distress.  HEENT: Conjunctiva and lids normal, oropharynx clear.  Lungs: Clear to auscultation, nonlabored. Diminished in the bases. Upper airway wheeze  Cardiac: JVP is elevated.  RRR  No murmurs  or bruits. RRR, no gallop or rub.   Abdomen: Normoactive bowel sounds, nontender, nondistended.  Extremities: 1+ pitting edema, LE  2+ UE edema  Neuropsychiatric: Alert and oriented x3, affect appropriate.   Lab Results:  Basic Metabolic Panel:  Recent Labs Lab 07/28/13 0530 07/28/13 1052 07/29/13 0435 07/30/13 0427  NA 143  --  144 141  K 4.0  --  4.1 4.2  CL 106  --  106 102  CO2 25  --  29 28  GLUCOSE 157*  --  120* 96  BUN 62*  --  64* 67*  CREATININE 2.22*  --  2.20* 2.11*  CALCIUM 7.9* 8.3* 8.6 9.2    Liver Function Tests:  Recent Labs Lab 07/28/13 0530  AST 12  ALT 6  ALKPHOS 67  BILITOT 0.3  PROT 6.0  ALBUMIN 2.7*    CBC:  Recent Labs Lab 07/27/13 1503  WBC 4.2  HGB 10.6*  HCT 33.7*  MCV 105.3*  PLT 163    Cardiac  Enzymes:  Recent Labs Lab 07/27/13 1902 07/28/13 0052 07/28/13 0554  TROPONINI <0.30 <0.30 <0.30    BNP:  Recent Labs  10/22/12 1540 06/21/13 1620 07/27/13 1503  PROBNP 6911.00* 9750.0* 12316.0*    Coagulation:  Recent Labs Lab 07/27/13 1909  INR 2.11*    Radiology: US Renal  07/28/2013   CLINICAL DATA:  80 year old female with abnormal renal function. Initial encounter.  EXAM: RENAL/URINARY TRACT ULTRASOUND COMPLETE  COMPARISON:  02/11/2013.  FINDINGS: Right Kidney:  Length: 9.8 cm (unchanged). Chronic increased cortical echogenicity, appears progressed. No hydronephrosis.  Left Kidney:  Not as well visualized today Length: approximately 7.7 cm (previously 9.2 cm). Chronic increased echogenicity. No hydronephrosis.  Bladder:  Diminutive.  Other Findings: Moderate to large volume of ascites, increased since October.  IMPRESSION: 1. No acute renal findings.  Chronic medical renal disease. 2. Progressed ascites, now moderate to severe.   Electronically Signed   By: Lars Pinks M.D.   On: 07/28/2013 17:50     Medications:   Scheduled Medications: . atorvastatin  10 mg Oral QHS  . calcitRIOL  0.25 mcg Oral Daily  . calcium carbonate  500 mg of elemental calcium Oral  BID  . cholecalciferol  2,000 Units Oral Daily  . citalopram  20 mg Oral Daily  . cyanocobalamin  1,000 mcg Intramuscular Q30 days  . diltiazem  240 mg Oral Daily  . ferrous sulfate  325 mg Oral Q breakfast  . fluocinonide cream  1 application Topical Daily  . furosemide  80 mg Intravenous Q12H  . insulin aspart  0-5 Units Subcutaneous QHS  . insulin aspart  0-9 Units Subcutaneous TID WC  . insulin glargine  12 Units Subcutaneous QHS  . metoprolol  50 mg Oral BID  . mirtazapine  7.5 mg Oral QHS  . mupirocin ointment  1 application Topical BID  . pantoprazole  40 mg Oral Daily  . potassium chloride SA  40 mEq Oral Daily  . rivaroxaban  15 mg Oral Q supper  . sodium chloride  3 mL Intravenous Q12H        PRN Medications: sodium chloride, acetaminophen, albuterol, clonazePAM, hydrOXYzine, ondansetron (ZOFRAN) IV, ondansetron, polyethylene glycol, sodium chloride   Assessment and Plan:   1.Acute on Chronic Diastolic CHF: She continues significant diureses, but wts are not accurate-(weighing patient with multiple pillows and with f/c bag on the bed, with other equipment. I have discussed this with the nurses). She is breathing better, but continues overall anasarca. Dry wt is approx 158 lbs. Normal LV fx per echo this admission.  Will continue IV lasix for now. Not yet ready for transition to PO.  2. Hx of Atrial fib: Heart rate is controlled currently. She is continued on BB, diltiazem and xarelto. Some hematuria noted yesterday, but urine is clear this am.  3. Chronic Renal Failure: Followed by Dr. Lowanda Foster. Creatinine 2.11.   Phill Myron. Purcell Nails NP [facility] 07/30/2013, 8:26 AM  Patinet seen and examined.  Agree with findings as noted by Arnold Long above  I have amended note    Continue diuresis  Edema improving.    Dorris Carnes

## 2013-07-30 NOTE — Progress Notes (Signed)
Patient became wheezy and labored breathing during bath, RT called for treatment prn.  Continued with bath, patient tolerated well.  Notified on call MD for order to change out foley catheter.  Present catheter with much sediment, and hematuria.  Hygiene kept and catheter placed without difficulty.  #20 FR per previous catheter size. Patient requested a nerve medication and continued scratching, bacitracin and dressings reapplied and medications  given per orders. Patient resting at present and remains in Afib but HR is dipping to the high 40's and low 50's, monitoring closely as beta blocker was given at HS. Strip saved and on chart. Am monitoring closely.  Patient is resting  soundly.

## 2013-07-30 NOTE — Clinical Social Work Note (Signed)
Update given to Butch Penny at [facility], facility continues to be willing to accept patient at discharge.  Edwyna Shell, LCSW Clinical Social Worker [phone])

## 2013-07-30 NOTE — Progress Notes (Signed)
NAME:  ALILAH, MCMEANS                   ACCOUNT NO.:  [id number]  MEDICAL RECORD NO.:  [id number]  LOCATION:  IC04                          FACILITY:  [facility]  PHYSICIAN:  Paula Compton. Willey Blade, MD       DATE OF BIRTH:  [birth year]  DATE OF PROCEDURE:  07/30/2013 DATE OF DISCHARGE:                                PROGRESS NOTE   SUBJECTIVE:  Ms. Catherine [name] is feeling better this morning.  She slept well last night.  She feels that her arms and legs are improving.  Her breathing is better.  Her oxygen saturation now is in the mid 90s.  She diuresed 2350 mL yesterday.  Her weight is recorded at 160.  OBJECTIVE:  VITAL SIGNS:  Temperature 97.7, pulse 75, respirations 14, blood pressure 112/65. LUNGS:  Mild basilar rales. HEART:  Irregularly irregular with a grade 2 systolic murmur. ABDOMEN:  Mildly distended. EXTREMITIES:  Improving edema.  IMPRESSION/PLAN: 1. Congestive heart failure (acute on chronic diastolic congestive     heart failure) improving, continue IV Lasix. 2. Chronic kidney disease.  BUN and creatinine are 67 and 2.11 today.     Potassium is 4.2. 3. Acute on chronic respiratory failure, improving. 4. Pulmonary hypertension. 5. Diabetes, stable.  Fasting glucose was 73.  Continue Lantus with     sliding scale NovoLog as needed.  Peak glucose yesterday was 151. 6. MGUS. 7. Atrial fibrillation.  Continue Xarelto.     Paula Compton. Willey Blade, MD     ROF/MEDQ  D:  07/30/2013  T:  07/30/2013  Job:  [id number]

## 2013-07-30 NOTE — Progress Notes (Signed)
Subjective: Interval History: Her breathing is better but still she has some wheezing. Presently denies any cough or chest pain  Objective: Vital signs in last 24 hours: Temp:  [97.7 F (36.5 C)-98.3 F (36.8 C)] 97.7 F (36.5 C) (04/03 0400) Pulse Rate:  [49-162] 50 (04/03 0600) Resp:  [12-24] 16 (04/03 0600) BP: (96-144)/(20-122) 105/50 mmHg (04/03 0600) SpO2:  [78 %-100 %] 96 % (04/03 0600) FiO2 (%):  [55 %] 55 % (04/03 0500) Weight:  [77.3 kg (170 lb 6.7 oz)] 77.3 kg (170 lb 6.7 oz) (04/03 0500) Weight change: -2.9 kg (-6 lb 6.3 oz)  Intake/Output from previous day: 04/02 0701 - 04/03 0700 In: 1200 [P.O.:910; I.V.:290] Out: 2351 [Urine:2350; Stool:1] Intake/Output this shift:   generally she is alert and in no apparent distress. Chest decreased breath sound bilaterally. She has some expiratory wheezing. Heart exam revealed regular rate and rhythm and no S3 Abdomen soft nontender positive bowel sound Extremities: Patient is still has 1-2+ edema.    Lab Results:  Recent Labs  07/27/13 1503  WBC 4.2  HGB 10.6*  HCT 33.7*  PLT 163   BMET:   Recent Labs  07/29/13 0435 07/30/13 0427  NA 144 141  K 4.1 4.2  CL 106 102  CO2 29 28  GLUCOSE 120* 96  BUN 64* 67*  CREATININE 2.20* 2.11*  CALCIUM 8.6 9.2    Recent Labs  07/28/13 1052  PTH 217.8*   Iron Studies: No results found for this basename: IRON, TIBC, TRANSFERRIN, FERRITIN,  in the last 72 hours  Studies/Results: US Renal  07/28/2013   CLINICAL DATA:  80 year old female with abnormal renal function. Initial encounter.  EXAM: RENAL/URINARY TRACT ULTRASOUND COMPLETE  COMPARISON:  02/11/2013.  FINDINGS: Right Kidney:  Length: 9.8 cm (unchanged). Chronic increased cortical echogenicity, appears progressed. No hydronephrosis.  Left Kidney:  Not as well visualized today Length: approximately 7.7 cm (previously 9.2 cm). Chronic increased echogenicity. No hydronephrosis.  Bladder:  Diminutive.  Other Findings:  Moderate to large volume of ascites, increased since October.  IMPRESSION: 1. No acute renal findings.  Chronic medical renal disease. 2. Progressed ascites, now moderate to severe.   Electronically Signed   By: Lars Pinks M.D.   On: 07/28/2013 17:50    I have reviewed the patient's current medications.  Assessment/Plan: Problem #1 renal failure chronic: Possibly late stage IIIB. Etiology was thought to be possibly secondary to hypertension/diabetes/cardiorenal. Since patient has unequal kidney at this moment ischemic nephropathy and focal segmental nephrosclerosis cannot ruled out. Her renal function is improving slowly. Problem #2 CHF/anasarca: Patient urine output is good and patient seems to be improving. Patient has about 2300 cc of output. Problem #3 history of pulmonary hypertension moderate to severe Problem #4 anemia: Most likely secondary to chronic renal failure Problem #5 history of a trial fibrillation Problem #6 hypertension: Her systolic blood pressure is reasonably controlled Problem #7 history of diabetes  Problem #8 metabolic bone disease: Her calcium and phosphorus in the range. Her PTH is high. Problem #9 history of monoclonal gammopathy of unknown significance. Patient has high Kappa to lambda ratio . Plan: We'll start patient on Rocaltrol 0.25 mcg by mouth once a day. We'll check her vitamin D level in the morning. We'll check her compressive metabolic panel in the morning.    LOS: 3 days   Catherine [name] S 07/30/2013,7:59 AM

## 2013-07-30 NOTE — Plan of Care (Signed)
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, difficult to heal due to DM  Problem: Phase I Progression Outcomes Goal: Dyspnea controlled at rest (HF) Outcome: Progressing Patient remains on 4 liters of oxygen tonight, Wheezing and labored at times, breathing treatment needed Goal: Pain controlled with appropriate interventions Outcome: Not Applicable Date Met:  35/45/62 No complaints of pain Goal: EF % per last Echo/documented,Core Reminder form on chart Outcome: Progressing Last Echo this visit showed 55-60% EF Goal: Initial discharge plan identified Outcome: Progressing Return to [employer] [employer] Goal: Hemodynamically stable Outcome: Progressing Remains in chronic Afib on Xarelto, rate controlled

## 2013-07-30 NOTE — Progress Notes (Signed)
Called report to Charlynn Court, RN.  Verbalized understanding.  Pt transferred to rm [id number] in safe and stable condition  via wheelchair by RN.  Schonewitz, Eulis Canner 07/30/2013

## 2013-07-31 LAB — COMPREHENSIVE METABOLIC PANEL
ALK PHOS: 72 U/L (ref 39–117)
ALT: 10 U/L (ref 0–35)
AST: 21 U/L (ref 0–37)
Albumin: 2.8 g/dL — ABNORMAL LOW (ref 3.5–5.2)
BUN: 72 mg/dL — AB (ref 6–23)
CALCIUM: 9 mg/dL (ref 8.4–10.5)
CO2: 28 mEq/L (ref 19–32)
CREATININE: 2.39 mg/dL — AB (ref 0.50–1.10)
Chloride: 103 mEq/L (ref 96–112)
GFR calc non Af Amer: 17 mL/min — ABNORMAL LOW (ref >90)
GFR, EST AFRICAN AMERICAN: 20 mL/min — AB (ref >90)
Glucose, Bld: 143 mg/dL — ABNORMAL HIGH (ref 70–99)
POTASSIUM: 4.7 meq/L (ref 3.7–5.3)
Sodium: 142 mEq/L (ref 137–147)
TOTAL PROTEIN: 6.3 g/dL (ref 6.0–8.3)
Total Bilirubin: 0.4 mg/dL (ref 0.3–1.2)

## 2013-07-31 LAB — GLUCOSE, CAPILLARY
GLUCOSE-CAPILLARY: 112 mg/dL — AB (ref 70–99)
Glucose-Capillary: 127 mg/dL — ABNORMAL HIGH (ref 70–99)
Glucose-Capillary: 125 mg/dL — ABNORMAL HIGH (ref 70–99)
Glucose-Capillary: 133 mg/dL — ABNORMAL HIGH (ref 70–99)

## 2013-07-31 MED ORDER — TORSEMIDE 20 MG PO TABS
20.0000 mg | ORAL_TABLET | Freq: Every day | ORAL | Status: DC
  Administered 2013-08-01 – 2013-08-02 (×2): 20 mg via ORAL
  Filled 2013-07-31 (×2): qty 1

## 2013-07-31 MED ORDER — DIPHENHYDRAMINE HCL 12.5 MG/5ML PO ELIX
12.5000 mg | ORAL_SOLUTION | ORAL | Status: DC | PRN
  Administered 2013-07-31 – 2013-08-01 (×2): 12.5 mg via ORAL
  Filled 2013-07-31 (×2): qty 5

## 2013-07-31 NOTE — Progress Notes (Signed)
Subjective: Interval History: Her breathing is better and new complaint. Presently denies any cough or difficulty in breathing  Objective: Vital signs in last 24 hours: Temp:  [97.3 F (36.3 C)-98 F (36.7 C)] 97.3 F (36.3 C) (04/04 0500) Pulse Rate:  [32-140] 88 (04/04 0500) Resp:  [12-27] 12 (04/04 0500) BP: (105-145)/(50-74) 110/55 mmHg (04/04 0855) SpO2:  [80 %-95 %] 95 % (04/04 0500) Weight:  [77.474 kg (170 lb 12.8 oz)] 77.474 kg (170 lb 12.8 oz) (04/04 0500) Weight change: 0.1 kg (3.5 oz)  Intake/Output from previous day: 04/03 0701 - 04/04 0700 In: 70 [I.V.:70] Out: 975 [Urine:975] Intake/Output this shift:   generally she is alert and in no apparent distress. Chest decreased breath sound bilaterally. She has some expiratory wheezing. Heart exam revealed regular rate and rhythm and no S3 Abdomen soft nontender positive bowel sound Extremities: Patient is still has trace to 1+  edema.    Lab Results: No results found for this basename: WBC, HGB, HCT, PLT,  in the last 72 hours BMET:   Recent Labs  07/30/13 0427 07/31/13 0623  NA 141 142  K 4.2 4.7  CL 102 103  CO2 28 28  GLUCOSE 96 143*  BUN 67* 72*  CREATININE 2.11* 2.39*  CALCIUM 9.2 9.0    Recent Labs  07/28/13 1052  PTH 217.8*   Iron Studies: No results found for this basename: IRON, TIBC, TRANSFERRIN, FERRITIN,  in the last 72 hours  Studies/Results: No results found.  I have reviewed the patient's current medications.  Assessment/Plan: Problem #1 renal failure chronic: Possibly late stage IIIB. Etiology was thought to be possibly secondary to hypertension/diabetes/cardiorenal. Since patient has unequal kidney at this moment ischemic nephropathy and focal segmental nephrosclerosis cannot ruled out. Her renal function is slowly worsening possibly from diuretics. Problem #2 CHF/anasarca: Is improving and presently she is on lasix Problem #3 history of pulmonary hypertension moderate to  severe Problem #4 anemia: Most likely secondary to chronic renal failure Problem #5 history of a trial fibrillation Problem #6 hypertension: Her systolic blood pressure is reasonably controlled Problem #7 history of diabetes  Problem #8 metabolic bone disease: Her calcium and phosphorus in the range. Her PTH is high. Problem #9 history of monoclonal gammopathy of unknown significance. Patient has high Kappa to lambda ratio . Plan: We'll D/C lasix iv Start on Demadex 20 mg po once a day    LOS: 4 days   Chanta Bauers S 07/31/2013,8:57 AM

## 2013-07-31 NOTE — Progress Notes (Signed)
NAME:  Catherine [name], Catherine [name]                   ACCOUNT NO.:  [id number]  MEDICAL RECORD NO.:  [id number]  LOCATION:  K270                          FACILITY:  [facility]  PHYSICIAN:  Paula Compton. Willey Blade, MD       DATE OF BIRTH:  1923-11-24  DATE OF PROCEDURE:  07/31/2013 DATE OF DISCHARGE:                                PROGRESS NOTE   SUBJECTIVE:  Ms. Spaeth states that she feels better.  She is breathing more comfortably.  Urine diuresis is recorded at 975 mL.  She has had some blood in her Foley bag.  She feels that her arm swelling and leg swelling are better.  Her weight is recorded at 170 pounds.  OBJECTIVE:  VITAL SIGNS:  Temperature 97.3, pulse 88, blood pressure 105/50. LUNGS:  Basilar rales. HEART:  Irregular with a grade 3 systolic murmur. ABDOMEN:  Mildly distended. EXTREMITIES:  Improved edema.  IMPRESSION/PLAN: 1. Congestive heart failure (chronic diastolic heart failure).     Continue IV Lasix.  She is slowly improving. 2. Chronic kidney disease.  BUN and creatinine are increased at 72 and     2.39 with a potassium of 4.7.  She has been started on Rocaltrol by     Nephrology. 3. Atrial fibrillation.  Continue Xarelto.  Continue diltiazem and     metoprolol. 4. Pulmonary hypertension. 5. Monoclonal gammopathy of undetermined significance. 6. Diabetes.  Glucose this morning is 143.     Paula Compton. Willey Blade, MD     ROF/MEDQ  D:  07/31/2013  T:  07/31/2013  Job:  [id number]

## 2013-08-01 LAB — BASIC METABOLIC PANEL
BUN: 76 mg/dL — ABNORMAL HIGH (ref 6–23)
CO2: 30 meq/L (ref 19–32)
Calcium: 9.2 mg/dL (ref 8.4–10.5)
Chloride: 102 mEq/L (ref 96–112)
Creatinine, Ser: 2.53 mg/dL — ABNORMAL HIGH (ref 0.50–1.10)
GFR calc Af Amer: 19 mL/min — ABNORMAL LOW (ref >90)
GFR calc non Af Amer: 16 mL/min — ABNORMAL LOW (ref >90)
GLUCOSE: 67 mg/dL — AB (ref 70–99)
POTASSIUM: 4.3 meq/L (ref 3.7–5.3)
SODIUM: 141 meq/L (ref 137–147)

## 2013-08-01 LAB — GLUCOSE, CAPILLARY
GLUCOSE-CAPILLARY: 65 mg/dL — AB (ref 70–99)
Glucose-Capillary: 92 mg/dL (ref 70–99)
Glucose-Capillary: 134 mg/dL — ABNORMAL HIGH (ref 70–99)
Glucose-Capillary: 160 mg/dL — ABNORMAL HIGH (ref 70–99)

## 2013-08-01 MED ORDER — BISACODYL 5 MG PO TBEC
10.0000 mg | DELAYED_RELEASE_TABLET | Freq: Once | ORAL | Status: AC
  Administered 2013-08-01: 10 mg via ORAL
  Filled 2013-08-01: qty 2

## 2013-08-01 NOTE — Progress Notes (Signed)
Subjective: Interval History: no new complaint. Feeling better  Objective: Vital signs in last 24 hours: Temp:  [97.3 F (36.3 C)-97.8 F (36.6 C)] 97.3 F (36.3 C) (04/05 0447) Pulse Rate:  [89-93] 89 (04/05 0447) Resp:  [11-20] 11 (04/05 0447) BP: (95-114)/(55-72) 98/66 mmHg (04/05 0447) SpO2:  [91 %-97 %] 91 % (04/05 0447) Weight:  [77 kg (169 lb 12.1 oz)] 77 kg (169 lb 12.1 oz) (04/05 0447) Weight change: 4.4 kg (9 lb 11.2 oz)  Intake/Output from previous day: 04/04 0701 - 04/05 0700 In: 940 [P.O.:940] Out: 1000 [Urine:1000] Intake/Output this shift:   generally she is alert and in no apparent distress. Chest decreased breath sound bilaterally. She has some expiratory wheezing. Heart exam revealed regular rate and rhythm and no S3 Abdomen soft nontender positive bowel sound Extremities: Patient is still has trace to 1+  edema.    Lab Results: No results found for this basename: WBC, HGB, HCT, PLT,  in the last 72 hours BMET:   Recent Labs  07/31/13 0623 08/01/13 0626  NA 142 141  K 4.7 4.3  CL 103 102  CO2 28 30  GLUCOSE 143* 67*  BUN 72* 76*  CREATININE 2.39* 2.53*  CALCIUM 9.0 9.2   No results found for this basename: PTH,  in the last 72 hours Iron Studies: No results found for this basename: IRON, TIBC, TRANSFERRIN, FERRITIN,  in the last 72 hours  Studies/Results: No results found.  I have reviewed the patient's current medications.  Assessment/Plan: Problem #1 renal failure chronic: . Her renal function is slowly worsening possibly from diuretics.Presently she on demadex Problem #2 CHF/anasarca: Is improving and presently she is on Demadex and none oliguric Problem #3 history of pulmonary hypertension moderate to severe Problem #4 anemia: Most likely secondary to chronic renal failure Problem #5 history of a trial fibrillation Problem #6 hypertension: Her systolic blood pressure is reasonably controlled Problem #7 history of diabetes  Problem #8  metabolic bone disease: Her calcium and phosphorus in the range.  Problem #9 history of monoclonal gammopathy of unknown significance. Patient has high Kappa to lambda ratio . Plan: continue with present treatment. Basic metabolic panel in am    LOS: 5 days   Evalynne Locurto S 08/01/2013,8:46 AM

## 2013-08-01 NOTE — Progress Notes (Signed)
NAME:  Catherine [name], Catherine [name]                   ACCOUNT NO.:  [id number]  MEDICAL RECORD NO.:  [id number]  LOCATION:  W656                          FACILITY:  [facility]  PHYSICIAN:  Paula Compton. Willey Blade, MD       DATE OF BIRTH:  1923/11/23  DATE OF PROCEDURE:  08/01/2013 DATE OF DISCHARGE:                                PROGRESS NOTE   SUBJECTIVE:  [name] is feeling better.  Her swelling is improving.  She diuresed 1000 mL yesterday.  OBJECTIVE:  VITAL SIGNS:  Her weight is recorded at 169, blood pressure 96/67, pulse 89, temperature 97.3. LUNGS:  Minimal bilateral rales. HEART:  Irregular with a grade 3 systolic murmur. ABDOMEN:  Mildly distended but nontender. EXTREMITIES:  Improved edema in the upper and lower extremities.  IMPRESSION/PLAN: 1. Acute on chronic diastolic heart failure, improving.  Nephrology     has switched her from IV Lasix to oral Demadex.  BUN and creatinine     today are up to 76 and 2.53.  Potassium is stable at 4.3. 2. Diabetes.  Glucose is 67. 3. Chronic atrial fibrillation.  Continue Xarelto. 4. Pulmonary hypertension.     Paula Compton. Willey Blade, MD     ROF/MEDQ  D:  08/01/2013  T:  08/01/2013  Job:  [id number]

## 2013-08-02 LAB — BASIC METABOLIC PANEL
BUN: 73 mg/dL — ABNORMAL HIGH (ref 6–23)
CALCIUM: 9.4 mg/dL (ref 8.4–10.5)
CO2: 32 mEq/L (ref 19–32)
CREATININE: 2.35 mg/dL — AB (ref 0.50–1.10)
Chloride: 104 mEq/L (ref 96–112)
GFR calc Af Amer: 20 mL/min — ABNORMAL LOW (ref >90)
GFR, EST NON AFRICAN AMERICAN: 18 mL/min — AB (ref >90)
GLUCOSE: 77 mg/dL (ref 70–99)
Potassium: 3.8 mEq/L (ref 3.7–5.3)
Sodium: 146 mEq/L (ref 137–147)

## 2013-08-02 LAB — GLUCOSE, CAPILLARY
Glucose-Capillary: 56 mg/dL — ABNORMAL LOW (ref 70–99)
Glucose-Capillary: 91 mg/dL (ref 70–99)
Glucose-Capillary: 72 mg/dL (ref 70–99)

## 2013-08-02 MED ORDER — CALCITRIOL 0.25 MCG PO CAPS: 0.2500 ug | ORAL_CAPSULE | Freq: Every day | ORAL | Status: DC

## 2013-08-02 MED ORDER — TORSEMIDE 20 MG PO TABS: 20.0000 mg | ORAL_TABLET | Freq: Every day | ORAL | Status: DC

## 2013-08-02 NOTE — Progress Notes (Signed)
Nutrition Brief Note  RD pulled to chart due to LOS.  Wt Readings from Last 15 Encounters:  08/02/13 164 lb 0.4 oz (74.4 kg)  06/26/13 158 lb 1.1 oz (71.7 kg)  06/10/13 165 lb (74.844 kg)  05/18/13 147 lb (66.679 kg)  04/20/13 148 lb (67.132 kg)  03/22/13 147 lb 4 oz (66.792 kg)  02/24/13 153 lb (69.4 kg)  02/02/13 150 lb 1.6 oz (68.085 kg)  01/12/13 136 lb 4.8 oz (61.825 kg)  01/08/13 139 lb (63.05 kg)  12/05/12 139 lb 8.8 oz (63.3 kg)  11/26/12 139 lb (63.05 kg)  10/22/12 138 lb (62.596 kg)  10/09/12 137 lb 12.8 oz (62.506 kg)  06/09/12 118 lb 9.6 oz (53.797 kg)    Body mass index is 29.06 kg/(m^2). Patient meets criteria for overweight based on current BMI.   Current diet order is carb modified, patient is consuming approximately 50-100% of meals at this time. Labs and medications reviewed.   No nutrition interventions warranted at this time. If nutrition issues arise, please consult RD.   Catherine [name], RD, LDN Pager: [phone]

## 2013-08-02 NOTE — Discharge Summary (Signed)
NAME:  Catherine [name], Catherine [name]                   ACCOUNT NO.:  [id number]  MEDICAL RECORD NO.:  [id number]  LOCATION:  Z308                          FACILITY:  [facility]  PHYSICIAN:  Paula Compton. Willey Blade, MD       DATE OF BIRTH:  1923-07-08  DATE OF ADMISSION:  07/27/2013 DATE OF DISCHARGE:  LH                         DISCHARGE SUMMARY-REFERRING   ADDENDUM:  DISCHARGE MEDICATIONS: 1. Torsemide 20 mg q.a.m. 2. Acetaminophen 650 mg q.6 hours p.r.n. 3. Proventil nebulizer solution q.4 hours p.r.n. 4. Atorvastatin 10 mg daily. 5. Rocaltrol 0.25 mcg daily. 6. Os-Cal 500 mg b.i.d. 7. Vitamin D 2000 units daily. 8. Citalopram 20 mg daily. 9. Clonazepam 0.5 mg t.i.d. p.r.n. 10.Vitamin B12 1000 mcg monthly. 11.Diltiazem CD 240 mg daily. 12.Ferrous sulfate 325 mg daily. 13.Lidex 0.05% cream daily. 14.Lantus 12 units daily. 15.Lopressor 50 mg b.i.d. 16.Remeron 7.5 mg at bedtime. 17.Protonix 40 mg daily. 18.MiraLAX 17 g daily p.r.n. 19.Potassium 40 mEq daily. 20.Xarelto 15 mg daily.     Paula Compton. Willey Blade, MD     ROF/MEDQ  D:  08/02/2013  T:  08/02/2013  Job:  [id number]

## 2013-08-02 NOTE — Progress Notes (Signed)
Subjective: Interval History: no new complaint. No nausea pr vomiting  Objective: Vital signs in last 24 hours: Temp:  [97.3 F (36.3 C)-97.8 F (36.6 C)] 97.6 F (36.4 C) (04/06 0535) Pulse Rate:  [86-96] 86 (04/06 0535) Resp:  [16] 16 (04/06 0535) BP: (95-180)/(59-66) 95/60 mmHg (04/06 0535) SpO2:  [96 %-98 %] 97 % (04/06 0535) Weight:  [74.4 kg (164 lb 0.4 oz)] 74.4 kg (164 lb 0.4 oz) (04/06 0535) Weight change: -2.6 kg (-5 lb 11.7 oz)  Intake/Output from previous day: 04/05 0701 - 04/06 0700 In: 236 [P.O.:236] Out: 2100 [Urine:2100] Intake/Output this shift: Total I/O In: 240 [P.O.:240] Out: -  generally she is more alert and in no apparent distress. Chest decreased breath sound bilaterally. She has some expiratory wheezing. Heart exam revealed regular rate and rhythm and no S3 Abdomen soft nontender positive bowel sound Extremities: Patient is still has trace   edema.    Lab Results: No results found for this basename: WBC, HGB, HCT, PLT,  in the last 72 hours BMET:   Recent Labs  08/01/13 0626 08/02/13 0547  NA 141 146  K 4.3 3.8  CL 102 104  CO2 30 32  GLUCOSE 67* 77  BUN 76* 73*  CREATININE 2.53* 2.35*  CALCIUM 9.2 9.4   No results found for this basename: PTH,  in the last 72 hours Iron Studies: No results found for this basename: IRON, TIBC, TRANSFERRIN, FERRITIN,  in the last 72 hours  Studies/Results: No results found.  I have reviewed the patient's current medications.  Assessment/Plan: Problem #1 renal failure chronic: . Her renal function is improving Problem #2 CHF/anasarca: Is improving and presently she is on Demadex and none oliguric. She seems getting better Problem #3 history of pulmonary hypertension moderate to severe Problem #4 anemia: Most likely secondary to chronic renal failure Problem #5 history of a trial fibrillation Problem #6 hypertension: Her systolic blood pressure is reasonably controlled Problem #7 history of  diabetes  Problem #8 metabolic bone disease: Her calcium and phosphorus in the range.  Problem #9 history of monoclonal gammopathy of unknown significance.  Plan: continue with present treatment.    LOS: 6 days   Tayari Yankee S 08/02/2013,9:05 AM

## 2013-08-02 NOTE — Progress Notes (Signed)
Pt discharged back to [facility] per Dr. Willey Blade. Pt's IV site D/C'd and WNL. Pt's VSS. Pt to leave by facility transport. Arranged by SW. Phoebe Perch packet given to transporter, AVS copy signed and placed on shadow chart. Pt in stable condition at time of discharge.

## 2013-08-02 NOTE — Clinical Social Work Note (Signed)
Patient ready for discharge today, will return to [facility] via facility transport.  Patient and facility informed and agreeable.  FL2 reviewed w RN and updated as needed.  CSW spoke w MD re need for meds on discharge summary, addendum done.  Discharge packet prepared and placed w shadow chart for transport.  CSW verified w patient that she did not want anyone contacted re her discharge, says that [facility] is sufficient.  CSW signing off as no further SW needs identified.  Edwyna Shell, LCSW Clinical Social Worker [phone])

## 2013-08-02 NOTE — Discharge Summary (Signed)
NAME:  Catherine [name], Catherine [name]                   ACCOUNT NO.:  [id number]  MEDICAL RECORD NO.:  [id number]  LOCATION:                                 FACILITY:  PHYSICIAN:  Paula Compton. Willey Blade, MD       DATE OF BIRTH:  22-Jul-1923  DATE OF ADMISSION:  07/27/2013 DATE OF DISCHARGE:  LH                              DISCHARGE SUMMARY   DISCHARGE DIAGNOSES: 1. Acute on chronic diastolic heart failure. 2. Chronic kidney disease. 3. Chronic atrial fibrillation. 4. Type 2 diabetes. 5. Pulmonary hypertension. 6. Monoclonal gammopathy of undetermined significance. 7. Acute on chronic respiratory failure. 8. Depression and anxiety. 9. Hypertension. 10.Anemia.  HOSPITAL COURSE:  This patient is an 80 year old female who presented with shortness of breath and diffuse edema.  She had a proBNP of 12,316. BUN and creatinine were 66 and 2.5.  Hemoglobin was 10.6.  Chest x-ray revealed diffuse interstitial edema.  She was treated with IV Lasix.  She was seen again by Cardiology.  She was seen by Nephrology.  She was treated with IV Lasix and then modified to oral torsemide.  Renal ultrasound revealed no hydronephrosis.  BUN and creatinine are 73 and 2.35 on discharge with a potassium of 3.8.  She diuresed well and had marked improvement in her edema in her arms and legs and abdomen.  Her shortness of breath resolved.  She has chronic respiratory failure, however, due to pulmonary hypertension and requires ongoing home oxygen therapy.  She has MGUS and has stable blood counts.  Her diabetes was stable on Lantus.  She had a repeat echocardiogram revealing an ejection fraction of 60% with no wall motion abnormalities.  Urine culture was negative.  She has a chronic indwelling Foley due to urinary retention, which she has been followed by Urology.  Her overall condition has significantly improved on the 6th.  She will have follow up in my office in 1 week.  She will continue outpatient oral diuretic therapy  with torsemide 20 mg daily.     Paula Compton. Willey Blade, MD     ROF/MEDQ  D:  08/02/2013  T:  08/02/2013  Job:  [id number]

## 2013-08-05 ENCOUNTER — Ambulatory Visit (HOSPITAL_COMMUNITY): Payer: Medicare Other

## 2013-08-06 NOTE — Progress Notes (Signed)
This encounter was created in error - please disregard.

## 2013-08-09 ENCOUNTER — Other Ambulatory Visit: Payer: Self-pay

## 2013-08-09 ENCOUNTER — Encounter (HOSPITAL_COMMUNITY): Payer: Self-pay | Admitting: Emergency Medicine

## 2013-08-09 ENCOUNTER — Emergency Department (HOSPITAL_COMMUNITY): Payer: Medicare Other

## 2013-08-09 ENCOUNTER — Inpatient Hospital Stay (HOSPITAL_COMMUNITY)
Admission: EM | Admit: 2013-08-09 | Discharge: 2013-08-14 | DRG: 689 | Disposition: A | Discharge status: Other Acute Inpatient Hospital | Payer: Medicare Other | Attending: Internal Medicine | Admitting: Internal Medicine

## 2013-08-09 DIAGNOSIS — I4891 Unspecified atrial fibrillation: Secondary | ICD-10-CM

## 2013-08-09 DIAGNOSIS — Z853 Personal history of malignant neoplasm of breast: Secondary | ICD-10-CM

## 2013-08-09 DIAGNOSIS — I340 Nonrheumatic mitral (valve) insufficiency: Secondary | ICD-10-CM

## 2013-08-09 DIAGNOSIS — Q828 Other specified congenital malformations of skin: Secondary | ICD-10-CM

## 2013-08-09 DIAGNOSIS — I509 Heart failure, unspecified: Secondary | ICD-10-CM | POA: Diagnosis present

## 2013-08-09 DIAGNOSIS — I2789 Other specified pulmonary heart diseases: Secondary | ICD-10-CM | POA: Diagnosis present

## 2013-08-09 DIAGNOSIS — Z960 Presence of urogenital implants: Secondary | ICD-10-CM

## 2013-08-09 DIAGNOSIS — D472 Monoclonal gammopathy: Secondary | ICD-10-CM | POA: Diagnosis present

## 2013-08-09 DIAGNOSIS — K5900 Constipation, unspecified: Secondary | ICD-10-CM | POA: Diagnosis present

## 2013-08-09 DIAGNOSIS — I129 Hypertensive chronic kidney disease with stage 1 through stage 4 chronic kidney disease, or unspecified chronic kidney disease: Secondary | ICD-10-CM | POA: Diagnosis present

## 2013-08-09 DIAGNOSIS — I5033 Acute on chronic diastolic (congestive) heart failure: Secondary | ICD-10-CM

## 2013-08-09 DIAGNOSIS — B965 Pseudomonas (aeruginosa) (mallei) (pseudomallei) as the cause of diseases classified elsewhere: Secondary | ICD-10-CM | POA: Diagnosis present

## 2013-08-09 DIAGNOSIS — Z9010 Acquired absence of unspecified breast and nipple: Secondary | ICD-10-CM

## 2013-08-09 DIAGNOSIS — N390 Urinary tract infection, site not specified: Principal | ICD-10-CM

## 2013-08-09 DIAGNOSIS — D638 Anemia in other chronic diseases classified elsewhere: Secondary | ICD-10-CM | POA: Diagnosis present

## 2013-08-09 DIAGNOSIS — R609 Edema, unspecified: Secondary | ICD-10-CM

## 2013-08-09 DIAGNOSIS — E876 Hypokalemia: Secondary | ICD-10-CM | POA: Diagnosis present

## 2013-08-09 DIAGNOSIS — N184 Chronic kidney disease, stage 4 (severe): Secondary | ICD-10-CM | POA: Diagnosis present

## 2013-08-09 DIAGNOSIS — M810 Age-related osteoporosis without current pathological fracture: Secondary | ICD-10-CM | POA: Diagnosis present

## 2013-08-09 DIAGNOSIS — Z833 Family history of diabetes mellitus: Secondary | ICD-10-CM

## 2013-08-09 DIAGNOSIS — N179 Acute kidney failure, unspecified: Secondary | ICD-10-CM | POA: Diagnosis present

## 2013-08-09 DIAGNOSIS — J449 Chronic obstructive pulmonary disease, unspecified: Secondary | ICD-10-CM | POA: Diagnosis present

## 2013-08-09 DIAGNOSIS — Z978 Presence of other specified devices: Secondary | ICD-10-CM

## 2013-08-09 DIAGNOSIS — R339 Retention of urine, unspecified: Secondary | ICD-10-CM | POA: Diagnosis present

## 2013-08-09 DIAGNOSIS — I10 Essential (primary) hypertension: Secondary | ICD-10-CM

## 2013-08-09 DIAGNOSIS — G729 Myopathy, unspecified: Secondary | ICD-10-CM | POA: Diagnosis present

## 2013-08-09 DIAGNOSIS — Z8673 Personal history of transient ischemic attack (TIA), and cerebral infarction without residual deficits: Secondary | ICD-10-CM

## 2013-08-09 DIAGNOSIS — E119 Type 2 diabetes mellitus without complications: Secondary | ICD-10-CM | POA: Diagnosis present

## 2013-08-09 DIAGNOSIS — Z794 Long term (current) use of insulin: Secondary | ICD-10-CM

## 2013-08-09 DIAGNOSIS — Z9981 Dependence on supplemental oxygen: Secondary | ICD-10-CM

## 2013-08-09 DIAGNOSIS — J4489 Other specified chronic obstructive pulmonary disease: Secondary | ICD-10-CM | POA: Diagnosis present

## 2013-08-09 DIAGNOSIS — Z96649 Presence of unspecified artificial hip joint: Secondary | ICD-10-CM

## 2013-08-09 DIAGNOSIS — Z66 Do not resuscitate: Secondary | ICD-10-CM | POA: Diagnosis present

## 2013-08-09 DIAGNOSIS — Z8249 Family history of ischemic heart disease and other diseases of the circulatory system: Secondary | ICD-10-CM

## 2013-08-09 DIAGNOSIS — Q819 Epidermolysis bullosa, unspecified: Secondary | ICD-10-CM

## 2013-08-09 DIAGNOSIS — R5381 Other malaise: Secondary | ICD-10-CM | POA: Diagnosis present

## 2013-08-09 DIAGNOSIS — J9610 Chronic respiratory failure, unspecified whether with hypoxia or hypercapnia: Secondary | ICD-10-CM | POA: Diagnosis present

## 2013-08-09 HISTORY — DX: Chronic kidney disease, stage 4 (severe): N18.4

## 2013-08-09 HISTORY — DX: Type 2 diabetes mellitus without complications: E11.9

## 2013-08-09 HISTORY — DX: Unspecified diastolic (congestive) heart failure: I50.30

## 2013-08-09 HISTORY — DX: Essential (primary) hypertension: I10

## 2013-08-09 HISTORY — DX: Age-related osteoporosis without current pathological fracture: M81.0

## 2013-08-09 HISTORY — DX: Nonrheumatic mitral (valve) insufficiency: I34.0

## 2013-08-09 LAB — COMPREHENSIVE METABOLIC PANEL
ALK PHOS: 85 U/L (ref 39–117)
ALT: 9 U/L (ref 0–35)
AST: 16 U/L (ref 0–37)
Albumin: 3.2 g/dL — ABNORMAL LOW (ref 3.5–5.2)
BUN: 72 mg/dL — ABNORMAL HIGH (ref 6–23)
CO2: 29 meq/L (ref 19–32)
Calcium: 9.8 mg/dL (ref 8.4–10.5)
Chloride: 97 mEq/L (ref 96–112)
Creatinine, Ser: 2.67 mg/dL — ABNORMAL HIGH (ref 0.50–1.10)
GFR calc Af Amer: 17 mL/min — ABNORMAL LOW (ref >90)
GFR, EST NON AFRICAN AMERICAN: 15 mL/min — AB (ref >90)
Glucose, Bld: 118 mg/dL — ABNORMAL HIGH (ref 70–99)
POTASSIUM: 4.4 meq/L (ref 3.7–5.3)
SODIUM: 140 meq/L (ref 137–147)
TOTAL PROTEIN: 7.3 g/dL (ref 6.0–8.3)
Total Bilirubin: 0.5 mg/dL (ref 0.3–1.2)

## 2013-08-09 LAB — URINE MICROSCOPIC-ADD ON

## 2013-08-09 LAB — GLUCOSE, CAPILLARY
GLUCOSE-CAPILLARY: 113 mg/dL — AB (ref 70–99)
GLUCOSE-CAPILLARY: 147 mg/dL — AB (ref 70–99)
GLUCOSE-CAPILLARY: 146 mg/dL — AB (ref 70–99)

## 2013-08-09 LAB — CBC WITH DIFFERENTIAL/PLATELET
BASOS PCT: 1 % (ref 0–1)
Basophils Absolute: 0 10*3/uL (ref 0.0–0.1)
Eosinophils Absolute: 0.3 10*3/uL (ref 0.0–0.7)
Eosinophils Relative: 8 % — ABNORMAL HIGH (ref 0–5)
HCT: 29.7 % — ABNORMAL LOW (ref 36.0–46.0)
HEMOGLOBIN: 9.8 g/dL — AB (ref 12.0–15.0)
Lymphocytes Relative: 9 % — ABNORMAL LOW (ref 12–46)
Lymphs Abs: 0.4 10*3/uL — ABNORMAL LOW (ref 0.7–4.0)
MCH: 33.9 pg (ref 26.0–34.0)
MCHC: 33 g/dL (ref 30.0–36.0)
MCV: 102.8 fL — ABNORMAL HIGH (ref 78.0–100.0)
MONO ABS: 0.7 10*3/uL (ref 0.1–1.0)
MONOS PCT: 17 % — AB (ref 3–12)
Neutro Abs: 2.7 10*3/uL (ref 1.7–7.7)
Neutrophils Relative %: 66 % (ref 43–77)
Platelets: 183 10*3/uL (ref 150–400)
RBC: 2.89 MIL/uL — ABNORMAL LOW (ref 3.87–5.11)
RDW: 15.6 % — ABNORMAL HIGH (ref 11.5–15.5)
WBC: 4.2 10*3/uL (ref 4.0–10.5)

## 2013-08-09 LAB — URINALYSIS, ROUTINE W REFLEX MICROSCOPIC
BILIRUBIN URINE: NEGATIVE
GLUCOSE, UA: NEGATIVE mg/dL
KETONES UR: NEGATIVE mg/dL
Nitrite: NEGATIVE
PROTEIN: NEGATIVE mg/dL
Specific Gravity, Urine: 1.015 (ref 1.005–1.030)
Urobilinogen, UA: 0.2 mg/dL (ref 0.0–1.0)
pH: 5 (ref 5.0–8.0)

## 2013-08-09 LAB — LIPASE, BLOOD: Lipase: 32 U/L (ref 11–59)

## 2013-08-09 LAB — PRO B NATRIURETIC PEPTIDE: PRO B NATRI PEPTIDE: 10577 pg/mL — AB (ref 0–450)

## 2013-08-09 LAB — TROPONIN I: Troponin I: <0.3 ng/mL (ref <0.30)

## 2013-08-09 MED ORDER — ACETAMINOPHEN 325 MG PO TABS: 650.0000 mg | ORAL_TABLET | Freq: Four times a day (QID) | ORAL | Status: DC | PRN

## 2013-08-09 MED ORDER — FUROSEMIDE 10 MG/ML IJ SOLN
80.0000 mg | Freq: Two times a day (BID) | INTRAMUSCULAR | Status: DC
  Administered 2013-08-09 – 2013-08-12 (×7): 80 mg via INTRAVENOUS
  Filled 2013-08-09 (×9): qty 8

## 2013-08-09 MED ORDER — CIPROFLOXACIN IN D5W 400 MG/200ML IV SOLN
400.0000 mg | Freq: Once | INTRAVENOUS | Status: AC
  Administered 2013-08-09: 400 mg via INTRAVENOUS
  Filled 2013-08-09: qty 200

## 2013-08-09 MED ORDER — CLONAZEPAM 0.5 MG PO TABS
0.5000 mg | ORAL_TABLET | Freq: Three times a day (TID) | ORAL | Status: DC | PRN
  Administered 2013-08-09 – 2013-08-11 (×2): 0.5 mg via ORAL
  Filled 2013-08-09 (×2): qty 1

## 2013-08-09 MED ORDER — DEXTROSE 5 % IV SOLN
1.0000 g | INTRAVENOUS | Status: DC
  Administered 2013-08-09 – 2013-08-10 (×2): 1 g via INTRAVENOUS
  Filled 2013-08-09 (×3): qty 10

## 2013-08-09 MED ORDER — ATORVASTATIN CALCIUM 10 MG PO TABS
10.0000 mg | ORAL_TABLET | Freq: Every day | ORAL | Status: DC
  Administered 2013-08-09 – 2013-08-13 (×5): 10 mg via ORAL
  Filled 2013-08-09 (×5): qty 1

## 2013-08-09 MED ORDER — INSULIN ASPART 100 UNIT/ML ~~LOC~~ SOLN
0.0000 [IU] | Freq: Three times a day (TID) | SUBCUTANEOUS | Status: DC
  Administered 2013-08-09 – 2013-08-13 (×6): 2 [IU] via SUBCUTANEOUS
  Administered 2013-08-14: 3 [IU] via SUBCUTANEOUS

## 2013-08-09 MED ORDER — ACETAMINOPHEN 650 MG RE SUPP: 650.0000 mg | Freq: Four times a day (QID) | RECTAL | Status: DC | PRN

## 2013-08-09 MED ORDER — MIRTAZAPINE 15 MG PO TABS
7.5000 mg | ORAL_TABLET | Freq: Every day | ORAL | Status: DC
  Administered 2013-08-09 – 2013-08-13 (×5): 7.5 mg via ORAL
  Filled 2013-08-09 (×7): qty 0.5

## 2013-08-09 MED ORDER — SODIUM CHLORIDE 0.9 % IV SOLN
INTRAVENOUS | Status: DC
  Administered 2013-08-09: 08:00:00 via INTRAVENOUS

## 2013-08-09 MED ORDER — FERROUS SULFATE 325 (65 FE) MG PO TABS
325.0000 mg | ORAL_TABLET | Freq: Every day | ORAL | Status: DC
  Administered 2013-08-10 – 2013-08-14 (×5): 325 mg via ORAL
  Filled 2013-08-09 (×5): qty 1

## 2013-08-09 MED ORDER — POLYETHYLENE GLYCOL 3350 17 G PO PACK: 17.0000 g | PACK | Freq: Every day | ORAL | Status: DC | PRN

## 2013-08-09 MED ORDER — INSULIN ASPART 100 UNIT/ML ~~LOC~~ SOLN: 0.0000 [IU] | Freq: Every day | SUBCUTANEOUS | Status: DC

## 2013-08-09 MED ORDER — ONDANSETRON HCL 4 MG PO TABS: 4.0000 mg | ORAL_TABLET | Freq: Four times a day (QID) | ORAL | Status: DC | PRN

## 2013-08-09 MED ORDER — TRIAMCINOLONE ACETONIDE 0.1 % EX CREA
1.0000 "application " | TOPICAL_CREAM | Freq: Two times a day (BID) | CUTANEOUS | Status: DC
  Administered 2013-08-09 – 2013-08-14 (×11): 1 via TOPICAL
  Filled 2013-08-09: qty 15

## 2013-08-09 MED ORDER — FLUOCINONIDE 0.05 % EX CREA
1.0000 "application " | TOPICAL_CREAM | Freq: Every day | CUTANEOUS | Status: DC
  Administered 2013-08-09 – 2013-08-14 (×6): 1 via TOPICAL
  Filled 2013-08-09: qty 30

## 2013-08-09 MED ORDER — SODIUM CHLORIDE 0.9 % IJ SOLN
3.0000 mL | Freq: Two times a day (BID) | INTRAMUSCULAR | Status: DC
  Administered 2013-08-09 – 2013-08-14 (×5): 3 mL via INTRAVENOUS

## 2013-08-09 MED ORDER — RIVAROXABAN 15 MG PO TABS
15.0000 mg | ORAL_TABLET | Freq: Every day | ORAL | Status: DC
  Administered 2013-08-09 – 2013-08-13 (×5): 15 mg via ORAL
  Filled 2013-08-09 (×5): qty 1

## 2013-08-09 MED ORDER — SODIUM CHLORIDE 0.9 % IJ SOLN: 3.0000 mL | INTRAMUSCULAR | Status: DC | PRN

## 2013-08-09 MED ORDER — SODIUM CHLORIDE 0.9 % IV SOLN
INTRAVENOUS | Status: DC
Start: 2013-08-10 — End: 2013-08-09

## 2013-08-09 MED ORDER — POTASSIUM CHLORIDE CRYS ER 20 MEQ PO TBCR
40.0000 meq | EXTENDED_RELEASE_TABLET | Freq: Every day | ORAL | Status: DC
  Administered 2013-08-09 – 2013-08-11 (×3): 40 meq via ORAL
  Filled 2013-08-09 (×4): qty 2

## 2013-08-09 MED ORDER — SODIUM CHLORIDE 0.9 % IV SOLN: 250.0000 mL | INTRAVENOUS | Status: DC | PRN

## 2013-08-09 MED ORDER — INSULIN GLARGINE 100 UNIT/ML ~~LOC~~ SOLN
6.0000 [IU] | Freq: Every day | SUBCUTANEOUS | Status: DC
Start: 2013-08-09 — End: 2013-08-14
  Administered 2013-08-09 – 2013-08-13 (×5): 6 [IU] via SUBCUTANEOUS
  Filled 2013-08-09 (×7): qty 0.06

## 2013-08-09 MED ORDER — PANTOPRAZOLE SODIUM 40 MG PO TBEC
40.0000 mg | DELAYED_RELEASE_TABLET | Freq: Every day | ORAL | Status: DC
  Administered 2013-08-09 – 2013-08-14 (×6): 40 mg via ORAL
  Filled 2013-08-09 (×6): qty 1

## 2013-08-09 MED ORDER — CITALOPRAM HYDROBROMIDE 20 MG PO TABS
20.0000 mg | ORAL_TABLET | Freq: Every day | ORAL | Status: DC
  Administered 2013-08-09 – 2013-08-14 (×6): 20 mg via ORAL
  Filled 2013-08-09 (×6): qty 1

## 2013-08-09 MED ORDER — MUPIROCIN 2 % EX OINT
1.0000 "application " | TOPICAL_OINTMENT | Freq: Two times a day (BID) | CUTANEOUS | Status: DC
  Administered 2013-08-09 – 2013-08-14 (×11): 1 via TOPICAL
  Filled 2013-08-09: qty 22

## 2013-08-09 MED ORDER — CALCITRIOL 0.25 MCG PO CAPS
0.2500 ug | ORAL_CAPSULE | Freq: Every day | ORAL | Status: DC
  Administered 2013-08-09 – 2013-08-14 (×6): 0.25 ug via ORAL
  Filled 2013-08-09 (×6): qty 1

## 2013-08-09 MED ORDER — HYDROCODONE-ACETAMINOPHEN 5-325 MG PO TABS
1.0000 | ORAL_TABLET | ORAL | Status: DC | PRN
  Administered 2013-08-09: 1 via ORAL
  Filled 2013-08-09: qty 1

## 2013-08-09 MED ORDER — VITAMIN D 1000 UNITS PO TABS
2000.0000 [IU] | ORAL_TABLET | Freq: Every day | ORAL | Status: DC
  Administered 2013-08-09 – 2013-08-14 (×6): 2000 [IU] via ORAL
  Filled 2013-08-09 (×6): qty 2

## 2013-08-09 MED ORDER — SODIUM CHLORIDE 0.9 % IJ SOLN
3.0000 mL | Freq: Two times a day (BID) | INTRAMUSCULAR | Status: DC
  Administered 2013-08-09 – 2013-08-13 (×5): 3 mL via INTRAVENOUS

## 2013-08-09 MED ORDER — ONDANSETRON HCL 4 MG/2ML IJ SOLN
4.0000 mg | Freq: Four times a day (QID) | INTRAMUSCULAR | Status: DC | PRN
Start: 2013-08-09 — End: 2013-08-14

## 2013-08-09 NOTE — Plan of Care (Signed)
All wounds redressed with new dressings.  ABD, curlix and paper tape. (Legs, hips, shoulders and chest.)

## 2013-08-09 NOTE — H&P (Signed)
Patient seen and examined. The above note reviewed.  The patient presents with urinary tract infection, she has chronic Foley catheter for urinary retention. She's been started on antibiotics. She also has significant evidence of volume overload. She has lower extremity edema. She has noted her weight increasing. She does appear to be short of breath. Chest x-ray does indicate vascular congestion. She is on 80 of oral Lasix twice a day. This will be changed to Lasix 80 mg IV twice a day. We'll monitor strict I.'s and O.'s. 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 We'll dress her wounds with gauze for now. Consider wound care consult. The contact information of her dermatologist at [facility] has been provided by her niece and is located in the chart in case her condition needs to be discussed with him.  [employer]

## 2013-08-09 NOTE — ED Notes (Signed)
Report given to RN on unit 300. Ready to receive patient.  

## 2013-08-09 NOTE — H&P (Signed)
[facility] History and Physical  Catherine [name] KNL:[id number] DOB: [birth year] DOA: 08/09/2013  Referring physician:  PCP: Asencion Noble, MD   Chief Complaint:   HPI: Catherine [name] is a 80 y.o. female a history of diastolic congestive heart failure, diabetes, A. fib, hypertension presents to the emergency department with a 3 to four-day history of worsening lower extremity edema and weight gain progressing to shortness of breath yesterday. Initial workup in the emergency room reveals a urinary tract infection and acute on chronic diastolic heart failure with acute on chronic renal failure.  Patient reports she was discharged from the hospital one week ago and admitted to [facility]. During this 7 day timeframe she noticed a gradual accumulation of fluid in her lower extremities. She reports that she has been given her medication and adhering to her diet. During the last week "to fluid just Building and building". Yesterday she developed worsening shortness of breath. She denies chest pain palpitations headache dizziness syncope or near-syncope. She denies abdominal pain nausea vomiting diarrhea fever or chills. She does have a chronic Foley catheter for urinary retention.  Lab work in the emergency department significant for creatinine of 2.62 proBNP 10,577 hemoglobin 9.8. Troponin is negative and lactic acid is within the limits of normal. Urinalysis consistent with urinary tract infection. Chest x-ray reveals stable cardiomegaly. Central pulmonary vascular congestion noted on prior exam is significantly improved. Minimal right apical subsegmental atelectasis is noted. EKG with atrial fibrillation.  In the emergency room she is given one dose of Cipro. We are asked to admit  Review of Systems:  10 point review of systems completed and all systems are negative except as indicated in the history of present illness   Past Medical History  Diagnosis Date  . CHF (congestive heart failure)     . Diabetes mellitus   . Hypertension   . COPD (chronic obstructive pulmonary disease)   . Vertigo   . Cancer     breast with mastectomy  . Stroke      patient denies  . Skin disorder      Epidermolysis bullosa.  . Pulmonary hypertension   . Atrial fibrillation 2011  . Multiple thyroid nodules   . Anemia   . Breast cancer   . Anemia of other chronic disease 06/09/2012  . Aortic stenosis   . Monoclonal gammopathy   . Hypothermia   . Diverticulosis   . Squamous cell carcinoma   . Osteoporosis, unspecified 02/24/2013  . Chronic kidney disease   . On home O2     2L N/C   Past Surgical History  Procedure Laterality Date  . Cholecystectomy    . Hemorrhoid surgery    . Abdominal hysterectomy  1991    complete hysterectomy for cancer  . Mastectomy  2005    left breast, tamoxifin  . Goiter    . Hip fracture surgery  2010    right hip replacement  . Hip fracture surgery  2006    left hip replacement  . Colonoscopy  ?    remote  . Cataract extraction, bilateral     Social History:  reports that she has never smoked. She has never used smokeless tobacco. She reports that she does not drink alcohol or use illicit drugs. She is a resident of [facility]. Allergies  Allergen Reactions  . Amoxicillin-Pot Clavulanate Diarrhea    Family History  Problem Relation Age of Onset  . Colon cancer Neg Hx   .  Heart attack Son     age 4s, suspected MI  . Diabetes Mother      Prior to Admission medications   Medication Sig Start Date End Date Taking? Authorizing Provider  acetaminophen (TYLENOL) 325 MG tablet Take 650 mg by mouth every 6 (six) hours as needed for mild pain, fever or headache.    Yes Historical Provider, MD  atorvastatin (LIPITOR) 10 MG tablet Take 10 mg by mouth at bedtime.   Yes Historical Provider, MD  calcitRIOL (ROCALTROL) 0.25 MCG capsule Take 1 capsule (0.25 mcg total) by mouth daily. 08/02/13  Yes Asencion Noble, MD  cholecalciferol (VITAMIN D) 1000  UNITS tablet Take 2,000 Units by mouth daily.   Yes Historical Provider, MD  citalopram (CELEXA) 20 MG tablet Take 20 mg by mouth daily.   Yes Historical Provider, MD  clonazePAM (KLONOPIN) 0.5 MG tablet Take 0.5 mg by mouth 3 (three) times daily as needed for anxiety.   Yes Historical Provider, MD  cyanocobalamin (,VITAMIN B-12,) 1000 MCG/ML injection Inject 1,000 mcg into the muscle every 30 (thirty) days.   Yes Historical Provider, MD  diltiazem (DILACOR XR) 240 MG 24 hr capsule Take 240 mg by mouth daily.   Yes Historical Provider, MD  ferrous sulfate 325 (65 FE) MG tablet Take 325 mg by mouth daily with breakfast.   Yes Historical Provider, MD  fluocinonide cream (LIDEX) 5.00 % Apply 1 application topically daily. Apply sparingly to itchy areas on trunk & extremeties   Yes Historical Provider, MD  furosemide (LASIX) 80 MG tablet Take 80 mg by mouth 2 (two) times daily.   Yes Historical Provider, MD  insulin glargine (LANTUS) 100 UNIT/ML injection Inject 12 Units into the skin daily.   Yes Historical Provider, MD  metoprolol (LOPRESSOR) 50 MG tablet Take 50 mg by mouth 2 (two) times daily.   Yes Historical Provider, MD  mirtazapine (REMERON) 15 MG tablet Take 7.5 mg by mouth at bedtime.    Yes Historical Provider, MD  mupirocin ointment (BACTROBAN) 2 % Apply 1 application topically 2 (two) times daily. Apply to open wounds on hips & thighs   Yes Historical Provider, MD  Oyster Shell (OYSTER CALCIUM) 500 MG TABS tablet Take 500 mg of elemental calcium by mouth 2 (two) times daily.   Yes Historical Provider, MD  pantoprazole (PROTONIX) 40 MG tablet Take 40 mg by mouth daily.   Yes Historical Provider, MD  polyethylene glycol (MIRALAX / GLYCOLAX) packet Take 17 g by mouth daily as needed for mild constipation.   Yes Historical Provider, MD  potassium chloride SA (K-DUR,KLOR-CON) 20 MEQ tablet Take 40 mEq by mouth daily.    Yes Historical Provider, MD  Rivaroxaban (XARELTO) 15 MG TABS tablet Take 15  mg by mouth daily with supper.   Yes Historical Provider, MD  torsemide (DEMADEX) 20 MG tablet Take 1 tablet (20 mg total) by mouth daily. 08/02/13  Yes Asencion Noble, MD  triamcinolone cream (KENALOG) 0.1 % Apply 1 application topically daily. Apply to lower extremities.   Yes Historical Provider, MD   Physical Exam: Filed Vitals:   08/09/13 0800  BP: 116/70  Pulse: 79  Temp:   Resp: 17    BP 116/70  Pulse 79  Temp(Src) 97.5 F (36.4 C) (Oral)  Resp 17  SpO2 96%  General:  Somewhat frail and chronically ill appearing no acute distress Eyes: EOMI, pupils equal round reactive to light ENT: grossly normal hearing, lips & tongue, oropharynx without erythema or exudate. Neck:  no LAD, masses or thyromegaly Cardiovascular: Irregularly irregular, +murmur, 2+ LE edema to thighs and lower abdomen  Respiratory: Mild increased work of breathing with conversation. Breath sounds are distant. Hear no wheeze or crackles Abdomen: Somewhat distended and firm positive bowel sounds are out nontender to palpation Skin: Open lesions/blisters to upper chest and arms bilaterally. Lower extremities with fluid-filled blisters in the setting of venous stasis changes. Musculoskeletal: grossly normal tone BUE/BLE Psychiatric: grossly normal mood and affect, speech fluent and appropriate Neurologic: grossly non-focal.          Labs on Admission:  Basic Metabolic Panel:  Recent Labs Lab 08/09/13 0807  NA 140  K 4.4  CL 97  CO2 29  GLUCOSE 118*  BUN 72*  CREATININE 2.67*  CALCIUM 9.8   Liver Function Tests:  Recent Labs Lab 08/09/13 0807  AST 16  ALT 9  ALKPHOS 85  BILITOT 0.5  PROT 7.3  ALBUMIN 3.2*    Recent Labs Lab 08/09/13 0807  LIPASE 32   No results found for this basename: AMMONIA,  in the last 168 hours CBC:  Recent Labs Lab 08/09/13 0807  WBC 4.2  NEUTROABS 2.7  HGB 9.8*  HCT 29.7*  MCV 102.8*  PLT 183   Cardiac Enzymes:  Recent Labs Lab 08/09/13 0807    TROPONINI <0.30    BNP (last 3 results)  Recent Labs  06/21/13 1620 07/27/13 1503 08/09/13 0807  PROBNP 9750.0* 12316.0* 10577.0*   CBG:  Recent Labs Lab 08/02/13 1154  GLUCAP 72    Radiological Exams on Admission: Dg Chest Port 1 View  08/09/2013   CLINICAL DATA:  Leg swelling.  EXAM: PORTABLE CHEST - 1 VIEW  COMPARISON:  July 27, 2013.  FINDINGS: Stable cardiomegaly. No pneumothorax is noted. No significant pleural effusion is noted. Linear density is noted in right lung base most consistent with subsegmental atelectasis. Mild central pulmonary vascular congestion is noted. This is improved compared to prior exam. Degenerative changes are seen involving the acromioclavicular joints bilaterally.  IMPRESSION: Stable cardiomegaly. Central pulmonary vascular congestion noted on prior exam is significantly improved. Minimal right apical subsegmental atelectasis is noted.   Electronically Signed   By: Sabino Dick M.D.   On: 08/09/2013 08:38    EKG: Independently reviewed Atrial fib. When compared to recent afib replaced wide QRS  Assessment/Plan Principal Problem:   UTI (lower urinary tract infection): In setting of chronic Foley cathetered related to chronic urinary retention. Given one dose of Cipro in the emergency department. Will obtain urine culture and sensitivities. Request Rocephin per pharmacy. She is currently afebrile and nontoxic appearing with no leukocytosis. Active Problems:  Acute on chronic diastolic heart failure: With increased weight gain and lower extremity edema however chest x-ray shows improvement and proBNP while elevated is less than it was on last admission. Patient takes 80 mg of Lasix twice a day with torsemide and outpatient. Will continue Lasix intravenously 80 mg twice a day. Will obtain daily weights and monitor strict intake and output. Will provide heart healthy low-sodium diet as well as 1200 mils fluid restriction. Recent echocardiogram reveals  ejection fraction of 60%. Will not repeat at this time. May benefit from cardiology consult  Acute on chronic kidney failure stage IV. Chart review indicates baseline creatinine around 2.2. Currently creatinine is 2.6. Will hold any nephrotoxins. Will monitor closely given need for IV Lasix.  Hypertension: Pressure somewhat soft in the emergency department. Will hold her metoprolol and Cardizem for now. Will monitor closely  and resume as indicated    Atrial fibrillation: Currently rate controlled. She is on Cardizem at home but we'll hold mouth for soft blood pressure. She is also on Xarelto. Will continue this.  Chronic respiratory failure: Appears stable at baseline in spite of #2. Oxygen saturation level 96% on 2 L nasal cannula.    Anemia of other chronic disease: macrocytic. Current hemoglobin 9.8 which appears to be slightly below her baseline of 10-11. No signs of active bleeding. Will monitor    Diabetes: Home medications include Lantus 12 units, we'll continue this at half the dose. Will obtain hemoglobin A1c.    Chronic indwelling Foley catheter: Related to chronic urinary retention. See #1   Code Status: DNR Family Communication: none present Disposition Plan: back to [facility] when ready likely 2-3 days  Time spent: 60 minutes  [facility] Pager [phone]

## 2013-08-09 NOTE — Progress Notes (Signed)
ANTIBIOTIC CONSULT NOTE - INITIAL  Pharmacy Consult for Rocephin Indication: UTI  Allergies  Allergen Reactions  . Amoxicillin-Pot Clavulanate Diarrhea   Patient Measurements: Height: 5\' 3"  (160 cm) Weight: 172 lb 3.2 oz (78.109 kg) IBW/kg (Calculated) : 52.4  Vital Signs: Temp: 97.6 F (36.4 C) (04/13 1120) Temp src: Oral (04/13 0741) BP: 120/82 mmHg (04/13 1120) Pulse Rate: 82 (04/13 1120) Intake/Output from previous day:   Intake/Output from this shift:    Labs:  Recent Labs  08/09/13 0807  WBC 4.2  HGB 9.8*  PLT 183  CREATININE 2.67*   Estimated Creatinine Clearance: 14.7 ml/min (by C-G formula based on Cr of 2.67). No results found for this basename: Letta Median, VANCORANDOM, GENTTROUGH, GENTPEAK, GENTRANDOM, TOBRATROUGH, TOBRAPEAK, TOBRARND, AMIKACINPEAK, AMIKACINTROU, AMIKACIN,  in the last 72 hours   Microbiology: Recent Results (from the past 720 hour(s))  URINE CULTURE     Status: None   Collection Time    07/27/13  2:55 PM      Result Value Ref Range Status   Specimen Description URINE, CATHETERIZED   Final   Special Requests NONE   Final   Culture  Setup Time     Final   Value: 07/26/2013 17:26     Performed at [facility]     Final   Value: NO GROWTH     Performed at [facility]   Culture     Final   Value: NO GROWTH     Performed at [facility]   Report Status 07/28/2013 FINAL   Final  MRSA PCR SCREENING     Status: None   Collection Time    07/27/13  6:30 PM      Result Value Ref Range Status   MRSA by PCR NEGATIVE  NEGATIVE Final   Comment:            The GeneXpert MRSA Assay (FDA     approved for NASAL specimens     only), is one component of a     comprehensive MRSA colonization     surveillance program. It is not     intended to diagnose MRSA     infection nor to guide or     monitor treatment for     MRSA infections.   Medical History: Past Medical History  Diagnosis Date   . CHF (congestive heart failure)   . Diabetes mellitus   . Hypertension   . COPD (chronic obstructive pulmonary disease)   . Vertigo   . Cancer     breast with mastectomy  . Stroke      patient denies  . Skin disorder      Epidermolysis bullosa.  . Pulmonary hypertension   . Atrial fibrillation 2011  . Multiple thyroid nodules   . Anemia   . Breast cancer   . Anemia of other chronic disease 06/09/2012  . Aortic stenosis   . Monoclonal gammopathy   . Hypothermia   . Diverticulosis   . Squamous cell carcinoma   . Osteoporosis, unspecified 02/24/2013  . Chronic kidney disease   . On home O2     2L N/C    Medications:  Prescriptions prior to admission  Medication Sig Dispense Refill  . acetaminophen (TYLENOL) 325 MG tablet Take 650 mg by mouth every 6 (six) hours as needed for mild pain, fever or headache.       Marland Kitchen atorvastatin (LIPITOR) 10 MG tablet Take 10 mg by  mouth at bedtime.      . calcitRIOL (ROCALTROL) 0.25 MCG capsule Take 1 capsule (0.25 mcg total) by mouth daily.  30 capsule  12  . cholecalciferol (VITAMIN D) 1000 UNITS tablet Take 2,000 Units by mouth daily.      . citalopram (CELEXA) 20 MG tablet Take 20 mg by mouth daily.      . clonazePAM (KLONOPIN) 0.5 MG tablet Take 0.5 mg by mouth 3 (three) times daily as needed for anxiety.      . cyanocobalamin (,VITAMIN B-12,) 1000 MCG/ML injection Inject 1,000 mcg into the muscle every 30 (thirty) days.      Marland Kitchen diltiazem (DILACOR XR) 240 MG 24 hr capsule Take 240 mg by mouth daily.      . ferrous sulfate 325 (65 FE) MG tablet Take 325 mg by mouth daily with breakfast.      . fluocinonide cream (LIDEX) 9.89 % Apply 1 application topically daily. Apply sparingly to itchy areas on trunk & extremeties      . furosemide (LASIX) 80 MG tablet Take 80 mg by mouth 2 (two) times daily.      . insulin glargine (LANTUS) 100 UNIT/ML injection Inject 12 Units into the skin daily.      . metoprolol (LOPRESSOR) 50 MG tablet Take 50 mg by  mouth 2 (two) times daily.      . mirtazapine (REMERON) 15 MG tablet Take 7.5 mg by mouth at bedtime.       . mupirocin ointment (BACTROBAN) 2 % Apply 1 application topically 2 (two) times daily. Apply to open wounds on hips & thighs      . Oyster Shell (OYSTER CALCIUM) 500 MG TABS tablet Take 500 mg of elemental calcium by mouth 2 (two) times daily.      . pantoprazole (PROTONIX) 40 MG tablet Take 40 mg by mouth daily.      . polyethylene glycol (MIRALAX / GLYCOLAX) packet Take 17 g by mouth daily as needed for mild constipation.      . potassium chloride SA (K-DUR,KLOR-CON) 20 MEQ tablet Take 40 mEq by mouth daily.       . Rivaroxaban (XARELTO) 15 MG TABS tablet Take 15 mg by mouth daily with supper.      . torsemide (DEMADEX) 20 MG tablet Take 1 tablet (20 mg total) by mouth daily.  30 tablet  12  . triamcinolone cream (KENALOG) 0.1 % Apply 1 application topically daily. Apply to lower extremities.       Assessment: 80yo female admitted via ED for SOB.  Initial w/u reveals UTI.  Asked to initiate Rocephin.  SCr is elevated on admission.   Estimated Creatinine Clearance: 14.7 ml/min (by C-G formula based on Cr of 2.67).  Goal of Therapy:  Eradicate infection.  Plan:   Rocephin 1gm IV q24hrs (renal adjustment not necessary)  Monitor labs and progress  Ena Dawley 08/09/2013,12:06 PM

## 2013-08-09 NOTE — ED Provider Notes (Signed)
CSN: [id number]     Arrival date & time 08/09/13  4098 History  This chart was scribed for Mervin Kung, MD by Zettie Pho, ED Scribe. This patient was seen in room APA04/APA04 and the patient's care was started at 7:40 AM.    Chief Complaint  Patient presents with  . Leg Swelling   Patient is a 80 y.o. female presenting with shortness of breath. The history is provided by the patient. No language interpreter was used.  Shortness of Breath Severity:  Mild Onset quality:  Gradual Timing:  Constant Progression:  Unchanged Chronicity:  Chronic Associated symptoms: rash   Associated symptoms: no abdominal pain, no chest pain, no fever, no headaches and no vomiting   Rash:    Quality: blistering     Severity:  Moderate   Onset quality:  Gradual   Timing:  Constant   Progression:  Unchanged Risk factors: hx of cancer and prolonged immobilization   Risk factors: no tobacco use    HPI Comments: Catherine [name] is a 80 y.o. Female with a history of CHF, chronic kidney disease, and epidermolysis bullosa who presents to the Emergency Department from [facility] facility complaining of fluid retention, especially to the bilateral lower legs, abdomen, and bilateral arms. Patient presents with an associated blister-like rash to the bilateral, lower legs. She reports associated shortness of breath. Patient states that these complaints are chronic in nature, but has been progressively worsening over the past few days. Patient has been seen here multiple times for similar complaints and has been admitted to the hospital for CHF, last encounter was on 07/27/2013, or about 2 weeks ago. She denies chest pain, abdominal pain, nausea, emesis, diarrhea, fever, dysuria, back pain, headache. Patient does not use anticoagulant medication. Patient also has a history of DM, HTN, COPD (patient is on 2L of oxygen at home), cancer (breast and squamous cell), stroke, atrial fibrillation, anemia, aortic stenosis,  and monoclonal gammopathy. Patient does not smoke.   Past Medical History  Diagnosis Date  . CHF (congestive heart failure)   . Diabetes mellitus   . Hypertension   . COPD (chronic obstructive pulmonary disease)   . Vertigo   . Cancer     breast with mastectomy  . Stroke      patient denies  . Skin disorder      Epidermolysis bullosa.  . Pulmonary hypertension   . Atrial fibrillation 2011  . Multiple thyroid nodules   . Anemia   . Breast cancer   . Anemia of other chronic disease 06/09/2012  . Aortic stenosis   . Monoclonal gammopathy   . Hypothermia   . Diverticulosis   . Squamous cell carcinoma   . Osteoporosis, unspecified 02/24/2013  . Chronic kidney disease   . On home O2     2L N/C   Past Surgical History  Procedure Laterality Date  . Cholecystectomy    . Hemorrhoid surgery    . Abdominal hysterectomy  1991    complete hysterectomy for cancer  . Mastectomy  2005    left breast, tamoxifin  . Goiter    . Hip fracture surgery  2010    right hip replacement  . Hip fracture surgery  2006    left hip replacement  . Colonoscopy  ?    remote  . Cataract extraction, bilateral     Family History  Problem Relation Age of Onset  . Colon cancer Neg Hx   . Heart attack Son  age 53s, suspected MI  . Diabetes Mother    History  Substance Use Topics  . Smoking status: Never Smoker   . Smokeless tobacco: Never Used  . Alcohol Use: No   OB History   Grav Para Term Preterm Abortions TAB SAB Ect Mult Living                 Review of Systems  Constitutional: Negative for fever.  Respiratory: Positive for shortness of breath.   Cardiovascular: Positive for leg swelling. Negative for chest pain.  Gastrointestinal: Positive for abdominal distention. Negative for nausea, vomiting, abdominal pain and diarrhea.  Genitourinary: Negative for dysuria.  Musculoskeletal: Negative for back pain.  Skin: Positive for rash.  Neurological: Negative for headaches.   Hematological: Does not bruise/bleed easily.  Psychiatric/Behavioral: Negative for confusion.    Allergies  Amoxicillin-pot clavulanate  Home Medications   Current Outpatient Rx  Name  Route  Sig  Dispense  Refill  . acetaminophen (TYLENOL) 325 MG tablet   Oral   Take 650 mg by mouth every 6 (six) hours as needed for mild pain, fever or headache.          Marland Kitchen atorvastatin (LIPITOR) 10 MG tablet   Oral   Take 10 mg by mouth at bedtime.         . calcitRIOL (ROCALTROL) 0.25 MCG capsule   Oral   Take 1 capsule (0.25 mcg total) by mouth daily.   30 capsule   12   . cholecalciferol (VITAMIN D) 1000 UNITS tablet   Oral   Take 2,000 Units by mouth daily.         . citalopram (CELEXA) 20 MG tablet   Oral   Take 20 mg by mouth daily.         . clonazePAM (KLONOPIN) 0.5 MG tablet   Oral   Take 0.5 mg by mouth 3 (three) times daily as needed for anxiety.         . cyanocobalamin (,VITAMIN B-12,) 1000 MCG/ML injection   Intramuscular   Inject 1,000 mcg into the muscle every 30 (thirty) days.         Marland Kitchen diltiazem (DILACOR XR) 240 MG 24 hr capsule   Oral   Take 240 mg by mouth daily.         . ferrous sulfate 325 (65 FE) MG tablet   Oral   Take 325 mg by mouth daily with breakfast.         . fluocinonide cream (LIDEX) 0.05 %   Topical   Apply 1 application topically daily. Apply sparingly to itchy areas on trunk & extremeties         . furosemide (LASIX) 80 MG tablet   Oral   Take 80 mg by mouth 2 (two) times daily.         . insulin glargine (LANTUS) 100 UNIT/ML injection   Subcutaneous   Inject 12 Units into the skin daily.         . metoprolol (LOPRESSOR) 50 MG tablet   Oral   Take 50 mg by mouth 2 (two) times daily.         . mirtazapine (REMERON) 15 MG tablet   Oral   Take 7.5 mg by mouth at bedtime.          . mupirocin ointment (BACTROBAN) 2 %   Topical   Apply 1 application topically 2 (two) times daily. Apply to open wounds on  hips & thighs         .  Oyster Shell (OYSTER CALCIUM) 500 MG TABS tablet   Oral   Take 500 mg of elemental calcium by mouth 2 (two) times daily.         . pantoprazole (PROTONIX) 40 MG tablet   Oral   Take 40 mg by mouth daily.         . polyethylene glycol (MIRALAX / GLYCOLAX) packet   Oral   Take 17 g by mouth daily as needed for mild constipation.         . potassium chloride SA (K-DUR,KLOR-CON) 20 MEQ tablet   Oral   Take 40 mEq by mouth daily.          . Rivaroxaban (XARELTO) 15 MG TABS tablet   Oral   Take 15 mg by mouth daily with supper.         . torsemide (DEMADEX) 20 MG tablet   Oral   Take 1 tablet (20 mg total) by mouth daily.   30 tablet   12   . triamcinolone cream (KENALOG) 0.1 %   Topical   Apply 1 application topically daily. Apply to lower extremities.          Triage Vitals: BP 119/63  Pulse 93  Temp(Src) 97.5 F (36.4 C) (Oral)  Resp 28  SpO2 94%  Physical Exam  Nursing note and vitals reviewed. Constitutional: She is oriented to person, place, and time. She appears well-developed and well-nourished. No distress.  HENT:  Head: Normocephalic and atraumatic.  Mouth/Throat: Oropharynx is clear and moist.  Eyes: Conjunctivae and EOM are normal.  Neck: Normal range of motion. Neck supple.  Cardiovascular: Normal rate and regular rhythm.  Exam reveals friction rub.   Murmur heard. Possible systolic murmur or rub, difficult to hear clearly.   Pulmonary/Chest: Effort normal and breath sounds normal. No respiratory distress.  Abdominal: Bowel sounds are normal. She exhibits distension. There is no tenderness.  Musculoskeletal: Normal range of motion. She exhibits edema.  Marked edema of bilateral lower legs.   Neurological: She is alert and oriented to person, place, and time. No cranial nerve deficit. She exhibits normal muscle tone. Coordination normal.  Skin: Skin is warm and dry.  Open blisters in several places; to bilateral,  lower legs and upper chest. Ecchymosis to upper arms.   Psychiatric: She has a normal mood and affect. Her behavior is normal.    ED Course  Procedures (including critical care time)  DIAGNOSTIC STUDIES: Oxygen Saturation is 94% on nasal cannulation, adequate by my interpretation.    COORDINATION OF CARE: 7:48 AM- Will order a chest x-ray, EKG, troponin, BNP, CMP, CBC, lipase, and UA. Will order IV fluids to manage symptoms. Discussed treatment plan with patient at bedside and patient verbalized agreement.   Results for orders placed during the hospital encounter of 08/09/13  TROPONIN I      Result Value Ref Range   Troponin I <0.30  <0.30 ng/mL  PRO B NATRIURETIC PEPTIDE      Result Value Ref Range   Pro B Natriuretic peptide (BNP) 10577.0 (*) 0 - 450 pg/mL  COMPREHENSIVE METABOLIC PANEL      Result Value Ref Range   Sodium 140  137 - 147 mEq/L   Potassium 4.4  3.7 - 5.3 mEq/L   Chloride 97  96 - 112 mEq/L   CO2 29  19 - 32 mEq/L   Glucose, Bld 118 (*) 70 - 99 mg/dL   BUN 72 (*) 6 - 23 mg/dL   Creatinine,  Ser 2.67 (*) 0.50 - 1.10 mg/dL   Calcium 9.8  8.4 - 10.5 mg/dL   Total Protein 7.3  6.0 - 8.3 g/dL   Albumin 3.2 (*) 3.5 - 5.2 g/dL   AST 16  0 - 37 U/L   ALT 9  0 - 35 U/L   Alkaline Phosphatase 85  39 - 117 U/L   Total Bilirubin 0.5  0.3 - 1.2 mg/dL   GFR calc non Af Amer 15 (*) >90 mL/min   GFR calc Af Amer 17 (*) >90 mL/min  CBC WITH DIFFERENTIAL      Result Value Ref Range   WBC 4.2  4.0 - 10.5 K/uL   RBC 2.89 (*) 3.87 - 5.11 MIL/uL   Hemoglobin 9.8 (*) 12.0 - 15.0 g/dL   HCT 29.7 (*) 36.0 - 46.0 %   MCV 102.8 (*) 78.0 - 100.0 fL   MCH 33.9  26.0 - 34.0 pg   MCHC 33.0  30.0 - 36.0 g/dL   RDW 15.6 (*) 11.5 - 15.5 %   Platelets 183  150 - 400 K/uL   Neutrophils Relative % 66  43 - 77 %   Neutro Abs 2.7  1.7 - 7.7 K/uL   Lymphocytes Relative 9 (*) 12 - 46 %   Lymphs Abs 0.4 (*) 0.7 - 4.0 K/uL   Monocytes Relative 17 (*) 3 - 12 %   Monocytes Absolute 0.7  0.1  - 1.0 K/uL   Eosinophils Relative 8 (*) 0 - 5 %   Eosinophils Absolute 0.3  0.0 - 0.7 K/uL   Basophils Relative 1  0 - 1 %   Basophils Absolute 0.0  0.0 - 0.1 K/uL  LIPASE, BLOOD      Result Value Ref Range   Lipase 32  11 - 59 U/L  URINALYSIS, ROUTINE W REFLEX MICROSCOPIC      Result Value Ref Range   Color, Urine YELLOW  YELLOW   APPearance TURBID (*) CLEAR   Specific Gravity, Urine 1.015  1.005 - 1.030   pH 5.0  5.0 - 8.0   Glucose, UA NEGATIVE  NEGATIVE mg/dL   Hgb urine dipstick LARGE (*) NEGATIVE   Bilirubin Urine NEGATIVE  NEGATIVE   Ketones, ur NEGATIVE  NEGATIVE mg/dL   Protein, ur NEGATIVE  NEGATIVE mg/dL   Urobilinogen, UA 0.2  0.0 - 1.0 mg/dL   Nitrite NEGATIVE  NEGATIVE   Leukocytes, UA MODERATE (*) NEGATIVE  URINE MICROSCOPIC-ADD ON      Result Value Ref Range   Squamous Epithelial / LPF FEW (*) RARE   WBC, UA TOO NUMEROUS TO COUNT  <3 WBC/hpf   RBC / HPF TOO NUMEROUS TO COUNT  <3 RBC/hpf   Bacteria, UA MANY (*) RARE   Dg Chest Port 1 View  08/09/2013   CLINICAL DATA:  Leg swelling.  EXAM: PORTABLE CHEST - 1 VIEW  COMPARISON:  July 27, 2013.  FINDINGS: Stable cardiomegaly. No pneumothorax is noted. No significant pleural effusion is noted. Linear density is noted in right lung base most consistent with subsegmental atelectasis. Mild central pulmonary vascular congestion is noted. This is improved compared to prior exam. Degenerative changes are seen involving the acromioclavicular joints bilaterally.  IMPRESSION: Stable cardiomegaly. Central pulmonary vascular congestion noted on prior exam is significantly improved. Minimal right apical subsegmental atelectasis is noted.   Electronically Signed   By: Sabino Dick M.D.   On: 08/09/2013 08:38     EKG Interpretation None  Date: 08/09/2013  Rate: 86  Rhythm: atrial fibrillation  QRS Axis: normal  Intervals: normal  ST/T Wave abnormalities: nonspecific T wave changes  Conduction Disutrbances:none  Narrative  Interpretation:   Old EKG Reviewed: none available Questionable atrial fibrillation versus just not picking up the but P waves do to small electrical amplitudes.     MDM   Final diagnoses:  Edema  UTI (lower urinary tract infection)   Patient with significant urinary tract infection. Patient Naval architect Dr. is Dr. Willey Blade. We'll discuss with the hospitalist for an overnight admission for some IV antibiotics. Nature patient gets better and not worse. Outpatient with no significant congestive heart failure today. Renal function is a little bit worse. She does have significant body edema with weight gain. Adjustment of her diuretics would be helpful 2. Troponin was negative. No significant electrolyte abnormalities no leukocytosis no significant anemia. As stated troponin was negative no evidence of acute cardiac event. EKG without acute changes.  I personally performed the services described in this documentation, which was scribed in my presence. The recorded information has been reviewed and is accurate.      Mervin Kung, MD 08/09/13 [id number]

## 2013-08-09 NOTE — ED Notes (Signed)
Sent here from [facility].  Pt states she is here for fluid in her legs.  Denies any pain or sob.

## 2013-08-10 ENCOUNTER — Encounter (HOSPITAL_COMMUNITY): Payer: Self-pay

## 2013-08-10 ENCOUNTER — Encounter (HOSPITAL_COMMUNITY): Payer: Self-pay | Admitting: Cardiology

## 2013-08-10 DIAGNOSIS — I059 Rheumatic mitral valve disease, unspecified: Secondary | ICD-10-CM

## 2013-08-10 LAB — GLUCOSE, CAPILLARY
GLUCOSE-CAPILLARY: 82 mg/dL (ref 70–99)
GLUCOSE-CAPILLARY: 82 mg/dL (ref 70–99)
GLUCOSE-CAPILLARY: 182 mg/dL — AB (ref 70–99)
Glucose-Capillary: 111 mg/dL — ABNORMAL HIGH (ref 70–99)

## 2013-08-10 LAB — CBC
HEMATOCRIT: 29.3 % — AB (ref 36.0–46.0)
HEMOGLOBIN: 9.6 g/dL — AB (ref 12.0–15.0)
MCH: 34 pg (ref 26.0–34.0)
MCHC: 32.8 g/dL (ref 30.0–36.0)
MCV: 103.9 fL — ABNORMAL HIGH (ref 78.0–100.0)
Platelets: 176 10*3/uL (ref 150–400)
RBC: 2.82 MIL/uL — ABNORMAL LOW (ref 3.87–5.11)
RDW: 15.6 % — ABNORMAL HIGH (ref 11.5–15.5)
WBC: 4.3 10*3/uL (ref 4.0–10.5)

## 2013-08-10 LAB — BASIC METABOLIC PANEL
BUN: 68 mg/dL — AB (ref 6–23)
CO2: 30 mEq/L (ref 19–32)
Calcium: 9.6 mg/dL (ref 8.4–10.5)
Chloride: 99 mEq/L (ref 96–112)
Creatinine, Ser: 2.53 mg/dL — ABNORMAL HIGH (ref 0.50–1.10)
GFR, EST AFRICAN AMERICAN: 19 mL/min — AB (ref >90)
GFR, EST NON AFRICAN AMERICAN: 16 mL/min — AB (ref >90)
Glucose, Bld: 98 mg/dL (ref 70–99)
POTASSIUM: 3.9 meq/L (ref 3.7–5.3)
Sodium: 142 mEq/L (ref 137–147)

## 2013-08-10 MED ORDER — METOPROLOL TARTRATE 25 MG PO TABS
12.5000 mg | ORAL_TABLET | Freq: Two times a day (BID) | ORAL | Status: DC
  Administered 2013-08-10 – 2013-08-14 (×7): 12.5 mg via ORAL
  Filled 2013-08-10 (×8): qty 1

## 2013-08-10 NOTE — Consult Note (Signed)
CARDIOLOGY CONSULT NOTE   Patient ID: JALAN HILDER MRN: [id number] DOB/AGE: 1924-01-20 80 y.o.  Admit Date: 08/09/2013 Referring Physician: Willey Blade, MD Primary Physician: Asencion Noble, MD Consulting Cardiologist: Rozann Lesches MD Primary Cardiologist: [name] MD Reason for Consultation: CHF   Clinical Summary Ms. Vipperman is an 80 y.o.female with multiple admissions for decompensated diastolic CHF presents with UTI and recurrent CHF, lower extremity edema, and shortness of breath. She is a resident of [facility] On admission she was found to have Pro-BNP of 10,577, with evidence of pulmonary vascular congestion on CXR, but significantly improved from recent admission one week ago.   She was treated with IV Cipro and IV fluids. Most recent echo completed 08/20/2013 demonstrated EF of 55%-60% with mild LVH. Moderate MV regurgitation with eccentric jet - could possibly be underestimated. She was sent home on po torsemide 20 mg daily on recent discharge. When last assessed in consultation by Dr. Harl Bowie, decision was made not to pursue aggressive evaluation including TEE or other invasive testing given her high risk and poor candidacy for surgery.  She has since diuresed very well on IV lasix and is breathing better She is still complaining of generalized weakness statingb" I am so tired. I am ready to go." Denies chest pain or worsening dyspnea. She had complaints of constipation, but now with stool softner has had diarrhea.      Allergies  Allergen Reactions  . Amoxicillin-Pot Clavulanate Diarrhea    Medications Scheduled Medications: . atorvastatin  10 mg Oral QHS  . calcitRIOL  0.25 mcg Oral Daily  . cefTRIAXone (ROCEPHIN)  IV  1 g Intravenous Q24H  . cholecalciferol  2,000 Units Oral Daily  . citalopram  20 mg Oral Daily  . ferrous sulfate  325 mg Oral Q breakfast  . fluocinonide cream  1 application Topical Daily  . furosemide  80 mg Intravenous BID  . insulin aspart   0-15 Units Subcutaneous TID WC  . insulin aspart  0-5 Units Subcutaneous QHS  . insulin glargine  6 Units Subcutaneous QHS  . metoprolol tartrate  12.5 mg Oral BID  . mirtazapine  7.5 mg Oral QHS  . mupirocin ointment  1 application Topical BID  . pantoprazole  40 mg Oral Daily  . potassium chloride SA  40 mEq Oral Daily  . Rivaroxaban  15 mg Oral Q supper  . sodium chloride  3 mL Intravenous Q12H  . sodium chloride  3 mL Intravenous Q12H  . triamcinolone cream  1 application Topical BID    PRN Medications: sodium chloride, acetaminophen, acetaminophen, clonazePAM, HYDROcodone-acetaminophen, ondansetron (ZOFRAN) IV, ondansetron, polyethylene glycol, sodium chloride   Past Medical History  Diagnosis Date  . Diastolic heart failure     LVEF 60%  . Type 2 diabetes mellitus   . Essential hypertension, benign   . COPD (chronic obstructive pulmonary disease)     Home O2  . Vertigo   . Breast cancer   . Stroke      Patient denies  . Skin disorder     Epidermolysis bullosa.  . Pulmonary hypertension   . Multiple thyroid nodules   . Anemia   . Aortic stenosis   . Monoclonal gammopathy   . Diverticulosis   . Squamous cell carcinoma   . Osteoporosis   . CKD (chronic kidney disease) stage 4, GFR 15-29 ml/min   . Atrial fibrillation   . Mitral regurgitation     Moderate with severe left atrial enlargement  Past Surgical History  Procedure Laterality Date  . Cholecystectomy    . Hemorrhoid surgery    . Abdominal hysterectomy  1991    Complete hysterectomy for cancer  . Mastectomy  2005    Left breast, tamoxifin  . Goiter    . Hip fracture surgery  2010    Right hip replacement  . Hip fracture surgery  2006    Left hip replacement  . Colonoscopy  ?  Marland Kitchen Cataract extraction, bilateral      Family History  Problem Relation Age of Onset  . Colon cancer Neg Hx   . Heart attack Son     Age 66s, suspected MI  . Diabetes Mother     Social History Ms. Sirico reports that  she has never smoked. She has never used smokeless tobacco. Ms. Cumber reports that she does not drink alcohol.  Review of Systems Otherwise reviewed and negative except as outlined.  Physical Examination Blood pressure 109/68, pulse 92, temperature 97 F (36.1 C), temperature source Oral, resp. rate 18, height 5\' 3"  (1.6 m), weight 179 lb (81.194 kg), SpO2 97.00%.  Intake/Output Summary (Last 24 hours) at 08/10/13 1206 Last data filed at 08/10/13 0855  Gross per 24 hour  Intake    470 ml  Output   3300 ml  Net  -2830 ml    Telemetry: NSR rate in the 70's.  GEN: Chronically ill-appearing, no distress to HEENT: Conjunctiva and lids normal, oropharynx clear. Neck: Supple, no elevated JVP or carotid bruits, no thyromegaly. Lungs: Clear to auscultation, decreased at the bases, nonlabored breathing at rest. Cardiac: Regular rate and rhythm, no S3, 2/6 apical systolic murmur, no pericardial rub. Abdomen: Soft, nontender, bowel sounds present, no guarding or rebound. Extremities: Mild chronic edema, distal pulses 2+. Skin: Warm and dry. Musculoskeletal: No kyphosis. Neuropsychiatric: Alert and oriented x3, affect grossly appropriate.   Prior Cardiac Testing/Procedures 1.Echocardiogram 07/28/2013 Study data: Technically adequate study. - Left ventricle: The cavity size was normal. Wall thickness was increased in a pattern of mild LVH. Systolic function was normal. The estimated ejection fraction was in the range of 55% to 60%. Wall motion was normal; there were no regional wall motion abnormalities. - Aortic valve: Mildly to moderately calcified annulus. Mildly thickened leaflets. Valve area: 1.55cm^2(VTI). - Mitral valve: Mildly calcified annulus. Moderately thickened leaflets . Moderate regurgitation. The regurgitant jet is eccentric and travels posteriorally along the left atrial wall, this may lead to underestimation of severity. The MR vena contracta is 0.5 cm . - Left  atrium: The atrium was severely dilated. - Right ventricle: The interventricular septum is flattened in diastolic suggestive of RV volume overload. The cavity size was severely dilated. Systolic function was mildly to moderately reduced. RV TAPSE is 1.3 cm. - Right atrium: The atrium was severely dilated. - Tricuspid valve: Severe regurgitation. The regurgitation is caused by tricuspid anular dilatation and poor leaflet coaptation. - Pulmonary arteries: Systolic pressure was moderately to severely increased. PA peak pressure: 3mm Hg (S). - Systemic veins: There is systolic hepatic vein flow reversal suggestive of severe TR. - Inferior vena cava: The vessel was dilated; the respirophasic diameter changes were blunted (< 50%); findings are consistent with elevated central venous pressure. - Pericardium, extracardiac: There is a large left pleural effusion.   Lab Results  Basic Metabolic Panel:  Recent Labs Lab 08/09/13 0807 08/10/13 0438  NA 140 142  K 4.4 3.9  CL 97 99  CO2 29 30  GLUCOSE 118* 98  BUN 72* 68*  CREATININE 2.67* 2.53*  CALCIUM 9.8 9.6    Liver Function Tests:  Recent Labs Lab 08/09/13 0807  AST 16  ALT 9  ALKPHOS 85  BILITOT 0.5  PROT 7.3  ALBUMIN 3.2*    CBC:  Recent Labs Lab 08/09/13 0807 08/10/13 0438  WBC 4.2 4.3  NEUTROABS 2.7  --   HGB 9.8* 9.6*  HCT 29.7* 29.3*  MCV 102.8* 103.9*  PLT 183 176    Cardiac Enzymes:  Recent Labs Lab 08/09/13 0807  TROPONINI <0.30    BNP: 10,577.0   Radiology: Dg Chest Port 1 View  08/09/2013   CLINICAL DATA:  Leg swelling.  EXAM: PORTABLE CHEST - 1 VIEW  COMPARISON:  July 27, 2013.  FINDINGS: Stable cardiomegaly. No pneumothorax is noted. No significant pleural effusion is noted. Linear density is noted in right lung base most consistent with subsegmental atelectasis. Mild central pulmonary vascular congestion is noted. This is improved compared to prior exam. Degenerative changes are  seen involving the acromioclavicular joints bilaterally.  IMPRESSION: Stable cardiomegaly. Central pulmonary vascular congestion noted on prior exam is significantly improved. Minimal right apical subsegmental atelectasis is noted.   Electronically Signed   By: Sabino Dick M.D.   On: 08/09/2013 08:38     ECG: Atrial fibrillation 86 bpm. Right bundle branch block Possible Lateral infarct , age undetermined   Impression and Recommendations  1. Acute on Chronic Diastolic CHF: She has had multiple admissions for same, most recently one week ago. She was sent home on torsemide 20 mg daily after IV diuretics most recent hospitalization. Weight on discharge 170 pounds. Weight on admission 179 pounds. Likely combination of diastolic heart failure, mitral regurgitation of significant degree, pulmonary hypertension, and atrial fibrillation. She is a poor candidate for surgery, overall high risk.  She is diuresing well on IV Lasix 80 mg twice a day. She will likely need higher dose of torsemide on discharge. Probably at least 40 mg by mouth daily. She will be continued to be treated medically. We will titrate up diuretic once he is euvolemic. Creatinine 2.5 today, with discharge baseline creatinine of 2.1.  She has an overall poor prognosis. She continues to become more debilitated, and easily decompensated. I discussed with her possibility of Hospice. She states she is ready to go, but is uncertain if she is willing to proceed with hospice at this time. This can be readdressed at discharge or on followup with Dr. Willey Blade.  2. Atrial Fibrillation: Heart rate is currently well-controlled. On assessment she was in normal rhythm, with review of telemetry confirming, she is having paroxysmal atrial fibrillation as well. She remains on Xarelto and metoprolol 50 mg twice a day. She is currently not on any rate reducing medications on review of her inpatient list. Heart rate is in the 80s to 90s at rest. Both  metoprolol and diltiazem have been held due to mild hypotension. Would restart low-dose metoprolol for heart rate control. Hold off on diltiazem for now.  3. Anemia: This is chronic for her. Hemoglobin on admission 9.8. This may be related to some mild volume overload. We will continue to monitor with diuresis.   Signed: Phill Myron. Purcell Nails NP [facility] 08/10/2013, 12:06 PM Co-Sign MD   Attending note:  Patient seen with Ms. Lawrence NP, records reviewed and above note modified. Ms. Roundy presents with recurrent heart failure symptoms, also UTI with indwelling Foley catheter. She is fairly debilitated, resident of [facility], has  been managed medically for heart failure that is a combination of diastolic dysfunction, mitral regurgitation, pulmonary hypertension, and atrial fibrillation. She is high risk and a poor surgical candidate, therefore additional invasive testing has not been pursued. Our plan is to convert back to IV Lasix, consider higher dose Demadex at discharge. Obviously sodium and fluid restriction are indicated as well. Renal dysfunction also inhibits management. Would not be unreasonable to consider discussions about Hospice.  Satira Sark, M.D., F.A.C.C.

## 2013-08-10 NOTE — Clinical Social Work Psychosocial (Signed)
Clinical Social Work Department BRIEF PSYCHOSOCIAL ASSESSMENT 08/10/2013  Patient:  Catherine [name], Catherine [name]     Account Number:  [id number]     Admit date:  08/24/2013  Clinical Social Worker:  Wyatt Haste  Date/Time:  08/10/2013 09:56 AM  Referred by:  CSW  Date Referred:  08/10/2013 Referred for  ALF Placement   Other Referral:   Interview type:  Patient Other interview type:    PSYCHOSOCIAL DATA Living Status:  FACILITY Admitted from facility:  [place] Level of care:  Assisted Living Primary support name:  Catherine [name] Primary support relationship to patient:  FAMILY Degree of support available:   supportive per pt    CURRENT CONCERNS Current Concerns  Post-Acute Placement   Other Concerns:    SOCIAL WORK ASSESSMENT / PLAN CSW met with pt at bedside. Pt alert and oriented and well known to CSW from previous admissions. She has been a resident at [facility] for about a year. Pt has step children, grandchildren, and great grandchildren who she states visit her every chance they can get. Her niece, Catherine [name] lives in [place] and is [place]. Pt was SOB at facility and they noted swelling as well as weight gain. She was admited with acute on chronic diastolic heart failure and UTI. Last hospitalization, pt's oxygen was increased to 3 liters and she has remained at 3 liters at [facility] since then. Pt indicates she is "tired" with recent hospitalizations related to CHF. She desires to be home so badly, but understands this is not feasible. Pt was not ready to discuss d/c plan as she said she has a long way to go before she leaves, but she agreed to inform CSW if plan changed from return to [facility]. Per Tammy at facility, pt requires assist with all ADLs, except eating which pt does independently. Okay for return at d/c.   Assessment/plan status:  Psychosocial Support/Ongoing Assessment of Needs Other assessment/ plan:   Information/referral to community resources:   [facility]     PATIENT'S/FAMILY'S RESPONSE TO PLAN OF CARE: Pt states she is feeling somewhat better today. Will notify CSW if there are any changes in d/c plan to return to [facility].       Clinical Social Work Department BRIEF PSYCHOSOCIAL ASSESSMENT 08/10/2013  Patient:  Catherine [name], Catherine [name]     Account Number:  [id number]     Admit date:  08/24/2013  Clinical Social Worker:  Beverly Sessions  Date/Time:  08/10/2013 09:56 AM  Referred by:  CSW  Date Referred:  08/10/2013 Referred for  ALF Placement   Other Referral:   Interview type:  Patient Other interview type:    PSYCHOSOCIAL DATA Living Status:  FACILITY Admitted from facility:  [place] Level of care:  Assisted Living Primary support name:  Catherine [name] Primary support relationship to patient:  FAMILY Degree of support available:   supportive per pt    CURRENT CONCERNS Current Concerns  Post-Acute Placement   Other Concerns:    SOCIAL WORK ASSESSMENT / PLAN CSW met with pt at bedside. Pt alert and oriented and well known to CSW from previous admissions. She has been a resident at [facility] for about a year. Pt has step children, grandchildren, and great grandchildren who she states visit her every chance they can get. Her niece, Catherine [name] lives in [place] and is [place]. Pt was SOB at facility and they noted swelling as well as weight gain. She was admited with acute on chronic diastolic heart failure and UTI. Last hospitalization,  pt's oxygen was increased to 3 liters and she has remained at 3 liters at [facility] since then. Pt indicates she is "tired" with recent hospitalizations related to CHF. She desires to be home so badly, but understands this is not feasible. Pt was not ready to discuss d/c plan as she said she has a long way to go before she leaves, but she agreed to inform CSW if plan changed from return to [facility]. Per Tammy at facility, pt requires assist with all ADLs, except eating which pt does independently.  Okay for return at d/c.   Assessment/plan status:  Psychosocial Support/Ongoing Assessment of Needs Other assessment/ plan:   Information/referral to community resources:   [facility]    PATIENT'S/FAMILY'S RESPONSE TO PLAN OF CARE: Pt states she is feeling somewhat better today. Will notify CSW if there are any changes in d/c plan to return to [facility].       [name], [place]

## 2013-08-10 NOTE — Care Management Note (Signed)
UR completed 

## 2013-08-11 LAB — BASIC METABOLIC PANEL
BUN: 62 mg/dL — ABNORMAL HIGH (ref 6–23)
CO2: 32 mEq/L (ref 19–32)
Calcium: 9 mg/dL (ref 8.4–10.5)
Chloride: 100 mEq/L (ref 96–112)
Creatinine, Ser: 2.32 mg/dL — ABNORMAL HIGH (ref 0.50–1.10)
GFR, EST AFRICAN AMERICAN: 21 mL/min — AB (ref >90)
GFR, EST NON AFRICAN AMERICAN: 18 mL/min — AB (ref >90)
GLUCOSE: 121 mg/dL — AB (ref 70–99)
POTASSIUM: 3.3 meq/L — AB (ref 3.7–5.3)
Sodium: 143 mEq/L (ref 137–147)

## 2013-08-11 LAB — GLUCOSE, CAPILLARY
GLUCOSE-CAPILLARY: 93 mg/dL (ref 70–99)
Glucose-Capillary: 104 mg/dL — ABNORMAL HIGH (ref 70–99)
Glucose-Capillary: 135 mg/dL — ABNORMAL HIGH (ref 70–99)
Glucose-Capillary: 138 mg/dL — ABNORMAL HIGH (ref 70–99)

## 2013-08-11 MED ORDER — DILTIAZEM HCL ER COATED BEADS 240 MG PO CP24
240.0000 mg | ORAL_CAPSULE | Freq: Every day | ORAL | Status: DC
Start: 2013-08-11 — End: 2013-08-14
  Administered 2013-08-11 – 2013-08-14 (×4): 240 mg via ORAL
  Filled 2013-08-11 (×4): qty 1

## 2013-08-11 MED ORDER — CIPROFLOXACIN HCL 250 MG PO TABS
250.0000 mg | ORAL_TABLET | Freq: Two times a day (BID) | ORAL | Status: DC
  Administered 2013-08-11 – 2013-08-14 (×7): 250 mg via ORAL
  Filled 2013-08-11 (×7): qty 1

## 2013-08-11 MED ORDER — POTASSIUM CHLORIDE CRYS ER 20 MEQ PO TBCR
20.0000 meq | EXTENDED_RELEASE_TABLET | Freq: Three times a day (TID) | ORAL | Status: AC
  Administered 2013-08-11 – 2013-08-12 (×5): 20 meq via ORAL
  Filled 2013-08-11 (×5): qty 1

## 2013-08-11 NOTE — Progress Notes (Signed)
NAME:  Catherine [name], Catherine [name]                   ACCOUNT NO.:  [id number]  MEDICAL RECORD NO.:  [id number]  LOCATION:  A316                          FACILITY:  [facility]  PHYSICIAN:  Paula Compton. Willey Blade, MD       DATE OF BIRTH:  [birth year]  DATE OF PROCEDURE:  08/10/2013 DATE OF DISCHARGE:                                PROGRESS NOTE   SUBJECTIVE:  [name] feels that the swelling in her extremities has decreased.  She has diuresed 3300 mL.  She has had no fever.  She remains on IV Rocephin for her UTI.  She has a chronic indwelling Foley. White counts have been normal.  OBJECTIVE:  VITAL SIGNS:  Temperature 97.2, pulse 101, blood pressure 97/67. LUNGS:  Basilar rales. HEART:  Irregular with a grade 2 systolic murmur. ABDOMEN: Distended but nontender. EXTREMITIES:  Slightly improved edema in upper and lower extremities.  IMPRESSION/PLAN: 1. Acute on chronic diastolic heart failure.  Continue IV Lasix.     Appreciate cardiology consult today. 2. Urinary tract infection.  Urine culture pending.  Continue     Rocephin.  White count is normal at 4.3. 3. Monoclonal gammopathy of undetermined significance .  Hemoglobin is     stable at 9.6. 4. Chronic kidney disease.  BUN and creatinine are 68 and 2.53 which     are slightly improved from initial readings.  Potassium is normal     at 3.9. 5. Atrial fibrillation.  Continue Xarelto.  Creatinine clearance 16. 6. Pulmonary hypertension.  Continue supplemental oxygen. 7. Diabetes.  Fasting glucose is 82.     Paula Compton. Willey Blade, MD     ROF/MEDQ  D:  08/10/2013  T:  08/11/2013  Job:  [id number]

## 2013-08-11 NOTE — Care Management Note (Signed)
    Page 1 of 1   08/11/2013     1:06:00 PM   CARE MANAGEMENT NOTE 08/11/2013  Patient:  ANISIA, LEIJA   Account Number:  [id number]  Date Initiated:  08/11/2013  Documentation initiated by:  Theophilus Kinds  Subjective/Objective Assessment:   Pt admitted from [facility] with CHF. Pt will return to facility when medically stable.     Action/Plan:   CSW to arrange discharge to facility when medically stable. CM will arrange [facility] PT at discharge.   Anticipated DC Date:  08/14/2013   Anticipated DC Plan:  ASSISTED LIVING / REST HOME  In-house referral  Clinical Social Worker      DC Planning Services  CM consult      Choice offered to / List presented to:             Status of service:  Completed, signed off Medicare Important Message given?   (If response is "NO", the following Medicare IM given date fields will be blank) Date Medicare IM given:   Date Additional Medicare IM given:    Discharge Disposition:  ASSISTED LIVING  Per UR Regulation:    If discussed at Long Length of Stay Meetings, dates discussed:    Comments:  08/11/13 [place], RN BSN CM

## 2013-08-11 NOTE — Progress Notes (Signed)
Primary cardiologist: Dr. [name] Consulting cardiologist: Dr. Satira Sark  Subjective:   States that she does feel better this morning in terms of her breathing and abdominal fullness.   Objective:   Temp:  [97.2 F (36.2 C)-98 F (36.7 C)] 97.6 F (36.4 C) (04/15 0412) Pulse Rate:  [101-113] 104 (04/15 0412) Resp:  [18] 18 (04/15 0412) BP: (97-124)/(67-83) 113/73 mmHg (04/15 0412) SpO2:  [90 %-93 %] 90 % (04/15 0720) Weight:  [173 lb 11.2 oz (78.79 kg)] 173 lb 11.2 oz (78.79 kg) (04/15 0412) Last BM Date: 08/09/13  Filed Weights   08/09/13 1153 08/10/13 0512 08/11/13 0412  Weight: 172 lb 3.2 oz (78.109 kg) 179 lb (81.194 kg) 173 lb 11.2 oz (78.79 kg)    Intake/Output Summary (Last 24 hours) at 08/11/13 0858 Last data filed at 08/11/13 0419  Gross per 24 hour  Intake    460 ml  Output   4000 ml  Net  -3540 ml    Telemetry: Atrial fibrillation/tachycardia.  Exam:  General: Chronically ill-appearing, no distress.  Lungs: Decreased breath sounds at the bases, nonlabored breathing.  Cardiac: Irregularly irregular.  Abdomen: Decreased distention.  Extremities: Chronic appearing edema.   Lab Results:  Basic Metabolic Panel:  Recent Labs Lab 08/09/13 0807 08/10/13 0438 08/11/13 0533  NA 140 142 143  K 4.4 3.9 3.3*  CL 97 99 100  CO2 29 30 32  GLUCOSE 118* 98 121*  BUN 72* 68* 62*  CREATININE 2.67* 2.53* 2.32*  CALCIUM 9.8 9.6 9.0    Liver Function Tests:  Recent Labs Lab 08/09/13 0807  AST 16  ALT 9  ALKPHOS 85  BILITOT 0.5  PROT 7.3  ALBUMIN 3.2*    CBC:  Recent Labs Lab 08/09/13 0807 08/10/13 0438  WBC 4.2 4.3  HGB 9.8* 9.6*  HCT 29.7* 29.3*  MCV 102.8* 103.9*  PLT 183 176    Cardiac Enzymes:  Recent Labs Lab 08/09/13 0807  TROPONINI <0.30    BNP:  Recent Labs  06/21/13 1620 07/27/13 1503 08/09/13 0807  PROBNP 9750.0* 12316.0* 10577.0*     Medications:   Scheduled Medications: .  atorvastatin  10 mg Oral QHS  . calcitRIOL  0.25 mcg Oral Daily  . cefTRIAXone (ROCEPHIN)  IV  1 g Intravenous Q24H  . cholecalciferol  2,000 Units Oral Daily  . citalopram  20 mg Oral Daily  . ferrous sulfate  325 mg Oral Q breakfast  . fluocinonide cream  1 application Topical Daily  . furosemide  80 mg Intravenous BID  . insulin aspart  0-15 Units Subcutaneous TID WC  . insulin aspart  0-5 Units Subcutaneous QHS  . insulin glargine  6 Units Subcutaneous QHS  . metoprolol tartrate  12.5 mg Oral BID  . mirtazapine  7.5 mg Oral QHS  . mupirocin ointment  1 application Topical BID  . pantoprazole  40 mg Oral Daily  . potassium chloride SA  40 mEq Oral Daily  . Rivaroxaban  15 mg Oral Q supper  . sodium chloride  3 mL Intravenous Q12H  . sodium chloride  3 mL Intravenous Q12H  . triamcinolone cream  1 application Topical BID      PRN Medications:  sodium chloride, acetaminophen, acetaminophen, clonazePAM, HYDROcodone-acetaminophen, ondansetron (ZOFRAN) IV, ondansetron, polyethylene glycol, sodium chloride   Assessment:   1. Acute on chronic diastolic heart failure complicated by mitral regurgitation with pulmonary hypertension, also atrial fibrillation. Diuresing well on intravenous Lasix.  2. At least  moderate, eccentric mitral regurgitation with severe left atrial enlargement.  3. Atrial fibrillation.  4. Oxygen-dependent COPD.  5. Chronic kidney disease, stage IV, creatinine 2.3.   Plan/Discussion:    Discussed case with Dr. Willey Blade. Plan to continue IV Lasix with good diuresis. Renal function has been stable. Anticipate conservative management otherwise. Probably need higher dose Demadex at discharge. Consideration for Hospice would be reasonable.   Satira Sark, M.D., F.A.C.C.

## 2013-08-11 NOTE — Evaluation (Signed)
Physical Therapy Evaluation Patient Details Name: Catherine [name] MRN: [id number] DOB: [birth year] Today's Date: 08/11/2013   History of Present Illness  Catherine [name] is a 80 y.o. female a history of diastolic congestive heart failure, diabetes, A. fib, hypertension presents to the emergency department with a 3 to four-day history of worsening lower extremity edema and weight gain progressing to shortness of breath yesterday. Initial workup in the emergency room reveals a urinary tract infection and acute on chronic diastolic heart failure with acute on chronic renal failure.  Clinical Impression  Pt is from [facility] usually has assist to bathroom.    Follow Up Recommendations Home health PT    Equipment Recommendations  None recommended by PT    Recommendations for Other Services   none    Precautions / Restrictions Precautions Precautions: Fall Restrictions Weight Bearing Restrictions: No      Mobility  Bed Mobility Overal bed mobility: Needs Assistance Bed Mobility: Supine to Sit     Supine to sit: Min assist        Transfers Overall transfer level: Needs assistance Equipment used: Rolling walker (2 wheeled) Transfers: Squat Pivot Transfers              Ambulation/Gait Ambulation/Gait assistance: Min assist Ambulation Distance (Feet): 4 Feet Assistive device: Rolling walker (2 wheeled)     Gait velocity interpretation: Below normal speed for age/gender               Pertinent Vitals/Pain None noted    Home Living Family/patient expects to be discharged to:: Assisted living               Home Equipment: Walker - 2 wheels;Wheelchair - manual      Prior Function Level of Independence: Needs assistance   Gait / Transfers Assistance Needed: ambulates with a walker since she has been at [facility] she has assist getting to the bathroom and back.  ADL's / Homemaking Assistance Needed: assistance with bathing and all household ADLs  Comments:  for dressing and grooming provided by [employer]        Extremity/Trunk Assessment               Lower Extremity Assessment: Overall WFL for tasks assessed         Communication   Communication: HOH  Cognition Arousal/Alertness: Awake/alert   Overall Cognitive Status: Within Functional Limits for tasks assessed                         Exercises General Exercises - Lower Extremity Ankle Circles/Pumps: Both;10 reps Quad Sets: Both;10 reps Gluteal Sets: Both;10 reps Heel Slides: Both;10 reps Hip ABduction/ADduction: Both;10 reps      Assessment/Plan    PT Assessment Patient needs continued PT services  PT Diagnosis Difficulty walking;Generalized weakness   PT Problem List Decreased activity tolerance;Decreased mobility  PT Treatment Interventions Gait training;Therapeutic exercise   PT Goals (Current goals can be found in the Care Plan section) Acute Rehab PT Goals Patient Stated Goal: swelling to go down Time For Goal Achievement: 08/13/13 Potential to Achieve Goals: Good    Frequency Min 3X/week           End of Session Equipment Utilized During Treatment: Gait belt Activity Tolerance: Patient tolerated treatment well Patient left: in chair;with chair alarm set;with call bell/phone within reach           Time: 0850-0935 PT Time Calculation (min): 45 min   Charges:  PT Evaluation $Initial PT Evaluation Tier I: 1 Procedure     PT G Codes:          Leeroy Cha 08/11/2013, 9:38 AM

## 2013-08-12 LAB — GLUCOSE, CAPILLARY
GLUCOSE-CAPILLARY: 139 mg/dL — AB (ref 70–99)
GLUCOSE-CAPILLARY: 145 mg/dL — AB (ref 70–99)
Glucose-Capillary: 143 mg/dL — ABNORMAL HIGH (ref 70–99)
Glucose-Capillary: 185 mg/dL — ABNORMAL HIGH (ref 70–99)

## 2013-08-12 LAB — URINE CULTURE: Colony Count: >=100000

## 2013-08-12 MED ORDER — TORSEMIDE 20 MG PO TABS
40.0000 mg | ORAL_TABLET | Freq: Every day | ORAL | Status: DC
  Administered 2013-08-12 – 2013-08-14 (×3): 40 mg via ORAL
  Filled 2013-08-12 (×3): qty 2

## 2013-08-12 NOTE — Progress Notes (Signed)
Consulting cardiologist: Rozann Lesches MD Primary Cardiologist: [name] MD  Subjective:   Feeling some better, breathing better. Less swelling.   Objective:   Temp:  [97.3 F (36.3 C)-97.9 F (36.6 C)] 97.4 F (36.3 C) (04/16 0446) Pulse Rate:  [67-89] 67 (04/16 0446) Resp:  [18] 18 (04/16 0446) BP: (90-108)/(45-66) 108/66 mmHg (04/16 0825) SpO2:  [90 %-94 %] 90 % (04/16 0446) Weight:  [168 lb 8 oz (76.431 kg)] 168 lb 8 oz (76.431 kg) (04/16 0446) Last BM Date: 08/09/13  Filed Weights   08/10/13 0512 08/11/13 0412 08/12/13 0446  Weight: 179 lb (81.194 kg) 173 lb 11.2 oz (78.79 kg) 168 lb 8 oz (76.431 kg)    Intake/Output Summary (Last 24 hours) at 08/12/13 0918 Last data filed at 08/12/13 0451  Gross per 24 hour  Intake    240 ml  Output   2100 ml  Net  -1860 ml    Telemetry: Atrial fibrillation.  Exam:  General: No acute distress.  Lungs: Clear to auscultation, diminished in the bases.  Cardiac: IRRR, 2/6 systolic murmur  Extremities: 1+ pitting edema, distal pulses full.   Neuropsychiatric: Alert and oriented x3, affect appropriate. [facility]   Lab Results:  Basic Metabolic Panel:  Recent Labs Lab 08/09/13 0807 08/10/13 0438 08/11/13 0533  NA 140 142 143  K 4.4 3.9 3.3*  CL 97 99 100  CO2 29 30 32  GLUCOSE 118* 98 121*  BUN 72* 68* 62*  CREATININE 2.67* 2.53* 2.32*  CALCIUM 9.8 9.6 9.0    Liver Function Tests:  Recent Labs Lab 08/09/13 0807  AST 16  ALT 9  ALKPHOS 85  BILITOT 0.5  PROT 7.3  ALBUMIN 3.2*    CBC:  Recent Labs Lab 08/09/13 0807 08/10/13 0438  WBC 4.2 4.3  HGB 9.8* 9.6*  HCT 29.7* 29.3*  MCV 102.8* 103.9*  PLT 183 176     Medications:   Scheduled Medications: . atorvastatin  10 mg Oral QHS  . calcitRIOL  0.25 mcg Oral Daily  . cholecalciferol  2,000 Units Oral Daily  . ciprofloxacin  250 mg Oral BID  . citalopram  20 mg Oral Daily  . diltiazem  240 mg Oral Daily  . ferrous sulfate  325 mg  Oral Q breakfast  . fluocinonide cream  1 application Topical Daily  . furosemide  80 mg Intravenous BID  . insulin aspart  0-15 Units Subcutaneous TID WC  . insulin aspart  0-5 Units Subcutaneous QHS  . insulin glargine  6 Units Subcutaneous QHS  . metoprolol tartrate  12.5 mg Oral BID  . mirtazapine  7.5 mg Oral QHS  . mupirocin ointment  1 application Topical BID  . pantoprazole  40 mg Oral Daily  . potassium chloride  20 mEq Oral TID  . Rivaroxaban  15 mg Oral Q supper  . sodium chloride  3 mL Intravenous Q12H  . sodium chloride  3 mL Intravenous Q12H  . triamcinolone cream  1 application Topical BID     PRN Medications: sodium chloride, acetaminophen, acetaminophen, clonazePAM, HYDROcodone-acetaminophen, ondansetron (ZOFRAN) IV, ondansetron, polyethylene glycol, sodium chloride   Assessment and Plan:   1. Acute on Chronic Diastolic CHF:  She is diuresing very well. Wt is down 9 lbs since highest admission wt. She remains on IV lasix and will now be transitioned to PO torsemide 40 mg daily. Creatinine is 2.32 currently. Will repeat BMET in am.  2. Atrial fibrillation: Rate is controlled currently.  3. CKD: Stage IV: Monitor her function. Will likely deteriorate some with increased dose of torsemide, but may be necessary to avoid fluid overload. Overall poor prognosis.    Phill Myron. Purcell Nails NP [facility] 08/12/2013, 9:18 AM   Attending note:  Patient seen and examined. Modified above note by Ms. Lawrence NP. Ms. Lenig continues to show good diuresis on IV Lasix, feels better clinically. Renal function has been stable, actually creatinine decreasing somewhat. We are going to try and place her on oral Demadex at 40 mg daily while she is in the hospital to observe the diuretic response on this dose, then decide what the best dose would be for discharge.  Satira Sark, M.D., F.A.C.C.

## 2013-08-12 NOTE — Clinical Social Work Note (Signed)
CSW spoke with MD regarding pt. Pt is progressing, but will likely need 1-2 more days. Updated [facility] who remain willing to accept pt when stable.  [name], [place]

## 2013-08-12 NOTE — Progress Notes (Signed)
NAME:  Catherine [name], Catherine [name]                   ACCOUNT NO.:  [id number]  MEDICAL RECORD NO.:  [id number]  LOCATION:  A316                          FACILITY:  [facility]  PHYSICIAN:  Paula Compton. Willey Blade, MD       DATE OF BIRTH:  1923-06-13  DATE OF PROCEDURE:  08/11/2013 DATE OF DISCHARGE:                                PROGRESS NOTE   SUBJECTIVE:  Ms. Khurana denies any increased shortness of breath.  Her swelling in her arms and legs has improved.  She has diuresed another 2000 mL.  Her weight is 173 pounds.  OBJECTIVE:  VITAL SIGNS:  Temperature 97.9, pulse 73, respirations 18, blood pressure 94/45, oxygen saturation 92% on supplemental oxygen at 2 L on nasal cannula. LUNGS:  Reveal basilar rales. HEART:  Irregular with a grade 2 systolic murmur. ABDOMEN:  Nontender.  Distention unchanged. EXTREMITIES:  Improved edema in the arms and legs.  IMPRESSION/PLAN: 1. Acute on chronic diastolic heart failure.  Continue IV Lasix.     Discussed with Cardiology. 2. Hypokalemia.  Potassium is 3.3.  Increase potassium     supplementation. 3. Pulmonary hypertension. 4. Chronic kidney disease.  BUN and creatinine are slightly improved     at 62 and 2.32.  Bicarb is 32. 5. Diabetes, glucose is 121. 6. Atrial fibrillation.  Diltiazem was re-added this morning.  Her     heart rate has improved from the low 100s to the 70s with diltiazem     and metoprolol.  Continue Xarelto.  GFR is 18. 7. Urinary tract infection.  Urine culture reveals greater than     100,000 colonies of Pseudomonas.  Stop Rocephin.  Start Cipro.     Sensitivities pending.  Foley catheter remains in place.     Paula Compton. Willey Blade, MD     ROF/MEDQ  D:  08/11/2013  T:  08/12/2013  Job:  [id number]

## 2013-08-12 NOTE — Progress Notes (Signed)
Physical Therapy Treatment Patient Details Name: Catherine [name] MRN: [id number] DOB: 1923/09/25 Today's Date: 08/12/2013    History of Present Illness      PT Comments    Pt progressing well towards goals.  Pt sitting in chair upon therapist entrance.  Pt able to complete all therex with noted fatigue following rep 8 with all exercises.  Pt able to verbalize and demonstrate safe mechanics with sit to stands with min guard.  Gait training complete with min guard x 32 feet with RW.  Pt left in chair with call bell within reach.   Follow Up Recommendations        Equipment Recommendations       Recommendations for Other Services       Precautions / Restrictions Precautions Precautions: Fall Restrictions Weight Bearing Restrictions: No    Mobility  Bed Mobility                  Transfers Overall transfer level: Modified independent Equipment used: Rolling walker (2 wheeled) Transfers: Sit to/from Stand Sit to Stand: Min guard         General transfer comment: Pt able to perform and verbalize safe mechanics with handplacement for sit<>stand  Ambulation/Gait Ambulation/Gait assistance: Min assist;Min guard Ambulation Distance (Feet): 32 Feet Assistive device: Rolling walker (2 wheeled) Gait Pattern/deviations: Decreased stance time - left;Trunk flexed   Gait velocity interpretation: Below normal speed for age/gender     Stairs            Wheelchair Mobility    Modified Rankin (Stroke Patients Only)       Balance                                    Cognition Arousal/Alertness: Awake/alert Behavior During Therapy: WFL for tasks assessed/performed Overall Cognitive Status: Within Functional Limits for tasks assessed                      Exercises General Exercises - Lower Extremity Ankle Circles/Pumps: Both;10 reps Quad Sets: Both;10 reps Gluteal Sets: Both;10 reps Long Arc Quad: AROM;Both;10 reps Heel Slides: Both;10  reps Hip ABduction/ADduction: Both;10 reps Hip Flexion/Marching: AROM;Both;5 reps;Seated    General Comments        Pertinent Vitals/Pain No complaint of pain through session.    Home Living                      Prior Function            PT Goals (current goals can now be found in the care plan section) Progress towards PT goals: Progressing toward goals    Frequency       PT Plan Current plan remains appropriate    Co-evaluation             End of Session Equipment Utilized During Treatment: Gait belt Activity Tolerance: Patient tolerated treatment well Patient left: in chair;with chair alarm set;with call bell/phone within reach     Time: 1515-1552 PT Time Calculation (min): 37 min  Charges:  $Gait Training: 8-22 mins $Therapeutic Exercise: 8-22 mins $Therapeutic Activity: 8-22 mins                    G Codes:      Aldona Lento 08/12/2013, 3:55 PM

## 2013-08-12 NOTE — Progress Notes (Signed)
NAME:  WILHELMENA, ZEA                   ACCOUNT NO.:  [id number]  MEDICAL RECORD NO.:  [id number]  LOCATION:  A316                          FACILITY:  [facility]  PHYSICIAN:  Paula Compton. Willey Blade, MD       DATE OF BIRTH:  1924/02/13  DATE OF PROCEDURE:  08/12/2013 DATE OF DISCHARGE:                                PROGRESS NOTE   SUBJECTIVE:  Mrs. Boisselle is feeling better.  She is breathing comfortably. She diuresed 3100 mL.  Her weight is recorded at 168.  OBJECTIVE:  VITAL SIGNS:  Temperature 97.4, pulse 67, blood pressure 90/57. LUNGS:  Reveal basilar rales. HEART:  Irregularly irregular with a grade 2 systolic murmur. ABDOMEN:  Less distended. EXTREMITIES:  Reveal less edema in the arms and legs.  IMPRESSION/PLAN: 1. Congestive heart failure, continue IV Lasix. 2. Chronic kidney disease, recheck metabolic profile tomorrow. 3. Hypokalemia, continue potassium supplements. 4. Pulmonary hypertension. 5. Mitral regurgitation. 6. Monoclonal gammopathy of undetermined significance. 7. Urinary tract infection.  Culture reveals Pseudomonas sensitive to     Cipro.  Continue oral Cipro. 8. Diabetes.  Glucose is yesterday ranged from 93-138.  This morning's     Accu-Chek is pending.     Paula Compton. Willey Blade, MD     ROF/MEDQ  D:  08/12/2013  T:  08/12/2013  Job:  [id number]

## 2013-08-13 LAB — BASIC METABOLIC PANEL
BUN: 65 mg/dL — ABNORMAL HIGH (ref 6–23)
CO2: 31 mEq/L (ref 19–32)
CREATININE: 2.06 mg/dL — AB (ref 0.50–1.10)
Calcium: 8.9 mg/dL (ref 8.4–10.5)
Chloride: 98 mEq/L (ref 96–112)
GFR, EST AFRICAN AMERICAN: 24 mL/min — AB (ref >90)
GFR, EST NON AFRICAN AMERICAN: 21 mL/min — AB (ref >90)
Glucose, Bld: 124 mg/dL — ABNORMAL HIGH (ref 70–99)
Potassium: 3.8 mEq/L (ref 3.7–5.3)
Sodium: 142 mEq/L (ref 137–147)

## 2013-08-13 LAB — GLUCOSE, CAPILLARY
GLUCOSE-CAPILLARY: 119 mg/dL — AB (ref 70–99)
GLUCOSE-CAPILLARY: 149 mg/dL — AB (ref 70–99)
Glucose-Capillary: 104 mg/dL — ABNORMAL HIGH (ref 70–99)

## 2013-08-13 MED ORDER — POTASSIUM CHLORIDE CRYS ER 20 MEQ PO TBCR
20.0000 meq | EXTENDED_RELEASE_TABLET | Freq: Three times a day (TID) | ORAL | Status: DC
  Administered 2013-08-13 – 2013-08-14 (×4): 20 meq via ORAL
  Filled 2013-08-13 (×4): qty 1

## 2013-08-13 NOTE — Progress Notes (Signed)
Primary cardiologist: Dr. [name] Consulting cardiologist: Dr. Satira Sark  Subjective:   Up in chair today, states she feels better.   Objective:   Temp:  [97.6 F (36.4 C)-98.3 F (36.8 C)] 98.3 F (36.8 C) (04/17 0418) Pulse Rate:  [76-89] 78 (04/17 0418) Resp:  [18-20] 20 (04/17 0418) BP: (100-114)/(55-72) 114/72 mmHg (04/17 1034) SpO2:  [94 %-96 %] 96 % (04/17 0418) Weight:  [169 lb 3.2 oz (76.749 kg)] 169 lb 3.2 oz (76.749 kg) (04/17 0500) Last BM Date: 08/10/13  Filed Weights   08/11/13 0412 08/12/13 0446 08/13/13 0500  Weight: 173 lb 11.2 oz (78.79 kg) 168 lb 8 oz (76.431 kg) 169 lb 3.2 oz (76.749 kg)    Intake/Output Summary (Last 24 hours) at 08/13/13 1143 Last data filed at 08/13/13 0917  Gross per 24 hour  Intake    200 ml  Output    900 ml  Net   -700 ml    Telemetry: Atrial fibrillation.  Exam:  General: No distress.  Lungs: Decreased breath sounds the bases, otherwise clear.  Cardiac: Irregularly irregular, 2/6 systolic murmur.  Extremities: Mild edema.   Lab Results:  Basic Metabolic Panel:  Recent Labs Lab 08/10/13 0438 08/11/13 0533 08/13/13 0520  NA 142 143 142  K 3.9 3.3* 3.8  CL 99 100 98  CO2 30 32 31  GLUCOSE 98 121* 124*  BUN 68* 62* 65*  CREATININE 2.53* 2.32* 2.06*  CALCIUM 9.6 9.0 8.9    Liver Function Tests:  Recent Labs Lab 08/09/13 0807  AST 16  ALT 9  ALKPHOS 85  BILITOT 0.5  PROT 7.3  ALBUMIN 3.2*    CBC:  Recent Labs Lab 08/09/13 0807 08/10/13 0438  WBC 4.2 4.3  HGB 9.8* 9.6*  HCT 29.7* 29.3*  MCV 102.8* 103.9*  PLT 183 176    Cardiac Enzymes:  Recent Labs Lab 08/09/13 0807  TROPONINI <0.30     Medications:   Scheduled Medications: . atorvastatin  10 mg Oral QHS  . calcitRIOL  0.25 mcg Oral Daily  . cholecalciferol  2,000 Units Oral Daily  . ciprofloxacin  250 mg Oral BID  . citalopram  20 mg Oral Daily  . diltiazem  240 mg Oral Daily  . ferrous sulfate   325 mg Oral Q breakfast  . fluocinonide cream  1 application Topical Daily  . insulin aspart  0-15 Units Subcutaneous TID WC  . insulin aspart  0-5 Units Subcutaneous QHS  . insulin glargine  6 Units Subcutaneous QHS  . metoprolol tartrate  12.5 mg Oral BID  . mirtazapine  7.5 mg Oral QHS  . mupirocin ointment  1 application Topical BID  . pantoprazole  40 mg Oral Daily  . potassium chloride  20 mEq Oral TID  . Rivaroxaban  15 mg Oral Q supper  . sodium chloride  3 mL Intravenous Q12H  . sodium chloride  3 mL Intravenous Q12H  . torsemide  40 mg Oral Daily  . triamcinolone cream  1 application Topical BID      PRN Medications:  sodium chloride, acetaminophen, acetaminophen, clonazePAM, HYDROcodone-acetaminophen, ondansetron (ZOFRAN) IV, ondansetron, polyethylene glycol, sodium chloride   Assessment:   1. Chronic diastolic heart failure complicated by mitral regurgitation with pulmonary hypertension, also atrial fibrillation. She is continuing to diurese following conversion to [facility], however not as vigorously. Renal function has tolerated this, in fact creatinine has improved.  2. At least moderate, eccentric mitral regurgitation with severe left atrial  enlargement.   3. Atrial fibrillation.   4. Oxygen-dependent COPD.   5. Chronic kidney disease, stage IV, creatinine 2.3.   Plan/Discussion:    Recommend continuing Demadex at 40 mg daily for discharge, she might be able to tolerate more if necessary, however do not want to overly diurese. She will need close followup after discharge, ideally to see Dr. Bronson Ing within one to 2 weeks.   Satira Sark, M.D., F.A.C.C.

## 2013-08-13 NOTE — Care Management Utilization Note (Signed)
UR completed 

## 2013-08-13 NOTE — Progress Notes (Signed)
NAME:  Catherine [name], Catherine [name]                   ACCOUNT NO.:  [id number]  MEDICAL RECORD NO.:  [id number]  LOCATION:  A316                          FACILITY:  [facility]  PHYSICIAN:  Paula Compton. Willey Blade, MD       DATE OF BIRTH:  1924/02/16  DATE OF PROCEDURE:  08/13/2013 DATE OF DISCHARGE:                                PROGRESS NOTE   SUBJECTIVE:  Ms. Sandridge continues to feel better.  She walked a short distance in the room yesterday.  Cardiology has modified intravenous Lasix to oral torsemide at a dose of 40 mg daily now.  She is diuresed 900 mL.  Her weight is 169, which is up 1 pound.  OBJECTIVE:  VITAL SIGNS:  Temperature 98.3, pulse 78, blood pressure 111/65, oxygen saturation 96% on 2 L by nasal cannula. LUNGS:  Basilar rales. HEART:  Irregular with a grade 2 systolic murmur. ABDOMEN:  Less distended. EXTREMITIES:  Edema improved.  IMPRESSION/PLAN: 1. Acute on chronic diastolic heart failure.  Continue observation on     oral torsemide. 2. Chronic kidney disease.  BUN and creatinine are slightly improved     to 65 and 2.06.  GFR is 21. 3. Hypokalemia.  Serum potassium has normalized to 3.8.  Continue     supplements. 4. Diabetes, stable control.  Accu-Chek this morning is 104.  Glucoses     yesterday ranged from 139-185 on Lantus 6 units instead of 12. 5. Pulmonary hypertension. 6. Monoclonal gammopathy of undetermined significance. 7. Chronic atrial fibrillation.  Continue Xarelto.     Paula Compton. Willey Blade, MD     ROF/MEDQ  D:  08/13/2013  T:  08/13/2013  Job:  [id number]

## 2013-08-13 NOTE — Clinical Social Work Note (Signed)
Patient able to discharge to [facility] over weekend (facility has staff available to transport patient back to facility before 1 PM on Saturday and before 11 AM on Sunday).  MD willing to call new prescriptions to [facility] [phone]).  [facility] also contacting MD to coordinate.  ALF aware and agreeable to discharge plan, anticipate Sunday discharge.  Edwyna Shell, LCSW Clinical Social Worker [phone])

## 2013-08-14 LAB — GLUCOSE, CAPILLARY
GLUCOSE-CAPILLARY: 152 mg/dL — AB (ref 70–99)
Glucose-Capillary: 194 mg/dL — ABNORMAL HIGH (ref 70–99)
Glucose-Capillary: 112 mg/dL — ABNORMAL HIGH (ref 70–99)

## 2013-08-14 MED ORDER — TORSEMIDE 20 MG PO TABS: 40.0000 mg | ORAL_TABLET | Freq: Every day | ORAL | Status: DC

## 2013-08-14 MED ORDER — CIPROFLOXACIN HCL 250 MG PO TABS: 250.0000 mg | ORAL_TABLET | Freq: Two times a day (BID) | ORAL | Status: DC

## 2013-08-14 MED ORDER — INSULIN GLARGINE 100 UNIT/ML ~~LOC~~ SOLN: 6.0000 [IU] | Freq: Every day | SUBCUTANEOUS | Status: AC

## 2013-08-14 NOTE — Discharge Summary (Signed)
NAME:  Catherine [name], Catherine [name]                   ACCOUNT NO.:  [id number]  MEDICAL RECORD NO.:  [id number]  LOCATION:  A316                          FACILITY:  [facility]  PHYSICIAN:  Paula Compton. Willey Blade, MD       DATE OF BIRTH:  [birth year]  DATE OF ADMISSION:  08/09/2013 DATE OF DISCHARGE:  [facility]                              DISCHARGE SUMMARY   DISCHARGE DIAGNOSES: 1. Acute on chronic diastolic heart failure. 2. Chronic kidney disease, stage 4. 3. Pulmonary hypertension. 4. Mitral regurgitation. 5. Chronic atrial fibrillation. 6. Type 2 diabetes. 7. Epidermolysis bullosa. 8. Pseudomonas urinary tract infection. 9. Monoclonal myopathy of uncertain significance.  DISCHARGE MEDICATIONS: 1. Cipro 250 mg b.i.d. for 7 more days. 2. Lantus 6 units at bedtime. 3. Torsemide 40 mg daily. 4. Acetaminophen 650 mg every 6 hours p.r.n. 5. Atorvastatin 10 mg at bedtime. 6. Calcitriol 0.25 mcg daily. 7. Cholecalciferol 2000 units daily. 8. Citalopram 20 mg daily. 9. Clonazepam 0.5 mg t.i.d. p.r.n. 10.Vitamin B12 1000 mcg monthly. 11.Diltiazem CD 240 mg daily. 12.Ferrous sulfate 325 mg daily. 13.Fluocinonide cream 0.05% applied daily to itchy areas on trunk and     extremities. 14.Metoprolol 50 mg b.i.d. 15.Mirtazapine 7.5 mg at bedtime. 16.Mupirocin b.i.d. to leg wounds. 17.Calcium 500 mg b.i.d. 18.Pantoprazole 40 mg daily. 19.MiraLax 17 g daily p.r.n. 20.Potassium 40 mEq daily. 21.Xarelto 15 mg daily. 22.Triamcinolone 0.1% cream, apply daily to the lower extremities.  HOSPITAL COURSE:  This patient is an 80 year old female who presented with increased swelling in her extremities.  She noted increased shortness of breath.  She had been discharged from the hospital approximately 2 weeks earlier after treatment for similar symptoms.  She required treatment with IV Lasix.  She had good diuresis with gradual weight loss and reduction in edema in her arms, legs, and abdomen.  Her chest x-ray on admission was  actually improved from the prior film.  Her BNP has remained elevated in the 10,000 range.  Her discharge weight is 172.  Renal function and electrolytes have been monitored.  Her BUN and creatinine have actually improved during hospitalization to 65 and 2.06 with a GFR of 21, potassium is 3.8 at discharge.  Diabetes has been treated with low-dose Lantus and sliding scale NovoLog as needed. Discharge glucose is 152.  She has required continued treatment of her epidermolysis bullosa.  She continues chronic oxygen therapy for pulmonary hypertension.  Chronic atrial fibrillation is managed with metoprolol and diltiazem. She is anticoagulated with Xarelto.  Her monoclonal gammopathy has been followed in the Hematology Clinic. Blood counts have been stable.  Hemoglobin is 9.6 with an MCV of 103.9, white count and platelets are normal.  She has a chronic indwelling Foley because of urinary retention.  Her urine culture revealed Pseudomonas.  She was treated with Cipro and will require additional oral outpatient antibiotic therapy.  Her condition has gradually improved.  She is stable for discharge back to [facility] on the morning of August 14, 2013.  She will be seen in followup in my office in 1 week.  She will be seen in followup by Cardiology as well.  Diuretic therapy is being modified to torsemide  40 mg daily.  Lantus has been reduced to 6 units daily.     Paula Compton. Willey Blade, MD     ROF/MEDQ  D:  08/14/2013  T:  08/14/2013  Job:  [id number]

## 2013-08-14 NOTE — Progress Notes (Signed)
Discharge instructions packet given to transporter for [facility], d/c to [facility] in stable condition, out via w/c without difficulty.

## 2013-08-14 NOTE — Progress Notes (Signed)
Patient on telemetry.  In stable a. Fib, rate controlled, bp 94/42.  Notified Dr. Cindie Laroche of decreased blood pressure.  Told to held medication at this time.

## 2013-08-19 ENCOUNTER — Emergency Department (HOSPITAL_COMMUNITY): Payer: Medicare Other

## 2013-08-19 ENCOUNTER — Other Ambulatory Visit (HOSPITAL_COMMUNITY): Payer: Self-pay

## 2013-08-19 ENCOUNTER — Emergency Department (HOSPITAL_COMMUNITY)
Admission: EM | Admit: 2013-08-19 | Discharge: 2013-08-19 | Payer: Medicare Other | Attending: Emergency Medicine | Admitting: Emergency Medicine

## 2013-08-19 ENCOUNTER — Encounter (HOSPITAL_COMMUNITY): Payer: Self-pay | Admitting: Emergency Medicine

## 2013-08-19 DIAGNOSIS — I359 Nonrheumatic aortic valve disorder, unspecified: Secondary | ICD-10-CM | POA: Insufficient documentation

## 2013-08-19 DIAGNOSIS — J4489 Other specified chronic obstructive pulmonary disease: Secondary | ICD-10-CM | POA: Insufficient documentation

## 2013-08-19 DIAGNOSIS — Z872 Personal history of diseases of the skin and subcutaneous tissue: Secondary | ICD-10-CM | POA: Insufficient documentation

## 2013-08-19 DIAGNOSIS — Z9981 Dependence on supplemental oxygen: Secondary | ICD-10-CM | POA: Insufficient documentation

## 2013-08-19 DIAGNOSIS — E119 Type 2 diabetes mellitus without complications: Secondary | ICD-10-CM | POA: Insufficient documentation

## 2013-08-19 DIAGNOSIS — Z853 Personal history of malignant neoplasm of breast: Secondary | ICD-10-CM | POA: Insufficient documentation

## 2013-08-19 DIAGNOSIS — Z8673 Personal history of transient ischemic attack (TIA), and cerebral infarction without residual deficits: Secondary | ICD-10-CM | POA: Insufficient documentation

## 2013-08-19 DIAGNOSIS — I4891 Unspecified atrial fibrillation: Secondary | ICD-10-CM | POA: Insufficient documentation

## 2013-08-19 DIAGNOSIS — Z8669 Personal history of other diseases of the nervous system and sense organs: Secondary | ICD-10-CM | POA: Insufficient documentation

## 2013-08-19 DIAGNOSIS — D638 Anemia in other chronic diseases classified elsewhere: Secondary | ICD-10-CM

## 2013-08-19 DIAGNOSIS — L97909 Non-pressure chronic ulcer of unspecified part of unspecified lower leg with unspecified severity: Principal | ICD-10-CM | POA: Insufficient documentation

## 2013-08-19 DIAGNOSIS — Z862 Personal history of diseases of the blood and blood-forming organs and certain disorders involving the immune mechanism: Secondary | ICD-10-CM | POA: Insufficient documentation

## 2013-08-19 DIAGNOSIS — D649 Anemia, unspecified: Secondary | ICD-10-CM | POA: Insufficient documentation

## 2013-08-19 DIAGNOSIS — I83009 Varicose veins of unspecified lower extremity with ulcer of unspecified site: Secondary | ICD-10-CM | POA: Insufficient documentation

## 2013-08-19 DIAGNOSIS — M810 Age-related osteoporosis without current pathological fracture: Secondary | ICD-10-CM | POA: Insufficient documentation

## 2013-08-19 DIAGNOSIS — N184 Chronic kidney disease, stage 4 (severe): Secondary | ICD-10-CM | POA: Insufficient documentation

## 2013-08-19 DIAGNOSIS — Z79899 Other long term (current) drug therapy: Secondary | ICD-10-CM | POA: Insufficient documentation

## 2013-08-19 DIAGNOSIS — I5030 Unspecified diastolic (congestive) heart failure: Secondary | ICD-10-CM | POA: Insufficient documentation

## 2013-08-19 DIAGNOSIS — IMO0002 Reserved for concepts with insufficient information to code with codable children: Secondary | ICD-10-CM | POA: Insufficient documentation

## 2013-08-19 DIAGNOSIS — Z8639 Personal history of other endocrine, nutritional and metabolic disease: Secondary | ICD-10-CM | POA: Insufficient documentation

## 2013-08-19 DIAGNOSIS — J449 Chronic obstructive pulmonary disease, unspecified: Secondary | ICD-10-CM | POA: Insufficient documentation

## 2013-08-19 DIAGNOSIS — Z8781 Personal history of (healed) traumatic fracture: Secondary | ICD-10-CM | POA: Insufficient documentation

## 2013-08-19 DIAGNOSIS — I129 Hypertensive chronic kidney disease with stage 1 through stage 4 chronic kidney disease, or unspecified chronic kidney disease: Secondary | ICD-10-CM | POA: Insufficient documentation

## 2013-08-19 DIAGNOSIS — Z85828 Personal history of other malignant neoplasm of skin: Secondary | ICD-10-CM | POA: Insufficient documentation

## 2013-08-19 DIAGNOSIS — Z7901 Long term (current) use of anticoagulants: Secondary | ICD-10-CM | POA: Insufficient documentation

## 2013-08-19 DIAGNOSIS — I059 Rheumatic mitral valve disease, unspecified: Secondary | ICD-10-CM | POA: Insufficient documentation

## 2013-08-19 NOTE — ED Notes (Signed)
Social work Scientist, research (physical sciences)) called and made aware of social work consult ordered by ER MD. MD evaluated lower extremities and voiced the need for wound care at facility. Social work to eval for [place] needs.

## 2013-08-19 NOTE — ED Notes (Signed)
Pt from ALF [facility] sent to ER today d/t increased edema to bilateral lower extremities noted to be wrapped with cling. Pt also states she was admitted last week from Monday to Saturday d/t to the same. Pt has chronic indwelling foley. Pt denies any more SOB than normal. Pt chronically on 2L o2 via n/c. Pt resting quietly in bed at this time. No acute distress noted.

## 2013-08-19 NOTE — ED Notes (Signed)
Seen here last week for edema lower extremities.  Was admitted to hospital.  Now fluid is back and she feels like her abdomen is swollen too.  Denies any sob.

## 2013-08-19 NOTE — ED Notes (Addendum)
Agricultural consultant spoke with Conservation officer, nature from [employer] and [place] from [place] via speaker phone. Miranda RN requesting pt have BNP drawn before returning to [facility]. MD notified and declined to order BNP based on pt condition (pt has non-labored breathing and able to lay flat with no distress) and chronic CHF (chronic elevated BNP-10577 on 4/13). ED RN informed Home Health RN that MD suggested Home Health order BNP per PCP order.

## 2013-08-19 NOTE — ED Provider Notes (Signed)
CSN: [id number]     Arrival date & time 08/19/13  1119 History  This chart was scribed for Nat Christen, MD by Roxan Diesel, ED scribe.  This patient was seen in room APA01/APA01 and the patient's care was started at 12:03 PM.   Chief Complaint  Patient presents with  . Leg Swelling    The history is provided by the patient. No language interpreter was used.    HPI Comments: Catherine [name] is a 80 y.o. female with h/o CHF, COPD, DM, HTN, CKD who presents to the Emergency Department complaining of recurrent bilateral leg swelling.  Pt reports she was here last week for the same and admitted.  Now she feels her legs have become swollen again and also reports associated abdominal swelling.  She denies SOB.  Pt uses a walker at her baseline and has been able to walk a small amount in her room today.   Past Medical History  Diagnosis Date  . Diastolic heart failure     LVEF 60%  . Type 2 diabetes mellitus   . Essential hypertension, benign   . COPD (chronic obstructive pulmonary disease)     Home O2  . Vertigo   . Breast cancer   . Stroke      Patient denies  . Skin disorder     Epidermolysis bullosa.  . Pulmonary hypertension   . Multiple thyroid nodules   . Anemia   . Aortic stenosis   . Monoclonal gammopathy   . Diverticulosis   . Squamous cell carcinoma   . Osteoporosis   . CKD (chronic kidney disease) stage 4, GFR 15-29 ml/min   . Atrial fibrillation   . Mitral regurgitation     Moderate with severe left atrial enlargement    Past Surgical History  Procedure Laterality Date  . Cholecystectomy    . Hemorrhoid surgery    . Abdominal hysterectomy  1991    Complete hysterectomy for cancer  . Mastectomy  2005    Left breast, tamoxifin  . Goiter    . Hip fracture surgery  2010    Right hip replacement  . Hip fracture surgery  2006    Left hip replacement  . Colonoscopy  ?  Marland Kitchen Cataract extraction, bilateral      Family History  Problem Relation Age of Onset  .  Colon cancer Neg Hx   . Heart attack Son     Age 69s, suspected MI  . Diabetes Mother     History  Substance Use Topics  . Smoking status: Never Smoker   . Smokeless tobacco: Never Used  . Alcohol Use: No    OB History   Grav Para Term Preterm Abortions TAB SAB Ect Mult Living                   Review of Systems A complete 10 system review of systems was obtained and all systems are negative except as noted in the HPI and PMH.     Allergies  Amoxicillin-pot clavulanate  Home Medications   Prior to Admission medications   Medication Sig Start Date End Date Taking? Authorizing Provider  albuterol (PROVENTIL HFA;VENTOLIN HFA) 108 (90 BASE) MCG/ACT inhaler Inhale 2 puffs into the lungs every 4 (four) hours as needed for wheezing or shortness of breath.   Yes Historical Provider, MD  atorvastatin (LIPITOR) 10 MG tablet Take 10 mg by mouth at bedtime.   Yes Historical Provider, MD  calcitRIOL (ROCALTROL) 0.25 MCG capsule  Take 1 capsule (0.25 mcg total) by mouth daily. 08/02/13  Yes Asencion Noble, MD  cholecalciferol (VITAMIN D) 1000 UNITS tablet Take 2,000 Units by mouth daily.   Yes Historical Provider, MD  ciprofloxacin (CIPRO) 250 MG tablet Take 250 mg by mouth 2 (two) times daily. 08/14/13 08/21/13 Yes Asencion Noble, MD  citalopram (CELEXA) 20 MG tablet Take 20 mg by mouth daily.   Yes Historical Provider, MD  diltiazem (DILACOR XR) 240 MG 24 hr capsule Take 240 mg by mouth daily.   Yes Historical Provider, MD  ferrous sulfate 325 (65 FE) MG tablet Take 325 mg by mouth daily with breakfast.   Yes Historical Provider, MD  fluocinonide cream (LIDEX) 4.01 % Apply 1 application topically daily. Apply sparingly to itchy areas on trunk & extremeties   Yes Historical Provider, MD  insulin glargine (LANTUS) 100 UNIT/ML injection Inject 0.06 mLs (6 Units total) into the skin at bedtime. 08/14/13  Yes Asencion Noble, MD  loratadine (CLARITIN) 10 MG tablet Take 10 mg by mouth daily as needed for  allergies.   Yes Historical Provider, MD  metoprolol (LOPRESSOR) 50 MG tablet Take 50 mg by mouth 2 (two) times daily.   Yes Historical Provider, MD  mirtazapine (REMERON) 15 MG tablet Take 7.5 mg by mouth at bedtime.    Yes Historical Provider, MD  [place] (OYSTER CALCIUM) 500 MG TABS tablet Take 500 mg of elemental calcium by mouth 2 (two) times daily.   Yes Historical Provider, MD  pantoprazole (PROTONIX) 40 MG tablet Take 40 mg by mouth daily.   Yes Historical Provider, MD  potassium chloride SA (K-DUR,KLOR-CON) 20 MEQ tablet Take 40 mEq by mouth daily.    Yes Historical Provider, MD  Rivaroxaban (XARELTO) 15 MG TABS tablet Take 15 mg by mouth daily with supper.   Yes Historical Provider, MD  torsemide (DEMADEX) 20 MG tablet Take 2 tablets (40 mg total) by mouth daily. 08/14/13  Yes Asencion Noble, MD  triamcinolone cream (KENALOG) 0.1 % Apply 1 application topically daily. Apply to lower extremities.   Yes Historical Provider, MD  acetaminophen (TYLENOL) 325 MG tablet Take 650 mg by mouth every 6 (six) hours as needed for mild pain, fever or headache.     Historical Provider, MD  clonazePAM (KLONOPIN) 0.5 MG tablet Take 0.5 mg by mouth 3 (three) times daily as needed for anxiety.    Historical Provider, MD  cyanocobalamin (,VITAMIN B-12,) 1000 MCG/ML injection Inject 1,000 mcg into the muscle every 30 (thirty) days.    Historical Provider, MD  polyethylene glycol (MIRALAX / GLYCOLAX) packet Take 17 g by mouth daily as needed for mild constipation.    Historical Provider, MD   BP 110/70  Pulse 87  Temp(Src) 97.6 F (36.4 C) (Oral)  Resp 22  Wt 166 lb (75.297 kg)  SpO2 95%  Physical Exam  Nursing note and vitals reviewed. Constitutional: She is oriented to person, place, and time. She appears well-developed and well-nourished.  HENT:  Head: Normocephalic and atraumatic.  Eyes: Conjunctivae and EOM are normal. Pupils are equal, round, and reactive to light.  Neck: Normal range of motion.  Neck supple.  Cardiovascular: Normal rate, regular rhythm and normal heart sounds.   Pulmonary/Chest: Effort normal and breath sounds normal.  Abdominal: Soft. Bowel sounds are normal.  Musculoskeletal: Normal range of motion. She exhibits edema.  Bilateral lower extremity edema and venous stasis  Neurological: She is alert and oriented to person, place, and time.  Skin: Skin is  warm and dry.  Psychiatric: She has a normal mood and affect. Her behavior is normal.    ED Course  Procedures (including critical care time)  DIAGNOSTIC STUDIES: Oxygen Saturation is 95% on room air, adequate by my interpretation.    COORDINATION OF CARE: 12:07 PM-Discussed treatment plan with pt at bedside and pt agreed to plan.     Labs Review Labs Reviewed - No data to display  Imaging Review No results found.   EKG Interpretation None      MDM   Final diagnoses:  Venous stasis ulcers    Patient has venous stasis ulcers with ulceration.  Referral to wound care center. No clinical evidence of pulmonary edema or congestive heart failure.  Condition is chronic   I personally performed the services described in this documentation, which was scribed in my presence. The recorded information has been reviewed and is accurate.    Nat Christen, MD 08/22/13 [id number]

## 2013-08-19 NOTE — ED Notes (Signed)
RN spoke with Baker Janus at [facility]. [facility] to make wound healing appointments.

## 2013-08-19 NOTE — ED Notes (Signed)
Bilateral lower extremity dressings applied. Vaseline drsg with 4x4 and kerlix bilaterally.

## 2013-08-19 NOTE — Discharge Instructions (Signed)
Recommend wound care center referral.  Do not need to be admitted to the hospital for this.

## 2013-08-23 ENCOUNTER — Encounter (HOSPITAL_COMMUNITY): Payer: Self-pay | Admitting: Emergency Medicine

## 2013-08-23 ENCOUNTER — Other Ambulatory Visit: Payer: Self-pay

## 2013-08-23 ENCOUNTER — Emergency Department (HOSPITAL_COMMUNITY): Payer: Medicare Other

## 2013-08-23 ENCOUNTER — Inpatient Hospital Stay (HOSPITAL_COMMUNITY)
Admission: EM | Admit: 2013-08-23 | Discharge: 2013-08-30 | DRG: 291 | Disposition: A | Discharge status: Nursing Home (Includes ICF) | Payer: Medicare Other | Attending: Internal Medicine | Admitting: Internal Medicine

## 2013-08-23 DIAGNOSIS — Z978 Presence of other specified devices: Secondary | ICD-10-CM

## 2013-08-23 DIAGNOSIS — I4891 Unspecified atrial fibrillation: Secondary | ICD-10-CM | POA: Diagnosis present

## 2013-08-23 DIAGNOSIS — D472 Monoclonal gammopathy: Secondary | ICD-10-CM | POA: Diagnosis present

## 2013-08-23 DIAGNOSIS — Q819 Epidermolysis bullosa, unspecified: Secondary | ICD-10-CM

## 2013-08-23 DIAGNOSIS — D638 Anemia in other chronic diseases classified elsewhere: Secondary | ICD-10-CM | POA: Diagnosis present

## 2013-08-23 DIAGNOSIS — Z9981 Dependence on supplemental oxygen: Secondary | ICD-10-CM

## 2013-08-23 DIAGNOSIS — Z833 Family history of diabetes mellitus: Secondary | ICD-10-CM

## 2013-08-23 DIAGNOSIS — I272 Pulmonary hypertension, unspecified: Secondary | ICD-10-CM

## 2013-08-23 DIAGNOSIS — Z794 Long term (current) use of insulin: Secondary | ICD-10-CM

## 2013-08-23 DIAGNOSIS — R195 Other fecal abnormalities: Secondary | ICD-10-CM | POA: Diagnosis present

## 2013-08-23 DIAGNOSIS — R609 Edema, unspecified: Secondary | ICD-10-CM

## 2013-08-23 DIAGNOSIS — I2789 Other specified pulmonary heart diseases: Secondary | ICD-10-CM | POA: Diagnosis present

## 2013-08-23 DIAGNOSIS — Z96649 Presence of unspecified artificial hip joint: Secondary | ICD-10-CM

## 2013-08-23 DIAGNOSIS — Z7901 Long term (current) use of anticoagulants: Secondary | ICD-10-CM

## 2013-08-23 DIAGNOSIS — I5033 Acute on chronic diastolic (congestive) heart failure: Secondary | ICD-10-CM | POA: Diagnosis present

## 2013-08-23 DIAGNOSIS — N039 Chronic nephritic syndrome with unspecified morphologic changes: Principal | ICD-10-CM

## 2013-08-23 DIAGNOSIS — Z9010 Acquired absence of unspecified breast and nipple: Secondary | ICD-10-CM

## 2013-08-23 DIAGNOSIS — I959 Hypotension, unspecified: Secondary | ICD-10-CM | POA: Diagnosis present

## 2013-08-23 DIAGNOSIS — K921 Melena: Secondary | ICD-10-CM | POA: Diagnosis present

## 2013-08-23 DIAGNOSIS — J9611 Chronic respiratory failure with hypoxia: Secondary | ICD-10-CM | POA: Diagnosis present

## 2013-08-23 DIAGNOSIS — I872 Venous insufficiency (chronic) (peripheral): Secondary | ICD-10-CM | POA: Diagnosis present

## 2013-08-23 DIAGNOSIS — Q828 Other specified congenital malformations of skin: Secondary | ICD-10-CM

## 2013-08-23 DIAGNOSIS — Z8249 Family history of ischemic heart disease and other diseases of the circulatory system: Secondary | ICD-10-CM

## 2013-08-23 DIAGNOSIS — Z853 Personal history of malignant neoplasm of breast: Secondary | ICD-10-CM

## 2013-08-23 DIAGNOSIS — K429 Umbilical hernia without obstruction or gangrene: Secondary | ICD-10-CM | POA: Diagnosis present

## 2013-08-23 DIAGNOSIS — D62 Acute posthemorrhagic anemia: Secondary | ICD-10-CM

## 2013-08-23 DIAGNOSIS — N184 Chronic kidney disease, stage 4 (severe): Secondary | ICD-10-CM | POA: Diagnosis present

## 2013-08-23 DIAGNOSIS — E119 Type 2 diabetes mellitus without complications: Secondary | ICD-10-CM | POA: Diagnosis present

## 2013-08-23 DIAGNOSIS — K5790 Diverticulosis of intestine, part unspecified, without perforation or abscess without bleeding: Secondary | ICD-10-CM

## 2013-08-23 DIAGNOSIS — K625 Hemorrhage of anus and rectum: Secondary | ICD-10-CM

## 2013-08-23 DIAGNOSIS — E875 Hyperkalemia: Secondary | ICD-10-CM | POA: Diagnosis present

## 2013-08-23 DIAGNOSIS — I130 Hypertensive heart and chronic kidney disease with heart failure and stage 1 through stage 4 chronic kidney disease, or unspecified chronic kidney disease: Principal | ICD-10-CM | POA: Diagnosis present

## 2013-08-23 DIAGNOSIS — Z8673 Personal history of transient ischemic attack (TIA), and cerebral infarction without residual deficits: Secondary | ICD-10-CM

## 2013-08-23 DIAGNOSIS — I340 Nonrheumatic mitral (valve) insufficiency: Secondary | ICD-10-CM

## 2013-08-23 DIAGNOSIS — J9610 Chronic respiratory failure, unspecified whether with hypoxia or hypercapnia: Secondary | ICD-10-CM | POA: Diagnosis present

## 2013-08-23 DIAGNOSIS — M810 Age-related osteoporosis without current pathological fracture: Secondary | ICD-10-CM | POA: Diagnosis present

## 2013-08-23 DIAGNOSIS — I071 Rheumatic tricuspid insufficiency: Secondary | ICD-10-CM

## 2013-08-23 DIAGNOSIS — J449 Chronic obstructive pulmonary disease, unspecified: Secondary | ICD-10-CM | POA: Diagnosis present

## 2013-08-23 DIAGNOSIS — R5381 Other malaise: Secondary | ICD-10-CM | POA: Diagnosis present

## 2013-08-23 DIAGNOSIS — Z87440 Personal history of urinary (tract) infections: Secondary | ICD-10-CM

## 2013-08-23 DIAGNOSIS — J4489 Other specified chronic obstructive pulmonary disease: Secondary | ICD-10-CM | POA: Diagnosis present

## 2013-08-23 DIAGNOSIS — Z7189 Other specified counseling: Secondary | ICD-10-CM

## 2013-08-23 DIAGNOSIS — Z66 Do not resuscitate: Secondary | ICD-10-CM | POA: Diagnosis present

## 2013-08-23 DIAGNOSIS — I50813 Acute on chronic right heart failure: Secondary | ICD-10-CM

## 2013-08-23 DIAGNOSIS — I509 Heart failure, unspecified: Principal | ICD-10-CM | POA: Diagnosis present

## 2013-08-23 DIAGNOSIS — I10 Essential (primary) hypertension: Secondary | ICD-10-CM

## 2013-08-23 DIAGNOSIS — I2781 Cor pulmonale (chronic): Secondary | ICD-10-CM

## 2013-08-23 DIAGNOSIS — Z960 Presence of urogenital implants: Secondary | ICD-10-CM

## 2013-08-23 DIAGNOSIS — R0902 Hypoxemia: Secondary | ICD-10-CM | POA: Diagnosis present

## 2013-08-23 DIAGNOSIS — I5023 Acute on chronic systolic (congestive) heart failure: Secondary | ICD-10-CM

## 2013-08-23 LAB — CBC WITH DIFFERENTIAL/PLATELET
Basophils Absolute: 0 10*3/uL (ref 0.0–0.1)
Basophils Relative: 0 % (ref 0–1)
EOS ABS: 0.2 10*3/uL (ref 0.0–0.7)
Eosinophils Relative: 4 % (ref 0–5)
HCT: 31.1 % — ABNORMAL LOW (ref 36.0–46.0)
HEMOGLOBIN: 10 g/dL — AB (ref 12.0–15.0)
LYMPHS ABS: 0.3 10*3/uL — AB (ref 0.7–4.0)
Lymphocytes Relative: 5 % — ABNORMAL LOW (ref 12–46)
MCH: 33.3 pg (ref 26.0–34.0)
MCHC: 32.2 g/dL (ref 30.0–36.0)
MCV: 103.7 fL — ABNORMAL HIGH (ref 78.0–100.0)
MONOS PCT: 12 % (ref 3–12)
Monocytes Absolute: 0.8 10*3/uL (ref 0.1–1.0)
NEUTROS PCT: 79 % — AB (ref 43–77)
Neutro Abs: 5.1 10*3/uL (ref 1.7–7.7)
PLATELETS: 199 10*3/uL (ref 150–400)
RBC: 3 MIL/uL — AB (ref 3.87–5.11)
RDW: 14.9 % (ref 11.5–15.5)
WBC: 6.4 10*3/uL (ref 4.0–10.5)

## 2013-08-23 LAB — BASIC METABOLIC PANEL
BUN: 76 mg/dL — AB (ref 6–23)
CO2: 27 meq/L (ref 19–32)
Calcium: 10 mg/dL (ref 8.4–10.5)
Chloride: 96 mEq/L (ref 96–112)
Creatinine, Ser: 3.3 mg/dL — ABNORMAL HIGH (ref 0.50–1.10)
GFR calc Af Amer: 13 mL/min — ABNORMAL LOW (ref >90)
GFR, EST NON AFRICAN AMERICAN: 12 mL/min — AB (ref >90)
GLUCOSE: 261 mg/dL — AB (ref 70–99)
POTASSIUM: 6.6 meq/L — AB (ref 3.7–5.3)
Sodium: 136 mEq/L — ABNORMAL LOW (ref 137–147)

## 2013-08-23 LAB — GLUCOSE, CAPILLARY: GLUCOSE-CAPILLARY: 237 mg/dL — AB (ref 70–99)

## 2013-08-23 LAB — POTASSIUM: Potassium: 6.1 mEq/L — ABNORMAL HIGH (ref 3.7–5.3)

## 2013-08-23 LAB — PRO B NATRIURETIC PEPTIDE: Pro B Natriuretic peptide (BNP): 11032 pg/mL — ABNORMAL HIGH (ref 0–450)

## 2013-08-23 LAB — POC OCCULT BLOOD, ED: Fecal Occult Bld: POSITIVE — AB

## 2013-08-23 MED ORDER — SODIUM POLYSTYRENE SULFONATE 15 GM/60ML PO SUSP
30.0000 g | Freq: Once | ORAL | Status: AC
  Administered 2013-08-23: 30 g via ORAL
  Filled 2013-08-23: qty 120

## 2013-08-23 MED ORDER — FUROSEMIDE 10 MG/ML IJ SOLN
40.0000 mg | Freq: Once | INTRAMUSCULAR | Status: AC
  Administered 2013-08-23: 40 mg via INTRAVENOUS
  Filled 2013-08-23: qty 4

## 2013-08-23 MED ORDER — SODIUM CHLORIDE 0.9 % IV SOLN: 250.0000 mL | INTRAVENOUS | Status: DC | PRN

## 2013-08-23 MED ORDER — CITALOPRAM HYDROBROMIDE 20 MG PO TABS
20.0000 mg | ORAL_TABLET | Freq: Every day | ORAL | Status: DC
  Administered 2013-08-23 – 2013-08-30 (×8): 20 mg via ORAL
  Filled 2013-08-23 (×8): qty 1

## 2013-08-23 MED ORDER — ACETAMINOPHEN 325 MG PO TABS: 650.0000 mg | ORAL_TABLET | Freq: Four times a day (QID) | ORAL | Status: DC | PRN

## 2013-08-23 MED ORDER — ACETAMINOPHEN 650 MG RE SUPP: 650.0000 mg | Freq: Four times a day (QID) | RECTAL | Status: DC | PRN

## 2013-08-23 MED ORDER — POLYETHYLENE GLYCOL 3350 17 G PO PACK
17.0000 g | PACK | Freq: Every day | ORAL | Status: DC | PRN
  Administered 2013-08-27: 17 g via ORAL
  Filled 2013-08-23: qty 1

## 2013-08-23 MED ORDER — ATORVASTATIN CALCIUM 10 MG PO TABS
10.0000 mg | ORAL_TABLET | Freq: Every day | ORAL | Status: DC
  Administered 2013-08-23 – 2013-08-24 (×2): 10 mg via ORAL
  Filled 2013-08-23 (×2): qty 1

## 2013-08-23 MED ORDER — SODIUM CHLORIDE 0.9 % IJ SOLN
3.0000 mL | Freq: Two times a day (BID) | INTRAMUSCULAR | Status: DC
  Administered 2013-08-23 – 2013-08-30 (×12): 3 mL via INTRAVENOUS

## 2013-08-23 MED ORDER — VITAMIN D 1000 UNITS PO TABS
2000.0000 [IU] | ORAL_TABLET | Freq: Every day | ORAL | Status: DC
  Administered 2013-08-23 – 2013-08-24 (×2): 2000 [IU] via ORAL
  Filled 2013-08-23 (×2): qty 2

## 2013-08-23 MED ORDER — INSULIN ASPART 100 UNIT/ML ~~LOC~~ SOLN
0.0000 [IU] | Freq: Every day | SUBCUTANEOUS | Status: DC
  Administered 2013-08-23 – 2013-08-28 (×2): 2 [IU] via SUBCUTANEOUS

## 2013-08-23 MED ORDER — FERROUS SULFATE 325 (65 FE) MG PO TABS
325.0000 mg | ORAL_TABLET | Freq: Every day | ORAL | Status: DC
  Administered 2013-08-24: 325 mg via ORAL
  Filled 2013-08-23: qty 1

## 2013-08-23 MED ORDER — SODIUM CHLORIDE 0.9 % IJ SOLN
3.0000 mL | INTRAMUSCULAR | Status: DC | PRN
  Administered 2013-08-24 – 2013-08-28 (×2): 3 mL via INTRAVENOUS

## 2013-08-23 MED ORDER — INSULIN ASPART 100 UNIT/ML ~~LOC~~ SOLN
0.0000 [IU] | Freq: Three times a day (TID) | SUBCUTANEOUS | Status: DC
  Administered 2013-08-24 (×2): 2 [IU] via SUBCUTANEOUS
  Administered 2013-08-24: 3 [IU] via SUBCUTANEOUS
  Administered 2013-08-26 – 2013-08-27 (×4): 2 [IU] via SUBCUTANEOUS
  Administered 2013-08-28: 3 [IU] via SUBCUTANEOUS
  Administered 2013-08-30: 2 [IU] via SUBCUTANEOUS

## 2013-08-23 MED ORDER — ALBUTEROL SULFATE (2.5 MG/3ML) 0.083% IN NEBU: 2.5000 mg | INHALATION_SOLUTION | RESPIRATORY_TRACT | Status: DC | PRN

## 2013-08-23 MED ORDER — CALCITRIOL 0.25 MCG PO CAPS
0.2500 ug | ORAL_CAPSULE | Freq: Every day | ORAL | Status: DC
  Administered 2013-08-24: 0.25 ug via ORAL
  Filled 2013-08-23: qty 1

## 2013-08-23 MED ORDER — SODIUM CHLORIDE 0.9 % IV SOLN: 1.0000 g | Freq: Once | INTRAVENOUS | Status: DC

## 2013-08-23 MED ORDER — INSULIN GLARGINE 100 UNIT/ML ~~LOC~~ SOLN
6.0000 [IU] | Freq: Every day | SUBCUTANEOUS | Status: DC
  Administered 2013-08-23 – 2013-08-29 (×7): 6 [IU] via SUBCUTANEOUS
  Filled 2013-08-23 (×8): qty 0.06

## 2013-08-23 MED ORDER — ONDANSETRON HCL 4 MG PO TABS: 4.0000 mg | ORAL_TABLET | Freq: Four times a day (QID) | ORAL | Status: DC | PRN

## 2013-08-23 MED ORDER — TRIAMCINOLONE ACETONIDE 0.1 % EX CREA
1.0000 "application " | TOPICAL_CREAM | Freq: Every day | CUTANEOUS | Status: DC
  Administered 2013-08-25 – 2013-08-30 (×6): 1 via TOPICAL
  Filled 2013-08-23 (×2): qty 15

## 2013-08-23 MED ORDER — MIRTAZAPINE 15 MG PO TABS
7.5000 mg | ORAL_TABLET | Freq: Every day | ORAL | Status: DC
  Administered 2013-08-23 – 2013-08-24 (×2): 7.5 mg via ORAL
  Filled 2013-08-23 (×2): qty 0.5

## 2013-08-23 MED ORDER — FUROSEMIDE 10 MG/ML IJ SOLN
40.0000 mg | Freq: Two times a day (BID) | INTRAMUSCULAR | Status: DC
  Administered 2013-08-23 – 2013-08-30 (×14): 40 mg via INTRAVENOUS
  Filled 2013-08-23 (×13): qty 4

## 2013-08-23 MED ORDER — ONDANSETRON HCL 4 MG/2ML IJ SOLN
4.0000 mg | Freq: Four times a day (QID) | INTRAMUSCULAR | Status: DC | PRN
  Administered 2013-08-25: 4 mg via INTRAVENOUS
  Filled 2013-08-23: qty 2

## 2013-08-23 MED ORDER — CLONAZEPAM 0.5 MG PO TABS
0.5000 mg | ORAL_TABLET | Freq: Three times a day (TID) | ORAL | Status: DC | PRN
  Administered 2013-08-23 – 2013-08-29 (×8): 0.5 mg via ORAL
  Filled 2013-08-23 (×8): qty 1

## 2013-08-23 MED ORDER — FLUOCINONIDE 0.05 % EX CREA
1.0000 "application " | TOPICAL_CREAM | Freq: Every day | CUTANEOUS | Status: DC
  Administered 2013-08-24 – 2013-08-30 (×7): 1 via TOPICAL
  Filled 2013-08-23: qty 30

## 2013-08-23 MED ORDER — ALBUTEROL SULFATE (2.5 MG/3ML) 0.083% IN NEBU
2.5000 mg | INHALATION_SOLUTION | Freq: Four times a day (QID) | RESPIRATORY_TRACT | Status: DC
  Administered 2013-08-23 – 2013-08-24 (×3): 2.5 mg via RESPIRATORY_TRACT
  Filled 2013-08-23 (×3): qty 3

## 2013-08-23 MED ORDER — PANTOPRAZOLE SODIUM 40 MG PO TBEC
40.0000 mg | DELAYED_RELEASE_TABLET | Freq: Every day | ORAL | Status: DC
  Administered 2013-08-23 – 2013-08-30 (×8): 40 mg via ORAL
  Filled 2013-08-23 (×8): qty 1

## 2013-08-23 NOTE — H&P (Signed)
[facility] History and Physical  Catherine [name] LZJ:[id number] DOB: 19-Mar-1924 DOA: 08/23/2013  Referring physician: Nat Christen, ER physician PCP: Asencion Noble, MD   Chief Complaint: Swelling in lower extremities  HPI: Catherine [name] is a 80 y.o. female who has a complex medical history, presents to the emergency room with complaints of increased weight gain and lower extremity edema. The patient has known right-sided heart failure and has had multiple admissions for the same. She was recently discharged on 4/18 after being treated for congestive heart failure. She felt that at the time of discharge, edema in her legs had significantly improved. She's not sure when the edema started to reaccumulate, but she does feel that her legs are now significantly more swollen and her weight has increased. She denies any significant shortness of breath, more than her baseline. She is chronically on 2 L of oxygen. She denies any chest pain. She has not had any fever or cough. She has a chronic Foley catheter. Has not had any vomiting or diarrhea. Stool Hemoccult in the emergency room was found to be positive. She was also noted to be significantly hyperkalemic and creatinine was above her baseline. She's been referred for admission.   Review of Systems:  Pertinent positives as per history of present illness, otherwise negative   Past Medical History  Diagnosis Date  . Diastolic heart failure     LVEF 60%  . Type 2 diabetes mellitus   . Essential hypertension, benign   . COPD (chronic obstructive pulmonary disease)     Home O2  . Vertigo   . Breast cancer   . Stroke      Patient denies  . Skin disorder     Epidermolysis bullosa.  . Pulmonary hypertension   . Multiple thyroid nodules   . Anemia   . Aortic stenosis   . Monoclonal gammopathy   . Diverticulosis   . Squamous cell carcinoma   . Osteoporosis   . CKD (chronic kidney disease) stage 4, GFR 15-29 ml/min   . Atrial fibrillation   .  Mitral regurgitation     Moderate with severe left atrial enlargement   Past Surgical History  Procedure Laterality Date  . Cholecystectomy    . Hemorrhoid surgery    . Abdominal hysterectomy  1991    Complete hysterectomy for cancer  . Mastectomy  2005    Left breast, tamoxifin  . Goiter    . Hip fracture surgery  2010    Right hip replacement  . Hip fracture surgery  2006    Left hip replacement  . Colonoscopy  ?  Marland Kitchen Cataract extraction, bilateral     Social History:  reports that she has never smoked. She has never used smokeless tobacco. She reports that she does not drink alcohol or use illicit drugs.  Allergies  Allergen Reactions  . Amoxicillin-Pot Clavulanate Diarrhea    Family History  Problem Relation Age of Onset  . Colon cancer Neg Hx   . Heart attack Son     Age 55s, suspected MI  . Diabetes Mother      Prior to Admission medications   Medication Sig Start Date End Date Taking? Authorizing Provider  ciprofloxacin (CIPRO) 250 MG tablet Take 250 mg by mouth 2 (two) times daily. Twice daily for 7 days 08/13/13  Yes Historical Provider, MD  diltiazem (DILACOR XR) 240 MG 24 hr capsule Take 240 mg by mouth daily.   Yes Historical Provider, MD  acetaminophen (TYLENOL) 325  MG tablet Take 650 mg by mouth every 6 (six) hours as needed for mild pain, fever or headache.     Historical Provider, MD  albuterol (PROVENTIL HFA;VENTOLIN HFA) 108 (90 BASE) MCG/ACT inhaler Inhale 2 puffs into the lungs every 4 (four) hours as needed for wheezing or shortness of breath.    Historical Provider, MD  atorvastatin (LIPITOR) 10 MG tablet Take 10 mg by mouth at bedtime.    Historical Provider, MD  calcitRIOL (ROCALTROL) 0.25 MCG capsule Take 1 capsule (0.25 mcg total) by mouth daily. 08/02/13   Carylon Perches, MD  cholecalciferol (VITAMIN D) 1000 UNITS tablet Take 2,000 Units by mouth daily.    Historical Provider, MD  citalopram (CELEXA) 20 MG tablet Take 20 mg by mouth daily.    Historical  Provider, MD  clonazePAM (KLONOPIN) 0.5 MG tablet Take 0.5 mg by mouth 3 (three) times daily as needed for anxiety.    Historical Provider, MD  cyanocobalamin (,VITAMIN B-12,) 1000 MCG/ML injection Inject 1,000 mcg into the muscle every 30 (thirty) days.    Historical Provider, MD  ferrous sulfate 325 (65 FE) MG tablet Take 325 mg by mouth daily with breakfast.    Historical Provider, MD  fluocinonide cream (LIDEX) 0.05 % Apply 1 application topically daily. Apply sparingly to itchy areas on trunk & extremeties    Historical Provider, MD  insulin glargine (LANTUS) 100 UNIT/ML injection Inject 0.06 mLs (6 Units total) into the skin at bedtime. 08/14/13   Carylon Perches, MD  metoprolol (LOPRESSOR) 50 MG tablet Take 50 mg by mouth 2 (two) times daily.    Historical Provider, MD  mirtazapine (REMERON) 15 MG tablet Take 7.5 mg by mouth at bedtime.     Historical Provider, MD  Ethelda Chick (OYSTER CALCIUM) 500 MG TABS tablet Take 500 mg of elemental calcium by mouth 2 (two) times daily.    Historical Provider, MD  pantoprazole (PROTONIX) 40 MG tablet Take 40 mg by mouth daily.    Historical Provider, MD  polyethylene glycol (MIRALAX / GLYCOLAX) packet Take 17 g by mouth daily as needed for mild constipation.    Historical Provider, MD  potassium chloride SA (K-DUR,KLOR-CON) 20 MEQ tablet Take 40 mEq by mouth daily.     Historical Provider, MD  Rivaroxaban (XARELTO) 15 MG TABS tablet Take 15 mg by mouth daily with supper.    Historical Provider, MD  torsemide (DEMADEX) 20 MG tablet Take 2 tablets (40 mg total) by mouth daily. 08/14/13   Carylon Perches, MD  triamcinolone cream (KENALOG) 0.1 % Apply 1 application topically daily. Apply to lower extremities.    Historical Provider, MD   Physical Exam: Filed Vitals:   08/23/13 1608  BP: 99/64  Pulse: 68  Temp:   Resp: 12    BP 99/64  Pulse 68  Temp(Src) 96.2 F (35.7 C) (Rectal)  Resp 12  SpO2 99%  General:  Appears calm and comfortable Eyes: PERRL, normal  lids, irises & conjunctiva ENT: grossly normal hearing, lips & tongue Neck: no LAD, masses or thyromegaly Cardiovascular: s1, s2, irregular, no m/r/g. 1-2+ edema in the thighs Respiratory: CTA bilaterally, no w/r/r. Normal respiratory effort. Abdomen: soft, nt, ventral hernia present that is reproducible, bs+ Skin: various bullous lesions noted on extremities, particularly RLE, draining Musculoskeletal: grossly normal tone BUE/BLE Psychiatric: grossly normal mood and affect, speech fluent and appropriate Neurologic: grossly non-focal.          Labs on Admission:  Basic Metabolic Panel:  Recent Labs Lab  08/23/13 1202  NA 136*  K 6.6*  CL 96  CO2 27  GLUCOSE 261*  BUN 76*  CREATININE 3.30*  CALCIUM 10.0   Liver Function Tests: No results found for this basename: AST, ALT, ALKPHOS, BILITOT, PROT, ALBUMIN,  in the last 168 hours No results found for this basename: LIPASE, AMYLASE,  in the last 168 hours No results found for this basename: AMMONIA,  in the last 168 hours CBC:  Recent Labs Lab 08/23/13 1202  WBC 6.4  NEUTROABS 5.1  HGB 10.0*  HCT 31.1*  MCV 103.7*  PLT 199   Cardiac Enzymes: No results found for this basename: CKTOTAL, CKMB, CKMBINDEX, TROPONINI,  in the last 168 hours  BNP (last 3 results)  Recent Labs  07/27/13 1503 08/09/13 0807 08/23/13 1202  PROBNP 12316.0* 10577.0* 11032.0*   CBG: No results found for this basename: GLUCAP,  in the last 168 hours  Radiological Exams on Admission: Dg Chest Portable 1 View  08/23/2013   CLINICAL DATA:  Rectal bleeding and CHF.  EXAM: PORTABLE CHEST - 1 VIEW  COMPARISON:  08/19/2013  FINDINGS: Mild elevation of the right hemidiaphragm is unchanged. Heart size is upper limits of normal but unchanged. The thoracic aortic arch is heavily calcified. There are old right rib fractures. Mild haziness in both lungs have not significantly changed. No evidence for pulmonary edema. No focal airspace disease.   IMPRESSION: No acute chest abnormality.  Atherosclerotic disease.   Electronically Signed   By: Markus Daft M.D.   On: 08/23/2013 14:23    EKG: Independently reviewed. Wide QRS complex  Assessment/Plan Active Problems:   Anemia of other chronic disease   Atrial fibrillation   Chronic kidney disease (CKD) stage G4/A1, severely decreased glomerular filtration rate (GFR) between 15-29 mL/min/1.73 square meter and albuminuria creatinine ratio less than 30 mg/g   Diabetes   Chronic indwelling Foley catheter   Chronic respiratory failure with hypoxia   Hyperkalemia   Heme positive stool   1. Acute on chronic right-sided heart failure/diastolic congestive heart failure. Echocardiogram was recently checked in 07/2013 and ejection fraction was found to be normal. She does appear to have worsening edema in her lower extremities. She is on Demadex as an outpatient. This will be switched over to intravenous Lasix. On her previous admission, she was receiving 80 mg of Lasix twice a day. Since her blood pressure is on the lower side, will use 40 mg IV twice a day for now. 2. Chronic kidney disease stage IV. Creatinine appears to be about baseline. If this continues to worsen with diuresis, she may need nephrology input. 3. Anemia of chronic disease. Hemoglobin appears to be near baseline. She was found to have heme-positive stools. She is also on Xarelto for atrial fibrillation. We will request gastroenterology input to see if any further endoscopy is required. She reports recently having colonoscopy at [facility]. She's unsure of the time frame. 4. Hyperkalemia. The patient was taking potassium supplements prior to admission. These will be held. She's already received a dose of Kayexalate as well as Lasix in the emergency room. We will also give calcium gluconate. Repeat potassium level this evening. 5. Chronic respiratory failure on 2 L of oxygen. Patient reports that her breathing is at baseline. Chest  x-ray does not show any significant edema. 6. Diabetes. Start on sliding scale insulin. Continue Lantus. 7. Atrial fibrillation. We'll have told her to control medication including diltiazem and Lopressor for now due to borderline blood pressures. These  can certainly be resumed as her blood pressure tolerates. 8. Epidermolysis bullosa. Continue patient's topical creams.   Code Status: DNR Family Communication: discussed with patient Disposition Plan: patient is a resident of [facility], although she may need a higher level of care such as a SNF. We'll request physical therapy evaluation  Time spent: 71mins  Armari Fussell [facility] Pager [phone]

## 2013-08-23 NOTE — ED Provider Notes (Signed)
CSN: [id number]     Arrival date & time 08/23/13  1153 History  This chart was scribed for Asencion Noble, MD by Allena Earing, ED Scribe. This patient was seen in room APA15/APA15 and the patient's care was started at 1:40 PM .    Chief Complaint  Patient presents with  . Rectal Bleeding      The history is provided by the patient. No language interpreter was used.    HPI Comments: Level V caveat for urgent need for intervention BLENDA WISECUP is a 80 y.o. female who presents to the Emergency Department complaining of rectal bleeding that was noticed by staff at assisted living facility. The staff reports seeing "bright red stool".  Patient has also been retaining fluid, approximately 5 pounds in past 2 days. Additionally she has chronic venous stasis ulcers of her lower extremities that have worsened. No fever, chills, chest pain, dyspnea.    LEVEL 5 CAVEAT CONFUSION   Past Medical History  Diagnosis Date  . Diastolic heart failure     LVEF 60%  . Type 2 diabetes mellitus   . Essential hypertension, benign   . COPD (chronic obstructive pulmonary disease)     Home O2  . Vertigo   . Breast cancer   . Stroke      Patient denies  . Skin disorder     Epidermolysis bullosa.  . Pulmonary hypertension   . Multiple thyroid nodules   . Anemia   . Aortic stenosis   . Monoclonal gammopathy   . Diverticulosis   . Squamous cell carcinoma   . Osteoporosis   . CKD (chronic kidney disease) stage 4, GFR 15-29 ml/min   . Atrial fibrillation   . Mitral regurgitation     Moderate with severe left atrial enlargement   Past Surgical History  Procedure Laterality Date  . Cholecystectomy    . Hemorrhoid surgery    . Abdominal hysterectomy  1991    Complete hysterectomy for cancer  . Mastectomy  2005    Left breast, tamoxifin  . Goiter    . Hip fracture surgery  2010    Right hip replacement  . Hip fracture surgery  2006    Left hip replacement  . Colonoscopy  ?  Marland Kitchen Cataract  extraction, bilateral     Family History  Problem Relation Age of Onset  . Colon cancer Neg Hx   . Heart attack Son     Age 39s, suspected MI  . Diabetes Mother    History  Substance Use Topics  . Smoking status: Never Smoker   . Smokeless tobacco: Never Used  . Alcohol Use: No   OB History   Grav Para Term Preterm Abortions TAB SAB Ect Mult Living                 Review of Systems  Unable to perform ROS Gastrointestinal: Positive for hematochezia.    A complete 10 system review of systems was obtained and all systems are negative except as noted in the HPI and PMH.      Allergies  Amoxicillin-pot clavulanate  Home Medications   Prior to Admission medications   Medication Sig Start Date End Date Taking? Authorizing Provider  ciprofloxacin (CIPRO) 250 MG tablet Take 250 mg by mouth 2 (two) times daily. Twice daily for 7 days 08/13/13  Yes Historical Provider, MD  diltiazem (DILACOR XR) 240 MG 24 hr capsule Take 240 mg by mouth daily.   Yes Historical Provider, MD  acetaminophen (TYLENOL) 325 MG tablet Take 650 mg by mouth every 6 (six) hours as needed for mild pain, fever or headache.     Historical Provider, MD  albuterol (PROVENTIL HFA;VENTOLIN HFA) 108 (90 BASE) MCG/ACT inhaler Inhale 2 puffs into the lungs every 4 (four) hours as needed for wheezing or shortness of breath.    Historical Provider, MD  atorvastatin (LIPITOR) 10 MG tablet Take 10 mg by mouth at bedtime.    Historical Provider, MD  calcitRIOL (ROCALTROL) 0.25 MCG capsule Take 1 capsule (0.25 mcg total) by mouth daily. 08/02/13   Asencion Noble, MD  cholecalciferol (VITAMIN D) 1000 UNITS tablet Take 2,000 Units by mouth daily.    Historical Provider, MD  citalopram (CELEXA) 20 MG tablet Take 20 mg by mouth daily.    Historical Provider, MD  clonazePAM (KLONOPIN) 0.5 MG tablet Take 0.5 mg by mouth 3 (three) times daily as needed for anxiety.    Historical Provider, MD  cyanocobalamin (,VITAMIN B-12,) 1000 MCG/ML  injection Inject 1,000 mcg into the muscle every 30 (thirty) days.    Historical Provider, MD  ferrous sulfate 325 (65 FE) MG tablet Take 325 mg by mouth daily with breakfast.    Historical Provider, MD  fluocinonide cream (LIDEX) 6.60 % Apply 1 application topically daily. Apply sparingly to itchy areas on trunk & extremeties    Historical Provider, MD  insulin glargine (LANTUS) 100 UNIT/ML injection Inject 0.06 mLs (6 Units total) into the skin at bedtime. 08/14/13   Asencion Noble, MD  metoprolol (LOPRESSOR) 50 MG tablet Take 50 mg by mouth 2 (two) times daily.    Historical Provider, MD  mirtazapine (REMERON) 15 MG tablet Take 7.5 mg by mouth at bedtime.     Historical Provider, MD  Loma Boston (OYSTER CALCIUM) 500 MG TABS tablet Take 500 mg of elemental calcium by mouth 2 (two) times daily.    Historical Provider, MD  pantoprazole (PROTONIX) 40 MG tablet Take 40 mg by mouth daily.    Historical Provider, MD  polyethylene glycol (MIRALAX / GLYCOLAX) packet Take 17 g by mouth daily as needed for mild constipation.    Historical Provider, MD  potassium chloride SA (K-DUR,KLOR-CON) 20 MEQ tablet Take 40 mEq by mouth daily.     Historical Provider, MD  Rivaroxaban (XARELTO) 15 MG TABS tablet Take 15 mg by mouth daily with supper.    Historical Provider, MD  torsemide (DEMADEX) 20 MG tablet Take 2 tablets (40 mg total) by mouth daily. 08/14/13   Asencion Noble, MD  triamcinolone cream (KENALOG) 0.1 % Apply 1 application topically daily. Apply to lower extremities.    Historical Provider, MD   BP 93/55  Pulse 66  Resp 16  SpO2 95% Physical Exam  Nursing note and vitals reviewed. Constitutional: She is oriented to person, place, and time. She appears well-developed and well-nourished.  HENT:  Head: Normocephalic and atraumatic.  Eyes: Conjunctivae and EOM are normal. Pupils are equal, round, and reactive to light.  Neck: Normal range of motion. Neck supple.  Cardiovascular: Normal rate, regular rhythm and  normal heart sounds.   Pulmonary/Chest: Effort normal and breath sounds normal.  Abdominal: Soft. Bowel sounds are normal.  Musculoskeletal: Normal range of motion.  Neurological: She is alert and oriented to person, place, and time. No cranial nerve deficit. Coordination normal.  Skin: Skin is warm and dry.  Dermatosis in lower extremities Lower leg edema bilaterally   Psychiatric: She has a normal mood and affect. Her behavior is  normal.    ED Course  Procedures (including critical care time)  DIAGNOSTIC STUDIES: Oxygen Saturation is 95% on RA, normal by my interpretation.    COORDINATION OF CARE:   1:43 PM-Discussed treatment plan which includes labs with pt at bedside and pt agreed to plan.   Labs Review Labs Reviewed  CBC WITH DIFFERENTIAL - Abnormal; Notable for the following:    RBC 3.00 (*)    Hemoglobin 10.0 (*)    HCT 31.1 (*)    MCV 103.7 (*)    Neutrophils Relative % 79 (*)    Lymphocytes Relative 5 (*)    Lymphs Abs 0.3 (*)    All other components within normal limits  BASIC METABOLIC PANEL - Abnormal; Notable for the following:    Sodium 136 (*)    Potassium 6.6 (*)    Glucose, Bld 261 (*)    BUN 76 (*)    Creatinine, Ser 3.30 (*)    GFR calc non Af Amer 12 (*)    GFR calc Af Amer 13 (*)    All other components within normal limits  PRO B NATRIURETIC PEPTIDE - Abnormal; Notable for the following:    Pro B Natriuretic peptide (BNP) 11032.0 (*)    All other components within normal limits  POC OCCULT BLOOD, ED - Abnormal; Notable for the following:    Fecal Occult Bld POSITIVE (*)    All other components within normal limits    Imaging Review Dg Chest Portable 1 View  08/23/2013   CLINICAL DATA:  Rectal bleeding and CHF.  EXAM: PORTABLE CHEST - 1 VIEW  COMPARISON:  08/19/2013  FINDINGS: Mild elevation of the right hemidiaphragm is unchanged. Heart size is upper limits of normal but unchanged. The thoracic aortic arch is heavily calcified. There are old  right rib fractures. Mild haziness in both lungs have not significantly changed. No evidence for pulmonary edema. No focal airspace disease.  IMPRESSION: No acute chest abnormality.  Atherosclerotic disease.   Electronically Signed   By: Markus Daft M.D.   On: 08/23/2013 14:23     EKG Interpretation None      Date: 08/23/2013  Rate: 66  Rhythm: wide QRS rhythm  QRS Axis: normal  Intervals: normal  ST/T Wave abnormalities: normal  Conduction Disutrbances: widened QRS, RBBB  Narrative Interpretation: unremarkable   CRITICAL CARE Performed by: Nat Christen Total critical care time: 30 Critical care time was exclusive of separately billable procedures and treating other patients. Critical care was necessary to treat or prevent imminent or life-threatening deterioration. Critical care was time spent personally by me on the following activities: development of treatment plan with patient and/or surrogate as well as nursing, discussions with consultants, evaluation of patient's response to treatment, examination of patient, obtaining history from patient or surrogate, ordering and performing treatments and interventions, ordering and review of laboratory studies, ordering and review of radiographic studies, pulse oximetry and re-evaluation of patient's condition. MDM   Final diagnoses:  Edema  Rectal bleeding  Hypotension   I personally performed the services described in this documentation, which was scribed in my presence. The recorded information has been reviewed and is accurate.   Patient is complex. She has obvious fluid overload. She is also hypotensive. Rectal exam was heme positive for blood. Will hydrate gently and give Lasix 40 mg IV. Discussed with Dr. Arvilla Market, MD 08/23/13 1740

## 2013-08-23 NOTE — ED Notes (Signed)
Per ems, pt from [facility].  Per ems, staff reports bright red blood in the pt's stool and a 5lb weight gain within the past 2 days.  Pt has obvious swelling to her abd.  Pt denies any pain.

## 2013-08-24 ENCOUNTER — Ambulatory Visit (HOSPITAL_COMMUNITY): Payer: Medicare Other

## 2013-08-24 DIAGNOSIS — I10 Essential (primary) hypertension: Secondary | ICD-10-CM

## 2013-08-24 DIAGNOSIS — E119 Type 2 diabetes mellitus without complications: Secondary | ICD-10-CM

## 2013-08-24 DIAGNOSIS — R609 Edema, unspecified: Secondary | ICD-10-CM

## 2013-08-24 DIAGNOSIS — Z515 Encounter for palliative care: Secondary | ICD-10-CM

## 2013-08-24 DIAGNOSIS — D62 Acute posthemorrhagic anemia: Secondary | ICD-10-CM

## 2013-08-24 DIAGNOSIS — I2789 Other specified pulmonary heart diseases: Secondary | ICD-10-CM

## 2013-08-24 DIAGNOSIS — K625 Hemorrhage of anus and rectum: Secondary | ICD-10-CM

## 2013-08-24 DIAGNOSIS — Q828 Other specified congenital malformations of skin: Secondary | ICD-10-CM

## 2013-08-24 DIAGNOSIS — I079 Rheumatic tricuspid valve disease, unspecified: Secondary | ICD-10-CM

## 2013-08-24 DIAGNOSIS — I059 Rheumatic mitral valve disease, unspecified: Secondary | ICD-10-CM

## 2013-08-24 DIAGNOSIS — I5023 Acute on chronic systolic (congestive) heart failure: Secondary | ICD-10-CM

## 2013-08-24 DIAGNOSIS — I279 Pulmonary heart disease, unspecified: Secondary | ICD-10-CM

## 2013-08-24 LAB — BASIC METABOLIC PANEL
BUN: 81 mg/dL — ABNORMAL HIGH (ref 6–23)
CHLORIDE: 99 meq/L (ref 96–112)
CO2: 27 mEq/L (ref 19–32)
CREATININE: 3.06 mg/dL — AB (ref 0.50–1.10)
Calcium: 9.7 mg/dL (ref 8.4–10.5)
GFR calc Af Amer: 15 mL/min — ABNORMAL LOW (ref >90)
GFR, EST NON AFRICAN AMERICAN: 13 mL/min — AB (ref >90)
Glucose, Bld: 206 mg/dL — ABNORMAL HIGH (ref 70–99)
POTASSIUM: 5.5 meq/L — AB (ref 3.7–5.3)
Sodium: 139 mEq/L (ref 137–147)

## 2013-08-24 LAB — GLUCOSE, CAPILLARY
GLUCOSE-CAPILLARY: 164 mg/dL — AB (ref 70–99)
GLUCOSE-CAPILLARY: 167 mg/dL — AB (ref 70–99)
Glucose-Capillary: 155 mg/dL — ABNORMAL HIGH (ref 70–99)
Glucose-Capillary: 141 mg/dL — ABNORMAL HIGH (ref 70–99)

## 2013-08-24 LAB — CBC
HEMATOCRIT: 27.3 % — AB (ref 36.0–46.0)
Hemoglobin: 8.7 g/dL — ABNORMAL LOW (ref 12.0–15.0)
MCH: 32.8 pg (ref 26.0–34.0)
MCHC: 31.9 g/dL (ref 30.0–36.0)
MCV: 103 fL — ABNORMAL HIGH (ref 78.0–100.0)
Platelets: 170 10*3/uL (ref 150–400)
RBC: 2.65 MIL/uL — ABNORMAL LOW (ref 3.87–5.11)
RDW: 15 % (ref 11.5–15.5)
WBC: 4.7 10*3/uL (ref 4.0–10.5)

## 2013-08-24 MED ORDER — ALBUTEROL SULFATE (2.5 MG/3ML) 0.083% IN NEBU
2.5000 mg | INHALATION_SOLUTION | Freq: Three times a day (TID) | RESPIRATORY_TRACT | Status: DC
  Administered 2013-08-24 – 2013-08-30 (×18): 2.5 mg via RESPIRATORY_TRACT
  Filled 2013-08-24 (×19): qty 3

## 2013-08-24 MED ORDER — DILTIAZEM HCL 60 MG PO TABS
60.0000 mg | ORAL_TABLET | Freq: Two times a day (BID) | ORAL | Status: DC
  Administered 2013-08-24 – 2013-08-30 (×13): 60 mg via ORAL
  Filled 2013-08-24 (×13): qty 1

## 2013-08-24 MED ORDER — METOPROLOL TARTRATE 25 MG PO TABS
25.0000 mg | ORAL_TABLET | Freq: Two times a day (BID) | ORAL | Status: DC
  Administered 2013-08-24 – 2013-08-30 (×13): 25 mg via ORAL
  Filled 2013-08-24 (×13): qty 1

## 2013-08-24 NOTE — Consult Note (Signed)
CARDIOLOGY CONSULT NOTE   Patient ID: Catherine [name] MRN: [id number] DOB/AGE: 27-Aug-1923 80 y.o.  Admit Date: 08/23/2013 Referring Physician: PTH Primary Physician: Asencion Noble, MD Consulting Cardiologist: [name] MD Primary Cardiologist: [name] MD Reason for Consultation: Acute on Chronic Diastolic CHF  Clinical Summary Catherine [name] is a 80 y.o.femalewith multiple admissions for decompensated diastolic CHF, presents again with same symptoms of LEE, weight gain, and Pro-BNP of 11, 032, hyperkalemia (K+ 6.1), renal failure with creatinine of 3.06 and anemia with Hgb of 8.7.. She was recently discharge from [facility] on 08/14/2013 after treatment of CHF decompensation. She is a resident of [facility]. She has a chronic indwelling catheter with frequent UTI's.        She states that symptoms come on gradually over a week, but she is a difficult historian due to debilitated and acute illness.  CXR was negative for pulmonary edema or CHF. She had been treated with IV lasix 40 mg in ER. On last admission she was sent home on demedex at higher dose of 40 mg daily with dry wt of 169 lbs. On admission wt of 171 lbs. Uncertain accuracy of this. She is now on IV lasix 40 mg BID and has diuresed 1, 456 cc since admission with improvement in hyperkalemia to 5.5.   She was found to have hemoccult positive stools and GI has now become involved.        Other history includes  Atrial fibrillation on Xarelto, COPD, Type II diabetes, pulmonary hypertension, Aov Stenosis, Epidermolysis bullosa.    Allergies  Allergen Reactions  . Amoxicillin-Pot Clavulanate Diarrhea    Medications Scheduled Medications: . albuterol  2.5 mg Nebulization TID  . atorvastatin  10 mg Oral QHS  . calcitRIOL  0.25 mcg Oral Daily  . calcium gluconate  1 g Intravenous Once  . cholecalciferol  2,000 Units Oral Daily  . citalopram  20 mg Oral Daily  . diltiazem  60 mg Oral Q12H  . ferrous sulfate  325  mg Oral Q breakfast  . fluocinonide cream  1 application Topical Daily  . furosemide  40 mg Intravenous BID  . insulin aspart  0-15 Units Subcutaneous TID WC  . insulin aspart  0-5 Units Subcutaneous QHS  . insulin glargine  6 Units Subcutaneous QHS  . metoprolol tartrate  25 mg Oral BID  . mirtazapine  7.5 mg Oral QHS  . pantoprazole  40 mg Oral Daily  . sodium chloride  3 mL Intravenous Q12H  . triamcinolone cream  1 application Topical Daily     PRN Medications:  sodium chloride, acetaminophen, acetaminophen, albuterol, clonazePAM, ondansetron (ZOFRAN) IV, ondansetron, polyethylene glycol, sodium chloride   Past Medical History  Diagnosis Date  . Diastolic heart failure     LVEF 60%  . Type 2 diabetes mellitus   . Essential hypertension, benign   . COPD (chronic obstructive pulmonary disease)     Home O2  . Vertigo   . Breast cancer   . Stroke      Patient denies  . Skin disorder     Epidermolysis bullosa.  . Pulmonary hypertension   . Multiple thyroid nodules   . Anemia   . Aortic stenosis   . Monoclonal gammopathy   . Diverticulosis   . Squamous cell carcinoma   . Osteoporosis   . CKD (chronic kidney disease) stage 4, GFR 15-29 ml/min   . Atrial fibrillation   . Mitral regurgitation     Moderate  with severe left atrial enlargement    Past Surgical History  Procedure Laterality Date  . Cholecystectomy    . Hemorrhoid surgery    . Abdominal hysterectomy  1991    Complete hysterectomy for cancer  . Mastectomy  2005    Left breast, tamoxifin  . Goiter    . Hip fracture surgery  2010    Right hip replacement  . Hip fracture surgery  2006    Left hip replacement  . Colonoscopy  ?  Marland Kitchen Cataract extraction, bilateral      Family History  Problem Relation Age of Onset  . Colon cancer Neg Hx   . Heart attack Son     Age 10s, suspected MI  . Diabetes Mother     Social History Catherine [name] reports that she has never smoked. She has never used smokeless  tobacco. Catherine [name] reports that she does not drink alcohol.  Review of Systems Otherwise reviewed and negative except as outlined.  Physical Examination Blood pressure 102/57, pulse 86, temperature 97.3 F (36.3 C), temperature source Oral, resp. rate 17, height 5\' 3"  (1.6 m), weight 171 lb 15.3 oz (78 kg), SpO2 98.00%.  Intake/Output Summary (Last 24 hours) at 08/24/13 1106 Last data filed at 08/24/13 0900  Gross per 24 hour  Intake    804 ml  Output   1900 ml  Net  -1096 ml    Telemetry: Atrial fibrillation rates 100's,   GEN:Ill appearing, lethargic, frail. HEENT: Conjunctiva and lids normal, oropharynx clear with moist mucosa. Neck: Supple, no elevated JVP or carotid bruits, no thyromegaly. Lungs: Clear to auscultation no wheezes, diminished in the bases. Cardiac: Iregular rate and rhythm, 2/6 systolic murmur Abdomen: Soft, nontender, no hepatomegaly, bowel sounds present, no guarding or rebound. Extremities: 2+ pitting edema, distal pulses diminished. Skin: Warm and dry. Thickened with sores, not bleeding. Musculoskeletal: No kyphosis. Neuropsychiatric: Lethargic but is responsive.   Prior Cardiac Testing/Procedures 1. Echocardiogram Left ventricle: The cavity size was normal. Wall thickness was increased in a pattern of mild LVH. Systolic function was normal. The estimated ejection fraction was in the range of 55% to 60%. Wall motion was normal; there were no regional wall motion abnormalities. - Aortic valve: Mildly to moderately calcified annulus. Mildly thickened leaflets. Valve area: 1.55cm^2(VTI). - Mitral valve: Mildly calcified annulus. Moderately thickened leaflets . Moderate regurgitation. The regurgitant jet is eccentric and travels posteriorally along the left atrial wall, this may lead to underestimation of severity. The MR vena contracta is 0.5 cm . - Left atrium: The atrium was severely dilated. - Right ventricle: The interventricular septum is  flattened in diastolic suggestive of RV volume overload. The cavity size was severely dilated. Systolic function was mildly to moderately reduced. RV TAPSE is 1.3 cm. - Right atrium: The atrium was severely dilated. - Tricuspid valve: Severe regurgitation. The regurgitation is caused by tricuspid anular dilatation and poor leaflet coaptation. - Pulmonary arteries: Systolic pressure was moderately to severely increased. PA peak pressure: 100mm Hg (S). - Systemic veins: There is systolic hepatic vein flow reversal suggestive of severe TR. - Inferior vena cava: The vessel was dilated; the respirophasic diameter changes were blunted (< 50%); findings are consistent with elevated central venous pressure. - Pericardium, extracardiac: There is a large left pleural effusion.   Lab Results  Basic Metabolic Panel:  Recent Labs Lab 08/23/13 1202 08/23/13 2217 08/24/13 0448  NA 136*  --  139  K 6.6* 6.1* 5.5*  CL 96  --  99  CO2 27  --  27  GLUCOSE 261*  --  206*  BUN 76*  --  81*  CREATININE 3.30*  --  3.06*  CALCIUM 10.0  --  9.7    CBC:  Recent Labs Lab 08/23/13 1202 08/24/13 0448  WBC 6.4 4.7  NEUTROABS 5.1  --   HGB 10.0* 8.7*  HCT 31.1* 27.3*  MCV 103.7* 103.0*  PLT 199 170     BNP:11,032.0    Radiology: Dg Chest Portable 1 View  08/23/2013   CLINICAL DATA:  Rectal bleeding and CHF.  EXAM: PORTABLE CHEST - 1 VIEW  COMPARISON:  08/19/2013  FINDINGS: Mild elevation of the right hemidiaphragm is unchanged. Heart size is upper limits of normal but unchanged. The thoracic aortic arch is heavily calcified. There are old right rib fractures. Mild haziness in both lungs have not significantly changed. No evidence for pulmonary edema. No focal airspace disease.  IMPRESSION: No acute chest abnormality.  Atherosclerotic disease.   Electronically Signed   By: Markus Daft M.D.   On: 08/23/2013 14:23     GUR:KYHCWC fibrillation with wide complex QRS.    Impression and  Recommendations  1. Acute on Chronic Diastolic CHF: Multifactorial with renal insufficiency, severe TR and MR, anemia, and atrial fib. Her dry wt may be much lower than previously documented as she is not eating as much now. She remains on IV lasix and has been on both lasix and torsemide with increasing doses to assist with fluid maintenance. However, on returning back to [facility] is easily decompensated.     Strongly recommend Hospice see her as her valvular disease and renal disease are worsening, with frequent admissions for decompensated CHF. Will continue diureses, but this likely to be a temporary fix, with continued recurrences.    2.Atrial fibrillation: Will stop Xarelto as she is continuing to have hemoccult positive stools with anemia. Heart rate is well controlled but may need to decrease rate at a later date.   3. Severe Valvular disease; She is not a surgical candidate due to multiple co-morbidities  4.GI Bleed: As stated. Stop Xarelto. Further recommendations per GI.Marland Kitchen           Signed: Phill Myron. Purcell Nails NP [facility] 08/24/2013, 11:06 AM Co-Sign MD

## 2013-08-24 NOTE — Progress Notes (Signed)
PT IS BEING TRANSFERRED TO TELEMETRY TO ROOM 306. PT IS ALERT AND ORIENTED. RT DISTAL FOREARM NSL PATENT. FOLEY CATH PATENT. O2 AT 3L/MIN VIA [place]. HR 80'S IN A FIB. BIL LE AND REDNESS WHICH IS WHELP LIKE.SKIN IS WARM AND DRY. TRANSFER REPORT TO GIVEN TO MARY ANN RN ON 300.

## 2013-08-24 NOTE — Clinical Social Work Psychosocial (Deleted)
Clinical Social Work Department BRIEF PSYCHOSOCIAL ASSESSMENT 08/24/2013  Patient:  Catherine [name], Catherine [name]     Account Number:  [id number]     Admit date:  08/23/2013  Clinical Social Worker:  Edwyna Shell, [place]  Date/Time:  08/24/2013 09:00 AM  Referred by:  CSW  Date Referred:  08/24/2013 Referred for  ALF Placement   Other Referral:   Interview type:  Patient Other interview type:   Also spoke w PT and ALF staff    PSYCHOSOCIAL DATA Living Status:  FACILITY Admitted from facility:  [facility] Level of care:  [place] Primary support name:  Murlean Hark Primary support relationship to patient:  CHILD, ADULT Degree of support available:    CURRENT CONCERNS Current Concerns  Post-Acute Placement   Other Concerns:    SOCIAL WORK ASSESSMENT / PLAN CSW met w patient at bedside, patient tired but oriented x4.  Confirms she is currently living at [facility], "I would rather be home but I can't be there", is agreeable to returning to ALF.  Very fatigued, so CSW limited assessment for today to allow patient to rest.    CSW spoke w PT, per PT patient does not want to go to SNF, says she is content to remain basically bed and wheelchair bound.  Has permanent Foley so does not need to ambulate to use bathroom , says facility is willing to assist w ADLs and transfers.    CSW spoke w Lynelle Smoke, [place].  Patient has been resident for some time, they are concerned that they may not be able to meet patient's needs.  Expressed concern that patient has gained weight rapidly and has frequent rehospitalizations for same issue.  Says ALF cannot provide IV Lasix and cannot provide PRN dosing of oral Lasix. Wants to make sure patient is getting rehab as needed, and are willing to evaluate patient in 1 - 2 days and assess when patient is better.  Willing to take patient back if they can meet patient's needs.   Assessment/plan status:  Psychosocial  Support/Ongoing Assessment of Needs Other assessment/ plan:   Information/referral to community resources:   Return to [facility] if possible    PATIENT'S/FAMILY'S RESPONSE TO PLAN OF CARE: Patient pleasant and agreeable to return to [facility], misses being in her own home but understands that she cannot return at present.        Edwyna Shell, LCSW Clinical Social Worker [phone])

## 2013-08-24 NOTE — Progress Notes (Signed)
PT Cancellation Note  Patient Details Name: Catherine [name] MRN: [id number] DOB: 19-Jan-1924   Cancelled Treatment:    Reason Eval/Treat Not Completed: Fatigue/lethargy limiting ability to participate An attempt was made to see pt for evaluation.  She has just been admitted last night and is still very edemetous.  She states that she is only minimally ambulatory at [facility].  She has a permanent Foley catheter and therefore does not have to get up frequently to go to the bathroom any more.  She stays in her w/c most of the time and walks only occasionally with a walker.  She needs assist to transfer bed to chair.  She is NOT insterested in going to SNF at d/c.  She is content with her current functional level but would be agreeable to PT in the hospital.  She does not feel up to it today.   Sable Feil 08/24/2013, 9:39 AM

## 2013-08-24 NOTE — Consult Note (Signed)
Referring Provider: Dr. Roderic Palau Primary Care Physician:  Asencion Noble, MD Primary Gastroenterologist:  Dr. Oneida Alar   Date of Admission: 08/23/13 Date of Consultation: 08/24/13  Reason for Consultation:  Heme positive stool on Xarelto  HPI:  Catherine [name] is an 80 year old female with multiple medical issues, admitted on 4/27 due to acute on chronic CHF. She has a history of anemia, with Hgb around 11 range earlier this year but has been trending down. Admitting Hgb 10, now 8.7 this morning. She is on Xarelto with history of afib. Found to be heme positive in the ED. Discussed with [name], who was taking care of patient yesterday. States she had a BM that was black, with bright red rectal bleeding "like a menstrual flow". Clots in toilet. States stool is chronically black on iron. Denies abdominal pain, N/V. No dysphagia, GERD. Good appetite. Ate french toast for breakfast. Denies use of NSAIDs or aspirin powders.   Believes she may have had a colonoscopy and EGD last year; she is unable to tell me where or who may have performed.   Past Medical History  Diagnosis Date  . Diastolic heart failure     LVEF 60%  . Type 2 diabetes mellitus   . Essential hypertension, benign   . COPD (chronic obstructive pulmonary disease)     Home O2  . Vertigo   . Breast cancer   . Stroke      Patient denies  . Skin disorder     Epidermolysis bullosa.  . Pulmonary hypertension   . Multiple thyroid nodules   . Anemia   . Aortic stenosis   . Monoclonal gammopathy   . Diverticulosis   . Squamous cell carcinoma   . Osteoporosis   . CKD (chronic kidney disease) stage 4, GFR 15-29 ml/min   . Atrial fibrillation   . Mitral regurgitation     Moderate with severe left atrial enlargement    Past Surgical History  Procedure Laterality Date  . Cholecystectomy    . Hemorrhoid surgery    . Abdominal hysterectomy  1991    Complete hysterectomy for cancer  . Mastectomy  2005    Left breast, tamoxifin   . Goiter    . Hip fracture surgery  2010    Right hip replacement  . Hip fracture surgery  2006    Left hip replacement  . Colonoscopy  ?  Marland Kitchen Cataract extraction, bilateral      Prior to Admission medications   Medication Sig Start Date End Date Taking? Authorizing Provider  ciprofloxacin (CIPRO) 250 MG tablet Take 250 mg by mouth 2 (two) times daily. Twice daily for 7 days 08/13/13  Yes Historical Provider, MD  diltiazem (DILACOR XR) 240 MG 24 hr capsule Take 240 mg by mouth daily.   Yes Historical Provider, MD  acetaminophen (TYLENOL) 325 MG tablet Take 650 mg by mouth every 6 (six) hours as needed for mild pain, fever or headache.     Historical Provider, MD  albuterol (PROVENTIL HFA;VENTOLIN HFA) 108 (90 BASE) MCG/ACT inhaler Inhale 2 puffs into the lungs every 4 (four) hours as needed for wheezing or shortness of breath.    Historical Provider, MD  atorvastatin (LIPITOR) 10 MG tablet Take 10 mg by mouth at bedtime.    Historical Provider, MD  calcitRIOL (ROCALTROL) 0.25 MCG capsule Take 1 capsule (0.25 mcg total) by mouth daily. 08/02/13   Asencion Noble, MD  cholecalciferol (VITAMIN D) 1000 UNITS tablet Take 2,000 Units by mouth daily.  Historical Provider, MD  citalopram (CELEXA) 20 MG tablet Take 20 mg by mouth daily.    Historical Provider, MD  clonazePAM (KLONOPIN) 0.5 MG tablet Take 0.5 mg by mouth 3 (three) times daily as needed for anxiety.    Historical Provider, MD  cyanocobalamin (,VITAMIN B-12,) 1000 MCG/ML injection Inject 1,000 mcg into the muscle every 30 (thirty) days.    Historical Provider, MD  ferrous sulfate 325 (65 FE) MG tablet Take 325 mg by mouth daily with breakfast.    Historical Provider, MD  fluocinonide cream (LIDEX) [id number] % Apply 1 application topically daily. Apply sparingly to itchy areas on trunk & extremeties    Historical Provider, MD  insulin glargine (LANTUS) 100 UNIT/ML injection Inject 0.06 mLs (6 Units total) into the skin at bedtime. 08/14/13   Asencion Noble,  MD  metoprolol (LOPRESSOR) 50 MG tablet Take 50 mg by mouth 2 (two) times daily.    Historical Provider, MD  mirtazapine (REMERON) 15 MG tablet Take 7.5 mg by mouth at bedtime.     Historical Provider, MD  Loma Boston (OYSTER CALCIUM) 500 MG TABS tablet Take 500 mg of elemental calcium by mouth 2 (two) times daily.    Historical Provider, MD  pantoprazole (PROTONIX) 40 MG tablet Take 40 mg by mouth daily.    Historical Provider, MD  polyethylene glycol (MIRALAX / GLYCOLAX) packet Take 17 g by mouth daily as needed for mild constipation.    Historical Provider, MD  potassium chloride SA (K-DUR,KLOR-CON) 20 MEQ tablet Take 40 mEq by mouth daily.     Historical Provider, MD  Rivaroxaban (XARELTO) 15 MG TABS tablet Take 15 mg by mouth daily with supper.    Historical Provider, MD  torsemide (DEMADEX) 20 MG tablet Take 2 tablets (40 mg total) by mouth daily. 08/14/13   Asencion Noble, MD  triamcinolone cream (KENALOG) 0.1 % Apply 1 application topically daily. Apply to lower extremities.    Historical Provider, MD    Current Facility-Administered Medications  Medication Dose Route Frequency Provider Last Rate Last Dose  . 0.9 %  sodium chloride infusion  250 mL Intravenous PRN Kathie Dike, MD      . acetaminophen (TYLENOL) tablet 650 mg  650 mg Oral Q6H PRN Kathie Dike, MD       Or  . acetaminophen (TYLENOL) suppository 650 mg  650 mg Rectal Q6H PRN Kathie Dike, MD      . albuterol (PROVENTIL) (2.5 MG/3ML) 0.083% nebulizer solution 2.5 mg  2.5 mg Nebulization Q6H Kathie Dike, MD   2.5 mg at 08/24/13 [id number]  . albuterol (PROVENTIL) (2.5 MG/3ML) 0.083% nebulizer solution 2.5 mg  2.5 mg Nebulization Q2H PRN Kathie Dike, MD      . atorvastatin (LIPITOR) tablet 10 mg  10 mg Oral QHS Kathie Dike, MD   10 mg at 08/23/13 2105  . calcitRIOL (ROCALTROL) capsule 0.25 mcg  0.25 mcg Oral Daily Kathie Dike, MD      . calcium gluconate 1 g in sodium chloride 0.9 % 100 mL IVPB  1 g Intravenous Once  Kathie Dike, MD      . cholecalciferol (VITAMIN D) tablet 2,000 Units  2,000 Units Oral Daily Kathie Dike, MD   2,000 Units at 08/23/13 2105  . citalopram (CELEXA) tablet 20 mg  20 mg Oral Daily Kathie Dike, MD   20 mg at 08/23/13 2105  . clonazePAM (KLONOPIN) tablet 0.5 mg  0.5 mg Oral TID PRN Kathie Dike, MD   0.5 mg at 08/23/13  2105  . diltiazem (CARDIZEM) tablet 60 mg  60 mg Oral Q12H Asencion Noble, MD      . ferrous sulfate tablet 325 mg  325 mg Oral Q breakfast Kathie Dike, MD   325 mg at 08/24/13 0805  . fluocinonide cream (LIDEX) 6.76 % 1 application  1 application Topical Daily Kathie Dike, MD      . furosemide (LASIX) injection 40 mg  40 mg Intravenous BID Kathie Dike, MD   40 mg at 08/24/13 0805  . insulin aspart (novoLOG) injection 0-15 Units  0-15 Units Subcutaneous TID WC Kathie Dike, MD   3 Units at 08/24/13 0805  . insulin aspart (novoLOG) injection 0-5 Units  0-5 Units Subcutaneous QHS Kathie Dike, MD   2 Units at 08/23/13 2151  . insulin glargine (LANTUS) injection 6 Units  6 Units Subcutaneous QHS Kathie Dike, MD   6 Units at 08/23/13 2151  . metoprolol tartrate (LOPRESSOR) tablet 25 mg  25 mg Oral BID Asencion Noble, MD      . mirtazapine (REMERON) tablet 7.5 mg  7.5 mg Oral QHS Kathie Dike, MD   7.5 mg at 08/23/13 2105  . ondansetron (ZOFRAN) tablet 4 mg  4 mg Oral Q6H PRN Kathie Dike, MD       Or  . ondansetron (ZOFRAN) injection 4 mg  4 mg Intravenous Q6H PRN Kathie Dike, MD      . pantoprazole (PROTONIX) EC tablet 40 mg  40 mg Oral Daily Kathie Dike, MD   40 mg at 08/23/13 2105  . polyethylene glycol (MIRALAX / GLYCOLAX) packet 17 g  17 g Oral Daily PRN Kathie Dike, MD      . sodium chloride 0.9 % injection 3 mL  3 mL Intravenous Q12H Kathie Dike, MD   3 mL at 08/23/13 2200  . sodium chloride 0.9 % injection 3 mL  3 mL Intravenous PRN Kathie Dike, MD   3 mL at 08/24/13 0807  . triamcinolone cream (KENALOG) 0.1 % 1 application  1  application Topical Daily Kathie Dike, MD        Allergies as of 08/23/2013 - Review Complete 08/23/2013  Allergen Reaction Noted  . Amoxicillin-pot clavulanate Diarrhea 12/13/2010    Family History  Problem Relation Age of Onset  . Colon cancer Neg Hx   . Heart attack Son     Age 20s, suspected MI  . Diabetes Mother     History   Social History  . Marital Status: Widowed    Spouse Name: N/A    Number of Children: 1  . Years of Education: N/A   Occupational History  .     Social History Main Topics  . Smoking status: Never Smoker   . Smokeless tobacco: Never Used  . Alcohol Use: No  . Drug Use: No  . Sexual Activity: No   Other Topics Concern  . Not on file   Social History Narrative  . No narrative on file    Review of Systems: As mentioned in HPI.   Physical Exam: Vital signs in last 24 hours: Temp:  [96.2 F (35.7 C)-97.5 F (36.4 C)] 97.3 F (36.3 C) (04/28 0800) Pulse Rate:  [66-82] 82 (04/28 0800) Resp:  [0-28] 19 (04/28 0800) BP: (75-113)/(51-91) 113/64 mmHg (04/28 0800) SpO2:  [95 %-100 %] 99 % (04/28 0800) Weight:  [171 lb 8.3 oz (77.8 kg)-171 lb 15.3 oz (78 kg)] 171 lb 15.3 oz (78 kg) (04/28 0500) Last BM Date: 08/22/13 General:  Alert and oriented X 3 Head:  Normocephalic and atraumatic. Eyes:  Sclera clear, no icterus.   Conjunctiva pink. Ears:  Mild HOH Nose:  No deformity, discharge,  or lesions. Mouth:  No deformity or lesions Lungs:  Clear throughout to auscultation.    Heart:  S1 S2 present, irregular, 3-8/7 systolic murmur Abdomen:  +BS, distended, no TTP, significant anasarca Rectal:  Deferred until time of colonoscopy.   Extremities:  Right arm with 2+ pitting edema, ?IV infiltration, lower extremities with 2+ pitting edema, chronic venous stasis changes, draining/weeping lesions  Neurologic:  Alert and  oriented x4 Psych:  Alert and cooperative. Normal mood and affect.  Intake/Output from previous day: 04/27 0701 - 04/28  0700 In: 444 [P.O.:444] Out: 1900 [Urine:1900] Intake/Output this shift:    Lab Results:  Recent Labs  08/23/13 1202 08/24/13 0448  WBC 6.4 4.7  HGB 10.0* 8.7*  HCT 31.1* 27.3*  PLT 199 170   BMET  Recent Labs  08/23/13 1202 08/23/13 2217 08/24/13 0448  NA 136*  --  139  K 6.6* 6.1* 5.5*  CL 96  --  99  CO2 27  --  27  GLUCOSE 261*  --  206*  BUN 76*  --  81*  CREATININE 3.30*  --  3.06*  CALCIUM 10.0  --  9.7    Studies/Results: Dg Chest Portable 1 View  08/23/2013   CLINICAL DATA:  Rectal bleeding and CHF.  EXAM: PORTABLE CHEST - 1 VIEW  COMPARISON:  08/19/2013  FINDINGS: Mild elevation of the right hemidiaphragm is unchanged. Heart size is upper limits of normal but unchanged. The thoracic aortic arch is heavily calcified. There are old right rib fractures. Mild haziness in both lungs have not significantly changed. No evidence for pulmonary edema. No focal airspace disease.  IMPRESSION: No acute chest abnormality.  Atherosclerotic disease.   Electronically Signed   By: Markus Daft M.D.   On: 08/23/2013 14:23    Impression: 80 year old female admitted with acute on chronic heart failure, found to have drifting Hgb and heme positive stool in the setting of Xarelto. Per [facility], reports of bright red blood per rectum with clots yesterday, black stool in setting of iron. Unclear where or if she truly has had a colonoscopy/EGD, but she believes she may have had an evaluation last year. I am unable to verify this. No further signs of overt GI bleeding since admission. Xarelto is on hold. Significant anasarca noted on exam. Likely multifactorial in setting of heart failure, possible malnutrition (will recheck albumin). No known liver disease. Feels improved since admission. Colonoscopy/EGD would be beneficial to further sort out. Would need to be stable from respiratory/cardiac, electrolyte standpoint. Unclear if she would tolerate prep for colonoscopy. As of note, patient ate  breakfast today and Xarelto is on hold since admission. Could potentially proceed with procedure(s) on 4/29 if clinically stable. Will reassess in the morning.   Plan: Xarelto on hold PPI daily Monitor for overt signs of GI bleeding Monitor H/H Check albumin status Hyperkalemia: per attending Consider TCS/EGD 4/29   Orvil Feil, ANP-BC [facility]    LOS: 1 day    08/24/2013, 8:57 AM

## 2013-08-24 NOTE — Consult Note (Signed)
REVIEWED. AGREE. 

## 2013-08-24 NOTE — Consult Note (Signed)
The patient was seen and examined, and I agree with the assessment and plan as documented above, with modifications as noted below. 80 yr old woman with multiple hospitalizations for acute on chronic right heart failure/diastolic heart failure, in the setting of severe valvular heart disease with what is likely severe mitral regurgitation and severe tricuspid regurgitation. She also has atrial fibrillation and is on Xarelto, and has worsening anemia (10-->8.7) with heme+ stools. She was discharged on torsemide 40 mg daily. She has been started on IV Lasix and has put out nearly 1.5 liters since admission. She also has concomitant stage 4 CKD with cardiorenal syndrome. Given her inoperable valvular heart disease (high risk, poor surgical candidate) leading to multiple hospitalizations and given her advanced age, my recommendation would be hospice. This was also recommended by Dr. Domenic Polite during her hospitalization earlier this month. I had a long discussion with the patient and she is agreeable to considering this. I have called Claiborne Billings at [facility], and they will be coming to evaluate her. For the time being, continue IV Lasix at present dosage. Will d/c Xarelto given probable GI bleed. HR is controlled.

## 2013-08-25 DIAGNOSIS — K5730 Diverticulosis of large intestine without perforation or abscess without bleeding: Secondary | ICD-10-CM

## 2013-08-25 LAB — BASIC METABOLIC PANEL
BUN: 77 mg/dL — AB (ref 6–23)
CALCIUM: 9.6 mg/dL (ref 8.4–10.5)
CO2: 29 meq/L (ref 19–32)
Chloride: 99 mEq/L (ref 96–112)
Creatinine, Ser: 2.99 mg/dL — ABNORMAL HIGH (ref 0.50–1.10)
GFR calc Af Amer: 15 mL/min — ABNORMAL LOW (ref >90)
GFR calc non Af Amer: 13 mL/min — ABNORMAL LOW (ref >90)
GLUCOSE: 117 mg/dL — AB (ref 70–99)
Potassium: 4.3 mEq/L (ref 3.7–5.3)
SODIUM: 140 meq/L (ref 137–147)

## 2013-08-25 LAB — GLUCOSE, CAPILLARY
GLUCOSE-CAPILLARY: 107 mg/dL — AB (ref 70–99)
Glucose-Capillary: 135 mg/dL — ABNORMAL HIGH (ref 70–99)
Glucose-Capillary: 106 mg/dL — ABNORMAL HIGH (ref 70–99)
Glucose-Capillary: 120 mg/dL — ABNORMAL HIGH (ref 70–99)
Glucose-Capillary: 180 mg/dL — ABNORMAL HIGH (ref 70–99)

## 2013-08-25 LAB — ALBUMIN: ALBUMIN: 2.8 g/dL — AB (ref 3.5–5.2)

## 2013-08-25 LAB — CBC
HEMATOCRIT: 27.3 % — AB (ref 36.0–46.0)
Hemoglobin: 8.7 g/dL — ABNORMAL LOW (ref 12.0–15.0)
MCH: 32.5 pg (ref 26.0–34.0)
MCHC: 31.9 g/dL (ref 30.0–36.0)
MCV: 101.9 fL — ABNORMAL HIGH (ref 78.0–100.0)
Platelets: 178 10*3/uL (ref 150–400)
RBC: 2.68 MIL/uL — AB (ref 3.87–5.11)
RDW: 14.9 % (ref 11.5–15.5)
WBC: 4.2 10*3/uL (ref 4.0–10.5)

## 2013-08-25 NOTE — Evaluation (Signed)
Physical Therapy Evaluation Patient Details Name: Catherine [name] MRN: [id number] DOB: 05-21-1923 Today's Date: 08/25/2013   History of Present Illness  [name] is a 80 y.o. female a history of diastolic congestive heart failure, diabetes, A. fib, hypertension presents to the emergency department with a 3 to four-day history of worsening lower extremity edema and weight gain progressing to shortness of breath yesterday. Initial workup in the emergency room reveals a urinary tract infection and acute on chronic diastolic heart failure with acute on chronic renal failure.  Clinical Impression  Pt is an 80 yo female who was able to ambulate 30 feet two weeks ago and is now wanting to sit down after three.  Pt was to go to [facility] with [facility] but she states no therapist has been working with her.  Pt will benefit from skilled PT to maximize her functional ability.      Follow Up Recommendations Home health PT;SNF (Pt was to get [facility] but states no therapy came to [facility])    Equipment Recommendations  None recommended by PT    Recommendations for Other Services   none    Precautions / Restrictions Precautions Precautions: None Restrictions Weight Bearing Restrictions: No      Mobility  Bed Mobility Overal bed mobility: Needs Assistance Bed Mobility: Supine to Sit     Supine to sit: Min guard        Transfers Overall transfer level: Modified independent Equipment used: Rolling walker (2 wheeled) Transfers: Sit to/from Stand Sit to Stand: Supervision            Ambulation/Gait Ambulation/Gait assistance: Modified independent (Device/Increase time);Supervision Ambulation Distance (Feet): 3 Feet Assistive device: Rolling walker (2 wheeled) Gait Pattern/deviations: Trunk flexed   Gait velocity interpretation: at or above normal speed for age/gender                                           Home Living Family/patient expects to be discharged to::  [name] - 2 wheels;Wheelchair - manual      Prior Function Level of Independence: Independent with assistive device(s)   Gait / Transfers Assistance Needed: ambulates with a walker since she has been at [place] she has assist getting to the bathroom and back.  ADL's / Homemaking Assistance Needed: assistance with bathing and all household ADLs  Comments: for dressing and grooming provided by [facility]        Extremity/Trunk Assessment    wfl           Lower Extremity Assessment: Overall WFL for tasks assessed         Communication   Communication: HOH  Cognition Arousal/Alertness: Awake/alert Behavior During Therapy: WFL for tasks assessed/performed Overall Cognitive Status: Within Functional Limits for tasks assessed                         Exercises General Exercises - Lower Extremity Ankle Circles/Pumps: 10 reps Quad Sets: 10 reps Gluteal Sets: 10 reps Heel Slides: 10 reps Hip ABduction/ADduction: 10 reps Straight Leg Raises: 5 reps      Assessment/Plan    PT Assessment Patient needs continued PT services  PT Diagnosis Difficulty walking;Generalized weakness   PT Problem List Decreased activity tolerance;Decreased mobility  PT Treatment  Interventions Gait training;Therapeutic exercise   PT Goals (Current goals can be found in the Care Plan section)      Frequency Min 3X/week           End of Session Equipment Utilized During Treatment: Gait belt Activity Tolerance: Patient tolerated treatment well Patient left: in chair;with chair alarm set;with call bell/phone within reach Nurse Communication: Mobility status         Time: 8366-2947 PT Time Calculation (min): 44 min   Charges:   PT Evaluation $Initial PT Evaluation Tier I: 1 Procedure          Leeroy Cha 08/25/2013, 9:58 AM

## 2013-08-25 NOTE — Progress Notes (Signed)
NAME:  Catherine [name], CARRELL                   ACCOUNT NO.:  [id number]  MEDICAL RECORD NO.:  [id number]  LOCATION:  A306                          FACILITY:  [facility]  PHYSICIAN:  Paula Compton. Willey Blade, MD       DATE OF BIRTH:  11/22/1923  DATE OF PROCEDURE:  08/24/2013 DATE OF DISCHARGE:                                PROGRESS NOTE   Mrs. Bogucki was admitted yesterday after presenting with increased edema, bright red rectal bleeding and hyperkalemia.  She was discharged from the hospital approximately a week ago, after treatment of acute on chronic diastolic heart failure.  Her Demadex dose had been increased, however she continued to gain fluid weight.  She was hyperkalemic at 6.6 on admission and was treated with Kayexalate, subsequent potassiums had been 6.1 and 5.5.  She had bright red rectal bleeding in the setting of anticoagulation with Xarelto for chronic atrial fibrillation.  Her hemoglobin has dropped from 10 to 8.7.  She has had low normal blood pressure.  She denies any increased shortness of breath now.  OBJECTIVE:  VITAL SIGNS:  Temperature 97.1, pulse 81, respirations 18, blood pressure 94/54, oxygen saturation 97% on 4 L. LUNGS:  Clear. HEART:  Irregular irregular with a grade 2 systolic murmur.  ABDOMEN: Edematous, nontender. EXTREMITIES:  Edematous to thighs in the legs.  The right arm is more swollen than the left.  IMPRESSION AND PLAN: 1. Hyperkalemia, improved.  Renal function is worse.  No potassium     supplements at this time. 2. Acute on chronic diastolic heart failure.  Continue IV Lasix. 3. Worsening kidney failure.  BUN and creatinine are up to 81 and     3.06. 4. Diabetes.  Continue low-dose Lantus with sliding scale NovoLog.     Glucose is 206 this morning. 5. Rectal bleeding/anemia.  Xarelto has been stopped. 6. Chronic atrial fibrillation, rate is presently controlled, will     need to restart diltiazem and metoprolol. 7. Monoclonal gammopathy of undetermined  significance.     Paula Compton. Willey Blade, MD     ROF/MEDQ  D:  08/24/2013  T:  08/25/2013  Job:  [id number]

## 2013-08-25 NOTE — Clinical Social Work Psychosocial (Signed)
Clinical Social Work Department BRIEF PSYCHOSOCIAL ASSESSMENT 08/25/2013  Patient:  Catherine [name], Catherine [name]     Account Number:  [id number]     Admit date:  08/23/2013  Clinical Social Worker:  Edwyna Shell, [place]  Date/Time:  08/24/2013 09:00 AM  Referred by:  CSW  Date Referred:  08/24/2013 Referred for  ALF Placement   Other Referral:   Interview type:  Patient Other interview type:   Also spoke w PT and ALF staff    PSYCHOSOCIAL DATA Living Status:  FACILITY Admitted from facility:  [facility] Level of care:  Assisted Living Primary support name:  Levada Dy [place] Primary support relationship to patient:  FAMILY Degree of support available:   Patient has supportive family, POA lives in [place] but is involved by phone.  Cannot return  home or live w family, has been at [facility] for approx one year.    CURRENT CONCERNS Current Concerns  Post-Acute Placement   Other Concerns:    SOCIAL WORK ASSESSMENT / PLAN CSW met w patient at bedside, patient tired but oriented x4.  Confirms she is currently living at [facility], "I would rather be home but I can't be there", is agreeable to returning to ALF.  Very fatigued, so CSW limited assessment for today to allow patient to rest.    CSW spoke w PT, per PT patient does not want to go to SNF, says she is content to remain basically bed and wheelchair bound.  Has permanent Foley so does not need to ambulate to use bathroom , says facility is willing to assist w ADLs and transfers.    CSW spoke w Lynelle Smoke, [facility].  Patient has been resident for some time, they are concerned that they may not be able to meet patient's needs.  Expressed concern that patient has gained weight rapidly and has frequent rehospitalizations for same issue.  Says ALF cannot provide IV Lasix and cannot provide PRN dosing of oral Lasix. Wants to make sure patient is getting rehab as needed, and are willing to evaluate  patient in 1 - 2 days and assess when patient is better.  Willing to take patient back if they can meet patient's needs.   Assessment/plan status:  Psychosocial Support/Ongoing Assessment of Needs Other assessment/ plan:   Information/referral to community resources:   Return to [facility] if possible    PATIENT'S/FAMILY'S RESPONSE TO PLAN OF CARE: Patient pleasant and agreeable to return to [facility], misses being in her own home but understands that she cannot return at present.        Edwyna Shell, LCSW Clinical Social Worker [phone])

## 2013-08-25 NOTE — Clinical Social Work Note (Signed)
CSW spoke w patient's healthcare POA, Emi Belfast, per patient request.  Explained discharge planning options, POA want to honor patient's wishes, hopes patient can return to [facility].  Appreciated contact w CSW and updated information.  Edwyna Shell, LCSW Clinical Social Worker [phone])

## 2013-08-25 NOTE — Progress Notes (Signed)
NAME:  [name], Catherine                        ACCOUNT NO.:  MEDICAL RECORD NO.:  LOCATION:                                 FACILITY:  PHYSICIAN:  Paula Compton. Willey Blade, MD            DATE OF BIRTH:  DATE OF PROCEDURE:  08/25/2013 DATE OF DISCHARGE:                                PROGRESS NOTE   SUBJECTIVE:  Ms. Catherine [name] denies shortness of breath.  She still feels swelling in her extremities.  She denies any further bleeding.  She has diuresed 2550 mL.  I am unable to find the weight today. Oxygen saturations 96% on 3 L.  OBJECTIVE:  VITAL SIGNS:  Temperature 97.8, pulse 92, blood pressure 110/68. LUNGS:  Clear. HEART:  Irregular with a grade 2 systolic murmur. ABDOMEN:  Mildly distended. EXTREMITIES:  Edema improved.  IMPRESSION/PLAN: 1. Acute on chronic diastolic heart failure.  Continue IV Lasix. 2. Hyperkalemia, resolved.  Serum potassium is dropped to 4.3. 3. Chronic renal failure.  BUN and creatinine are 77 and 2.99 which     are slightly improved.  4.  GI bleed.  Hemoglobin unchanged at 8.7.     Appreciate GI consult. 4. Diabetes.  Continue low-dose Lantus.  Fasting glucose is 117. 5. Pulmonary hypertension. 6. Mitral regurgitation.  We have discussed hospice.  She is agreeable to this approach.     Paula Compton. Willey Blade, MD     ROF/MEDQ  D:  08/25/2013  T:  08/25/2013  Job:  [id number]

## 2013-08-25 NOTE — Progress Notes (Addendum)
Subjective: Denies abdominal pain, N/V, hematochezia, melena.   Objective: Vital signs in last 24 hours: Temp:  [97.2 F (36.2 C)-97.8 F (36.6 C)] 97.8 F (36.6 C) (04/29 0450) Pulse Rate:  [85-92] 90 (04/29 0812) Resp:  [8-20] 18 (04/29 0812) BP: (95-110)/(52-68) 110/68 mmHg (04/29 0450) SpO2:  [92 %-100 %] 96 % (04/29 0812) Last BM Date: 08/23/13 General:   Alert and oriented, pleasant Abdomen:  Bowel sounds present, soft, decreased anasarca non-tender. Umbilical hernia noted. Rectal: small external hemorrhoid tags, internal exam without mass but large amount of soft stool in rectal vault, brown, no melena or gross blood appreciated.  Extremities:  Less edematous, chronic venous stasis changes, dressings to lesions Neurologic:  Alert and  oriented x4  Intake/Output from previous day: 04/28 0701 - 04/29 0700 In: 960 [P.O.:960] Out: 2550 [Urine:2550] Intake/Output this shift:    Lab Results:  Recent Labs  08/23/13 1202 08/24/13 0448 08/25/13 0502  WBC 6.4 4.7 4.2  HGB 10.0* 8.7* 8.7*  HCT 31.1* 27.3* 27.3*  PLT 199 170 178   BMET  Recent Labs  08/23/13 1202 08/23/13 2217 08/24/13 0448 08/25/13 0502  NA 136*  --  139 140  K 6.6* 6.1* 5.5* 4.3  CL 96  --  99 99  CO2 27  --  27 29  GLUCOSE 261*  --  206* 117*  BUN 76*  --  81* 77*  CREATININE 3.30*  --  3.06* 2.99*  CALCIUM 10.0  --  9.7 9.6     Studies/Results: Dg Chest Portable 1 View  08/23/2013   CLINICAL DATA:  Rectal bleeding and CHF.  EXAM: PORTABLE CHEST - 1 VIEW  COMPARISON:  08/19/2013  FINDINGS: Mild elevation of the right hemidiaphragm is unchanged. Heart size is upper limits of normal but unchanged. The thoracic aortic arch is heavily calcified. There are old right rib fractures. Mild haziness in both lungs have not significantly changed. No evidence for pulmonary edema. No focal airspace disease.  IMPRESSION: No acute chest abnormality.  Atherosclerotic disease.   Electronically Signed    By: Markus Daft M.D.   On: 08/23/2013 14:23    Assessment: 80 year old female admitted with acute on chronic heart failure, found to have drifting Hgb and heme positive stool in the setting of Xarelto. Per [facility], reports of bright red blood per rectum with clots prior to admission, black stool in setting of iron. Unclear where or if she truly has had a colonoscopy/EGD, but she believes she may have had an evaluation last year. I am unable to verify this. No further signs of overt GI bleeding since admission. Xarelto is on hold. Hgb stable.   Generalized anasarca improved. Likely multifactorial in setting of heart failure, possible malnutrition (will check albumin). Cardiology has recommended hospice care due to inoperable valvular heart disease and advanced age.   May not be able to tolerate colonoscopy prep; patient states she will do "whatever Dr. Willey Blade recommends". Will need to discuss with family, patient, and I will make contact with Dr. Willey Blade regarding possible GI procedures this admission. Earliest evaluation would be 4/30. Appears last dose was at [facility] on 4/26 at 5pm.    Plan: May have full liquids Continue to hold Xarelto Protonix daily Hospice care recommended per cardiology Discuss with Dr. Willey Blade plan of care   Orvil Feil, ANP-BC [facility]    LOS: 2 days    08/25/2013, 9:34 AM  Addendum: spoke with Dr. Willey Blade who states Xarelto will  remain on hold indefinitely. Hospice recommended. Will hold off on invasive procedure.  Orvil Feil, ANP-BC [facility]    Attending note:  Patient seen and examined. Chart reviewed. I do not believe this lady is fit for any further invasive studies including EGD/colonoscopy. I fully agree with cardiology's recommendations including hospice referral and discontinuation of anticoagulation therapy.

## 2013-08-25 NOTE — Clinical Social Work Note (Signed)
CSW spoke w MD and patient regarding discharge planning.  Patient wants to return to ALF, but says "I am tired of bouncing back and forth like a ping pong ball."  Says MD has spoken w her about hospice, became tearful when speaking of that.  CSW explained difficulty of getting her placed at SNF without clear recommendation for rehab from PT or other rehab need.  Spoke w Tammy at [facility], [place] will come assess this afternoon to determine if they can meet her care needs at facility.  May consider hospice at ALF involvement in order to provide additional support to patient and facility.  Per ALF, HH PT was coming out and treating patient, as was an Therapist, sports to address her wound care needs.  Patient has told PT at [facility] that she did not receive [facility].  Edwyna Shell, LCSW Clinical Social Worker [phone])

## 2013-08-25 NOTE — Progress Notes (Signed)
SUBJECTIVE: Pt is feeling better, with less leg swelling. Would like to stay close to [place], and likes [employer].     Intake/Output Summary (Last 24 hours) at 08/25/13 1026 Last data filed at 08/25/13 0452  Gross per 24 hour  Intake    480 ml  Output   2550 ml  Net  -2070 ml    Current Facility-Administered Medications  Medication Dose Route Frequency Provider Last Rate Last Dose  . 0.9 %  sodium chloride infusion  250 mL Intravenous PRN Kathie Dike, MD      . acetaminophen (TYLENOL) tablet 650 mg  650 mg Oral Q6H PRN Kathie Dike, MD       Or  . acetaminophen (TYLENOL) suppository 650 mg  650 mg Rectal Q6H PRN Kathie Dike, MD      . albuterol (PROVENTIL) (2.5 MG/3ML) 0.083% nebulizer solution 2.5 mg  2.5 mg Nebulization Q2H PRN Kathie Dike, MD      . albuterol (PROVENTIL) (2.5 MG/3ML) 0.083% nebulizer solution 2.5 mg  2.5 mg Nebulization TID Asencion Noble, MD   2.5 mg at 08/25/13 0700  . calcium gluconate 1 g in sodium chloride 0.9 % 100 mL IVPB  1 g Intravenous Once Kathie Dike, MD      . citalopram (CELEXA) tablet 20 mg  20 mg Oral Daily Kathie Dike, MD   20 mg at 08/25/13 6759  . clonazePAM (KLONOPIN) tablet 0.5 mg  0.5 mg Oral TID PRN Kathie Dike, MD   0.5 mg at 08/25/13 0922  . diltiazem (CARDIZEM) tablet 60 mg  60 mg Oral Q12H Asencion Noble, MD   60 mg at 08/25/13 1638  . fluocinonide cream (LIDEX) 4.66 % 1 application  1 application Topical Daily Kathie Dike, MD   1 application at 59/93/57 0923  . furosemide (LASIX) injection 40 mg  40 mg Intravenous BID Kathie Dike, MD   40 mg at 08/25/13 0921  . insulin aspart (novoLOG) injection 0-15 Units  0-15 Units Subcutaneous TID WC Kathie Dike, MD   2 Units at 08/24/13 1800  . insulin aspart (novoLOG) injection 0-5 Units  0-5 Units Subcutaneous QHS Kathie Dike, MD   2 Units at 08/23/13 2151  . insulin glargine (LANTUS) injection 6 Units  6 Units Subcutaneous QHS Kathie Dike, MD   6 Units at  08/24/13 2208  . metoprolol tartrate (LOPRESSOR) tablet 25 mg  25 mg Oral BID Asencion Noble, MD   25 mg at 08/25/13 0177  . ondansetron (ZOFRAN) tablet 4 mg  4 mg Oral Q6H PRN Kathie Dike, MD       Or  . ondansetron (ZOFRAN) injection 4 mg  4 mg Intravenous Q6H PRN Kathie Dike, MD      . pantoprazole (PROTONIX) EC tablet 40 mg  40 mg Oral Daily Kathie Dike, MD   40 mg at 08/25/13 9390  . polyethylene glycol (MIRALAX / GLYCOLAX) packet 17 g  17 g Oral Daily PRN Kathie Dike, MD      . sodium chloride 0.9 % injection 3 mL  3 mL Intravenous Q12H Kathie Dike, MD   3 mL at 08/25/13 0923  . sodium chloride 0.9 % injection 3 mL  3 mL Intravenous PRN Kathie Dike, MD   3 mL at 08/24/13 0807  . triamcinolone cream (KENALOG) 0.1 % 1 application  1 application Topical Daily Kathie Dike, MD   1 application at 30/09/23 1000    Filed Vitals:   08/24/13 2118 08/25/13 0450  08/25/13 0700 08/25/13 0812  BP: 99/62 110/68    Pulse: 92 92  90  Temp: 97.4 F (36.3 C) 97.8 F (36.6 C)    TempSrc: Oral Oral    Resp: 20 20  18   Height:      Weight:      SpO2: 92% 96% 96% 96%    PHYSICAL EXAM General: NAD Neck: no JVD, no thyromegaly.  Lungs: Clear to auscultation bilaterally with normal respiratory effort. CV: Nondisplaced PMI.  Regular rate and rhythm, normal S1/S2, +S3/no S4, harsh 4/6 pansystolic murmur along left sternal border.  2+ pitting pretibial edema with more skin wrinkling appreciated today. Erythematous b/l. Abdomen: Soft, nontender, no hepatosplenomegaly, no distention.  Neurologic: Alert and oriented x 3.  Psych: Normal affect. Extremities: No clubbing or cyanosis.   TELEMETRY: Reviewed telemetry pt in sinus rhythm with PVC's.  LABS: Basic Metabolic Panel:  Recent Labs  08/24/13 0448 08/25/13 0502  NA 139 140  K 5.5* 4.3  CL 99 99  CO2 27 29  GLUCOSE 206* 117*  BUN 81* 77*  CREATININE 3.06* 2.99*  CALCIUM 9.7 9.6   Liver Function Tests:  Recent Labs   08/25/13 0953  ALBUMIN 2.8*   No results found for this basename: LIPASE, AMYLASE,  in the last 72 hours CBC:  Recent Labs  08/23/13 1202 08/24/13 0448 08/25/13 0502  WBC 6.4 4.7 4.2  NEUTROABS 5.1  --   --   HGB 10.0* 8.7* 8.7*  HCT 31.1* 27.3* 27.3*  MCV 103.7* 103.0* 101.9*  PLT 199 170 178   Cardiac Enzymes: No results found for this basename: CKTOTAL, CKMB, CKMBINDEX, TROPONINI,  in the last 72 hours BNP: No components found with this basename: POCBNP,  D-Dimer: No results found for this basename: DDIMER,  in the last 72 hours Hemoglobin A1C: No results found for this basename: HGBA1C,  in the last 72 hours Fasting Lipid Panel: No results found for this basename: CHOL, HDL, LDLCALC, TRIG, CHOLHDL, LDLDIRECT,  in the last 72 hours Thyroid Function Tests: No results found for this basename: TSH, T4TOTAL, FREET3, T3FREE, THYROIDAB,  in the last 72 hours Anemia Panel: No results found for this basename: VITAMINB12, FOLATE, FERRITIN, TIBC, IRON, RETICCTPCT,  in the last 72 hours  RADIOLOGY: US Renal  07/28/2013   CLINICAL DATA:  80 year old female with abnormal renal function. Initial encounter.  EXAM: RENAL/URINARY TRACT ULTRASOUND COMPLETE  COMPARISON:  02/11/2013.  FINDINGS: Right Kidney:  Length: 9.8 cm (unchanged). Chronic increased cortical echogenicity, appears progressed. No hydronephrosis.  Left Kidney:  Not as well visualized today Length: approximately 7.7 cm (previously 9.2 cm). Chronic increased echogenicity. No hydronephrosis.  Bladder:  Diminutive.  Other Findings: Moderate to large volume of ascites, increased since October.  IMPRESSION: 1. No acute renal findings.  Chronic medical renal disease. 2. Progressed ascites, now moderate to severe.   Electronically Signed   By: Lars Pinks M.D.   On: 07/28/2013 17:50   Dg Chest Portable 1 View  08/23/2013   CLINICAL DATA:  Rectal bleeding and CHF.  EXAM: PORTABLE CHEST - 1 VIEW  COMPARISON:  08/19/2013  FINDINGS: Mild  elevation of the right hemidiaphragm is unchanged. Heart size is upper limits of normal but unchanged. The thoracic aortic arch is heavily calcified. There are old right rib fractures. Mild haziness in both lungs have not significantly changed. No evidence for pulmonary edema. No focal airspace disease.  IMPRESSION: No acute chest abnormality.  Atherosclerotic disease.   Electronically Signed  By: Markus Daft M.D.   On: 08/23/2013 14:23   Dg Chest Portable 1 View  08/19/2013   CLINICAL DATA:  Abdominal swelling.  Leg swelling.  EXAM: PORTABLE CHEST - 1 VIEW  COMPARISON:  08/09/2013  FINDINGS: Enlarged cardiopericardial silhouette, similar to prior. Atherosclerotic aortic arch. Indistinct pulmonary vasculature. . Stable arthropathy in both glenohumeral joints. Old right rib fractures.  IMPRESSION: 1. Enlarged cardiopericardial silhouette with indistinct pulmonary vasculature favoring pulmonary venous hypertension. 2. Atherosclerosis.   Electronically Signed   By: Sherryl Barters M.D.   On: 08/19/2013 13:46   Dg Chest Port 1 View  08/09/2013   CLINICAL DATA:  Leg swelling.  EXAM: PORTABLE CHEST - 1 VIEW  COMPARISON:  July 27, 2013.  FINDINGS: Stable cardiomegaly. No pneumothorax is noted. No significant pleural effusion is noted. Linear density is noted in right lung base most consistent with subsegmental atelectasis. Mild central pulmonary vascular congestion is noted. This is improved compared to prior exam. Degenerative changes are seen involving the acromioclavicular joints bilaterally.  IMPRESSION: Stable cardiomegaly. Central pulmonary vascular congestion noted on prior exam is significantly improved. Minimal right apical subsegmental atelectasis is noted.   Electronically Signed   By: Sabino Dick M.D.   On: 08/09/2013 08:38   Dg Chest Port 1 View  07/27/2013   CLINICAL DATA:  Shortness of breath, low oxygen saturation  EXAM: PORTABLE CHEST - 1 VIEW  COMPARISON:  DG CHEST 1V PORT dated 06/21/2013; DG  ABD ACUTE W/CHEST dated 06/10/2013; DG CHEST 1V PORT dated 12/10/2012  FINDINGS: Mild cardiac enlargement and aortic calcifications stable. Limited inspiratory effect. Significant vascular congestion. Peribronchial cuffing with interstitial prominence. More hazy airspace opacity right lower lobe.  IMPRESSION: Congestive heart failure with a mixture of diffuse interstitial and right lower lobe alveolar edema.   Electronically Signed   By: Skipper Cliche M.D.   On: 07/27/2013 14:49      ASSESSMENT AND PLAN: 80 yr old woman with multiple hospitalizations for acute on chronic right heart failure/diastolic heart failure, in the setting of severe valvular heart disease with what is likely severe mitral regurgitation and severe tricuspid regurgitation.  She also has atrial fibrillation and is on Xarelto, and has worsening anemia (10-->8.7 both on 4/28 and today) with heme+ stools. Xarelto d/c on 4/28. She was discharged on torsemide 40 mg daily.  She has been on IV Lasix and has put out 3 liters in past 48 hours. She also has concomitant stage 4 CKD with cardiorenal syndrome.  Given her inoperable valvular heart disease (high risk, poor surgical candidate) leading to multiple hospitalizations and given her advanced age, my recommendation would be hospice. This was also recommended by Dr. Domenic Polite during her hospitalization earlier this month.  I had a long discussion with the patient both yesterday and today and she is agreeable to considering this, but prefers to be closer to [place] and likes [employer]. I think there is a high recurrence for repeat hospitalizations unless she receives a higher level of care.  She was evaluated by [facility]. For the time being, continue IV Lasix at present dosage.     [name], M.D., F.A.C.C.

## 2013-08-26 DIAGNOSIS — R0902 Hypoxemia: Secondary | ICD-10-CM

## 2013-08-26 LAB — BASIC METABOLIC PANEL
BUN: 73 mg/dL — AB (ref 6–23)
CALCIUM: 9.4 mg/dL (ref 8.4–10.5)
CO2: 32 mEq/L (ref 19–32)
Chloride: 99 mEq/L (ref 96–112)
Creatinine, Ser: 2.74 mg/dL — ABNORMAL HIGH (ref 0.50–1.10)
GFR, EST AFRICAN AMERICAN: 17 mL/min — AB (ref >90)
GFR, EST NON AFRICAN AMERICAN: 15 mL/min — AB (ref >90)
Glucose, Bld: 85 mg/dL (ref 70–99)
Potassium: 3.7 mEq/L (ref 3.7–5.3)
Sodium: 142 mEq/L (ref 137–147)

## 2013-08-26 LAB — GLUCOSE, CAPILLARY
GLUCOSE-CAPILLARY: 123 mg/dL — AB (ref 70–99)
Glucose-Capillary: 82 mg/dL (ref 70–99)
Glucose-Capillary: 144 mg/dL — ABNORMAL HIGH (ref 70–99)
Glucose-Capillary: 139 mg/dL — ABNORMAL HIGH (ref 70–99)

## 2013-08-26 MED ORDER — POTASSIUM CHLORIDE CRYS ER 10 MEQ PO TBCR
10.0000 meq | EXTENDED_RELEASE_TABLET | Freq: Two times a day (BID) | ORAL | Status: DC
  Administered 2013-08-26 – 2013-08-30 (×9): 10 meq via ORAL
  Filled 2013-08-26 (×9): qty 1

## 2013-08-26 NOTE — Progress Notes (Signed)
Consulting cardiologist: [name] Primary Cardiologist: [name] MD  Subjective:   Sleepy but arouses to tactile stimuli. Responsive. No complaints,    Objective:   Temp:  [97.5 F (36.4 C)-97.8 F (36.6 C)] 97.5 F (36.4 C) (04/30 0610) Pulse Rate:  [85-97] 85 (04/30 0610) Resp:  [20] 20 (04/30 0610) BP: (101-106)/(53-63) 104/53 mmHg (04/30 0610) SpO2:  [90 %-100 %] 93 % (04/30 0726) Last BM Date: 08/25/13  Filed Weights   08/23/13 1910 08/24/13 0500  Weight: 171 lb 8.3 oz (77.8 kg) 171 lb 15.3 oz (78 kg)    Intake/Output Summary (Last 24 hours) at 08/26/13 1110 Last data filed at 08/25/13 2222  Gross per 24 hour  Intake    600 ml  Output   1100 ml  Net   -500 ml     Exam:  General: No acute distress.  HEENT: Conjunctiva and lids normal, oropharynx clear.  Lungs: Clear to auscultation, nonlabored.  Cardiac: No elevated JVP or bruits. RRR, 2/6 holosystolic murmur no gallop or rub.   Abdomen: Distended, hypoactive bowel sounds.   Extremities: 1+ pitting edema, distal pulses difficult to palpate. Skin thickening with scaling.  Neuropsychiatric: Alert and oriented x3, affect appropriate, but falls asleep easily.    Lab Results:  Basic Metabolic Panel:  Recent Labs Lab 08/24/13 0448 08/25/13 0502 08/26/13 0457  NA 139 140 142  K 5.5* 4.3 3.7  CL 99 99 99  CO2 27 29 32  GLUCOSE 206* 117* 85  BUN 81* 77* 73*  CREATININE 3.06* 2.99* 2.74*  CALCIUM 9.7 9.6 9.4    Liver Function Tests:  Recent Labs Lab 08/25/13 0953  ALBUMIN 2.8*    CBC:  Recent Labs Lab 08/23/13 1202 08/24/13 0448 08/25/13 0502  WBC 6.4 4.7 4.2  HGB 10.0* 8.7* 8.7*  HCT 31.1* 27.3* 27.3*  MCV 103.7* 103.0* 101.9*  PLT 199 170 178    Cardiac Enzymes: No results found for this basename: CKTOTAL, CKMB, CKMBINDEX, TROPONINI,  in the last 168 hours  BNP:  Recent Labs  07/27/13 1503 08/09/13 0807 08/23/13 1202  PROBNP 12316.0* 10577.0*  11032.0*     Medications:   Scheduled Medications: . albuterol  2.5 mg Nebulization TID  . calcium gluconate  1 g Intravenous Once  . citalopram  20 mg Oral Daily  . diltiazem  60 mg Oral Q12H  . fluocinonide cream  1 application Topical Daily  . furosemide  40 mg Intravenous BID  . insulin aspart  0-15 Units Subcutaneous TID WC  . insulin aspart  0-5 Units Subcutaneous QHS  . insulin glargine  6 Units Subcutaneous QHS  . metoprolol tartrate  25 mg Oral BID  . pantoprazole  40 mg Oral Daily  . potassium chloride  10 mEq Oral BID  . sodium chloride  3 mL Intravenous Q12H  . triamcinolone cream  1 application Topical Daily    PRN Medications: sodium chloride, acetaminophen, acetaminophen, albuterol, clonazePAM, ondansetron (ZOFRAN) IV, ondansetron, polyethylene glycol, sodium chloride   Assessment and Plan:   1. Acute on Chronic right heart failure with diastolic CHF: Continues to diurese with improvement of symptoms. Remains on IV lasix. Creatinine 2.74. Not sure she is ready to transition back to torsemide just yet. Still with edema of the LE and poor respiratory effort with diminished breath sounds. Not been weighed in 2 days.  As stated with overall health deterioration, diureses will be a temporary fix. Hospice has been discussed.   2. Atrial fibrillation: She  has been taken off of Xarelto. EDG has not been completed at this time. Continues on PPI. Hgb 8.7 and stable.   3. Severe Valve Disease: Inoperable.     Catherine [name]. Purcell Nails NP [facility] 08/26/2013, 11:10 AM

## 2013-08-26 NOTE — Progress Notes (Signed)
The patient was seen and examined, and I agree with the assessment and plan as documented above, with modifications as noted below. 80 yr old woman with multiple hospitalizations for acute on chronic right heart failure/diastolic heart failure, in the setting of severe valvular heart disease with what is likely severe mitral regurgitation and severe tricuspid regurgitation.  She also has atrial fibrillation and had been on Xarelto, and has worsening anemia (10-->8.7 both on 4/28 and 4/29) with heme+ stools. Xarelto d/c on 4/28.  She was discharged on torsemide 40 mg daily.   She has been on IV Lasix and has put out 2 liters in past 48 hours. She also has concomitant stage 4 CKD with cardiorenal syndrome.  Given her inoperable valvular heart disease (high risk, poor surgical candidate) leading to multiple hospitalizations and given her advanced age, my recommendation would be hospice. This was also recommended by Dr. Domenic Polite during her hospitalization earlier this month.  I previously had a long discussion with the patient. I think there is a high recurrence for repeat hospitalizations unless she receives a higher level of care.  Social work has been working on this. For the time being, continue IV Lasix at present dosage.

## 2013-08-26 NOTE — Care Management Utilization Note (Signed)
UR completed 

## 2013-08-27 ENCOUNTER — Ambulatory Visit (HOSPITAL_COMMUNITY): Payer: Medicare Other

## 2013-08-27 LAB — GLUCOSE, CAPILLARY
GLUCOSE-CAPILLARY: 101 mg/dL — AB (ref 70–99)
Glucose-Capillary: 133 mg/dL — ABNORMAL HIGH (ref 70–99)
Glucose-Capillary: 142 mg/dL — ABNORMAL HIGH (ref 70–99)
Glucose-Capillary: 156 mg/dL — ABNORMAL HIGH (ref 70–99)

## 2013-08-27 NOTE — Progress Notes (Signed)
The patient was seen and examined, and I agree with the assessment and plan as documented above, with modifications as noted below.  She is doing much better this morning, is alert, and says she feels much better!  In summary, she is an 80 yr old woman with multiple hospitalizations for acute on chronic right heart failure/diastolic heart failure, in the setting of severe valvular heart disease with what is likely severe mitral regurgitation and severe tricuspid regurgitation.  She also has atrial fibrillation and had been on Xarelto, and has worsening anemia (10-->8.7 both on 4/28 and 4/29) with heme+ stools. Xarelto d/c on 4/28.  She was discharged on torsemide 40 mg daily.  She has been on IV Lasix and has put out 1.4 liters in past 48 hours. She also has concomitant stage 4 CKD with cardiorenal syndrome.  Given her inoperable valvular heart disease (high risk, poor surgical candidate) leading to multiple hospitalizations and given her advanced age, my recommendation would be hospice. This was also recommended by Dr. Domenic Polite during her hospitalization earlier this month.  I previously had a long discussion with the patient. I think there is a high recurrence for repeat hospitalizations unless she receives a higher level of care.  Social work has been working on this.  For the time being, continue IV Lasix at present dosage.  Agree with recommended oral diuretic torsemide regimen as noted above at time of discharge.

## 2013-08-27 NOTE — Progress Notes (Signed)
Consulting cardiologist: Dwana Curd MD Primary Cardiologist: [name] MD  Subjective:    Feels better. More alert and smiling.   Objective:   Temp:  [97.4 F (36.3 C)-98.2 F (36.8 C)] 97.4 F (36.3 C) (05/01 0530) Pulse Rate:  [97-100] 98 (05/01 0530) Resp:  [20] 20 (05/01 0530) BP: (93-122)/(59-74) 122/74 mmHg (05/01 0530) SpO2:  [90 %-96 %] 90 % (05/01 0711) Weight:  [161 lb 8 oz (73.256 kg)] 161 lb 8 oz (73.256 kg) (05/01 0837) Last BM Date: 08/25/13  Filed Weights   08/23/13 1910 08/24/13 0500 08/27/13 0837  Weight: 171 lb 8.3 oz (77.8 kg) 171 lb 15.3 oz (78 kg) 161 lb 8 oz (73.256 kg)    Intake/Output Summary (Last 24 hours) at 08/27/13 0924 Last data filed at 08/27/13 0529  Gross per 24 hour  Intake    720 ml  Output   1550 ml  Net   -830 ml    Telemetry: Atrial fib.   Exam:  General: No acute distress.  HEENT: Conjunctiva and lids normal, oropharynx clear.  Lungs: Clear to auscultation, nonlabored.  Cardiac: No elevated JVP or bruits. IRRR, 2/6 systolic murmur. no gallop or rub.   Abdomen: Normoactive bowel sounds, nontender, nondistended.  Extremities: 1+ pitting edema, distal pulses full.  Neuropsychiatric: Alert and oriented x3, affect appropriate.   Lab Results:  Basic Metabolic Panel:  Recent Labs Lab 08/24/13 0448 08/25/13 0502 08/26/13 0457  NA 139 140 142  K 5.5* 4.3 3.7  CL 99 99 99  CO2 27 29 32  GLUCOSE 206* 117* 85  BUN 81* 77* 73*  CREATININE 3.06* 2.99* 2.74*  CALCIUM 9.7 9.6 9.4    Liver Function Tests:  Recent Labs Lab 08/25/13 0953  ALBUMIN 2.8*    CBC:  Recent Labs Lab 08/23/13 1202 08/24/13 0448 08/25/13 0502  WBC 6.4 4.7 4.2  HGB 10.0* 8.7* 8.7*  HCT 31.1* 27.3* 27.3*  MCV 103.7* 103.0* 101.9*  PLT 199 170 178   BNP:  Recent Labs  07/27/13 1503 08/09/13 0807 08/23/13 1202  PROBNP 12316.0* 10577.0* 11032.0*     Medications:   Scheduled Medications: . albuterol  2.5 mg  Nebulization TID  . calcium gluconate  1 g Intravenous Once  . citalopram  20 mg Oral Daily  . diltiazem  60 mg Oral Q12H  . fluocinonide cream  1 application Topical Daily  . furosemide  40 mg Intravenous BID  . insulin aspart  0-15 Units Subcutaneous TID WC  . insulin aspart  0-5 Units Subcutaneous QHS  . insulin glargine  6 Units Subcutaneous QHS  . metoprolol tartrate  25 mg Oral BID  . pantoprazole  40 mg Oral Daily  . potassium chloride  10 mEq Oral BID  . sodium chloride  3 mL Intravenous Q12H  . triamcinolone cream  1 application Topical Daily       PRN Medications: sodium chloride, acetaminophen, acetaminophen, albuterol, clonazePAM, ondansetron (ZOFRAN) IV, ondansetron, polyethylene glycol, sodium chloride   Assessment and Plan:   1.Acute on Chronic Right Heart Failure and Diastolic CHF: She continues to diurese well on lasix 40 mg IV BID. Creatinine 2.74 with CO 2 of 32. Approaching new dry wt. I  think we should make it a lower value now with her continuing frailty and debilitation, On last discharge wt was 172 lbs, wt now, 161 llbs. Plan on transitioning to po over the weekend. Demedex 40 mg in am/ 20 mg in pm.  2. Atrial fib: No  anticoagulation due to melena. None since stopping Xarelto.  3. Mitral and Tricuspid valve disease; Inoperable with poor prognosis.      Phill Myron. Purcell Nails NP [facility] 08/27/2013, 9:24 AM

## 2013-08-27 NOTE — Clinical Social Work Note (Signed)
Updated [facility] that patient remains here- being treated with ABX for +UA. ALF is asking for RX script from MD- Tammy at ALF to call MD's office to request this in case of dc over weekend. Eduard Clos, MSW, [place]

## 2013-08-28 LAB — GLUCOSE, CAPILLARY
GLUCOSE-CAPILLARY: 111 mg/dL — AB (ref 70–99)
GLUCOSE-CAPILLARY: 173 mg/dL — AB (ref 70–99)
Glucose-Capillary: 76 mg/dL (ref 70–99)

## 2013-08-28 LAB — BASIC METABOLIC PANEL
BUN: 80 mg/dL — ABNORMAL HIGH (ref 6–23)
CHLORIDE: 98 meq/L (ref 96–112)
CO2: 31 mEq/L (ref 19–32)
Calcium: 8.7 mg/dL (ref 8.4–10.5)
Creatinine, Ser: 2.77 mg/dL — ABNORMAL HIGH (ref 0.50–1.10)
GFR calc Af Amer: 17 mL/min — ABNORMAL LOW (ref >90)
GFR calc non Af Amer: 14 mL/min — ABNORMAL LOW (ref >90)
GLUCOSE: 98 mg/dL (ref 70–99)
Potassium: 3.8 mEq/L (ref 3.7–5.3)
Sodium: 140 mEq/L (ref 137–147)

## 2013-08-28 NOTE — Progress Notes (Signed)
NAME:  JEFF, MCCALLUM                   ACCOUNT NO.:  [id number]  MEDICAL RECORD NO.:  [id number]  LOCATION:  A306                          FACILITY:  [facility]  PHYSICIAN:  Paula Compton. Willey Blade, MD       DATE OF BIRTH:  06-Apr-1924  DATE OF PROCEDURE:  08/28/2013 DATE OF DISCHARGE:                                PROGRESS NOTE   SUBJECTIVE:  Mrs. Wojdyla states that she is feeling better.  She continues to feel swelling in her legs.  She has diuresed 1400 mL.  Her weight was 161 yesterday but is not recorded today.  OBJECTIVE:  VITAL SIGNS:  Temperature 97.8, pulse 95, blood pressure 110/68. LUNGS:  Clear. HEART:  Irregularly irregular with a grade 2 systolic murmur. ABDOMEN:  Unchanged. EXTREMITIES:  Edema in the upper extremities, improved.  Edema in the thighs persists.  IMPRESSION/PLAN: 1. Congestive heart failure with a normal ejection fraction.  Continue     IV Lasix 40 mg twice a day.  Potassium is 3.8. 2. Chronic kidney disease.  BUN and creatinine are 80 and 2.77. 3. Pulmonary hypertension. 4. Valvular heart disease. 5. Diabetes.  Glucose is 98 this morning. 6. Atrial fibrillation.  Continue metoprolol and diltiazem.     Paula Compton. Willey Blade, MD     ROF/MEDQ  D:  08/28/2013  T:  08/28/2013  Job:  [id number]

## 2013-08-29 LAB — GLUCOSE, CAPILLARY
Glucose-Capillary: 224 mg/dL — ABNORMAL HIGH (ref 70–99)
Glucose-Capillary: 85 mg/dL (ref 70–99)
Glucose-Capillary: 115 mg/dL — ABNORMAL HIGH (ref 70–99)
Glucose-Capillary: 114 mg/dL — ABNORMAL HIGH (ref 70–99)

## 2013-08-29 MED ORDER — MAGNESIUM HYDROXIDE 400 MG/5ML PO SUSP
30.0000 mL | Freq: Once | ORAL | Status: AC
  Administered 2013-08-29: 30 mL via ORAL
  Filled 2013-08-29: qty 30

## 2013-08-30 ENCOUNTER — Inpatient Hospital Stay (HOSPITAL_COMMUNITY): Payer: Medicare Other

## 2013-08-30 LAB — GLUCOSE, CAPILLARY
GLUCOSE-CAPILLARY: 161 mg/dL — AB (ref 70–99)
GLUCOSE-CAPILLARY: 98 mg/dL (ref 70–99)
Glucose-Capillary: 136 mg/dL — ABNORMAL HIGH (ref 70–99)

## 2013-08-30 MED ORDER — DILTIAZEM HCL 60 MG PO TABS: 60.0000 mg | ORAL_TABLET | Freq: Two times a day (BID) | ORAL | Status: AC

## 2013-08-30 MED ORDER — POTASSIUM CHLORIDE CRYS ER 10 MEQ PO TBCR: 10.0000 meq | EXTENDED_RELEASE_TABLET | Freq: Two times a day (BID) | ORAL | Status: AC

## 2013-08-30 MED ORDER — TORSEMIDE 20 MG PO TABS: 40.0000 mg | ORAL_TABLET | Freq: Two times a day (BID) | ORAL | Status: AC

## 2013-08-30 MED ORDER — METOPROLOL TARTRATE 25 MG PO TABS: 25.0000 mg | ORAL_TABLET | Freq: Two times a day (BID) | ORAL | Status: AC

## 2013-08-30 NOTE — Progress Notes (Addendum)
Olivia Mackie, RN from cancer center called to say patient was SOB and unable to complete appointment.  Notified Dr. Willey Blade and went to [place] to assess patient.  Patient appeared at baseline, O2 sats were 89% on 3lpm.

## 2013-08-30 NOTE — Progress Notes (Signed)
Nurse from [facility] states the patient cannot stay for appointment.  The charge nurse was notified of situation and she called and spoke with Dr Willey Blade who stated the patient was at baseline and could be discharged to [facility].  Patient brought back to 3rd floor, breathing at baseline.  VS stable.

## 2013-08-30 NOTE — Discharge Summary (Signed)
NAME:  Catherine [name], Catherine [name]                   ACCOUNT NO.:  [id number]  MEDICAL RECORD NO.:  [id number]  LOCATION:  A306                          FACILITY:  [facility]  PHYSICIAN:  Paula Compton. Willey Blade, MD       DATE OF BIRTH:  1924/03/03  DATE OF ADMISSION:  08/23/2013 DATE OF DISCHARGE:  [facility]                              DISCHARGE SUMMARY   DISCHARGE DIAGNOSES: 1. Acute-on-chronic diastolic heart failure. 2. Chronic atrial fibrillation. 3. Chronic kidney disease. 4. Mitral and tricuspid regurgitation. 5. GI bleed of unclear etiology. 6. Type 2 diabetes. 7. Epidermolysis bullosa. 8. Hyperkalemia.  DISCHARGE MEDICATIONS: 1. Diltiazem 60 mg b.i.d. 2. Metoprolol 25 mg b.i.d. 3. Potassium 10 mEq b.i.d. 4. Torsemide 40 mg b.i.d. 5. Acetaminophen 650 mg every 6 hours p.r.n. 6. Albuterol inhaler 2 puffs every 4 hours p.r.n. 7. Citalopram 20 mg daily. 8. Clonazepam 0.5 mg t.i.d. p.r.n. 9. Vitamin B12 1000 mcg monthly. 10.Ferrous sulfate 325 mg daily. 11.Lidex cream, apply daily. 12.Lantus 6 units daily. 13.Pantoprazole 40 mg daily. 14.MiraLAX 17 g daily p.r.n. 15.Triamcinolone cream 0.1%, apply daily.  HOSPITAL COURSE:  This patient is an 80 year old female with history of heart failure and kidney failure who presented with rectal bleeding. She had been anticoagulated with Xarelto because of chronic atrial fibrillation.  She was also hyperkalemia.  She was treated with Kayexalate.  Her potassium normalized.  She had developed increased edema in her extremities again, but did not reveal evidence of pulmonary edema.  She required treatment with IV diuretic therapy with Lasix.  Her edema gradually improved.  She was seen again by Cardiology.  She has inoperable mitral gross regurgitation and tricuspid regurgitation.  She also has pulmonary hypertension.  She has chronic atrial fibrillation which is controlled with metoprolol and diltiazem.  Her gastrointestinal bleeding stopped after Xarelto was  discontinued.  The plan was to not engage in additional diagnostic studies such as upper endoscopy or colonoscopy. She was seen by Gastroenterology.  She has been treated with pantoprazole.  Her edema has improved.  Her general strength has improved.  Hospice will be consulted.  She will be seen in followup in 1 week.  CONDITION AT DISCHARGE:  Significantly improved.     Paula Compton. Willey Blade, MD     ROF/MEDQ  D:  08/30/2013  T:  08/30/2013  Job:  [id number]

## 2013-08-30 NOTE — Clinical Social Work Note (Signed)
Patient ready for discharge today, will return to [facility] via facility transport.  Facility requested that she attend her 1:30 appt at [facility].  RN notified.  Facility notified that [place] questioned whether ALF staff should accompany her to outpatient appt.  Discharge summary and FL2 faxed to facility via [facility].  Discharge packet prepared and placed w shadow chart for transport.  Facility aware that MD has recommended hospice be consulted for home support at ALF.  Tammy says she will investigate whether facility can care for patients on hospice at their ALF, she will notify CSW when she has an answer.  Facility and patient both agreeable to transfer back to [facility] today, CSW also notified patient's niece/POA of discharge.  CSW signing off as no further SW needs identified  Edwyna Shell, [place] Worker [phone])

## 2013-08-30 NOTE — Progress Notes (Unsigned)
Catherine [name]  OFFICE PROGRESS Catherine Miner, MD [address] [place] [address]  DIAGNOSIS: No diagnosis found.  Chief Complaint  Patient presents with  . Anemia in chronic kidney disease    CURRENT THERAPY: Watchful expectation and Aranesp every 3 weeks if hemoglobin is less than 11 for anemia of chronic disease in chronic kidney disease with an underlying diagnosis of MGUS. PROLIA administered on 02/23/2013.  INTERVAL HISTORY: Catherine [name] 80 y.o. female returns for followup of anemia of chronic disease, osteoporosis and MGUS.  MEDICAL HISTORY: Past Medical History  Diagnosis Date  . Diastolic heart failure     LVEF 60%  . Type 2 diabetes mellitus   . Essential hypertension, benign   . COPD (chronic obstructive pulmonary disease)     Home O2  . Vertigo   . Breast cancer   . Stroke      Patient denies  . Skin disorder     Epidermolysis bullosa.  . Pulmonary hypertension   . Multiple thyroid nodules   . Anemia   . Aortic stenosis   . Monoclonal gammopathy   . Diverticulosis   . Squamous cell carcinoma   . Osteoporosis   . CKD (chronic kidney disease) stage 4, GFR 15-29 ml/min   . Atrial fibrillation   . Mitral regurgitation     Moderate with severe left atrial enlargement    INTERIM HISTORY: has Anemia of other chronic disease; Chronic diastolic CHF (congestive heart failure); Community acquired pneumonia; Hypertension; Atrial fibrillation; Chronic kidney disease (CKD) stage G4/A1, severely decreased glomerular filtration rate (GFR) between 15-29 mL/min/1.73 square meter and albuminuria creatinine ratio less than 30 mg/g; Chronic cor pulmonale; Fever, unspecified; Pleural effusion; Nausea with vomiting; Chronic respiratory failure; Thrombocytopenia; BRBPR (bright red blood per rectum); Anemia associated with acute blood loss; Diabetic ulcer of lower extremity; MGUS (monoclonal gammopathy of unknown  significance); Neurodermatitis; Diverticulosis; Diabetes; Osteoporosis, unspecified; CHF (congestive heart failure); Diastolic CHF, acute on chronic; Chronic indwelling Foley catheter; Chronic respiratory failure with hypoxia; CHF, acute on chronic; Acute on chronic diastolic heart failure; Mitral regurgitation; UTI (lower urinary tract infection); Epidermolysis bullosa; Hyperkalemia; and Heme positive stool on her problem list.    ALLERGIES:  is allergic to amoxicillin-pot clavulanate.  MEDICATIONS: has a current medication list which includes the following prescription(s): acetaminophen, albuterol, atorvastatin, calcitriol, cholecalciferol, ciprofloxacin, citalopram, clonazepam, cyanocobalamin, diltiazem, diltiazem, ferrous sulfate, fluocinonide cream, insulin glargine, metoprolol, metoprolol tartrate, mirtazapine, oyster calcium, pantoprazole, polyethylene glycol, potassium chloride, potassium chloride sa, rivaroxaban, torsemide, torsemide, and triamcinolone cream, and the following Facility-Administered Medications: sodium chloride, acetaminophen, acetaminophen, albuterol, albuterol, calcium gluconate 1 g in sodium chloride 0.9 % 100 mL IVPB, citalopram, clonazepam, diltiazem, fluocinonide cream, furosemide, insulin aspart, insulin aspart, insulin glargine, metoprolol tartrate, ondansetron, ondansetron, pantoprazole, polyethylene glycol, potassium chloride, sodium chloride, sodium chloride, and triamcinolone cream.  SURGICAL HISTORY:  Past Surgical History  Procedure Laterality Date  . Cholecystectomy    . Hemorrhoid surgery    . Abdominal hysterectomy  1991    Complete hysterectomy for cancer  . Mastectomy  2005    Left breast, tamoxifin  . Goiter    . Hip fracture surgery  2010    Right hip replacement  . Hip fracture surgery  2006    Left hip replacement  . Colonoscopy  ?  Marland Kitchen Cataract extraction, bilateral      FAMILY HISTORY: family history includes Diabetes in her mother; Heart attack  in her son. There is no history of Colon cancer.  SOCIAL HISTORY:  reports that she has never smoked. She has never used smokeless tobacco. She reports that she does not drink alcohol or use illicit drugs.  REVIEW OF SYSTEMS:  Other than that discussed above is noncontributory.  PHYSICAL EXAMINATION: ECOG PERFORMANCE STATUS: {CHL ONC ECOG PS:[phone]}  There were no vitals taken for this visit.  GENERAL:alert, no distress and comfortable SKIN: skin color, texture, turgor are normal, no rashes or significant lesions EYES: PERLA; Conjunctiva are pink and non-injected, sclera clear SINUSES: No redness or tenderness over maxillary or ethmoid sinuses OROPHARYNX:no exudate, no erythema on lips, buccal mucosa, or tongue. NECK: supple, thyroid normal size, non-tender, without nodularity. No masses CHEST: *** LYMPH:  no palpable lymphadenopathy in the cervical, axillary or inguinal LUNGS: clear to auscultation and percussion with normal breathing effort HEART: regular rate & rhythm and no murmurs. ABDOMEN:abdomen soft, non-tender and normal bowel sounds MUSCULOSKELETAL:no cyanosis of digits and no clubbing. Range of motion normal.  NEURO: alert & oriented x 3 with fluent speech, no focal motor/sensory deficits   LABORATORY DATA: Admission on 08/23/2013  Component Date Value Ref Range Status  . WBC 08/23/2013 6.4  4.0 - 10.5 K/uL Final  . RBC 08/23/2013 3.00* 3.87 - 5.11 MIL/uL Final  . Hemoglobin 08/23/2013 10.0* 12.0 - 15.0 g/dL Final  . HCT 08/23/2013 31.1* 36.0 - 46.0 % Final  . MCV 08/23/2013 103.7* 78.0 - 100.0 fL Final  . MCH 08/23/2013 33.3  26.0 - 34.0 pg Final  . MCHC 08/23/2013 32.2  30.0 - 36.0 g/dL Final  . RDW 08/23/2013 14.9  11.5 - 15.5 % Final  . Platelets 08/23/2013 199  150 - 400 K/uL Final  . Neutrophils Relative % 08/23/2013 79* 43 - 77 % Final  . Neutro Abs 08/23/2013 5.1  1.7 - 7.7 K/uL Final  . Lymphocytes Relative 08/23/2013 5* 12 - 46 % Final  . Lymphs Abs  08/23/2013 0.3* 0.7 - 4.0 K/uL Final  . Monocytes Relative 08/23/2013 12  3 - 12 % Final  . Monocytes Absolute 08/23/2013 0.8  0.1 - 1.0 K/uL Final  . Eosinophils Relative 08/23/2013 4  0 - 5 % Final  . Eosinophils Absolute 08/23/2013 0.2  0.0 - 0.7 K/uL Final  . Basophils Relative 08/23/2013 0  0 - 1 % Final  . Basophils Absolute 08/23/2013 0.0  0.0 - 0.1 K/uL Final  . Sodium 08/23/2013 136* 137 - 147 mEq/L Final  . Potassium 08/23/2013 6.6* 3.7 - 5.3 mEq/L Final   Comment: CRITICAL RESULT CALLED TO, READ BACK BY AND VERIFIED WITH:                          Junious Silk AT 1332 BY HUFFINES,S ON 08/23/13  . Chloride 08/23/2013 96  96 - 112 mEq/L Final  . CO2 08/23/2013 27  19 - 32 mEq/L Final  . Glucose, Bld 08/23/2013 261* 70 - 99 mg/dL Final  . BUN 08/23/2013 76* 6 - 23 mg/dL Final  . Creatinine, Ser 08/23/2013 3.30* 0.50 - 1.10 mg/dL Final  . Calcium 08/23/2013 10.0  8.4 - 10.5 mg/dL Final  . GFR calc non Af Amer 08/23/2013 12* >90 mL/min Final  . GFR calc Af Amer 08/23/2013 13* >90 mL/min Final   Comment: (NOTE)                          The  eGFR has been calculated using the CKD EPI equation.                          This calculation has not been validated in all clinical situations.                          eGFR's persistently <90 mL/min signify possible Chronic Kidney                          Disease.  . Pro B Natriuretic peptide (BNP) 08/23/2013 11032.0* 0 - 450 pg/mL Final  . Fecal Occult Bld 08/23/2013 POSITIVE* NEGATIVE Final  . Sodium 08/24/2013 139  137 - 147 mEq/L Final  . Potassium 08/24/2013 5.5* 3.7 - 5.3 mEq/L Final  . Chloride 08/24/2013 99  96 - 112 mEq/L Final  . CO2 08/24/2013 27  19 - 32 mEq/L Final  . Glucose, Bld 08/24/2013 206* 70 - 99 mg/dL Final  . BUN 08/24/2013 81* 6 - 23 mg/dL Final  . Creatinine, Ser 08/24/2013 3.06* 0.50 - 1.10 mg/dL Final  . Calcium 08/24/2013 9.7  8.4 - 10.5 mg/dL Final  . GFR calc non Af Amer 08/24/2013 13* >90 mL/min Final  . GFR calc  Af Amer 08/24/2013 15* >90 mL/min Final   Comment: (NOTE)                          The eGFR has been calculated using the CKD EPI equation.                          This calculation has not been validated in all clinical situations.                          eGFR's persistently <90 mL/min signify possible Chronic Kidney                          Disease.  . WBC 08/24/2013 4.7  4.0 - 10.5 K/uL Final  . RBC 08/24/2013 2.65* 3.87 - 5.11 MIL/uL Final  . Hemoglobin 08/24/2013 8.7* 12.0 - 15.0 g/dL Final  . HCT 08/24/2013 27.3* 36.0 - 46.0 % Final  . MCV 08/24/2013 103.0* 78.0 - 100.0 fL Final  . MCH 08/24/2013 32.8  26.0 - 34.0 pg Final  . MCHC 08/24/2013 31.9  30.0 - 36.0 g/dL Final  . RDW 08/24/2013 15.0  11.5 - 15.5 % Final  . Platelets 08/24/2013 170  150 - 400 K/uL Final  . Potassium 08/23/2013 6.1* 3.7 - 5.3 mEq/L Final  . Glucose-Capillary 08/23/2013 237* 70 - 99 mg/dL Final  . Comment 1 08/23/2013 Notify RN   Final  . Glucose-Capillary 08/24/2013 164* 70 - 99 mg/dL Final  . Glucose-Capillary 08/24/2013 155* 70 - 99 mg/dL Final  . WBC 08/25/2013 4.2  4.0 - 10.5 K/uL Final  . RBC 08/25/2013 2.68* 3.87 - 5.11 MIL/uL Final  . Hemoglobin 08/25/2013 8.7* 12.0 - 15.0 g/dL Final  . HCT 08/25/2013 27.3* 36.0 - 46.0 % Final  . MCV 08/25/2013 101.9* 78.0 - 100.0 fL Final  . MCH 08/25/2013 32.5  26.0 - 34.0 pg Final  . MCHC 08/25/2013 31.9  30.0 - 36.0 g/dL Final  . RDW 08/25/2013 14.9  11.5 - 15.5 % Final  .  Platelets 08/25/2013 178  150 - 400 K/uL Final  . Sodium 08/25/2013 140  137 - 147 mEq/L Final  . Potassium 08/25/2013 4.3  3.7 - 5.3 mEq/L Final   DELTA CHECK NOTED  . Chloride 08/25/2013 99  96 - 112 mEq/L Final  . CO2 08/25/2013 29  19 - 32 mEq/L Final  . Glucose, Bld 08/25/2013 117* 70 - 99 mg/dL Final  . BUN 08/25/2013 77* 6 - 23 mg/dL Final  . Creatinine, Ser 08/25/2013 2.99* 0.50 - 1.10 mg/dL Final  . Calcium 08/25/2013 9.6  8.4 - 10.5 mg/dL Final  . GFR calc non Af Amer  08/25/2013 13* >90 mL/min Final  . GFR calc Af Amer 08/25/2013 15* >90 mL/min Final   Comment: (NOTE)                          The eGFR has been calculated using the CKD EPI equation.                          This calculation has not been validated in all clinical situations.                          eGFR's persistently <90 mL/min signify possible Chronic Kidney                          Disease.  . Glucose-Capillary 08/24/2013 141* 70 - 99 mg/dL Final  . Glucose-Capillary 08/24/2013 167* 70 - 99 mg/dL Final  . Glucose-Capillary 08/25/2013 135* 70 - 99 mg/dL Final  . Glucose-Capillary 08/25/2013 106* 70 - 99 mg/dL Final  . Comment 1 08/25/2013 Notify RN   Final  . Albumin 08/25/2013 2.8* 3.5 - 5.2 g/dL Final  . Glucose-Capillary 08/25/2013 107* 70 - 99 mg/dL Final  . Comment 1 08/25/2013 Notify RN   Final  . Sodium 08/26/2013 142  137 - 147 mEq/L Final  . Potassium 08/26/2013 3.7  3.7 - 5.3 mEq/L Final  . Chloride 08/26/2013 99  96 - 112 mEq/L Final  . CO2 08/26/2013 32  19 - 32 mEq/L Final  . Glucose, Bld 08/26/2013 85  70 - 99 mg/dL Final  . BUN 08/26/2013 73* 6 - 23 mg/dL Final  . Creatinine, Ser 08/26/2013 2.74* 0.50 - 1.10 mg/dL Final  . Calcium 08/26/2013 9.4  8.4 - 10.5 mg/dL Final  . GFR calc non Af Amer 08/26/2013 15* >90 mL/min Final  . GFR calc Af Amer 08/26/2013 17* >90 mL/min Final   Comment: (NOTE)                          The eGFR has been calculated using the CKD EPI equation.                          This calculation has not been validated in all clinical situations.                          eGFR's persistently <90 mL/min signify possible Chronic Kidney                          Disease.  . Glucose-Capillary 08/25/2013 120* 70 - 99 mg/dL Final  . Comment 1 08/25/2013 Notify RN  Final  . Glucose-Capillary 08/25/2013 180* 70 - 99 mg/dL Final  . Comment 1 08/25/2013 Documented in Chart   Final  . Comment 2 08/25/2013 Notify RN   Final  . Glucose-Capillary 08/26/2013 82   70 - 99 mg/dL Final  . Glucose-Capillary 08/26/2013 123* 70 - 99 mg/dL Final  . Comment 1 08/26/2013 Notify RN   Final  . Glucose-Capillary 08/26/2013 144* 70 - 99 mg/dL Final  . Comment 1 08/26/2013 Notify RN   Final  . Glucose-Capillary 08/26/2013 139* 70 - 99 mg/dL Final  . Comment 1 08/26/2013 Documented in Chart   Final  . Comment 2 08/26/2013 Notify RN   Final  . Glucose-Capillary 08/27/2013 101* 70 - 99 mg/dL Final  . Comment 1 08/27/2013 Documented in Chart   Final  . Comment 2 08/27/2013 Notify RN   Final  . Glucose-Capillary 08/27/2013 133* 70 - 99 mg/dL Final  . Comment 1 08/27/2013 Documented in Chart   Final  . Comment 2 08/27/2013 Notify RN   Final  . Glucose-Capillary 08/27/2013 142* 70 - 99 mg/dL Final  . Comment 1 08/27/2013 Notify RN   Final  . Sodium 08/28/2013 140  137 - 147 mEq/L Final  . Potassium 08/28/2013 3.8  3.7 - 5.3 mEq/L Final  . Chloride 08/28/2013 98  96 - 112 mEq/L Final  . CO2 08/28/2013 31  19 - 32 mEq/L Final  . Glucose, Bld 08/28/2013 98  70 - 99 mg/dL Final  . BUN 08/28/2013 80* 6 - 23 mg/dL Final  . Creatinine, Ser 08/28/2013 2.77* 0.50 - 1.10 mg/dL Final  . Calcium 08/28/2013 8.7  8.4 - 10.5 mg/dL Final  . GFR calc non Af Amer 08/28/2013 14* >90 mL/min Final  . GFR calc Af Amer 08/28/2013 17* >90 mL/min Final   Comment: (NOTE)                          The eGFR has been calculated using the CKD EPI equation.                          This calculation has not been validated in all clinical situations.                          eGFR's persistently <90 mL/min signify possible Chronic Kidney                          Disease.  . Glucose-Capillary 08/27/2013 156* 70 - 99 mg/dL Final  . Glucose-Capillary 08/28/2013 76  70 - 99 mg/dL Final  . Comment 1 08/28/2013 Notify RN   Final  . Glucose-Capillary 08/28/2013 111* 70 - 99 mg/dL Final  . Comment 1 08/28/2013 Notify RN   Final  . Glucose-Capillary 08/28/2013 173* 70 - 99 mg/dL Final  . Comment 1  08/28/2013 Notify RN   Final  . Glucose-Capillary 08/28/2013 224* 70 - 99 mg/dL Final  . Glucose-Capillary 08/29/2013 85  70 - 99 mg/dL Final  . Glucose-Capillary 08/29/2013 115* 70 - 99 mg/dL Final  . Comment 1 08/29/2013 Notify RN   Final  . Glucose-Capillary 08/29/2013 114* 70 - 99 mg/dL Final  . Glucose-Capillary 08/29/2013 161* 70 - 99 mg/dL Final  . Glucose-Capillary 08/30/2013 98  70 - 99 mg/dL Final  . Glucose-Capillary 08/30/2013 136* 70 - 99 mg/dL Final  . Comment 1 08/30/2013  Notify RN   Final  Admission on 08/09/2013, Discharged on 08/14/2013  Component Date Value Ref Range Status  . Troponin I 08/09/2013 <0.30  <0.30 ng/mL Final   Comment:                                 Due to the release kinetics of cTnI,                          a negative result within the first hours                          of the onset of symptoms does not rule out                          myocardial infarction with certainty.                          If myocardial infarction is still suspected,                          repeat the test at appropriate intervals.  . Pro B Natriuretic peptide (BNP) 08/09/2013 10577.0* 0 - 450 pg/mL Final  . Sodium 08/09/2013 140  137 - 147 mEq/L Final  . Potassium 08/09/2013 4.4  3.7 - 5.3 mEq/L Final  . Chloride 08/09/2013 97  96 - 112 mEq/L Final  . CO2 08/09/2013 29  19 - 32 mEq/L Final  . Glucose, Bld 08/09/2013 118* 70 - 99 mg/dL Final  . BUN 08/09/2013 72* 6 - 23 mg/dL Final  . Creatinine, Ser 08/09/2013 2.67* 0.50 - 1.10 mg/dL Final  . Calcium 08/09/2013 9.8  8.4 - 10.5 mg/dL Final  . Total Protein 08/09/2013 7.3  6.0 - 8.3 g/dL Final  . Albumin 08/09/2013 3.2* 3.5 - 5.2 g/dL Final  . AST 08/09/2013 16  0 - 37 U/L Final  . ALT 08/09/2013 9  0 - 35 U/L Final  . Alkaline Phosphatase 08/09/2013 85  39 - 117 U/L Final  . Total Bilirubin 08/09/2013 0.5  0.3 - 1.2 mg/dL Final  . GFR calc non Af Amer 08/09/2013 15* >90 mL/min Final  . GFR calc Af Amer 08/09/2013  17* >90 mL/min Final   Comment: (NOTE)                          The eGFR has been calculated using the CKD EPI equation.                          This calculation has not been validated in all clinical situations.                          eGFR's persistently <90 mL/min signify possible Chronic Kidney                          Disease.  . WBC 08/09/2013 4.2  4.0 - 10.5 K/uL Final  . RBC 08/09/2013 2.89* 3.87 - 5.11 MIL/uL Final  . Hemoglobin 08/09/2013 9.8* 12.0 - 15.0 g/dL Final  . HCT 08/09/2013 29.7* 36.0 - 46.0 % Final  . MCV 08/09/2013 102.8*  78.0 - 100.0 fL Final  . MCH 08/09/2013 33.9  26.0 - 34.0 pg Final  . MCHC 08/09/2013 33.0  30.0 - 36.0 g/dL Final  . RDW 08/09/2013 15.6* 11.5 - 15.5 % Final  . Platelets 08/09/2013 183  150 - 400 K/uL Final  . Neutrophils Relative % 08/09/2013 66  43 - 77 % Final  . Neutro Abs 08/09/2013 2.7  1.7 - 7.7 K/uL Final  . Lymphocytes Relative 08/09/2013 9* 12 - 46 % Final  . Lymphs Abs 08/09/2013 0.4* 0.7 - 4.0 K/uL Final  . Monocytes Relative 08/09/2013 17* 3 - 12 % Final  . Monocytes Absolute 08/09/2013 0.7  0.1 - 1.0 K/uL Final  . Eosinophils Relative 08/09/2013 8* 0 - 5 % Final  . Eosinophils Absolute 08/09/2013 0.3  0.0 - 0.7 K/uL Final  . Basophils Relative 08/09/2013 1  0 - 1 % Final  . Basophils Absolute 08/09/2013 0.0  0.0 - 0.1 K/uL Final  . Lipase 08/09/2013 32  11 - 59 U/L Final  . Color, Urine 08/09/2013 YELLOW  YELLOW Final  . APPearance 08/09/2013 TURBID* CLEAR Final  . Specific Gravity, Urine 08/09/2013 1.015  1.005 - 1.030 Final  . pH 08/09/2013 5.0  5.0 - 8.0 Final  . Glucose, UA 08/09/2013 NEGATIVE  NEGATIVE mg/dL Final  . Hgb urine dipstick 08/09/2013 LARGE* NEGATIVE Final  . Bilirubin Urine 08/09/2013 NEGATIVE  NEGATIVE Final  . Ketones, ur 08/09/2013 NEGATIVE  NEGATIVE mg/dL Final  . Protein, ur 08/09/2013 NEGATIVE  NEGATIVE mg/dL Final  . Urobilinogen, UA 08/09/2013 0.2  0.0 - 1.0 mg/dL Final  . Nitrite 08/09/2013  NEGATIVE  NEGATIVE Final  . Leukocytes, UA 08/09/2013 MODERATE* NEGATIVE Final  . Squamous Epithelial / LPF 08/09/2013 FEW* RARE Final  . WBC, UA 08/09/2013 TOO NUMEROUS TO COUNT  <3 WBC/hpf Final  . RBC / HPF 08/09/2013 TOO NUMEROUS TO COUNT  <3 RBC/hpf Final  . Bacteria, UA 08/09/2013 MANY* RARE Final  . Specimen Description 08/09/2013 URINE, CATHETERIZED   Final  . Special Requests 08/09/2013 NONE   Final  . Culture  Setup Time 08/09/2013    Final                   Value:08/10/2013 01:08                         Performed at [facility]  . Colony Count 08/09/2013    Final                   Value:>=100,000 COLONIES/ML                         Performed at [facility]  . Culture 08/09/2013    Final                   Value:PSEUDOMONAS AERUGINOSA                         Performed at [facility]  . Report Status 08/09/2013 08/12/2013 FINAL   Final  . Organism ID, Bacteria 08/09/2013 PSEUDOMONAS AERUGINOSA   Final  . Glucose-Capillary 08/09/2013 113* 70 - 99 mg/dL Final  . Comment 1 08/09/2013 Notify RN   Final  . Sodium 08/10/2013 142  137 - 147 mEq/L Final  . Potassium 08/10/2013 3.9  3.7 - 5.3 mEq/L Final  . Chloride 08/10/2013 99  96 -  112 mEq/L Final  . CO2 08/10/2013 30  19 - 32 mEq/L Final  . Glucose, Bld 08/10/2013 98  70 - 99 mg/dL Final  . BUN 08/10/2013 68* 6 - 23 mg/dL Final  . Creatinine, Ser 08/10/2013 2.53* 0.50 - 1.10 mg/dL Final  . Calcium 08/10/2013 9.6  8.4 - 10.5 mg/dL Final  . GFR calc non Af Amer 08/10/2013 16* >90 mL/min Final  . GFR calc Af Amer 08/10/2013 19* >90 mL/min Final   Comment: (NOTE)                          The eGFR has been calculated using the CKD EPI equation.                          This calculation has not been validated in all clinical situations.                          eGFR's persistently <90 mL/min signify possible Chronic Kidney                          Disease.  . WBC 08/10/2013 4.3  4.0 - 10.5 K/uL Final  .  RBC 08/10/2013 2.82* 3.87 - 5.11 MIL/uL Final  . Hemoglobin 08/10/2013 9.6* 12.0 - 15.0 g/dL Final  . HCT 08/10/2013 29.3* 36.0 - 46.0 % Final  . MCV 08/10/2013 103.9* 78.0 - 100.0 fL Final  . MCH 08/10/2013 34.0  26.0 - 34.0 pg Final  . MCHC 08/10/2013 32.8  30.0 - 36.0 g/dL Final  . RDW 08/10/2013 15.6* 11.5 - 15.5 % Final  . Platelets 08/10/2013 176  150 - 400 K/uL Final  . Glucose-Capillary 08/09/2013 147* 70 - 99 mg/dL Final  . Comment 1 08/09/2013 Notify RN   Final  . Glucose-Capillary 08/09/2013 146* 70 - 99 mg/dL Final  . Comment 1 08/09/2013 Notify RN   Final  . Glucose-Capillary 08/10/2013 82  70 - 99 mg/dL Final  . Glucose-Capillary 08/10/2013 82  70 - 99 mg/dL Final  . Sodium 08/11/2013 143  137 - 147 mEq/L Final  . Potassium 08/11/2013 3.3* 3.7 - 5.3 mEq/L Final  . Chloride 08/11/2013 100  96 - 112 mEq/L Final  . CO2 08/11/2013 32  19 - 32 mEq/L Final  . Glucose, Bld 08/11/2013 121* 70 - 99 mg/dL Final  . BUN 08/11/2013 62* 6 - 23 mg/dL Final  . Creatinine, Ser 08/11/2013 2.32* 0.50 - 1.10 mg/dL Final  . Calcium 08/11/2013 9.0  8.4 - 10.5 mg/dL Final  . GFR calc non Af Amer 08/11/2013 18* >90 mL/min Final  . GFR calc Af Amer 08/11/2013 21* >90 mL/min Final   Comment: (NOTE)                          The eGFR has been calculated using the CKD EPI equation.                          This calculation has not been validated in all clinical situations.                          eGFR's persistently <90 mL/min signify possible Chronic Kidney  Disease.  . Glucose-Capillary 08/10/2013 111* 70 - 99 mg/dL Final  . Glucose-Capillary 08/10/2013 182* 70 - 99 mg/dL Final  . Comment 1 08/10/2013 Notify RN   Final  . Glucose-Capillary 08/11/2013 104* 70 - 99 mg/dL Final  . Comment 1 08/11/2013 Notify RN   Final  . Glucose-Capillary 08/11/2013 93  70 - 99 mg/dL Final  . Glucose-Capillary 08/11/2013 135* 70 - 99 mg/dL Final  . Comment 1 08/11/2013 Notify RN   Final    . Glucose-Capillary 08/11/2013 138* 70 - 99 mg/dL Final  . Comment 1 08/11/2013 Notify RN   Final  . Glucose-Capillary 08/12/2013 139* 70 - 99 mg/dL Final  . Comment 1 08/12/2013 Notify RN   Final  . Glucose-Capillary 08/12/2013 145* 70 - 99 mg/dL Final  . Comment 1 08/12/2013 Notify RN   Final  . Comment 2 08/12/2013 Documented in Chart   Final  . Sodium 08/13/2013 142  137 - 147 mEq/L Final  . Potassium 08/13/2013 3.8  3.7 - 5.3 mEq/L Final  . Chloride 08/13/2013 98  96 - 112 mEq/L Final  . CO2 08/13/2013 31  19 - 32 mEq/L Final  . Glucose, Bld 08/13/2013 124* 70 - 99 mg/dL Final  . BUN 08/13/2013 65* 6 - 23 mg/dL Final  . Creatinine, Ser 08/13/2013 2.06* 0.50 - 1.10 mg/dL Final  . Calcium 08/13/2013 8.9  8.4 - 10.5 mg/dL Final  . GFR calc non Af Amer 08/13/2013 21* >90 mL/min Final  . GFR calc Af Amer 08/13/2013 24* >90 mL/min Final   Comment: (NOTE)                          The eGFR has been calculated using the CKD EPI equation.                          This calculation has not been validated in all clinical situations.                          eGFR's persistently <90 mL/min signify possible Chronic Kidney                          Disease.  . Glucose-Capillary 08/12/2013 143* 70 - 99 mg/dL Final  . Comment 1 08/12/2013 Notify RN   Final  . Glucose-Capillary 08/12/2013 185* 70 - 99 mg/dL Final  . Comment 1 08/12/2013 Documented in Chart   Final  . Comment 2 08/12/2013 Notify RN   Final  . Glucose-Capillary 08/13/2013 104* 70 - 99 mg/dL Final  . Glucose-Capillary 08/13/2013 119* 70 - 99 mg/dL Final  . Glucose-Capillary 08/13/2013 149* 70 - 99 mg/dL Final  . Glucose-Capillary 08/13/2013 194* 70 - 99 mg/dL Final  . Glucose-Capillary 08/14/2013 152* 70 - 99 mg/dL Final  . Glucose-Capillary 08/14/2013 112* 70 - 99 mg/dL Final  Admission on 07/27/2013, Discharged on 08/02/2013  No results displayed because visit has over 200 results.      PATHOLOGY:***  Urinalysis     Component Value Date/Time   COLORURINE YELLOW 08/09/2013 0850   APPEARANCEUR TURBID* 08/09/2013 0850   LABSPEC 1.015 08/09/2013 0850   PHURINE 5.0 08/09/2013 0850   GLUCOSEU NEGATIVE 08/09/2013 0850   HGBUR LARGE* 08/09/2013 0850   BILIRUBINUR NEGATIVE 08/09/2013 0850   KETONESUR NEGATIVE 08/09/2013 0850   PROTEINUR NEGATIVE 08/09/2013 0850   UROBILINOGEN 0.2 08/09/2013  0850   NITRITE NEGATIVE 08/09/2013 0850   LEUKOCYTESUR MODERATE* 08/09/2013 0850    RADIOGRAPHIC STUDIES: Dg Chest Portable 1 View  08/23/2013   CLINICAL DATA:  Rectal bleeding and CHF.  EXAM: PORTABLE CHEST - 1 VIEW  COMPARISON:  08/19/2013  FINDINGS: Mild elevation of the right hemidiaphragm is unchanged. Heart size is upper limits of normal but unchanged. The thoracic aortic arch is heavily calcified. There are old right rib fractures. Mild haziness in both lungs have not significantly changed. No evidence for pulmonary edema. No focal airspace disease.  IMPRESSION: No acute chest abnormality.  Atherosclerotic disease.   Electronically Signed   By: Markus Daft M.D.   On: 08/23/2013 14:23   Dg Chest Portable 1 View  08/19/2013   CLINICAL DATA:  Abdominal swelling.  Leg swelling.  EXAM: PORTABLE CHEST - 1 VIEW  COMPARISON:  08/09/2013  FINDINGS: Enlarged cardiopericardial silhouette, similar to prior. Atherosclerotic aortic arch. Indistinct pulmonary vasculature. . Stable arthropathy in both glenohumeral joints. Old right rib fractures.  IMPRESSION: 1. Enlarged cardiopericardial silhouette with indistinct pulmonary vasculature favoring pulmonary venous hypertension. 2. Atherosclerosis.   Electronically Signed   By: Sherryl Barters M.D.   On: 08/19/2013 13:46   Dg Chest Port 1 View  08/09/2013   CLINICAL DATA:  Leg swelling.  EXAM: PORTABLE CHEST - 1 VIEW  COMPARISON:  July 27, 2013.  FINDINGS: Stable cardiomegaly. No pneumothorax is noted. No significant pleural effusion is noted. Linear density is noted in right lung base most  consistent with subsegmental atelectasis. Mild central pulmonary vascular congestion is noted. This is improved compared to prior exam. Degenerative changes are seen involving the acromioclavicular joints bilaterally.  IMPRESSION: Stable cardiomegaly. Central pulmonary vascular congestion noted on prior exam is significantly improved. Minimal right apical subsegmental atelectasis is noted.   Electronically Signed   By: Sabino Dick M.D.   On: 08/09/2013 08:38    ASSESSMENT: ***   PLAN: ***   All questions were answered. The patient knows to call the clinic with any problems, questions or concerns. We can certainly see the patient much sooner if necessary.   I spent {CHL ONC TIME VISIT - PPJKD:[id number]} counseling the patient face to face. The total time spent in the appointment was {CHL ONC TIME VISIT - DXIPJ:[id number]}.    Farrel Gobble, MD 08/30/2013 1:44 PM  DISCLAIMER:  This note was dictated with voice recognition software.  Similar sounding words can inadvertently be transcribed inaccurately and may not be corrected upon review.   This encounter was created in error - please disregard.

## 2013-08-30 NOTE — Progress Notes (Unsigned)
The patient arrived from the floor for her office visit.  The patient was unable to speak in clear sentences, sats on 3L of oxygen was 88-89%.  Rn notified the nurse caring for the patient on the floor. RN spoke to Dr.Formanek who requested to patient return to the department she arrived from for further treatment.  We will reschedule the patient at a later time.

## 2013-08-30 NOTE — Progress Notes (Signed)
INITIAL NUTRITION ASSESSMENT  DOCUMENTATION CODES Per approved criteria  -Non-severe (moderate) malnutrition in the context of chronic illness   INTERVENTION: Recommend Low Sodium diet to help decrease fluid retention . She is currently on CHO Modified and glucose is WNL. Recommend pt be discharged with ProStat 30 ml BID.  NUTRITION DIAGNOSIS: Malnturition related to chronic disease progression as evidenced by mild-moderate muscle wasting and fluid accumulation.   Goal: Meet nutrition needs as able.  Monitor:  Po intake, labs and wt trends  Reason for Assessment: Length of stay  80 y.o. female  Admitting Dx: acute on chronic heart failure  ASSESSMENT: RD drawn to pt due to LOS. She has hx of CHF, CKD. GI bleed. Catherine [name] is in process of being discharged back to [facility] today. She reports good appetite and says she is consuming 50-75% of recent meals.  She has physical evidence of malnutrition and is being referred for Hospice evaluation. Significant weight gain this month 8#, 6% noted.  Nutrition Focused Physical Exam:  Subcutaneous Fat:  Orbital Region: moderate loss Upper Arm Region: mild loss Thoracic and Lumbar Region: N/A  Muscle:  Temple Region: moderate wasting Clavicle Bone Region: severe wasting Clavicle and Acromion Bone Region: mild wasting Scapular Bone Region: mild wasting Dorsal Hand: severe wasting Patellar Region: mild-moderate wasting Anterior Thigh Region: WDL Posterior Calf Region: WDL  Edema: non-pitting   Height: Ht Readings from Last 1 Encounters:  08/23/13 5\' 3"  (1.6 m)    Weight: Wt Readings from Last 1 Encounters:  08/30/13 171 lb 11.8 oz (77.9 kg)    Ideal Body Weight: 115#  % Ideal Body Weight: 150%  Wt Readings from Last 10 Encounters:  08/30/13 171 lb 11.8 oz (77.9 kg)  08/19/13 166 lb (75.297 kg)  08/13/13 172 lb 9.6 oz (78.291 kg)  08/02/13 164 lb 0.4 oz (74.4 kg)  06/26/13 158 lb 1.1 oz (71.7 kg)  06/10/13 165 lb  (74.844 kg)  05/18/13 147 lb (66.679 kg)  04/20/13 148 lb (67.132 kg)  03/22/13 147 lb 4 oz (66.792 kg)  02/24/13 153 lb (69.4 kg)    Usual Body Weight: 150-160#  % Usual Body Weight: 107%  BMI:  Body mass index is 30.43 kg/(m^2).skewed due to fluid changes  Estimated Nutritional Needs: Kcal: 1500-1700 Protein: 70-80 gr  Fluid: per MD goals  Skin: no new  issues noted  Diet Order: Carb Control  EDUCATION NEEDS: -Education needs addressed   Intake/Output Summary (Last 24 hours) at 08/30/13 1306 Last data filed at 08/30/13 0439  Gross per 24 hour  Intake    480 ml  Output   1200 ml  Net   -720 ml    Last BM:  08/29/13  Labs:   Recent Labs Lab 08/25/13 0502 08/26/13 0457 08/28/13 0555  NA 140 142 140  K 4.3 3.7 3.8  CL 99 99 98  CO2 29 32 31  BUN 77* 73* 80*  CREATININE 2.99* 2.74* 2.77*  CALCIUM 9.6 9.4 8.7  GLUCOSE 117* 85 98    CBG (last 3)   Recent Labs  08/29/13 2127 08/30/13 0743 08/30/13 1146  GLUCAP 161* 98 136*    Scheduled Meds: . albuterol  2.5 mg Nebulization TID  . calcium gluconate  1 g Intravenous Once  . citalopram  20 mg Oral Daily  . diltiazem  60 mg Oral Q12H  . fluocinonide cream  1 application Topical Daily  . furosemide  40 mg Intravenous BID  . insulin aspart  0-15 Units Subcutaneous TID WC  . insulin aspart  0-5 Units Subcutaneous QHS  . insulin glargine  6 Units Subcutaneous QHS  . metoprolol tartrate  25 mg Oral BID  . pantoprazole  40 mg Oral Daily  . potassium chloride  10 mEq Oral BID  . sodium chloride  3 mL Intravenous Q12H  . triamcinolone cream  1 application Topical Daily    Continuous Infusions:   Past Medical History  Diagnosis Date  . Diastolic heart failure     LVEF 60%  . Type 2 diabetes mellitus   . Essential hypertension, benign   . COPD (chronic obstructive pulmonary disease)     Home O2  . Vertigo   . Breast cancer   . Stroke      Patient denies  . Skin disorder     Epidermolysis  bullosa.  . Pulmonary hypertension   . Multiple thyroid nodules   . Anemia   . Aortic stenosis   . Monoclonal gammopathy   . Diverticulosis   . Squamous cell carcinoma   . Osteoporosis   . CKD (chronic kidney disease) stage 4, GFR 15-29 ml/min   . Atrial fibrillation   . Mitral regurgitation     Moderate with severe left atrial enlargement    Past Surgical History  Procedure Laterality Date  . Cholecystectomy    . Hemorrhoid surgery    . Abdominal hysterectomy  1991    Complete hysterectomy for cancer  . Mastectomy  2005    Left breast, tamoxifin  . Goiter    . Hip fracture surgery  2010    Right hip replacement  . Hip fracture surgery  2006    Left hip replacement  . Colonoscopy  ?  Marland Kitchen Cataract extraction, bilateral      Colman Cater Catherine,RD,CSG,LDN Office: [phone] Pager: [phone]

## 2013-08-30 NOTE — Care Management Note (Signed)
    Page 1 of 1   08/26/2013     5:29:06 PM CARE MANAGEMENT NOTE 08/26/2013  Patient:  Catherine [name], Catherine [name]   Account Number:  [id number]  Date Initiated:  08/24/2013  Documentation initiated by:  Vladimir Creeks  Subjective/Objective Assessment:   Admitted with CHF, CKD, K+ 6.6. Pt is from [facility], and would like to return there.     Action/Plan:   CSW aware of admission and will follow   Anticipated DC Date:  08/25/2013   Anticipated DC Plan:  [facility]  In-house referral  Clinical Social Worker      DC Forensic scientist  CM consult      [facility] Choice  HOME HEALTH   Choice offered to / List presented to:  C-1 Patient        [place] arranged  HH-1 RN  [place].   Status of service:  In process, will continue to follow Medicare Important Message given?  YES (If response is "NO", the following Medicare IM given date fields will be blank) Date Medicare IM given:  08/25/2013 Date Additional Medicare IM given:    Discharge Disposition:    Per UR Regulation:  Reviewed for med. necessity/level of care/duration of stay  If discussed at [place] of Stay Meetings, dates discussed:    Comments:  08/26/13 [place] RN/CM Pt would like to continue with [facility]. States she likes them, and MD suggested she go to SNF, but she likes it [place] [facility] and would like to return there 08/24/13 1700 [employer] RN/CM

## 2013-08-30 NOTE — Progress Notes (Signed)
NAME:  Knapke, Jessee                        ACCOUNT NO.:  MEDICAL RECORD NO.:  [id number]  LOCATION:                                 FACILITY:  PHYSICIAN:  Paula Compton. Willey Blade, MD            DATE OF BIRTH:  DATE OF PROCEDURE:  08/26/2013 DATE OF DISCHARGE:                                PROGRESS NOTE   SUBJECTIVE:  Ms. Vanderploeg notes less swelling in her extremities.  She denies any bleeding.  OBJECTIVE:  VITAL SIGNS:  Temperature 97.5, pulse 85, blood pressure 104/53, oxygen saturation 93% on 3 L.  There is no recorded weight.  She has diuresed 1100 mL.  LUNGS:  Clear.  HEART:  Irregularly irregular with a grade 2 systolic murmur.  ABDOMEN:  Edema persists.  EXTREMITIES: Edema has improved in the arms and legs, but persists.  IMPRESSION/PLAN: 1. Acute on chronic diastolic heart failure, improving with IV Lasix.     Serum potassium is dropped to 3.7. 2. Chronic kidney disease, improved.  BUN and creatinine are down to     73 and 2.74 with a bicarb of 32. 3. GI bleed.  Xarelto has been discontinued.  We will defer additional     workup at this point, based on her overall condition.  This was     discussed with Gastroenterology yesterday.  Hemoglobin was 8.7     yesterday. 4. Pulmonary hypertension. 5. Mitral regurgitation/tricuspid regurgitation. 6. Chronic atrial fibrillation.  Rate is controlled with diltiazem and     metoprolol.     Paula Compton. Willey Blade, MD     ROF/MEDQ  D:  08/26/2013  T:  08/26/2013  Job:  [id number]

## 2013-08-30 NOTE — Progress Notes (Signed)
Pt's IV removed.  [facility] made aware that pt will be discharged and taken to f/u appointment at [facility] and to pick her up there. Pt dressed and taken to [place] with O2 via wheelchair by NT.  Pt stable at time of D/C.

## 2013-08-30 NOTE — Progress Notes (Signed)
NAME:  DOMINIQUE, RESSEL                   ACCOUNT NO.:  [id number]  MEDICAL RECORD NO.:  [id number]  LOCATION:  A306                          FACILITY:  [facility]  PHYSICIAN:  Paula Compton. Willey Blade, MD       DATE OF BIRTH:  [birth year]  DATE OF PROCEDURE:  08/29/2013 DATE OF DISCHARGE:                                PROGRESS NOTE   SUBJECTIVE:  Ajiah states that she feels better.  She has diuresed 1125 mL over the past day.  Her weight  was recorded at 170 yesterday which is questionable since her weight had been measured at 161 on Aug 27, 2013.  There is no weight recorded today.  OBJECTIVE:  VITAL SIGNS:  Temperature 98.7, pulse 100, blood pressure 112/67. LUNGS:  Clear. HEART:  Irregularly irregular with a grade 2 systolic murmur. ABDOMEN:  Mild distention. EXTREMITIES:  Persistent edema in the upper and lower extremities.  IMPRESSION/PLAN: 1. Congestive heart failure.  Continue IV Lasix. 2. Chronic atrial fibrillation.  Continue diltiazem and metoprolol.     No further Xarelto is planned. 3. Chronic kidney disease.  Creatinine 2.7. 4. Diabetes.  Continue Lantus.  Glucoses yesterday ranged from 76-224,     glucose this morning is 85.  We will plan to convert to oral Demodex possibly tomorrow.     Paula Compton. Willey Blade, MD     ROF/MEDQ  D:  08/29/2013  T:  08/30/2013  Job:  [id number]

## 2013-08-30 NOTE — Progress Notes (Signed)
NAME:  CARLIS, BURNSWORTH                   ACCOUNT NO.:  [id number]  MEDICAL RECORD NO.:  [id number]  LOCATION:                                 FACILITY:  PHYSICIAN:  Paula Compton. Willey Blade, MD       DATE OF BIRTH:  Dec 25, 1923  DATE OF PROCEDURE:  08/27/2013 DATE OF DISCHARGE:                                PROGRESS NOTE   SUBJECTIVE:  Ms. [place] is breathing comfortably.  She has not passed any blood.  She notes continued swelling of her extremities.  She has had a urine output of 1950 mL.  There is no recorded weight again.  OBJECTIVE:  VITAL SIGNS:  Temperature 97.4, pulse 98, blood pressure 122/74. LUNGS:  Clear. HEART:  Irregularly irregular with a grade 2 systolic murmur. ABDOMEN:  Mildly distended. EXTREMITIES:  She has significant persistent edema in her legs.  Arm edema is more improved.  IMPRESSION/PLAN: 1. Congestive heart failure.  Continue IV Lasix. 2. Chronic kidney disease.  Creatinine 2.7. 3. Pulmonary hypertension. 4. Gastrointestinal bleeding.  No further bleeding off Xarelto. 5. Diabetes.  Fasting glucose 101.  Continue low-dose Lantus. 6. Malnutrition.  Albumin 2.8.  She is eating well now. 7. Mitral regurgitation and tricuspid regurgitation. 8. Chronic atrial fibrillation.  Continue metoprolol and diltiazem.     Paula Compton. Willey Blade, MD     ROF/MEDQ  D:  08/27/2013  T:  08/28/2013  Job:  [id number]

## 2013-08-30 NOTE — Progress Notes (Addendum)
[facility] made aware to pick patient up from room 306.  Packet given to staff member, patient stable at this time.

## 2013-08-30 NOTE — Care Management Note (Signed)
    Page 1 of 1   08/30/2013     1:52:34 PM CARE MANAGEMENT NOTE 08/30/2013  Patient:  KIP, KAUTZMAN   Account Number:  [id number]  Date Initiated:  08/30/2013  Documentation initiated by:  Vladimir Creeks  Subjective/Objective Assessment:   Admitted with CHF. Pt is from [facility], and plans to return there. There was a question of SNF, but pt has refused, and wants to return to [facility]. She is used to being there and likes it and the people, and they are willing to     Action/Plan:   accept her back there.. She has agreed to Hospice care there, and [place].   Anticipated DC Date:  08/30/2013   Anticipated DC Plan:  [place]  In-house referral  Clinical Social Worker      DC Planning Services  CM consult      Choice offered to / List presented to:  C-1 Patient           [place] [facility] [place]   Status of service:  Completed, signed off Medicare Important Message given?  YES (If response is "NO", the following Medicare IM given date fields will be blank) Date Medicare IM given:  08/26/2013 Date Additional Medicare IM given:  08/30/2013  Discharge Disposition:  [place]  Per UR Regulation:  Reviewed for med. necessity/level of care/duration of stay  If discussed at [place] of Stay Meetings, dates discussed:    Comments:  08/30/13 [place] RN/CM Pt being D/C to [facility] with Hospice today

## 2013-09-01 ENCOUNTER — Ambulatory Visit (HOSPITAL_COMMUNITY): Payer: Medicare Other

## 2013-09-09 ENCOUNTER — Encounter (HOSPITAL_COMMUNITY): Payer: Medicare Other

## 2013-09-09 ENCOUNTER — Encounter (HOSPITAL_COMMUNITY): Payer: Self-pay

## 2013-09-09 ENCOUNTER — Encounter (HOSPITAL_COMMUNITY): Payer: Medicare Other | Attending: Hematology and Oncology

## 2013-09-09 VITALS — BP 111/72 | HR 83 | Temp 97.3°F | Resp 22

## 2013-09-09 DIAGNOSIS — I059 Rheumatic mitral valve disease, unspecified: Secondary | ICD-10-CM | POA: Insufficient documentation

## 2013-09-09 DIAGNOSIS — Z794 Long term (current) use of insulin: Secondary | ICD-10-CM | POA: Insufficient documentation

## 2013-09-09 DIAGNOSIS — D638 Anemia in other chronic diseases classified elsewhere: Secondary | ICD-10-CM

## 2013-09-09 DIAGNOSIS — E119 Type 2 diabetes mellitus without complications: Secondary | ICD-10-CM | POA: Insufficient documentation

## 2013-09-09 DIAGNOSIS — Z96649 Presence of unspecified artificial hip joint: Secondary | ICD-10-CM | POA: Insufficient documentation

## 2013-09-09 DIAGNOSIS — D631 Anemia in chronic kidney disease: Secondary | ICD-10-CM | POA: Diagnosis not present

## 2013-09-09 DIAGNOSIS — N039 Chronic nephritic syndrome with unspecified morphologic changes: Principal | ICD-10-CM

## 2013-09-09 DIAGNOSIS — Z85828 Personal history of other malignant neoplasm of skin: Secondary | ICD-10-CM | POA: Insufficient documentation

## 2013-09-09 DIAGNOSIS — I5030 Unspecified diastolic (congestive) heart failure: Secondary | ICD-10-CM | POA: Insufficient documentation

## 2013-09-09 DIAGNOSIS — I4891 Unspecified atrial fibrillation: Secondary | ICD-10-CM | POA: Insufficient documentation

## 2013-09-09 DIAGNOSIS — D649 Anemia, unspecified: Secondary | ICD-10-CM

## 2013-09-09 DIAGNOSIS — J449 Chronic obstructive pulmonary disease, unspecified: Secondary | ICD-10-CM | POA: Insufficient documentation

## 2013-09-09 DIAGNOSIS — K5730 Diverticulosis of large intestine without perforation or abscess without bleeding: Secondary | ICD-10-CM | POA: Insufficient documentation

## 2013-09-09 DIAGNOSIS — I2789 Other specified pulmonary heart diseases: Secondary | ICD-10-CM | POA: Insufficient documentation

## 2013-09-09 DIAGNOSIS — I517 Cardiomegaly: Secondary | ICD-10-CM | POA: Insufficient documentation

## 2013-09-09 DIAGNOSIS — N184 Chronic kidney disease, stage 4 (severe): Secondary | ICD-10-CM | POA: Insufficient documentation

## 2013-09-09 DIAGNOSIS — N189 Chronic kidney disease, unspecified: Secondary | ICD-10-CM

## 2013-09-09 DIAGNOSIS — I129 Hypertensive chronic kidney disease with stage 1 through stage 4 chronic kidney disease, or unspecified chronic kidney disease: Secondary | ICD-10-CM | POA: Insufficient documentation

## 2013-09-09 DIAGNOSIS — E042 Nontoxic multinodular goiter: Secondary | ICD-10-CM | POA: Insufficient documentation

## 2013-09-09 DIAGNOSIS — Z9981 Dependence on supplemental oxygen: Secondary | ICD-10-CM | POA: Insufficient documentation

## 2013-09-09 DIAGNOSIS — J4489 Other specified chronic obstructive pulmonary disease: Secondary | ICD-10-CM | POA: Insufficient documentation

## 2013-09-09 DIAGNOSIS — D472 Monoclonal gammopathy: Secondary | ICD-10-CM | POA: Insufficient documentation

## 2013-09-09 DIAGNOSIS — Z853 Personal history of malignant neoplasm of breast: Secondary | ICD-10-CM | POA: Insufficient documentation

## 2013-09-09 LAB — COMPREHENSIVE METABOLIC PANEL
ALT: 8 U/L (ref 0–35)
AST: 14 U/L (ref 0–37)
Albumin: 3.3 g/dL — ABNORMAL LOW (ref 3.5–5.2)
Alkaline Phosphatase: 90 U/L (ref 39–117)
BUN: 53 mg/dL — AB (ref 6–23)
CALCIUM: 9.4 mg/dL (ref 8.4–10.5)
CO2: 30 mEq/L (ref 19–32)
Chloride: 99 mEq/L (ref 96–112)
Creatinine, Ser: 1.78 mg/dL — ABNORMAL HIGH (ref 0.50–1.10)
GFR calc non Af Amer: 24 mL/min — ABNORMAL LOW (ref >90)
GFR, EST AFRICAN AMERICAN: 28 mL/min — AB (ref >90)
GLUCOSE: 150 mg/dL — AB (ref 70–99)
Potassium: 3.9 mEq/L (ref 3.7–5.3)
Sodium: 141 mEq/L (ref 137–147)
Total Bilirubin: 0.6 mg/dL (ref 0.3–1.2)
Total Protein: 7.5 g/dL (ref 6.0–8.3)

## 2013-09-09 LAB — CBC WITH DIFFERENTIAL/PLATELET
BASOS PCT: 0 % (ref 0–1)
Basophils Absolute: 0 10*3/uL (ref 0.0–0.1)
Eosinophils Absolute: 0.2 10*3/uL (ref 0.0–0.7)
Eosinophils Relative: 5 % (ref 0–5)
HCT: 30.9 % — ABNORMAL LOW (ref 36.0–46.0)
Hemoglobin: 9.8 g/dL — ABNORMAL LOW (ref 12.0–15.0)
LYMPHS ABS: 0.4 10*3/uL — AB (ref 0.7–4.0)
Lymphocytes Relative: 10 % — ABNORMAL LOW (ref 12–46)
MCH: 32.6 pg (ref 26.0–34.0)
MCHC: 31.7 g/dL (ref 30.0–36.0)
MCV: 102.7 fL — ABNORMAL HIGH (ref 78.0–100.0)
MONO ABS: 0.4 10*3/uL (ref 0.1–1.0)
Monocytes Relative: 9 % (ref 3–12)
Neutro Abs: 3.4 10*3/uL (ref 1.7–7.7)
Neutrophils Relative %: 76 % (ref 43–77)
PLATELETS: 209 10*3/uL (ref 150–400)
RBC: 3.01 MIL/uL — ABNORMAL LOW (ref 3.87–5.11)
RDW: 14.6 % (ref 11.5–15.5)
WBC: 4.5 10*3/uL (ref 4.0–10.5)

## 2013-09-09 MED ORDER — DARBEPOETIN ALFA-POLYSORBATE 200 MCG/0.4ML IJ SOLN
INTRAMUSCULAR | Status: AC
  Filled 2013-09-09: qty 0.4

## 2013-09-09 MED ORDER — DARBEPOETIN ALFA-POLYSORBATE 200 MCG/0.4ML IJ SOLN
200.0000 ug | Freq: Once | INTRAMUSCULAR | Status: AC
  Administered 2013-09-09: 200 ug via SUBCUTANEOUS

## 2013-09-09 NOTE — Progress Notes (Signed)
[place]  OFFICE PROGRESS Catherine Miner, MD [address] [place] [address]  DIAGNOSIS: Anemia - Plan: Comprehensive metabolic panel, Multiple myeloma panel, serum, Kappa/lambda light chains, CBC with Differential  MGUS (monoclonal gammopathy of unknown significance)  Chief Complaint  Patient presents with  . Anemia in chronic kidney disease  . MGUS    CURRENT THERAPY: Aranesp every 3 weeks if hemoglobin is less than 11 for anemia of chronic disease in chronic renal failure and erythropoietin deficiency.  INTERVAL HISTORY: Catherine [name] 80 y.o. female returns for followup of anemia of chronic disease in chronic renal failure receiving Aranesp if hemoglobin is less than 11 usually given at 3 week intervals.  Her last visit here was aborted because of severe shortness of breath. She had been an inpatient and apparently was discharged but no paperwork was done to fact that the result. She was sent back to the floor and treated appropriately and eventually discharged to a nursing home. Subsequently she was placed under hospice care. She feels short of breath on mild exertion with no nausea, vomiting, but with constipation for which she takes MiraLAX. She is short of breath despite oxygen 24 hours a day. She denies any pain.   MEDICAL HISTORY: Past Medical History  Diagnosis Date  . Diastolic heart failure     LVEF 60%  . Type 2 diabetes mellitus   . Essential hypertension, benign   . COPD (chronic obstructive pulmonary disease)     Home O2  . Vertigo   . Breast cancer   . Stroke      Patient denies  . Skin disorder     Epidermolysis bullosa.  . Pulmonary hypertension   . Multiple thyroid nodules   . Anemia   . Aortic stenosis   . Monoclonal gammopathy   . Diverticulosis   . Squamous cell carcinoma   . Osteoporosis   . CKD (chronic kidney disease) stage 4, GFR 15-29 ml/min   . Atrial fibrillation   .  Mitral regurgitation     Moderate with severe left atrial enlargement    INTERIM HISTORY: has Anemia of other chronic disease; Chronic diastolic CHF (congestive heart failure); Community acquired pneumonia; Hypertension; Atrial fibrillation; Chronic kidney disease (CKD) stage G4/A1, severely decreased glomerular filtration rate (GFR) between 15-29 mL/min/1.73 square meter and albuminuria creatinine ratio less than 30 mg/g; Chronic cor pulmonale; Fever, unspecified; Pleural effusion; Nausea with vomiting; Chronic respiratory failure; Thrombocytopenia; BRBPR (bright red blood per rectum); Anemia associated with acute blood loss; Diabetic ulcer of lower extremity; MGUS (monoclonal gammopathy of unknown significance); Neurodermatitis; Diverticulosis; Diabetes; Osteoporosis, unspecified; CHF (congestive heart failure); Diastolic CHF, acute on chronic; Chronic indwelling Foley catheter; Chronic respiratory failure with hypoxia; CHF, acute on chronic; Acute on chronic diastolic heart failure; Mitral regurgitation; UTI (lower urinary tract infection); Epidermolysis bullosa; Hyperkalemia; and Heme positive stool on her problem list.    ALLERGIES:  is allergic to amoxicillin-pot clavulanate.  MEDICATIONS: has a current medication list which includes the following prescription(s): acetaminophen, citalopram, clonazepam, diltiazem, ferrous sulfate, fluocinonide cream, insulin glargine, metoprolol tartrate, morphine, pantoprazole, polyethylene glycol, potassium chloride, torsemide, triamcinolone cream, albuterol, and cyanocobalamin.  SURGICAL HISTORY:  Past Surgical History  Procedure Laterality Date  . Cholecystectomy    . Hemorrhoid surgery    . Abdominal hysterectomy  1991    Complete hysterectomy for cancer  . Mastectomy  2005    Left breast, tamoxifin  .  Goiter    . Hip fracture surgery  2010    Right hip replacement  . Hip fracture surgery  2006    Left hip replacement  . Colonoscopy  ?  Marland Kitchen Cataract  extraction, bilateral      FAMILY HISTORY: family history includes Diabetes in her mother; Heart attack in her son. There is no history of Colon cancer.  SOCIAL HISTORY:  reports that she has never smoked. She has never used smokeless tobacco. She reports that she does not drink alcohol or use illicit drugs.  REVIEW OF SYSTEMS:  Other than that discussed above is noncontributory.  PHYSICAL EXAMINATION: ECOG PERFORMANCE STATUS: 4 - Bedbound  Blood pressure 111/72, pulse 83, temperature 97.3 F (36.3 C), temperature source Oral, resp. rate 22, weight 0 lb (0 kg).  GENERAL:alert, no distress and comfortable. Cyanotic at rest. SKIN: skin color, texture, turgor are normal, no rashes or significant lesions EYES: PERLA; Conjunctiva are pink and non-injected, sclera clear SINUSES: No redness or tenderness over maxillary or ethmoid sinuses OROPHARYNX:no exudate, no erythema on lips, buccal mucosa, or tongue. NECK: supple, thyroid normal size, non-tender, without nodularity. No masses CHEST: Increased AP diameter. LYMPH:  no palpable lymphadenopathy in the cervical, axillary or inguinal LUNGS: clear to auscultation and percussion with normal breathing effort. Bilateral lower lobe rhonchi HEART: Irregularly irregular with no murmurs. No S3. ABDOMEN:abdomen soft, non-tender and normal bowel sounds MUSCULOSKELETAL:no cyanosis of digits and no clubbing. Range of motion normal. +1 lower 70 edema. NEURO: alert & oriented x 3 with fluent speech, no focal motor/sensory deficits   LABORATORY DATA: Office Visit on 09/09/2013  Component Date Value Ref Range Status  . WBC 09/09/2013 4.5  4.0 - 10.5 K/uL Final  . RBC 09/09/2013 3.01* 3.87 - 5.11 MIL/uL Final  . Hemoglobin 09/09/2013 9.8* 12.0 - 15.0 g/dL Final  . HCT 09/09/2013 30.9* 36.0 - 46.0 % Final  . MCV 09/09/2013 102.7* 78.0 - 100.0 fL Final  . MCH 09/09/2013 32.6  26.0 - 34.0 pg Final  . MCHC 09/09/2013 31.7  30.0 - 36.0 g/dL Final  . RDW  09/09/2013 14.6  11.5 - 15.5 % Final  . Platelets 09/09/2013 209  150 - 400 K/uL Final  . Neutrophils Relative % 09/09/2013 76  43 - 77 % Final  . Neutro Abs 09/09/2013 3.4  1.7 - 7.7 K/uL Final  . Lymphocytes Relative 09/09/2013 10* 12 - 46 % Final  . Lymphs Abs 09/09/2013 0.4* 0.7 - 4.0 K/uL Final  . Monocytes Relative 09/09/2013 9  3 - 12 % Final  . Monocytes Absolute 09/09/2013 0.4  0.1 - 1.0 K/uL Final  . Eosinophils Relative 09/09/2013 5  0 - 5 % Final  . Eosinophils Absolute 09/09/2013 0.2  0.0 - 0.7 K/uL Final  . Basophils Relative 09/09/2013 0  0 - 1 % Final  . Basophils Absolute 09/09/2013 0.0  0.0 - 0.1 K/uL Final  Admission on 08/23/2013, Discharged on 08/30/2013  Component Date Value Ref Range Status  . WBC 08/23/2013 6.4  4.0 - 10.5 K/uL Final  . RBC 08/23/2013 3.00* 3.87 - 5.11 MIL/uL Final  . Hemoglobin 08/23/2013 10.0* 12.0 - 15.0 g/dL Final  . HCT 08/23/2013 31.1* 36.0 - 46.0 % Final  . MCV 08/23/2013 103.7* 78.0 - 100.0 fL Final  . MCH 08/23/2013 33.3  26.0 - 34.0 pg Final  . MCHC 08/23/2013 32.2  30.0 - 36.0 g/dL Final  . RDW 08/23/2013 14.9  11.5 - 15.5 % Final  . Platelets  08/23/2013 199  150 - 400 K/uL Final  . Neutrophils Relative % 08/23/2013 79* 43 - 77 % Final  . Neutro Abs 08/23/2013 5.1  1.7 - 7.7 K/uL Final  . Lymphocytes Relative 08/23/2013 5* 12 - 46 % Final  . Lymphs Abs 08/23/2013 0.3* 0.7 - 4.0 K/uL Final  . Monocytes Relative 08/23/2013 12  3 - 12 % Final  . Monocytes Absolute 08/23/2013 0.8  0.1 - 1.0 K/uL Final  . Eosinophils Relative 08/23/2013 4  0 - 5 % Final  . Eosinophils Absolute 08/23/2013 0.2  0.0 - 0.7 K/uL Final  . Basophils Relative 08/23/2013 0  0 - 1 % Final  . Basophils Absolute 08/23/2013 0.0  0.0 - 0.1 K/uL Final  . Sodium 08/23/2013 136* 137 - 147 mEq/L Final  . Potassium 08/23/2013 6.6* 3.7 - 5.3 mEq/L Final   Comment: CRITICAL RESULT CALLED TO, READ BACK BY AND VERIFIED WITH:                          Junious Silk AT 1332 BY  HUFFINES,S ON 08/23/13  . Chloride 08/23/2013 96  96 - 112 mEq/L Final  . CO2 08/23/2013 27  19 - 32 mEq/L Final  . Glucose, Bld 08/23/2013 261* 70 - 99 mg/dL Final  . BUN 08/23/2013 76* 6 - 23 mg/dL Final  . Creatinine, Ser 08/23/2013 3.30* 0.50 - 1.10 mg/dL Final  . Calcium 08/23/2013 10.0  8.4 - 10.5 mg/dL Final  . GFR calc non Af Amer 08/23/2013 12* >90 mL/min Final  . GFR calc Af Amer 08/23/2013 13* >90 mL/min Final   Comment: (NOTE)                          The eGFR has been calculated using the CKD EPI equation.                          This calculation has not been validated in all clinical situations.                          eGFR's persistently <90 mL/min signify possible Chronic Kidney                          Disease.  . Pro B Natriuretic peptide (BNP) 08/23/2013 11032.0* 0 - 450 pg/mL Final  . Fecal Occult Bld 08/23/2013 POSITIVE* NEGATIVE Final  . Sodium 08/24/2013 139  137 - 147 mEq/L Final  . Potassium 08/24/2013 5.5* 3.7 - 5.3 mEq/L Final  . Chloride 08/24/2013 99  96 - 112 mEq/L Final  . CO2 08/24/2013 27  19 - 32 mEq/L Final  . Glucose, Bld 08/24/2013 206* 70 - 99 mg/dL Final  . BUN 08/24/2013 81* 6 - 23 mg/dL Final  . Creatinine, Ser 08/24/2013 3.06* 0.50 - 1.10 mg/dL Final  . Calcium 08/24/2013 9.7  8.4 - 10.5 mg/dL Final  . GFR calc non Af Amer 08/24/2013 13* >90 mL/min Final  . GFR calc Af Amer 08/24/2013 15* >90 mL/min Final   Comment: (NOTE)                          The eGFR has been calculated using the CKD EPI equation.  This calculation has not been validated in all clinical situations.                          eGFR's persistently <90 mL/min signify possible Chronic Kidney                          Disease.  . WBC 08/24/2013 4.7  4.0 - 10.5 K/uL Final  . RBC 08/24/2013 2.65* 3.87 - 5.11 MIL/uL Final  . Hemoglobin 08/24/2013 8.7* 12.0 - 15.0 g/dL Final  . HCT 08/24/2013 27.3* 36.0 - 46.0 % Final  . MCV 08/24/2013 103.0* 78.0 - 100.0  fL Final  . MCH 08/24/2013 32.8  26.0 - 34.0 pg Final  . MCHC 08/24/2013 31.9  30.0 - 36.0 g/dL Final  . RDW 08/24/2013 15.0  11.5 - 15.5 % Final  . Platelets 08/24/2013 170  150 - 400 K/uL Final  . Potassium 08/23/2013 6.1* 3.7 - 5.3 mEq/L Final  . Glucose-Capillary 08/23/2013 237* 70 - 99 mg/dL Final  . Comment 1 08/23/2013 Notify RN   Final  . Glucose-Capillary 08/24/2013 164* 70 - 99 mg/dL Final  . Glucose-Capillary 08/24/2013 155* 70 - 99 mg/dL Final  . WBC 08/25/2013 4.2  4.0 - 10.5 K/uL Final  . RBC 08/25/2013 2.68* 3.87 - 5.11 MIL/uL Final  . Hemoglobin 08/25/2013 8.7* 12.0 - 15.0 g/dL Final  . HCT 08/25/2013 27.3* 36.0 - 46.0 % Final  . MCV 08/25/2013 101.9* 78.0 - 100.0 fL Final  . MCH 08/25/2013 32.5  26.0 - 34.0 pg Final  . MCHC 08/25/2013 31.9  30.0 - 36.0 g/dL Final  . RDW 08/25/2013 14.9  11.5 - 15.5 % Final  . Platelets 08/25/2013 178  150 - 400 K/uL Final  . Sodium 08/25/2013 140  137 - 147 mEq/L Final  . Potassium 08/25/2013 4.3  3.7 - 5.3 mEq/L Final   DELTA CHECK NOTED  . Chloride 08/25/2013 99  96 - 112 mEq/L Final  . CO2 08/25/2013 29  19 - 32 mEq/L Final  . Glucose, Bld 08/25/2013 117* 70 - 99 mg/dL Final  . BUN 08/25/2013 77* 6 - 23 mg/dL Final  . Creatinine, Ser 08/25/2013 2.99* 0.50 - 1.10 mg/dL Final  . Calcium 08/25/2013 9.6  8.4 - 10.5 mg/dL Final  . GFR calc non Af Amer 08/25/2013 13* >90 mL/min Final  . GFR calc Af Amer 08/25/2013 15* >90 mL/min Final   Comment: (NOTE)                          The eGFR has been calculated using the CKD EPI equation.                          This calculation has not been validated in all clinical situations.                          eGFR's persistently <90 mL/min signify possible Chronic Kidney                          Disease.  . Glucose-Capillary 08/24/2013 141* 70 - 99 mg/dL Final  . Glucose-Capillary 08/24/2013 167* 70 - 99 mg/dL Final  . Glucose-Capillary 08/25/2013 135* 70 - 99 mg/dL Final  . Glucose-Capillary  08/25/2013 106* 70 - 99 mg/dL Final  .  Comment 1 08/25/2013 Notify RN   Final  . Albumin 08/25/2013 2.8* 3.5 - 5.2 g/dL Final  . Glucose-Capillary 08/25/2013 107* 70 - 99 mg/dL Final  . Comment 1 08/25/2013 Notify RN   Final  . Sodium 08/26/2013 142  137 - 147 mEq/L Final  . Potassium 08/26/2013 3.7  3.7 - 5.3 mEq/L Final  . Chloride 08/26/2013 99  96 - 112 mEq/L Final  . CO2 08/26/2013 32  19 - 32 mEq/L Final  . Glucose, Bld 08/26/2013 85  70 - 99 mg/dL Final  . BUN 08/26/2013 73* 6 - 23 mg/dL Final  . Creatinine, Ser 08/26/2013 2.74* 0.50 - 1.10 mg/dL Final  . Calcium 08/26/2013 9.4  8.4 - 10.5 mg/dL Final  . GFR calc non Af Amer 08/26/2013 15* >90 mL/min Final  . GFR calc Af Amer 08/26/2013 17* >90 mL/min Final   Comment: (NOTE)                          The eGFR has been calculated using the CKD EPI equation.                          This calculation has not been validated in all clinical situations.                          eGFR's persistently <90 mL/min signify possible Chronic Kidney                          Disease.  . Glucose-Capillary 08/25/2013 120* 70 - 99 mg/dL Final  . Comment 1 08/25/2013 Notify RN   Final  . Glucose-Capillary 08/25/2013 180* 70 - 99 mg/dL Final  . Comment 1 08/25/2013 Documented in Chart   Final  . Comment 2 08/25/2013 Notify RN   Final  . Glucose-Capillary 08/26/2013 82  70 - 99 mg/dL Final  . Glucose-Capillary 08/26/2013 123* 70 - 99 mg/dL Final  . Comment 1 08/26/2013 Notify RN   Final  . Glucose-Capillary 08/26/2013 144* 70 - 99 mg/dL Final  . Comment 1 08/26/2013 Notify RN   Final  . Glucose-Capillary 08/26/2013 139* 70 - 99 mg/dL Final  . Comment 1 08/26/2013 Documented in Chart   Final  . Comment 2 08/26/2013 Notify RN   Final  . Glucose-Capillary 08/27/2013 101* 70 - 99 mg/dL Final  . Comment 1 08/27/2013 Documented in Chart   Final  . Comment 2 08/27/2013 Notify RN   Final  . Glucose-Capillary 08/27/2013 133* 70 - 99 mg/dL Final  . Comment  1 08/27/2013 Documented in Chart   Final  . Comment 2 08/27/2013 Notify RN   Final  . Glucose-Capillary 08/27/2013 142* 70 - 99 mg/dL Final  . Comment 1 08/27/2013 Notify RN   Final  . Sodium 08/28/2013 140  137 - 147 mEq/L Final  . Potassium 08/28/2013 3.8  3.7 - 5.3 mEq/L Final  . Chloride 08/28/2013 98  96 - 112 mEq/L Final  . CO2 08/28/2013 31  19 - 32 mEq/L Final  . Glucose, Bld 08/28/2013 98  70 - 99 mg/dL Final  . BUN 08/28/2013 80* 6 - 23 mg/dL Final  . Creatinine, Ser 08/28/2013 2.77* 0.50 - 1.10 mg/dL Final  . Calcium 08/28/2013 8.7  8.4 - 10.5 mg/dL Final  . GFR calc non Af Amer 08/28/2013 14* >90 mL/min Final  .  GFR calc Af Amer 08/28/2013 17* >90 mL/min Final   Comment: (NOTE)                          The eGFR has been calculated using the CKD EPI equation.                          This calculation has not been validated in all clinical situations.                          eGFR's persistently <90 mL/min signify possible Chronic Kidney                          Disease.  . Glucose-Capillary 08/27/2013 156* 70 - 99 mg/dL Final  . Glucose-Capillary 08/28/2013 76  70 - 99 mg/dL Final  . Comment 1 08/28/2013 Notify RN   Final  . Glucose-Capillary 08/28/2013 111* 70 - 99 mg/dL Final  . Comment 1 08/28/2013 Notify RN   Final  . Glucose-Capillary 08/28/2013 173* 70 - 99 mg/dL Final  . Comment 1 08/28/2013 Notify RN   Final  . Glucose-Capillary 08/28/2013 224* 70 - 99 mg/dL Final  . Glucose-Capillary 08/29/2013 85  70 - 99 mg/dL Final  . Glucose-Capillary 08/29/2013 115* 70 - 99 mg/dL Final  . Comment 1 08/29/2013 Notify RN   Final  . Glucose-Capillary 08/29/2013 114* 70 - 99 mg/dL Final  . Glucose-Capillary 08/29/2013 161* 70 - 99 mg/dL Final  . Glucose-Capillary 08/30/2013 98  70 - 99 mg/dL Final  . Glucose-Capillary 08/30/2013 136* 70 - 99 mg/dL Final  . Comment 1 08/30/2013 Notify RN   Final  Admission on 08/09/2013, Discharged on 08/14/2013  Component Date Value Ref Range  Status  . Troponin I 08/09/2013 <0.30  <0.30 ng/mL Final   Comment:                                 Due to the release kinetics of cTnI,                          a negative result within the first hours                          of the onset of symptoms does not rule out                          myocardial infarction with certainty.                          If myocardial infarction is still suspected,                          repeat the test at appropriate intervals.  . Pro B Natriuretic peptide (BNP) 08/09/2013 10577.0* 0 - 450 pg/mL Final  . Sodium 08/09/2013 140  137 - 147 mEq/L Final  . Potassium 08/09/2013 4.4  3.7 - 5.3 mEq/L Final  . Chloride 08/09/2013 97  96 - 112 mEq/L Final  . CO2 08/09/2013 29  19 - 32 mEq/L Final  . Glucose, Bld 08/09/2013 118* 70 - 99 mg/dL Final  .  BUN 08/09/2013 72* 6 - 23 mg/dL Final  . Creatinine, Ser 08/09/2013 2.67* 0.50 - 1.10 mg/dL Final  . Calcium 08/09/2013 9.8  8.4 - 10.5 mg/dL Final  . Total Protein 08/09/2013 7.3  6.0 - 8.3 g/dL Final  . Albumin 08/09/2013 3.2* 3.5 - 5.2 g/dL Final  . AST 08/09/2013 16  0 - 37 U/L Final  . ALT 08/09/2013 9  0 - 35 U/L Final  . Alkaline Phosphatase 08/09/2013 85  39 - 117 U/L Final  . Total Bilirubin 08/09/2013 0.5  0.3 - 1.2 mg/dL Final  . GFR calc non Af Amer 08/09/2013 15* >90 mL/min Final  . GFR calc Af Amer 08/09/2013 17* >90 mL/min Final   Comment: (NOTE)                          The eGFR has been calculated using the CKD EPI equation.                          This calculation has not been validated in all clinical situations.                          eGFR's persistently <90 mL/min signify possible Chronic Kidney                          Disease.  . WBC 08/09/2013 4.2  4.0 - 10.5 K/uL Final  . RBC 08/09/2013 2.89* 3.87 - 5.11 MIL/uL Final  . Hemoglobin 08/09/2013 9.8* 12.0 - 15.0 g/dL Final  . HCT 08/09/2013 29.7* 36.0 - 46.0 % Final  . MCV 08/09/2013 102.8* 78.0 - 100.0 fL Final  . MCH 08/09/2013  33.9  26.0 - 34.0 pg Final  . MCHC 08/09/2013 33.0  30.0 - 36.0 g/dL Final  . RDW 08/09/2013 15.6* 11.5 - 15.5 % Final  . Platelets 08/09/2013 183  150 - 400 K/uL Final  . Neutrophils Relative % 08/09/2013 66  43 - 77 % Final  . Neutro Abs 08/09/2013 2.7  1.7 - 7.7 K/uL Final  . Lymphocytes Relative 08/09/2013 9* 12 - 46 % Final  . Lymphs Abs 08/09/2013 0.4* 0.7 - 4.0 K/uL Final  . Monocytes Relative 08/09/2013 17* 3 - 12 % Final  . Monocytes Absolute 08/09/2013 0.7  0.1 - 1.0 K/uL Final  . Eosinophils Relative 08/09/2013 8* 0 - 5 % Final  . Eosinophils Absolute 08/09/2013 0.3  0.0 - 0.7 K/uL Final  . Basophils Relative 08/09/2013 1  0 - 1 % Final  . Basophils Absolute 08/09/2013 0.0  0.0 - 0.1 K/uL Final  . Lipase 08/09/2013 32  11 - 59 U/L Final  . Color, Urine 08/09/2013 YELLOW  YELLOW Final  . APPearance 08/09/2013 TURBID* CLEAR Final  . Specific Gravity, Urine 08/09/2013 1.015  1.005 - 1.030 Final  . pH 08/09/2013 5.0  5.0 - 8.0 Final  . Glucose, UA 08/09/2013 NEGATIVE  NEGATIVE mg/dL Final  . Hgb urine dipstick 08/09/2013 LARGE* NEGATIVE Final  . Bilirubin Urine 08/09/2013 NEGATIVE  NEGATIVE Final  . Ketones, ur 08/09/2013 NEGATIVE  NEGATIVE mg/dL Final  . Protein, ur 08/09/2013 NEGATIVE  NEGATIVE mg/dL Final  . Urobilinogen, UA 08/09/2013 0.2  0.0 - 1.0 mg/dL Final  . Nitrite 08/09/2013 NEGATIVE  NEGATIVE Final  . Leukocytes, UA 08/09/2013 MODERATE* NEGATIVE Final  . Squamous Epithelial / LPF 08/09/2013 FEW* RARE  Final  . WBC, UA 08/09/2013 TOO NUMEROUS TO COUNT  <3 WBC/hpf Final  . RBC / HPF 08/09/2013 TOO NUMEROUS TO COUNT  <3 RBC/hpf Final  . Bacteria, UA 08/09/2013 MANY* RARE Final  . Specimen Description 08/09/2013 URINE, CATHETERIZED   Final  . Special Requests 08/09/2013 NONE   Final  . Culture  Setup Time 08/09/2013    Final                   Value:08/10/2013 01:08                         Performed at [facility]  . Colony Count 08/09/2013    Final                    Value:>=100,000 COLONIES/ML                         Performed at [facility]  . Culture 08/09/2013    Final                   Value:PSEUDOMONAS AERUGINOSA                         Performed at [facility]  . Report Status 08/09/2013 08/12/2013 FINAL   Final  . Organism ID, Bacteria 08/09/2013 PSEUDOMONAS AERUGINOSA   Final  . Glucose-Capillary 08/09/2013 113* 70 - 99 mg/dL Final  . Comment 1 08/09/2013 Notify RN   Final  . Sodium 08/10/2013 142  137 - 147 mEq/L Final  . Potassium 08/10/2013 3.9  3.7 - 5.3 mEq/L Final  . Chloride 08/10/2013 99  96 - 112 mEq/L Final  . CO2 08/10/2013 30  19 - 32 mEq/L Final  . Glucose, Bld 08/10/2013 98  70 - 99 mg/dL Final  . BUN 08/10/2013 68* 6 - 23 mg/dL Final  . Creatinine, Ser 08/10/2013 2.53* 0.50 - 1.10 mg/dL Final  . Calcium 08/10/2013 9.6  8.4 - 10.5 mg/dL Final  . GFR calc non Af Amer 08/10/2013 16* >90 mL/min Final  . GFR calc Af Amer 08/10/2013 19* >90 mL/min Final   Comment: (NOTE)                          The eGFR has been calculated using the CKD EPI equation.                          This calculation has not been validated in all clinical situations.                          eGFR's persistently <90 mL/min signify possible Chronic Kidney                          Disease.  . WBC 08/10/2013 4.3  4.0 - 10.5 K/uL Final  . RBC 08/10/2013 2.82* 3.87 - 5.11 MIL/uL Final  . Hemoglobin 08/10/2013 9.6* 12.0 - 15.0 g/dL Final  . HCT 08/10/2013 29.3* 36.0 - 46.0 % Final  . MCV 08/10/2013 103.9* 78.0 - 100.0 fL Final  . MCH 08/10/2013 34.0  26.0 - 34.0 pg Final  . MCHC 08/10/2013 32.8  30.0 - 36.0 g/dL Final  . RDW 08/10/2013 15.6* 11.5 - 15.5 % Final  .  Platelets 08/10/2013 176  150 - 400 K/uL Final  . Glucose-Capillary 08/09/2013 147* 70 - 99 mg/dL Final  . Comment 1 08/09/2013 Notify RN   Final  . Glucose-Capillary 08/09/2013 146* 70 - 99 mg/dL Final  . Comment 1 08/09/2013 Notify RN   Final  . Glucose-Capillary  08/10/2013 82  70 - 99 mg/dL Final  . Glucose-Capillary 08/10/2013 82  70 - 99 mg/dL Final  . Sodium 08/11/2013 143  137 - 147 mEq/L Final  . Potassium 08/11/2013 3.3* 3.7 - 5.3 mEq/L Final  . Chloride 08/11/2013 100  96 - 112 mEq/L Final  . CO2 08/11/2013 32  19 - 32 mEq/L Final  . Glucose, Bld 08/11/2013 121* 70 - 99 mg/dL Final  . BUN 08/11/2013 62* 6 - 23 mg/dL Final  . Creatinine, Ser 08/11/2013 2.32* 0.50 - 1.10 mg/dL Final  . Calcium 08/11/2013 9.0  8.4 - 10.5 mg/dL Final  . GFR calc non Af Amer 08/11/2013 18* >90 mL/min Final  . GFR calc Af Amer 08/11/2013 21* >90 mL/min Final   Comment: (NOTE)                          The eGFR has been calculated using the CKD EPI equation.                          This calculation has not been validated in all clinical situations.                          eGFR's persistently <90 mL/min signify possible Chronic Kidney                          Disease.  . Glucose-Capillary 08/10/2013 111* 70 - 99 mg/dL Final  . Glucose-Capillary 08/10/2013 182* 70 - 99 mg/dL Final  . Comment 1 08/10/2013 Notify RN   Final  . Glucose-Capillary 08/11/2013 104* 70 - 99 mg/dL Final  . Comment 1 08/11/2013 Notify RN   Final  . Glucose-Capillary 08/11/2013 93  70 - 99 mg/dL Final  . Glucose-Capillary 08/11/2013 135* 70 - 99 mg/dL Final  . Comment 1 08/11/2013 Notify RN   Final  . Glucose-Capillary 08/11/2013 138* 70 - 99 mg/dL Final  . Comment 1 08/11/2013 Notify RN   Final  . Glucose-Capillary 08/12/2013 139* 70 - 99 mg/dL Final  . Comment 1 08/12/2013 Notify RN   Final  . Glucose-Capillary 08/12/2013 145* 70 - 99 mg/dL Final  . Comment 1 08/12/2013 Notify RN   Final  . Comment 2 08/12/2013 Documented in Chart   Final  . Sodium 08/13/2013 142  137 - 147 mEq/L Final  . Potassium 08/13/2013 3.8  3.7 - 5.3 mEq/L Final  . Chloride 08/13/2013 98  96 - 112 mEq/L Final  . CO2 08/13/2013 31  19 - 32 mEq/L Final  . Glucose, Bld 08/13/2013 124* 70 - 99 mg/dL Final  .  BUN 08/13/2013 65* 6 - 23 mg/dL Final  . Creatinine, Ser 08/13/2013 2.06* 0.50 - 1.10 mg/dL Final  . Calcium 08/13/2013 8.9  8.4 - 10.5 mg/dL Final  . GFR calc non Af Amer 08/13/2013 21* >90 mL/min Final  . GFR calc Af Amer 08/13/2013 24* >90 mL/min Final   Comment: (NOTE)  The eGFR has been calculated using the CKD EPI equation.                          This calculation has not been validated in all clinical situations.                          eGFR's persistently <90 mL/min signify possible Chronic Kidney                          Disease.  . Glucose-Capillary 08/12/2013 143* 70 - 99 mg/dL Final  . Comment 1 08/12/2013 Notify RN   Final  . Glucose-Capillary 08/12/2013 185* 70 - 99 mg/dL Final  . Comment 1 08/12/2013 Documented in Chart   Final  . Comment 2 08/12/2013 Notify RN   Final  . Glucose-Capillary 08/13/2013 104* 70 - 99 mg/dL Final  . Glucose-Capillary 08/13/2013 119* 70 - 99 mg/dL Final  . Glucose-Capillary 08/13/2013 149* 70 - 99 mg/dL Final  . Glucose-Capillary 08/13/2013 194* 70 - 99 mg/dL Final  . Glucose-Capillary 08/14/2013 152* 70 - 99 mg/dL Final  . Glucose-Capillary 08/14/2013 112* 70 - 99 mg/dL Final    PATHOLOGY: No new pathology.  Urinalysis    Component Value Date/Time   COLORURINE YELLOW 08/09/2013 0850   APPEARANCEUR TURBID* 08/09/2013 0850   LABSPEC 1.015 08/09/2013 0850   PHURINE 5.0 08/09/2013 0850   GLUCOSEU NEGATIVE 08/09/2013 0850   HGBUR LARGE* 08/09/2013 0850   BILIRUBINUR NEGATIVE 08/09/2013 0850   KETONESUR NEGATIVE 08/09/2013 0850   PROTEINUR NEGATIVE 08/09/2013 0850   UROBILINOGEN 0.2 08/09/2013 0850   NITRITE NEGATIVE 08/09/2013 0850   LEUKOCYTESUR MODERATE* 08/09/2013 0850    RADIOGRAPHIC STUDIES: Dg Chest Portable 1 View  08/23/2013   CLINICAL DATA:  Rectal bleeding and CHF.  EXAM: PORTABLE CHEST - 1 VIEW  COMPARISON:  08/19/2013  FINDINGS: Mild elevation of the right hemidiaphragm is unchanged. Heart size is upper  limits of normal but unchanged. The thoracic aortic arch is heavily calcified. There are old right rib fractures. Mild haziness in both lungs have not significantly changed. No evidence for pulmonary edema. No focal airspace disease.  IMPRESSION: No acute chest abnormality.  Atherosclerotic disease.   Electronically Signed   By: Markus Daft M.D.   On: 08/23/2013 14:23   Dg Chest Portable 1 View  08/19/2013   CLINICAL DATA:  Abdominal swelling.  Leg swelling.  EXAM: PORTABLE CHEST - 1 VIEW  COMPARISON:  08/09/2013  FINDINGS: Enlarged cardiopericardial silhouette, similar to prior. Atherosclerotic aortic arch. Indistinct pulmonary vasculature. . Stable arthropathy in both glenohumeral joints. Old right rib fractures.  IMPRESSION: 1. Enlarged cardiopericardial silhouette with indistinct pulmonary vasculature favoring pulmonary venous hypertension. 2. Atherosclerosis.   Electronically Signed   By: Sherryl Barters M.D.   On: 08/19/2013 13:46    ASSESSMENT:  #1. Anemia of chronic disease in association with chronic renal failure, stable.  #2. MGUS, stable with no evidence of progression to lymphoma, myeloma, or amyloidosis, awaiting today's additional lab tests.  #3. Atrial fibrillation with controlled ventricular response, currently on Eliquis.  #4. Diabetes mellitus, type II, insulin requiring, controlled.  #5. Lower extremity skin cancer, no evidence of disease    PLAN:  #1. Aranesp 200 mcg subcutaneously. #2. Continue hospice care at the nursing home. #3. No further appointments in this clinic.   All questions were answered. The patient knows to call the  clinic with any problems, questions or concerns. We can certainly see the patient much sooner if necessary.   I spent 25 minutes counseling the patient face to face. The total time spent in the appointment was 30 minutes.    Farrel Gobble, MD 09/09/2013 12:12 PM  DISCLAIMER:  This note was dictated with voice recognition software.   Similar sounding words can inadvertently be transcribed inaccurately and may not be corrected upon review.

## 2013-09-09 NOTE — Progress Notes (Signed)
Catherine [name] presented for labwork. Labs per MD order drawn via Peripheral Line 23 gauge needle inserted in right AC  Good blood return present. Procedure without incident.  Needle removed intact. Patient tolerated procedure well.

## 2013-09-09 NOTE — Progress Notes (Signed)
Aranesp 200 mcg given right abdomen.  Prolia not given - is now with hospice.

## 2013-09-09 NOTE — Progress Notes (Signed)
Catherine [name] presented for labwork. Labs per MD order drawn via Peripheral Line 23 gauge needle inserted in right AC  Good blood return present. Procedure without incident.  Needle removed intact. Patient tolerated procedure well.  Catherine [name] presents today for injection per MD orders. Aranesp 200 mcg administered SQ in right Abdomen. Administration without incident. Patient tolerated well.

## 2013-09-09 NOTE — Patient Instructions (Signed)
[facility] Discharge Instructions  RECOMMENDATIONS MADE BY THE CONSULTANT AND ANY TEST RESULTS WILL BE SENT TO YOUR REFERRING PHYSICIAN.  EXAM FINDINGS BY THE PHYSICIAN TODAY AND SIGNS OR SYMPTOMS TO REPORT TO CLINIC OR PRIMARY PHYSICIAN: Exam and findings as discussed by Dr. Barnet Glasgow.  If you hemoglobin is less than 11 we will give you aranesp.  No further follow-up needed since you are with hospice now.    INSTRUCTIONS/FOLLOW-UP: None needed.  Thank you for choosing [facility] to provide your oncology and hematology care.  To afford each patient quality time with our providers, please arrive at least 15 minutes before your scheduled appointment time.  With your help, our goal is to use those 15 minutes to complete the necessary work-up to ensure our physicians have the information they need to help with your evaluation and healthcare recommendations.    Effective January 1st, 2014, we ask that you re-schedule your appointment with our physicians should you arrive 10 or more minutes late for your appointment.  We strive to give you quality time with our providers, and arriving late affects you and other patients whose appointments are after yours.    Again, thank you for choosing [facility].  Our hope is that these requests will decrease the amount of time that you wait before being seen by our physicians.       _____________________________________________________________  Should you have questions after your visit to [facility], please contact our office at [phone] between the hours of 8:30 a.m. and 5:00 p.m.  Voicemails left after 4:30 p.m. will not be returned until the following business day.  For prescription refill requests, have your pharmacy contact our office with your prescription refill request.

## 2013-09-09 NOTE — Addendum Note (Signed)
Addended by: Mellissa Kohut on: 09/09/2013 12:18 PM   Modules accepted: Orders

## 2013-09-11 LAB — KAPPA/LAMBDA LIGHT CHAINS
KAPPA FREE LGHT CHN: 10.1 mg/dL — AB (ref 0.33–1.94)
Kappa, lambda light chain ratio: 1.6 (ref 0.26–1.65)
Lambda free light chains: 6.32 mg/dL — ABNORMAL HIGH (ref 0.57–2.63)

## 2013-09-13 ENCOUNTER — Encounter: Payer: Medicare Other | Admitting: Adult Health

## 2013-09-13 LAB — MULTIPLE MYELOMA PANEL, SERUM
ALBUMIN ELP: 49 % — AB (ref 55.8–66.1)
ALPHA-1-GLOBULIN: 6.6 % — AB (ref 2.9–4.9)
ALPHA-2-GLOBULIN: 12.7 % — AB (ref 7.1–11.8)
Beta 2: 5.5 % (ref 3.2–6.5)
Beta Globulin: 6.2 % (ref 4.7–7.2)
Gamma Globulin: 20 % — ABNORMAL HIGH (ref 11.1–18.8)
IGG (IMMUNOGLOBIN G), SERUM: 1600 mg/dL (ref 690–1700)
IGM, SERUM: 75 mg/dL (ref 52–322)
IgA: 279 mg/dL (ref 69–380)
M-Spike, %: NOT DETECTED g/dL
TOTAL PROTEIN: 6.9 g/dL (ref 6.0–8.3)

## 2013-09-13 NOTE — Progress Notes (Deleted)
HPI: Catherine [name] is an 80 year old female patient of Dr. Roderic [facility]au [facility] and our practice with multiple admissions for acute on chronic diastolic heart failure, end-stage, with chronic atrial fibrillation, chronic kidney disease stage III, pulmonary hypertension. The patient has been admitted to the hospital 3 times within the last 2 months for decompensation of CHF. She resides at [facility] assisted living. Most recent discharge was on 08/23/2013. The patient has inoperable mitral regurg and tricuspid regurg, is not an anticoagulation candidate due to frequent GI bleeds. She has epidermal lysis bullosa, with chronic lower extremity edema. She is here for hospital followup. Hospice was discussed on last hospital admission. Most recent weight recorded with 161 pounds at the end of her most recent hospitalization.   Allergies  Allergen Reactions  . Amoxicillin-Pot Clavulanate Diarrhea    Current Outpatient Prescriptions  Medication Sig Dispense Refill  . acetaminophen (TYLENOL) 325 MG tablet Take 650 mg by mouth every 6 (six) hours as needed for mild pain, fever or headache.       . albuterol (PROVENTIL HFA;VENTOLIN HFA) 108 (90 BASE) MCG/ACT inhaler Inhale 2 puffs into the lungs every 4 (four) hours as needed for wheezing or shortness of breath.      . citalopram (CELEXA) 20 MG tablet Take 20 mg by mouth daily.      . clonazePAM (KLONOPIN) 0.5 MG tablet Take 0.5 mg by mouth 3 (three) times daily as needed for anxiety.      . cyanocobalamin (,VITAMIN B-12,) 1000 MCG/ML injection Inject 1,000 mcg into the muscle every 30 (thirty) days.      Marland Kitchen diltiazem (CARDIZEM) 60 MG tablet Take 1 tablet (60 mg total) by mouth every 12 (twelve) hours.  60 tablet  12  . ferrous sulfate 325 (65 FE) MG tablet Take 325 mg by mouth daily with breakfast.      . fluocinonide cream (LIDEX) 8.46 % Apply 1 application topically daily. Apply sparingly to itchy areas on trunk & extremeties      . insulin glargine  (LANTUS) 100 UNIT/ML injection Inject 0.06 mLs (6 Units total) into the skin at bedtime.  10 mL  11  . metoprolol tartrate (LOPRESSOR) 25 MG tablet Take 1 tablet (25 mg total) by mouth 2 (two) times daily.  60 tablet  12  . morphine (ROXANOL) 20 MG/ML concentrated solution Take by mouth every 2 (two) hours as needed for shortness of breath.      . pantoprazole (PROTONIX) 40 MG tablet Take 40 mg by mouth daily.      . polyethylene glycol (MIRALAX / GLYCOLAX) packet Take 17 g by mouth daily as needed for mild constipation.      . potassium chloride (K-DUR,KLOR-CON) 10 MEQ tablet Take 1 tablet (10 mEq total) by mouth 2 (two) times daily.  60 tablet  12  . torsemide (DEMADEX) 20 MG tablet Take 2 tablets (40 mg total) by mouth 2 (two) times daily.  120 tablet  12  . triamcinolone cream (KENALOG) 0.1 % Apply 1 application topically daily. Apply to lower extremities.       No current facility-administered medications for this visit.    Past Medical History  Diagnosis Date  . Diastolic heart failure     LVEF 60%  . Type 2 diabetes mellitus   . Essential hypertension, benign   . COPD (chronic obstructive pulmonary disease)     Home O2  . Vertigo   . Breast cancer   . Stroke  Patient denies  . Skin disorder     Epidermolysis bullosa.  . Pulmonary hypertension   . Multiple thyroid nodules   . Anemia   . Aortic stenosis   . Monoclonal gammopathy   . Diverticulosis   . Squamous cell carcinoma   . Osteoporosis   . CKD (chronic kidney disease) stage 4, GFR 15-29 ml/min   . Atrial fibrillation   . Mitral regurgitation     Moderate with severe left atrial enlargement    Past Surgical History  Procedure Laterality Date  . Cholecystectomy    . Hemorrhoid surgery    . Abdominal hysterectomy  1991    Complete hysterectomy for cancer  . Mastectomy  2005    Left breast, tamoxifin  . Goiter    . Hip fracture surgery  2010    Right hip replacement  . Hip fracture surgery  2006    Left  hip replacement  . Colonoscopy  ?  Marland Kitchen Cataract extraction, bilateral      ROS:  *** PHYSICAL EXAM There were no vitals taken for this visit. ***  EKG:  ***  ASSESSMENT AND PLAN

## 2013-09-14 NOTE — Progress Notes (Signed)
   Patient ID: Catherine [name], female    DOB: 27-Mar-1924, 80 y.o.   MRN: [id number]  HPI    Review of Systems    Physical Exam     Error-No Show

## 2013-09-24 NOTE — Progress Notes (Signed)
This encounter was created in error - please disregard.

## 2013-10-13 ENCOUNTER — Other Ambulatory Visit: Payer: Self-pay

## 2013-10-13 ENCOUNTER — Emergency Department (HOSPITAL_COMMUNITY)

## 2013-10-13 ENCOUNTER — Encounter (HOSPITAL_COMMUNITY): Payer: Self-pay | Admitting: Emergency Medicine

## 2013-10-13 ENCOUNTER — Inpatient Hospital Stay (HOSPITAL_COMMUNITY)
Admission: EM | Admit: 2013-10-13 | Discharge: 2013-10-21 | DRG: 291 | Disposition: A | Discharge status: Nursing Home (Includes ICF) | Attending: Internal Medicine | Admitting: Internal Medicine

## 2013-10-13 DIAGNOSIS — J189 Pneumonia, unspecified organism: Secondary | ICD-10-CM | POA: Diagnosis present

## 2013-10-13 DIAGNOSIS — N185 Chronic kidney disease, stage 5: Secondary | ICD-10-CM | POA: Diagnosis present

## 2013-10-13 DIAGNOSIS — I5033 Acute on chronic diastolic (congestive) heart failure: Principal | ICD-10-CM | POA: Diagnosis present

## 2013-10-13 DIAGNOSIS — Z833 Family history of diabetes mellitus: Secondary | ICD-10-CM

## 2013-10-13 DIAGNOSIS — J9610 Chronic respiratory failure, unspecified whether with hypoxia or hypercapnia: Secondary | ICD-10-CM | POA: Diagnosis present

## 2013-10-13 DIAGNOSIS — E876 Hypokalemia: Secondary | ICD-10-CM | POA: Diagnosis present

## 2013-10-13 DIAGNOSIS — I10 Essential (primary) hypertension: Secondary | ICD-10-CM | POA: Diagnosis present

## 2013-10-13 DIAGNOSIS — R0602 Shortness of breath: Secondary | ICD-10-CM | POA: Diagnosis not present

## 2013-10-13 DIAGNOSIS — E119 Type 2 diabetes mellitus without complications: Secondary | ICD-10-CM | POA: Diagnosis present

## 2013-10-13 DIAGNOSIS — D638 Anemia in other chronic diseases classified elsewhere: Secondary | ICD-10-CM | POA: Diagnosis present

## 2013-10-13 DIAGNOSIS — I509 Heart failure, unspecified: Secondary | ICD-10-CM | POA: Diagnosis present

## 2013-10-13 DIAGNOSIS — K5900 Constipation, unspecified: Secondary | ICD-10-CM | POA: Diagnosis present

## 2013-10-13 DIAGNOSIS — I4891 Unspecified atrial fibrillation: Secondary | ICD-10-CM | POA: Diagnosis present

## 2013-10-13 DIAGNOSIS — A4902 Methicillin resistant Staphylococcus aureus infection, unspecified site: Secondary | ICD-10-CM | POA: Diagnosis present

## 2013-10-13 DIAGNOSIS — Z794 Long term (current) use of insulin: Secondary | ICD-10-CM

## 2013-10-13 DIAGNOSIS — J4489 Other specified chronic obstructive pulmonary disease: Secondary | ICD-10-CM | POA: Diagnosis present

## 2013-10-13 DIAGNOSIS — I120 Hypertensive chronic kidney disease with stage 5 chronic kidney disease or end stage renal disease: Secondary | ICD-10-CM | POA: Diagnosis present

## 2013-10-13 DIAGNOSIS — I2789 Other specified pulmonary heart diseases: Secondary | ICD-10-CM | POA: Diagnosis present

## 2013-10-13 DIAGNOSIS — N184 Chronic kidney disease, stage 4 (severe): Secondary | ICD-10-CM

## 2013-10-13 DIAGNOSIS — N390 Urinary tract infection, site not specified: Secondary | ICD-10-CM | POA: Diagnosis present

## 2013-10-13 DIAGNOSIS — M810 Age-related osteoporosis without current pathological fracture: Secondary | ICD-10-CM | POA: Diagnosis present

## 2013-10-13 DIAGNOSIS — Z9010 Acquired absence of unspecified breast and nipple: Secondary | ICD-10-CM

## 2013-10-13 DIAGNOSIS — Z9981 Dependence on supplemental oxygen: Secondary | ICD-10-CM

## 2013-10-13 DIAGNOSIS — Z8249 Family history of ischemic heart disease and other diseases of the circulatory system: Secondary | ICD-10-CM

## 2013-10-13 DIAGNOSIS — Z66 Do not resuscitate: Secondary | ICD-10-CM | POA: Diagnosis present

## 2013-10-13 DIAGNOSIS — Z853 Personal history of malignant neoplasm of breast: Secondary | ICD-10-CM

## 2013-10-13 DIAGNOSIS — Z96649 Presence of unspecified artificial hip joint: Secondary | ICD-10-CM

## 2013-10-13 DIAGNOSIS — I959 Hypotension, unspecified: Secondary | ICD-10-CM | POA: Diagnosis present

## 2013-10-13 DIAGNOSIS — Z79899 Other long term (current) drug therapy: Secondary | ICD-10-CM

## 2013-10-13 DIAGNOSIS — J449 Chronic obstructive pulmonary disease, unspecified: Secondary | ICD-10-CM | POA: Diagnosis present

## 2013-10-13 LAB — URINALYSIS, ROUTINE W REFLEX MICROSCOPIC
BILIRUBIN URINE: NEGATIVE
Glucose, UA: NEGATIVE mg/dL
KETONES UR: NEGATIVE mg/dL
Nitrite: NEGATIVE
PROTEIN: 100 mg/dL — AB
Specific Gravity, Urine: 1.02 (ref 1.005–1.030)
UROBILINOGEN UA: 0.2 mg/dL (ref 0.0–1.0)
pH: 6.5 (ref 5.0–8.0)

## 2013-10-13 LAB — CBC WITH DIFFERENTIAL/PLATELET
Basophils Absolute: 0 10*3/uL (ref 0.0–0.1)
Basophils Relative: 1 % (ref 0–1)
Eosinophils Absolute: 0.2 10*3/uL (ref 0.0–0.7)
Eosinophils Relative: 4 % (ref 0–5)
HEMATOCRIT: 33.1 % — AB (ref 36.0–46.0)
Hemoglobin: 10.7 g/dL — ABNORMAL LOW (ref 12.0–15.0)
LYMPHS ABS: 0.3 10*3/uL — AB (ref 0.7–4.0)
LYMPHS PCT: 6 % — AB (ref 12–46)
MCH: 32.2 pg (ref 26.0–34.0)
MCHC: 32.3 g/dL (ref 30.0–36.0)
MCV: 99.7 fL (ref 78.0–100.0)
MONO ABS: 0.6 10*3/uL (ref 0.1–1.0)
Monocytes Relative: 12 % (ref 3–12)
NEUTROS ABS: 3.9 10*3/uL (ref 1.7–7.7)
Neutrophils Relative %: 77 % (ref 43–77)
PLATELETS: 190 10*3/uL (ref 150–400)
RBC: 3.32 MIL/uL — ABNORMAL LOW (ref 3.87–5.11)
RDW: 14.7 % (ref 11.5–15.5)
WBC: 5 10*3/uL (ref 4.0–10.5)

## 2013-10-13 LAB — COMPREHENSIVE METABOLIC PANEL
AST: 10 U/L (ref 0–37)
Albumin: 3.1 g/dL — ABNORMAL LOW (ref 3.5–5.2)
Alkaline Phosphatase: 86 U/L (ref 39–117)
BILIRUBIN TOTAL: 0.4 mg/dL (ref 0.3–1.2)
BUN: 67 mg/dL — ABNORMAL HIGH (ref 6–23)
CHLORIDE: 99 meq/L (ref 96–112)
CO2: 26 meq/L (ref 19–32)
Calcium: 8.9 mg/dL (ref 8.4–10.5)
Creatinine, Ser: 3.55 mg/dL — ABNORMAL HIGH (ref 0.50–1.10)
GFR calc Af Amer: 12 mL/min — ABNORMAL LOW (ref >90)
GFR, EST NON AFRICAN AMERICAN: 11 mL/min — AB (ref >90)
Glucose, Bld: 174 mg/dL — ABNORMAL HIGH (ref 70–99)
Potassium: 4.3 mEq/L (ref 3.7–5.3)
SODIUM: 140 meq/L (ref 137–147)
Total Protein: 7.2 g/dL (ref 6.0–8.3)

## 2013-10-13 LAB — URINE MICROSCOPIC-ADD ON

## 2013-10-13 LAB — TROPONIN I: Troponin I: <0.3 ng/mL (ref <0.30)

## 2013-10-13 LAB — I-STAT CG4 LACTIC ACID, ED: LACTIC ACID, VENOUS: 1.07 mmol/L (ref 0.5–2.2)

## 2013-10-13 LAB — PRO B NATRIURETIC PEPTIDE: PRO B NATRI PEPTIDE: 21091 pg/mL — AB (ref 0–450)

## 2013-10-13 LAB — GLUCOSE, CAPILLARY
GLUCOSE-CAPILLARY: 133 mg/dL — AB (ref 70–99)
Glucose-Capillary: 135 mg/dL — ABNORMAL HIGH (ref 70–99)

## 2013-10-13 MED ORDER — FERROUS SULFATE 325 (65 FE) MG PO TABS
325.0000 mg | ORAL_TABLET | Freq: Every day | ORAL | Status: DC
  Administered 2013-10-14 – 2013-10-21 (×8): 325 mg via ORAL
  Filled 2013-10-13 (×8): qty 1

## 2013-10-13 MED ORDER — DEXTROSE 5 % IV SOLN
INTRAVENOUS | Status: AC
  Filled 2013-10-13: qty 1

## 2013-10-13 MED ORDER — VANCOMYCIN HCL IN DEXTROSE 1-5 GM/200ML-% IV SOLN
1000.0000 mg | INTRAVENOUS | Status: DC
  Administered 2013-10-13 – 2013-10-17 (×3): 1000 mg via INTRAVENOUS
  Filled 2013-10-13 (×3): qty 200

## 2013-10-13 MED ORDER — VANCOMYCIN HCL IN DEXTROSE 1-5 GM/200ML-% IV SOLN
INTRAVENOUS | Status: AC
  Filled 2013-10-13: qty 200

## 2013-10-13 MED ORDER — POLYETHYLENE GLYCOL 3350 17 G PO PACK: 17.0000 g | PACK | Freq: Every day | ORAL | Status: DC | PRN

## 2013-10-13 MED ORDER — FLUOCINONIDE 0.05 % EX CREA
1.0000 "application " | TOPICAL_CREAM | Freq: Every day | CUTANEOUS | Status: DC
  Administered 2013-10-14 – 2013-10-21 (×8): 1 via TOPICAL
  Filled 2013-10-13: qty 30

## 2013-10-13 MED ORDER — MORPHINE SULFATE 10 MG/5ML PO SOLN: 10.0000 mg | ORAL | Status: DC | PRN

## 2013-10-13 MED ORDER — ACETAMINOPHEN 325 MG PO TABS
650.0000 mg | ORAL_TABLET | Freq: Four times a day (QID) | ORAL | Status: DC | PRN
  Administered 2013-10-15 – 2013-10-20 (×6): 650 mg via ORAL
  Filled 2013-10-13 (×6): qty 2

## 2013-10-13 MED ORDER — FUROSEMIDE 10 MG/ML IJ SOLN
60.0000 mg | Freq: Two times a day (BID) | INTRAMUSCULAR | Status: DC
  Administered 2013-10-13 – 2013-10-16 (×6): 60 mg via INTRAVENOUS
  Filled 2013-10-13 (×6): qty 6

## 2013-10-13 MED ORDER — ACETAMINOPHEN 650 MG RE SUPP: 650.0000 mg | Freq: Four times a day (QID) | RECTAL | Status: DC | PRN

## 2013-10-13 MED ORDER — SODIUM CHLORIDE 0.9 % IJ SOLN
3.0000 mL | Freq: Two times a day (BID) | INTRAMUSCULAR | Status: DC
  Administered 2013-10-14 – 2013-10-21 (×7): 3 mL via INTRAVENOUS

## 2013-10-13 MED ORDER — MORPHINE SULFATE (CONCENTRATE) 20 MG/ML PO SOLN: 10.0000 mg | ORAL | Status: DC | PRN

## 2013-10-13 MED ORDER — INSULIN GLARGINE 100 UNIT/ML ~~LOC~~ SOLN
6.0000 [IU] | Freq: Every day | SUBCUTANEOUS | Status: DC
  Administered 2013-10-13 – 2013-10-20 (×8): 6 [IU] via SUBCUTANEOUS
  Filled 2013-10-13 (×10): qty 0.06

## 2013-10-13 MED ORDER — TRIAMCINOLONE ACETONIDE 0.1 % EX CREA
TOPICAL_CREAM | CUTANEOUS | Status: AC
  Filled 2013-10-13: qty 15

## 2013-10-13 MED ORDER — ENOXAPARIN SODIUM 30 MG/0.3ML ~~LOC~~ SOLN
30.0000 mg | SUBCUTANEOUS | Status: DC
  Administered 2013-10-13 – 2013-10-20 (×8): 30 mg via SUBCUTANEOUS
  Filled 2013-10-13 (×8): qty 0.3

## 2013-10-13 MED ORDER — POTASSIUM CHLORIDE CRYS ER 10 MEQ PO TBCR
10.0000 meq | EXTENDED_RELEASE_TABLET | Freq: Two times a day (BID) | ORAL | Status: DC
  Administered 2013-10-13 – 2013-10-17 (×9): 10 meq via ORAL
  Filled 2013-10-13 (×9): qty 1

## 2013-10-13 MED ORDER — SODIUM CHLORIDE 0.9 % IV SOLN: 250.0000 mL | INTRAVENOUS | Status: DC | PRN

## 2013-10-13 MED ORDER — ONDANSETRON HCL 4 MG/2ML IJ SOLN: 4.0000 mg | Freq: Four times a day (QID) | INTRAMUSCULAR | Status: DC | PRN

## 2013-10-13 MED ORDER — TRIAMCINOLONE ACETONIDE 0.1 % EX CREA
1.0000 "application " | TOPICAL_CREAM | Freq: Every day | CUTANEOUS | Status: DC
  Administered 2013-10-14 – 2013-10-21 (×8): 1 via TOPICAL
  Filled 2013-10-13: qty 15

## 2013-10-13 MED ORDER — SODIUM CHLORIDE 0.9 % IJ SOLN
3.0000 mL | Freq: Two times a day (BID) | INTRAMUSCULAR | Status: DC
  Administered 2013-10-13 – 2013-10-15 (×3): 3 mL via INTRAVENOUS

## 2013-10-13 MED ORDER — METOPROLOL TARTRATE 25 MG PO TABS
25.0000 mg | ORAL_TABLET | Freq: Two times a day (BID) | ORAL | Status: DC
  Administered 2013-10-13 – 2013-10-21 (×16): 25 mg via ORAL
  Filled 2013-10-13 (×16): qty 1

## 2013-10-13 MED ORDER — SODIUM CHLORIDE 0.9 % IV SOLN
INTRAVENOUS | Status: DC
  Administered 2013-10-13: 11:00:00 via INTRAVENOUS

## 2013-10-13 MED ORDER — CLONAZEPAM 0.5 MG PO TABS
0.5000 mg | ORAL_TABLET | Freq: Three times a day (TID) | ORAL | Status: DC | PRN
  Administered 2013-10-15 – 2013-10-21 (×6): 0.5 mg via ORAL
  Filled 2013-10-13 (×6): qty 1

## 2013-10-13 MED ORDER — SODIUM CHLORIDE 0.9 % IJ SOLN
3.0000 mL | INTRAMUSCULAR | Status: DC | PRN
  Administered 2013-10-18: 3 mL via INTRAVENOUS

## 2013-10-13 MED ORDER — PANTOPRAZOLE SODIUM 40 MG PO TBEC
40.0000 mg | DELAYED_RELEASE_TABLET | Freq: Every day | ORAL | Status: DC
  Administered 2013-10-14 – 2013-10-21 (×8): 40 mg via ORAL
  Filled 2013-10-13 (×8): qty 1

## 2013-10-13 MED ORDER — ONDANSETRON HCL 4 MG PO TABS: 4.0000 mg | ORAL_TABLET | Freq: Four times a day (QID) | ORAL | Status: DC | PRN

## 2013-10-13 MED ORDER — CITALOPRAM HYDROBROMIDE 20 MG PO TABS
20.0000 mg | ORAL_TABLET | Freq: Every day | ORAL | Status: DC
  Administered 2013-10-14 – 2013-10-21 (×8): 20 mg via ORAL
  Filled 2013-10-13 (×8): qty 1

## 2013-10-13 MED ORDER — DEXTROSE 5 % IV SOLN
1.0000 g | INTRAVENOUS | Status: DC
  Administered 2013-10-13 – 2013-10-20 (×8): 1 g via INTRAVENOUS
  Filled 2013-10-13 (×9): qty 1

## 2013-10-13 MED ORDER — DILTIAZEM HCL 60 MG PO TABS
60.0000 mg | ORAL_TABLET | Freq: Two times a day (BID) | ORAL | Status: DC
  Administered 2013-10-13 – 2013-10-21 (×16): 60 mg via ORAL
  Filled 2013-10-13 (×16): qty 1

## 2013-10-13 NOTE — ED Notes (Signed)
Pt has indwelling foley catheter started at [facility], due to change next week per hospice nurse, 8F foley in place and draining. Report given to [facility]

## 2013-10-13 NOTE — Progress Notes (Signed)
ANTIBIOTIC CONSULT NOTE - INITIAL  Pharmacy Consult for Vancomycin & Cefepime Indication: pneumonia  Allergies  Allergen Reactions  . Amoxicillin-Pot Clavulanate Diarrhea    Patient Measurements: Height: 5\' 3"  (160 cm) Weight: 169 lb 12.1 oz (77 kg) IBW/kg (Calculated) : 52.4  Vital Signs: Temp: 97.3 F (36.3 C) (06/17 1459) Temp src: Oral (06/17 1459) BP: 102/65 mmHg (06/17 1459) Pulse Rate: 72 (06/17 1459) Intake/Output from previous day:   Intake/Output from this shift:    Labs:  Recent Labs  10/13/13 1101  WBC 5.0  HGB 10.7*  PLT 190  CREATININE 3.55*   Estimated Creatinine Clearance: 11 ml/min (by C-G formula based on Cr of 3.55). No results found for this basename: VANCOTROUGH, VANCOPEAK, VANCORANDOM, GENTTROUGH, GENTPEAK, GENTRANDOM, TOBRATROUGH, TOBRAPEAK, TOBRARND, AMIKACINPEAK, AMIKACINTROU, AMIKACIN,  in the last 72 hours   Microbiology: No results found for this or any previous visit (from the past 720 hour(s)).  Medical History: Past Medical History  Diagnosis Date  . Diastolic heart failure     LVEF 60%  . Type 2 diabetes mellitus   . Essential hypertension, benign   . COPD (chronic obstructive pulmonary disease)     Home O2  . Vertigo   . Breast cancer   . Stroke      Patient denies  . Skin disorder     Epidermolysis bullosa.  . Pulmonary hypertension   . Multiple thyroid nodules   . Anemia   . Aortic stenosis   . Monoclonal gammopathy   . Diverticulosis   . Squamous cell carcinoma   . Osteoporosis   . CKD (chronic kidney disease) stage 4, GFR 15-29 ml/min   . Atrial fibrillation   . Mitral regurgitation     Moderate with severe left atrial enlargement    Medications:  Scheduled:  . ceFEPime (MAXIPIME) IV  1 g Intravenous Q24H  . citalopram  20 mg Oral Daily  . diltiazem  60 mg Oral Q12H  . enoxaparin (LOVENOX) injection  30 mg Subcutaneous Q24H  . ferrous sulfate  325 mg Oral Q breakfast  . fluocinonide cream  1 application  Topical Daily  . furosemide  60 mg Intravenous Q12H  . insulin glargine  6 Units Subcutaneous QHS  . metoprolol tartrate  25 mg Oral BID  . pantoprazole  40 mg Oral Daily  . potassium chloride  10 mEq Oral BID  . sodium chloride  3 mL Intravenous Q12H  . sodium chloride  3 mL Intravenous Q12H  . triamcinolone cream  1 application Topical Daily  . vancomycin  1,000 mg Intravenous Q48H   Assessment: 80 yo F with end stage HF & CKD who is being followed by Hospice presented to ED today from MD office for hypotension & SOB.  CXR + for developing PNA.  UA +infection.  She lives in a nursing facility & is therefore being started on empiric, broad-spectrum antibiotics.  She is afebrile with normal WBC.  She has Stage IV-V CKD.  Estimated CrCl ~10-36ml/min which is patient's baseline.   Vancomycin 6/17>> Cefepime 6/17>>   Goal of Therapy:  Vancomycin trough level 15-20 mcg/ml  Plan:  Cefepime 1gm IV q24h Vancomycin 1gm IV q48h Check Vancomycin trough at steady state Monitor renal function and cx data   Biagio Borg 10/13/2013,7:17 PM

## 2013-10-13 NOTE — ED Notes (Signed)
Hospice nurse at bedside to speak with client.

## 2013-10-13 NOTE — ED Notes (Signed)
Sent from Dr. Silvano Rusk office for hypotension and fluid retention.  Pt denies pain, c/o sob.

## 2013-10-13 NOTE — ED Provider Notes (Signed)
CSN: [id number]     Arrival date & time 10/13/13  1041 History  This chart was scribed for Leota Jacobsen, MD by Girtha Hake, ED Scribe. The patient was seen in [place]. The patient's care was started at 10:50 AM.   Chief Complaint  Patient presents with  . Hypotension    The history is provided by the patient. No language interpreter was used.   HPI Comments: Catherine [name] is a 80 y.o. female who presents to the Emergency Department complaining of hypotension. Patient was at her follow up with Dr. Michela Pitcher (urology) who noted that her blood pressure was 80s/50s upon arrival in his office. Patient has a history of COPD and is on continuous oxygen (2 L/min), but her caregiver states that her breathing is no worse than usual today. She also has a history of chronic edema, and Dr. Michela Pitcher was also concerned about fluid retention problems today. Caregiver reports that patient's leg swelling does not seem worse than usual. Patient denies cough, CP, fever, vomiting. Patient lives at [facility]. Her medical history also includes DM, breast cancer, aortic stenosis, renal disease with CKD 4, and atrial fibrillation.  Past Medical History  Diagnosis Date  . Diastolic heart failure     LVEF 60%  . Type 2 diabetes mellitus   . Essential hypertension, benign   . COPD (chronic obstructive pulmonary disease)     Home O2  . Vertigo   . Breast cancer   . Stroke      Patient denies  . Skin disorder     Epidermolysis bullosa.  . Pulmonary hypertension   . Multiple thyroid nodules   . Anemia   . Aortic stenosis   . Monoclonal gammopathy   . Diverticulosis   . Squamous cell carcinoma   . Osteoporosis   . CKD (chronic kidney disease) stage 4, GFR 15-29 ml/min   . Atrial fibrillation   . Mitral regurgitation     Moderate with severe left atrial enlargement   Past Surgical History  Procedure Laterality Date  . Cholecystectomy    . Hemorrhoid surgery    . Abdominal  hysterectomy  1991    Complete hysterectomy for cancer  . Mastectomy  2005    Left breast, tamoxifin  . Goiter    . Hip fracture surgery  2010    Right hip replacement  . Hip fracture surgery  2006    Left hip replacement  . Colonoscopy  ?  Marland Kitchen Cataract extraction, bilateral     Family History  Problem Relation Age of Onset  . Colon cancer Neg Hx   . Heart attack Son     Age 2s, suspected MI  . Diabetes Mother    History  Substance Use Topics  . Smoking status: Never Smoker   . Smokeless tobacco: Never Used  . Alcohol Use: No   OB History   Grav Para Term Preterm Abortions TAB SAB Ect Mult Living                 Review of Systems  Constitutional: Negative for fever and chills.  Respiratory: Positive for shortness of breath. Negative for cough.   Cardiovascular: Negative for chest pain.  Gastrointestinal: Negative for nausea and vomiting.  All other systems reviewed and are negative.    Allergies  Amoxicillin-pot clavulanate  Home Medications   Prior to Admission medications   Medication Sig Start Date End Date Taking? Authorizing Provider  acetaminophen (TYLENOL) 325 MG tablet  Take 650 mg by mouth every 6 (six) hours as needed for mild pain, fever or headache.     Historical Provider, MD  albuterol (PROVENTIL HFA;VENTOLIN HFA) 108 (90 BASE) MCG/ACT inhaler Inhale 2 puffs into the lungs every 4 (four) hours as needed for wheezing or shortness of breath.    Historical Provider, MD  citalopram (CELEXA) 20 MG tablet Take 20 mg by mouth daily.    Historical Provider, MD  clonazePAM (KLONOPIN) 0.5 MG tablet Take 0.5 mg by mouth 3 (three) times daily as needed for anxiety.    Historical Provider, MD  cyanocobalamin (,VITAMIN B-12,) 1000 MCG/ML injection Inject 1,000 mcg into the muscle every 30 (thirty) days.    Historical Provider, MD  diltiazem (CARDIZEM) 60 MG tablet Take 1 tablet (60 mg total) by mouth every 12 (twelve) hours. 08/30/13   Asencion Noble, MD  ferrous sulfate  325 (65 FE) MG tablet Take 325 mg by mouth daily with breakfast.    Historical Provider, MD  fluocinonide cream (LIDEX) 4.13 % Apply 1 application topically daily. Apply sparingly to itchy areas on trunk & extremeties    Historical Provider, MD  insulin glargine (LANTUS) 100 UNIT/ML injection Inject 0.06 mLs (6 Units total) into the skin at bedtime. 08/14/13   Asencion Noble, MD  metoprolol tartrate (LOPRESSOR) 25 MG tablet Take 1 tablet (25 mg total) by mouth 2 (two) times daily. 08/30/13   Asencion Noble, MD  morphine (ROXANOL) 20 MG/ML concentrated solution Take by mouth every 2 (two) hours as needed for shortness of breath.    Historical Provider, MD  pantoprazole (PROTONIX) 40 MG tablet Take 40 mg by mouth daily.    Historical Provider, MD  polyethylene glycol (MIRALAX / GLYCOLAX) packet Take 17 g by mouth daily as needed for mild constipation.    Historical Provider, MD  potassium chloride (K-DUR,KLOR-CON) 10 MEQ tablet Take 1 tablet (10 mEq total) by mouth 2 (two) times daily. 08/30/13   Asencion Noble, MD  torsemide (DEMADEX) 20 MG tablet Take 2 tablets (40 mg total) by mouth 2 (two) times daily. 08/30/13   Asencion Noble, MD  triamcinolone cream (KENALOG) 0.1 % Apply 1 application topically daily. Apply to lower extremities.    Historical Provider, MD   Triage Vitals: BP 95/58  Pulse 62  Resp 14  Ht 5\' 3"  (1.6 m)  Wt 170 lb (77.111 kg)  BMI 30.12 kg/m2  SpO2 89% Physical Exam  Nursing note and vitals reviewed. Constitutional: She is oriented to person, place, and time. She appears well-developed and well-nourished.  Non-toxic appearance. No distress.  HENT:  Head: Normocephalic and atraumatic.  Eyes: Conjunctivae, EOM and lids are normal. Pupils are equal, round, and reactive to light.  Neck: Normal range of motion. Neck supple. No tracheal deviation present. No mass present.  Cardiovascular: Normal rate, regular rhythm and normal heart sounds.  Exam reveals no gallop.   No murmur heard. Pulmonary/Chest:  Effort normal. No stridor. No respiratory distress. She has no decreased breath sounds. She has no wheezes. She has rhonchi. She has no rales.  Scattered rhonchi bilaterally.  Bandages on her anterior chest wall. Multiple healing wounds on her chest wall without evidence of infedction.  Abdominal: Soft. Normal appearance and bowel sounds are normal. She exhibits no distension. There is no tenderness. There is no rebound and no CVA tenderness.  Musculoskeletal: Normal range of motion. She exhibits edema. She exhibits no tenderness.  2+ edema bilaterally in lower extremities.  Neurological: She is alert and  oriented to person, place, and time. She has normal strength. No cranial nerve deficit or sensory deficit. GCS eye subscore is 4. GCS verbal subscore is 5. GCS motor subscore is 6.  Skin: Skin is warm and dry. No abrasion and no rash noted.  Psychiatric: She has a normal mood and affect. Her speech is normal and behavior is normal.    ED Course  Procedures (including critical care time)  COORDINATION OF CARE: 10:55 AM- Oxygen saturation was in the low 80s upon arrival in the ED while patient was on 2L/min per nursing staff.  Patient was increased to 3L/min and O2 saturation is now 89%. Discussed treatment plan which includes IV fluids, CXR, and labs with pt at bedside and pt agreed to plan.     Labs Review Labs Reviewed - No data to display  Imaging Review No results found.   EKG Interpretation None      MDM   Final diagnoses:  None     Date: 10/13/2013  Rate: 70  Rhythm: atrial fibrillation  QRS Axis: normal  Intervals: normal  ST/T Wave abnormalities: nonspecific ST changes  Conduction Disutrbances:right bundle branch block  Narrative Interpretation:   Old EKG Reviewed: none available  Patient is on hospice currently. Will be admitted by medicine. He will start the patient's therapy     Leota Jacobsen, MD 10/13/13 1236

## 2013-10-13 NOTE — ED Notes (Signed)
Attempted to call report x2, nurse at lunch, charge nurse unavailable, will try back.

## 2013-10-13 NOTE — H&P (Signed)
[facility] History and Physical  Sherron Mapp Hunton RSW:[id number] DOB: 04-16-1924 DOA: 10/13/2013  Referring physician: Dr. Zenia Resides, ER physician PCP: Asencion Noble, MD   Chief Complaint: Shortness of breath  HPI: Catherine [name] is a 80 y.o. female with multiple medical problems including diastolic congestive heart failure, chronic kidney disease stage IV to 5, poor long-term prognosis followed by hospice. Patient was apparently sent to her urologist today to followup on Foley catheter placement. It was noted that she was somewhat hypotensive and appeared to be short of breath. She was sent to the ER for evaluation. The patient does not feel that her breathing is any worse than her baseline. She feels that her lower extremity edema has progressively gotten worse and her legs feel "tight". She's not had any significant cough or fever. No vomiting or diarrhea. Chest x-ray in the emergency room indicates a possible developing pneumonia. Urinalysis indicates possible infection. I discussed her advanced directives and will hospice in her care. She understands that the hospice objective is to make her comfortable. She'll be admitted to the hospital for supportive treatment.   Review of Systems:  Pertinent positives as per history of present illness, otherwise negative Past Medical History  Diagnosis Date  . Diastolic heart failure     LVEF 60%  . Type 2 diabetes mellitus   . Essential hypertension, benign   . COPD (chronic obstructive pulmonary disease)     Home O2  . Vertigo   . Breast cancer   . Stroke      Patient denies  . Skin disorder     Epidermolysis bullosa.  . Pulmonary hypertension   . Multiple thyroid nodules   . Anemia   . Aortic stenosis   . Monoclonal gammopathy   . Diverticulosis   . Squamous cell carcinoma   . Osteoporosis   . CKD (chronic kidney disease) stage 4, GFR 15-29 ml/min   . Atrial fibrillation   . Mitral regurgitation     Moderate with severe left atrial  enlargement   Past Surgical History  Procedure Laterality Date  . Cholecystectomy    . Hemorrhoid surgery    . Abdominal hysterectomy  1991    Complete hysterectomy for cancer  . Mastectomy  2005    Left breast, tamoxifin  . Goiter    . Hip fracture surgery  2010    Right hip replacement  . Hip fracture surgery  2006    Left hip replacement  . Colonoscopy  ?  Marland Kitchen Cataract extraction, bilateral     Social History:  reports that she has never smoked. She has never used smokeless tobacco. She reports that she does not drink alcohol or use illicit drugs.  Allergies  Allergen Reactions  . Amoxicillin-Pot Clavulanate Diarrhea    Family History  Problem Relation Age of Onset  . Colon cancer Neg Hx   . Heart attack Son     Age 21s, suspected MI  . Diabetes Mother      Prior to Admission medications   Medication Sig Start Date End Date Taking? Authorizing Floretta Petro  citalopram (CELEXA) 20 MG tablet Take 20 mg by mouth daily.   Yes Historical Sriyan Cutting, MD  clonazePAM (KLONOPIN) 0.5 MG tablet Take 0.5 mg by mouth 3 (three) times daily as needed for anxiety.   Yes Historical Jadee Golebiewski, MD  cyanocobalamin (,VITAMIN B-12,) 1000 MCG/ML injection Inject 1,000 mcg into the muscle every 30 (thirty) days.   Yes Historical Deisi Salonga, MD  diltiazem (CARDIZEM) 60 MG tablet  Take 1 tablet (60 mg total) by mouth every 12 (twelve) hours. 08/30/13  Yes Asencion Noble, MD  ferrous sulfate 325 (65 FE) MG tablet Take 325 mg by mouth daily with breakfast.   Yes Historical Maretta Overdorf, MD  fluocinonide cream (LIDEX) 4.09 % Apply 1 application topically daily. Apply sparingly to itchy areas on trunk & extremeties   Yes Historical Deren Degrazia, MD  insulin glargine (LANTUS) 100 UNIT/ML injection Inject 0.06 mLs (6 Units total) into the skin at bedtime. 08/14/13  Yes Asencion Noble, MD  metoprolol tartrate (LOPRESSOR) 25 MG tablet Take 1 tablet (25 mg total) by mouth 2 (two) times daily. 08/30/13  Yes Asencion Noble, MD  pantoprazole  (PROTONIX) 40 MG tablet Take 40 mg by mouth daily.   Yes Historical Raeann Offner, MD  potassium chloride (K-DUR,KLOR-CON) 10 MEQ tablet Take 1 tablet (10 mEq total) by mouth 2 (two) times daily. 08/30/13  Yes Asencion Noble, MD  torsemide (DEMADEX) 20 MG tablet Take 2 tablets (40 mg total) by mouth 2 (two) times daily. 08/30/13  Yes Asencion Noble, MD  triamcinolone cream (KENALOG) 0.1 % Apply 1 application topically daily. Apply to lower extremities.   Yes Historical Terrisha Lopata, MD  acetaminophen (TYLENOL) 325 MG tablet Take 650 mg by mouth every 6 (six) hours as needed for mild pain, fever or headache.     Historical Emmali Karow, MD  albuterol (PROVENTIL HFA;VENTOLIN HFA) 108 (90 BASE) MCG/ACT inhaler Inhale 2 puffs into the lungs every 4 (four) hours as needed for wheezing or shortness of breath.    Historical Siaosi Alter, MD  morphine (ROXANOL) 20 MG/ML concentrated solution Take by mouth every 2 (two) hours as needed for shortness of breath.    Historical Pinkey Mcjunkin, MD  polyethylene glycol (MIRALAX / GLYCOLAX) packet Take 17 g by mouth daily as needed for mild constipation.    Historical Nao Linz, MD   Physical Exam: Filed Vitals:   10/13/13 1459  BP: 102/65  Pulse: 72  Temp: 97.3 F (36.3 C)  Resp:     BP 102/65  Pulse 72  Temp(Src) 97.3 F (36.3 C) (Oral)  Resp 18  Ht 5\' 3"  (1.6 m)  Wt 77 kg (169 lb 12.1 oz)  BMI 30.08 kg/m2  SpO2 95%  General:  Appears calm and comfortable, mildly increased work of breathing Eyes: PERRL, normal lids, irises & conjunctiva ENT: grossly normal hearing, lips & tongue Neck: no LAD, masses or thyromegaly Cardiovascular: S1, S2, irregular 2+ lower extremity edema bilaterally Telemetry: SR, no arrhythmias  Respiratory: Diminished breath sounds at bases, mild increased work of breathing Abdomen: soft, ntnd Skin: Multiple areas of excoriation Musculoskeletal: grossly normal tone BUE/BLE Psychiatric: grossly normal mood and affect, speech fluent and appropriate Neurologic:  grossly non-focal.          Labs on Admission:  Basic Metabolic Panel:  Recent Labs Lab 10/13/13 1101  NA 140  K 4.3  CL 99  CO2 26  GLUCOSE 174*  BUN 67*  CREATININE 3.55*  CALCIUM 8.9   Liver Function Tests:  Recent Labs Lab 10/13/13 1101  AST 10  ALT <5  ALKPHOS 86  BILITOT 0.4  PROT 7.2  ALBUMIN 3.1*   No results found for this basename: LIPASE, AMYLASE,  in the last 168 hours No results found for this basename: AMMONIA,  in the last 168 hours CBC:  Recent Labs Lab 10/13/13 1101  WBC 5.0  NEUTROABS 3.9  HGB 10.7*  HCT 33.1*  MCV 99.7  PLT 190   Cardiac Enzymes:  Recent Labs Lab 10/13/13 1101  TROPONINI <0.30    BNP (last 3 results)  Recent Labs  08/09/13 0807 08/23/13 1202 10/13/13 1101  PROBNP 10577.0* 11032.0* 21091.0*   CBG:  Recent Labs Lab 10/13/13 1704  GLUCAP 135*    Radiological Exams on Admission: Dg Chest Port 1 View  10/13/2013   CLINICAL DATA:  Chest pain.  Hypotension.  EXAM: PORTABLE CHEST - 1 VIEW  COMPARISON:  Multiple exams, including 08/23/2013  FINDINGS: Mildly enlarged cardiopericardial silhouette. Reduced conspicuity left hemidiaphragm with retrocardiac airspace opacity.  Atherosclerotic aortic arch. Low lung volumes are present, causing crowding of the pulmonary vasculature. Subtle right infrahilar density.  IMPRESSION: 1. New retrocardiac airspace opacity without differential diagnostic considerations including atelectasis and pneumonia. 2. Mildly enlarged cardiopericardial silhouette. 3. Low lung volumes. 4. Atherosclerosis.   Electronically Signed   By: Sherryl Barters M.D.   On: 10/13/2013 11:29    EKG: Independently reviewed. Atrial fib  Assessment/Plan Active Problems:   Anemia of other chronic disease   Hypertension   Chronic kidney disease (CKD) stage G4/A1, severely decreased glomerular filtration rate (GFR) between 15-29 mL/min/1.73 square meter and albuminuria creatinine ratio less than 30 mg/g    Chronic respiratory failure   Diabetes   Diastolic CHF, acute on chronic   UTI (urinary tract infection)   HCAP (healthcare-associated pneumonia)   Acute on chronic diastolic CHF (congestive heart failure)   1. Acute on chronic diastolic congestive heart failure. Patient takes Demadex as an outpatient. She was placed on IV Lasix for now. This can likely be transitioned back to [facility] as her clinical condition improves 2. Pneumonia. She'll be started empirically on vancomycin and cefepime for healthcare associated pneumonia. Would quickly de-escalated to Levaquin as her clinical condition allows. 3. Chronic kidney disease stage V. Not a candidate for dialysis. Creatinine appears to be near baseline. We'll continue with diuresis. 4. UTI. Urine culture has been sent. She is on antibiotics 5. End-of-life issues. Patient is being followed by hospice services for end-of-life care. I discussed her multiple comorbidities and expected prognosis. She understands that her clinical condition will likely get worse, she wishes to be kept comfortable during this process. She is currently residing at [facility]. Perhaps it would be reasonable to consider transfer to residential hospice for further end-of-life care. She can likely discharge from the hospital once her symptoms are under reasonable control.  Code Status: DNR Family Communication: discussed with patient Disposition Plan: pending hospital course  Time spent: 53mins  [facility] Pager [phone]  **Disclaimer: This note may have been dictated with voice recognition software. Similar sounding words can inadvertently be transcribed and this note may contain transcription errors which may not have been corrected upon publication of note.**

## 2013-10-14 LAB — CBC
HCT: 32.1 % — ABNORMAL LOW (ref 36.0–46.0)
HEMOGLOBIN: 10.3 g/dL — AB (ref 12.0–15.0)
MCH: 31.7 pg (ref 26.0–34.0)
MCHC: 32.1 g/dL (ref 30.0–36.0)
MCV: 98.8 fL (ref 78.0–100.0)
PLATELETS: 191 10*3/uL (ref 150–400)
RBC: 3.25 MIL/uL — AB (ref 3.87–5.11)
RDW: 14.7 % (ref 11.5–15.5)
WBC: 4.4 10*3/uL (ref 4.0–10.5)

## 2013-10-14 LAB — BASIC METABOLIC PANEL
BUN: 67 mg/dL — AB (ref 6–23)
CALCIUM: 9.1 mg/dL (ref 8.4–10.5)
CO2: 26 mEq/L (ref 19–32)
Chloride: 100 mEq/L (ref 96–112)
Creatinine, Ser: 3.61 mg/dL — ABNORMAL HIGH (ref 0.50–1.10)
GFR, EST AFRICAN AMERICAN: 12 mL/min — AB (ref >90)
GFR, EST NON AFRICAN AMERICAN: 10 mL/min — AB (ref >90)
GLUCOSE: 120 mg/dL — AB (ref 70–99)
Potassium: 4 mEq/L (ref 3.7–5.3)
SODIUM: 141 meq/L (ref 137–147)

## 2013-10-14 LAB — GLUCOSE, CAPILLARY
GLUCOSE-CAPILLARY: 137 mg/dL — AB (ref 70–99)
Glucose-Capillary: 95 mg/dL (ref 70–99)
Glucose-Capillary: 101 mg/dL — ABNORMAL HIGH (ref 70–99)
Glucose-Capillary: 159 mg/dL — ABNORMAL HIGH (ref 70–99)

## 2013-10-14 LAB — MRSA PCR SCREENING: MRSA BY PCR: POSITIVE — AB

## 2013-10-14 MED ORDER — MUPIROCIN 2 % EX OINT
1.0000 "application " | TOPICAL_OINTMENT | Freq: Two times a day (BID) | CUTANEOUS | Status: AC
  Administered 2013-10-14 – 2013-10-18 (×10): 1 via NASAL
  Filled 2013-10-14 (×2): qty 22

## 2013-10-14 MED ORDER — CHLORHEXIDINE GLUCONATE CLOTH 2 % EX PADS
6.0000 | MEDICATED_PAD | Freq: Every day | CUTANEOUS | Status: AC
  Administered 2013-10-14 – 2013-10-18 (×5): 6 via TOPICAL

## 2013-10-14 NOTE — Progress Notes (Signed)
UR Completed Zamoria Boss Graves-Bigelow, RN,BSN [phone]  

## 2013-10-14 NOTE — Care Management Note (Signed)
Page 1 of 2   10/21/2013     7:51:17 AM Asencion Noble, MD Physician Signed Internal Medicine Discharge Summaries Service date: 10/21/2013 7:32 AM  Physician Discharge Summary    Egan Sahlin Strider ZOX:[id number] DOB: 10-Nov-1923 DOA: 10/13/2013     Admit date: 10/13/2013 Discharge date: 10/21/2013   Discharge Diagnoses:  Active Problems:   Anemia of other chronic disease   Hypertension   Chronic kidney disease (CKD) stage G4/A1, severely decreased glomerular filtration rate (GFR) between 15-29 mL/min/1.73 square meter and albuminuria creatinine ratio less than 30 mg/g   Chronic respiratory failure   Diabetes   Diastolic CHF, acute on chronic   UTI (urinary tract infection)   HCAP (healthcare-associated pneumonia)   Acute on chronic diastolic CHF (congestive heart failure)   Pneumonia      Wt Readings from Last 3 Encounters:   10/21/13  182 lb 5.1 oz (82.7 kg)   08/30/13  171 lb 11.8 oz (77.9 kg)   08/19/13  166 lb (75.297 kg)        Hospital Course: This patient is an 80 year old female who was admitted through the emergency room after presenting with shortness of breath and edema in her extremities. She was admitted with a diagnosis of possible pneumonia due to new retrocardiac opacity possibly consistent with acute infection. She was treated with cefepime and vancomycin. She had no fever. She was found to have a staph UTI on urine culture. She was also treated for acute on chronic diastolic heart failure with intravenous Lasix. She gradually had improvement in her marked swelling in the arms and legs. Renal function has remained stable with a BUN and creatinine in the 70 and 3 range. She has been treated with potassium supplementation. Her MGUS has remained stable with a hemoglobin in the 10 range. Her atrial fibrillation has been well controlled with diltiazem and metoprolol. She is not on anticoagulation because of GI bleeding.   Her condition has gradually improved she is able to get  up in the chair. She will require continued supplemental oxygen for pulmonary hypertension. She will require an indwelling Foley for retention. She'll be discharged back to her assisted living facility. Hospice will continue to assist with her care.   She has completed her course of intravenous antibiotic therapy. Routine medications have been continued. Diuretic therapy has been modified back to torsemide 40 mg twice a day.       Discharge Instructions      Medication List              acetaminophen 325 MG tablet   Commonly known as:  TYLENOL   Take 650 mg by mouth every 6 (six) hours as needed for mild pain, fever or headache.         albuterol 108 (90 BASE) MCG/ACT inhaler   Commonly known as:  PROVENTIL HFA;VENTOLIN HFA   Inhale 2 puffs into the lungs every 4 (four) hours as needed for wheezing or shortness of breath.         citalopram 20 MG tablet   Commonly known as:  CELEXA   Take 20 mg by mouth daily.         clonazePAM 0.5 MG tablet   Commonly known as:  KLONOPIN   Take 0.5 mg by mouth 3 (three) times daily as needed for anxiety.         cyanocobalamin 1000 MCG/ML injection   Commonly known as:  (VITAMIN B-12)   Inject 1,000 mcg into  the muscle every 30 (thirty) days.         diltiazem 60 MG tablet   Commonly known as:  CARDIZEM   Take 1 tablet (60 mg total) by mouth every 12 (twelve) hours.         ferrous sulfate 325 (65 FE) MG tablet   Take 325 mg by mouth daily with breakfast.         fluocinonide cream 0.05 %   Commonly known as:  LIDEX   Apply 1 application topically daily. Apply sparingly to itchy areas on trunk & extremeties         insulin glargine 100 UNIT/ML injection   Commonly known as:  LANTUS   Inject 0.06 mLs (6 Units total) into the skin at bedtime.         metoprolol tartrate 25 MG tablet   Commonly known as:  LOPRESSOR   Take 1 tablet (25 mg total) by mouth 2 (two) times daily.         morphine 20 MG/ML concentrated  solution   Commonly known as:  ROXANOL   Take by mouth every 2 (two) hours as needed for shortness of breath.         pantoprazole 40 MG tablet   Commonly known as:  PROTONIX   Take 40 mg by mouth daily.         polyethylene glycol packet   Commonly known as:  MIRALAX / GLYCOLAX   Take 17 g by mouth daily as needed for mild constipation.         potassium chloride 10 MEQ tablet   Commonly known as:  K-DUR,KLOR-CON   Take 1 tablet (10 mEq total) by mouth 2 (two) times daily.         torsemide 20 MG tablet   Commonly known as:  DEMADEX   Take 2 tablets (40 mg total) by mouth 2 (two) times daily.         triamcinolone cream 0.1 %   Commonly known as:  KENALOG   Apply 1 application topically daily. Apply to lower extremities.               FAGAN,ROY 10/21/2013  Routing History...     Date/Time From To Method   10/21/2013 7:38 AM Asencion Noble, MD Asencion Noble, MD Fax       Asencion Noble, MD Physician Signed Internal Medicine Discharge Summaries Service date: 10/21/2013 7:32 AM  Physician Discharge Summary    Mimi Debellis Giambalvo HUD:[id number] DOB: [birth year] DOA: 10/13/2013     Admit date: 10/13/2013 Discharge date: 10/21/2013   Discharge Diagnoses:  Active Problems:   Anemia of other chronic disease   Hypertension   Chronic kidney disease (CKD) stage G4/A1, severely decreased glomerular filtration rate (GFR) between 15-29 mL/min/1.73 square meter and albuminuria creatinine ratio less than 30 mg/g   Chronic respiratory failure   Diabetes   Diastolic CHF, acute on chronic   UTI (urinary tract infection)   HCAP (healthcare-associated pneumonia)   Acute on chronic diastolic CHF (congestive heart failure)   Pneumonia      Wt Readings from Last 3 Encounters:   10/21/13  182 lb 5.1 oz (82.7 kg)   08/30/13  171 lb 11.8 oz (77.9 kg)   08/19/13  166 lb (75.297 kg)        Hospital Course: This patient is an 80 year old female who was admitted through the emergency room after  presenting with shortness of breath and edema in her extremities. She  was admitted with a diagnosis of possible pneumonia due to new retrocardiac opacity possibly consistent with acute infection. She was treated with cefepime and vancomycin. She had no fever. She was found to have a staph UTI on urine culture. She was also treated for acute on chronic diastolic heart failure with intravenous Lasix. She gradually had improvement in her marked swelling in the arms and legs. Renal function has remained stable with a BUN and creatinine in the 70 and 3 range. She has been treated with potassium supplementation. Her MGUS has remained stable with a hemoglobin in the 10 range. Her atrial fibrillation has been well controlled with diltiazem and metoprolol. She is not on anticoagulation because of GI bleeding.   Her condition has gradually improved she is able to get up in the chair. She will require continued supplemental oxygen for pulmonary hypertension. She will require an indwelling Foley for retention. She'll be discharged back to her assisted living facility. Hospice will continue to assist with her care.   She has completed her course of intravenous antibiotic therapy. Routine medications have been continued. Diuretic therapy has been modified back to torsemide 40 mg twice a day.       Discharge Instructions      Medication List              acetaminophen 325 MG tablet   Commonly known as:  TYLENOL   Take 650 mg by mouth every 6 (six) hours as needed for mild pain, fever or headache.         albuterol 108 (90 BASE) MCG/ACT inhaler   Commonly known as:  PROVENTIL HFA;VENTOLIN HFA   Inhale 2 puffs into the lungs every 4 (four) hours as needed for wheezing or shortness of breath.         citalopram 20 MG tablet   Commonly known as:  CELEXA   Take 20 mg by mouth daily.         clonazePAM 0.5 MG tablet   Commonly known as:  KLONOPIN   Take 0.5 mg by mouth 3 (three) times daily as needed  for anxiety.         cyanocobalamin 1000 MCG/ML injection   Commonly known as:  (VITAMIN B-12)   Inject 1,000 mcg into the muscle every 30 (thirty) days.         diltiazem 60 MG tablet   Commonly known as:  CARDIZEM   Take 1 tablet (60 mg total) by mouth every 12 (twelve) hours.         ferrous sulfate 325 (65 FE) MG tablet   Take 325 mg by mouth daily with breakfast.         fluocinonide cream 0.05 %   Commonly known as:  LIDEX   Apply 1 application topically daily. Apply sparingly to itchy areas on trunk & extremeties         insulin glargine 100 UNIT/ML injection   Commonly known as:  LANTUS   Inject 0.06 mLs (6 Units total) into the skin at bedtime.         metoprolol tartrate 25 MG tablet   Commonly known as:  LOPRESSOR   Take 1 tablet (25 mg total) by mouth 2 (two) times daily.         morphine 20 MG/ML concentrated solution   Commonly known as:  ROXANOL   Take by mouth every 2 (two) hours as needed for shortness of breath.         pantoprazole  40 MG tablet   Commonly known as:  PROTONIX   Take 40 mg by mouth daily.         polyethylene glycol packet   Commonly known as:  MIRALAX / GLYCOLAX   Take 17 g by mouth daily as needed for mild constipation.         potassium chloride 10 MEQ tablet   Commonly known as:  K-DUR,KLOR-CON   Take 1 tablet (10 mEq total) by mouth 2 (two) times daily.         torsemide 20 MG tablet   Commonly known as:  DEMADEX   Take 2 tablets (40 mg total) by mouth 2 (two) times daily.         triamcinolone cream 0.1 %   Commonly known as:  KENALOG   Apply 1 application topically daily. Apply to lower extremities.               FAGAN,ROY 10/21/2013  Routing History...     Date/Time From To Method   10/21/2013 7:38 AM Asencion Noble, MD Asencion Noble, MD Fax       CARE MANAGEMENT NOTE 10/21/2013  Patient:  SHIMEKA, BACOT   Account Number:  [id number]  Date Initiated:  10/14/2013  Documentation initiated by:   Claretha Cooper  Subjective/Objective Assessment:   Pt from [facility] with Hospice services. Admitted with CHF.     Action/Plan:   Anticipated DC Date:     Anticipated DC Plan:  HOME W HOSPICE CARE  In-house referral  Clinical Social Worker      DC Planning Services  CM consult      Choice offered to / List presented to:             Status of service:  Completed, signed off Medicare Important Message given?  YES (If response is "NO", the following Medicare IM given date fields will be blank) Date Medicare IM given:  10/21/2013 Date Additional Medicare IM given:    Discharge Disposition:    Per UR Regulation:    If discussed at Long Length of Stay Meetings, dates discussed:   10/19/2013    Comments:  10/21/13 0745 Christinia Gully, RN BSN CM Pt discharged back to [facility] today with resumption of [facility]. Discharge summary sent via PL to Hospice. CSW to arrange discharge to facility.  10/14/13 Amy Dellia Nims RN BSN CM' Fatima Blank from [facility] visited pt.

## 2013-10-14 NOTE — Progress Notes (Signed)
Subjec she was admitted yesterday through the emergency room by the hospitalist. She had chest x-ray evidence of possible pneumonia. She has minimal cough in the morning only she states. She denies any fever. She has a normal white count. She has been started on Vibramycin and cefepime. She has evidence of a urinary tract infection as well but has a chronic indwelling Foley. She complains of swelling of her extremities.  Objective: Vital signs in last 24 hours: Filed Vitals:   10/13/13 2045 10/13/13 2231 10/14/13 0451 10/14/13 0748  BP: 113/71 113/71 98/59   Pulse: 94 94 82   Temp: 97.5 F (36.4 C)  98.1 F (36.7 C)   TempSrc: Oral  Oral   Resp: 18  18   Height:      Weight:   187 lb 6.3 oz (85 kg)   SpO2: 99%  97% 97%   Weight change:   Intake/Output Summary (Last 24 hours) at 10/14/13 0749 Last data filed at 10/14/13 0600  Gross per 24 hour  Intake 676.33 ml  Output    900 ml  Net -223.67 ml    Physical Exam: Alert and in no distress. Lungs reveal basilar rales. Heart irregular with a grade 2 systolic murmur abdomen mildly distended but nontender. Extremities reveal 2+ edema. Skin reveals several bullous lesions.  Lab Results:    Results for orders placed during the hospital encounter of 10/13/13 (from the past 24 hour(s))  TROPONIN I     Status: None   Collection Time    10/13/13 11:01 AM      Result Value Ref Range   Troponin I <0.30  <0.30 ng/mL  PRO B NATRIURETIC PEPTIDE     Status: Abnormal   Collection Time    10/13/13 11:01 AM      Result Value Ref Range   Pro B Natriuretic peptide (BNP) 21091.0 (*) 0 - 450 pg/mL  CBC WITH DIFFERENTIAL     Status: Abnormal   Collection Time    10/13/13 11:01 AM      Result Value Ref Range   WBC 5.0  4.0 - 10.5 K/uL   RBC 3.32 (*) 3.87 - 5.11 MIL/uL   Hemoglobin 10.7 (*) 12.0 - 15.0 g/dL   HCT 33.1 (*) 36.0 - 46.0 %   MCV 99.7  78.0 - 100.0 fL   MCH 32.2  26.0 - 34.0 pg   MCHC 32.3  30.0 - 36.0 g/dL   RDW 14.7  11.5 -  15.5 %   Platelets 190  150 - 400 K/uL   Neutrophils Relative % 77  43 - 77 %   Neutro Abs 3.9  1.7 - 7.7 K/uL   Lymphocytes Relative 6 (*) 12 - 46 %   Lymphs Abs 0.3 (*) 0.7 - 4.0 K/uL   Monocytes Relative 12  3 - 12 %   Monocytes Absolute 0.6  0.1 - 1.0 K/uL   Eosinophils Relative 4  0 - 5 %   Eosinophils Absolute 0.2  0.0 - 0.7 K/uL   Basophils Relative 1  0 - 1 %   Basophils Absolute 0.0  0.0 - 0.1 K/uL  COMPREHENSIVE METABOLIC PANEL     Status: Abnormal   Collection Time    10/13/13 11:01 AM      Result Value Ref Range   Sodium 140  137 - 147 mEq/L   Potassium 4.3  3.7 - 5.3 mEq/L   Chloride 99  96 - 112 mEq/L   CO2 26  19 -  32 mEq/L   Glucose, Bld 174 (*) 70 - 99 mg/dL   BUN 67 (*) 6 - 23 mg/dL   Creatinine, Ser 3.55 (*) 0.50 - 1.10 mg/dL   Calcium 8.9  8.4 - 10.5 mg/dL   Total Protein 7.2  6.0 - 8.3 g/dL   Albumin 3.1 (*) 3.5 - 5.2 g/dL   AST 10  0 - 37 U/L   ALT <5  0 - 35 U/L   Alkaline Phosphatase 86  39 - 117 U/L   Total Bilirubin 0.4  0.3 - 1.2 mg/dL   GFR calc non Af Amer 11 (*) >90 mL/min   GFR calc Af Amer 12 (*) >90 mL/min  URINALYSIS, ROUTINE W REFLEX MICROSCOPIC     Status: Abnormal   Collection Time    10/13/13 11:19 AM      Result Value Ref Range   Color, Urine YELLOW  YELLOW   APPearance CLEAR  CLEAR   Specific Gravity, Urine 1.020  1.005 - 1.030   pH 6.5  5.0 - 8.0   Glucose, UA NEGATIVE  NEGATIVE mg/dL   Hgb urine dipstick LARGE (*) NEGATIVE   Bilirubin Urine NEGATIVE  NEGATIVE   Ketones, ur NEGATIVE  NEGATIVE mg/dL   Protein, ur 100 (*) NEGATIVE mg/dL   Urobilinogen, UA 0.2  0.0 - 1.0 mg/dL   Nitrite NEGATIVE  NEGATIVE   Leukocytes, UA MODERATE (*) NEGATIVE  I-STAT CG4 LACTIC ACID, ED     Status: None   Collection Time    10/13/13 11:19 AM      Result Value Ref Range   Lactic Acid, Venous 1.07  0.5 - 2.2 mmol/L  URINE MICROSCOPIC-ADD ON     Status: Abnormal   Collection Time    10/13/13 11:19 AM      Result Value Ref Range   WBC, UA TOO  NUMEROUS TO COUNT  <3 WBC/hpf   RBC / HPF TOO NUMEROUS TO COUNT  <3 RBC/hpf   Bacteria, UA MANY (*) RARE  GLUCOSE, CAPILLARY     Status: Abnormal   Collection Time    10/13/13  5:04 PM      Result Value Ref Range   Glucose-Capillary 135 (*) 70 - 99 mg/dL   Comment 1 Documented in Chart     Comment 2 Notify RN    GLUCOSE, CAPILLARY     Status: Abnormal   Collection Time    10/13/13  9:16 PM      Result Value Ref Range   Glucose-Capillary 133 (*) 70 - 99 mg/dL  MRSA PCR SCREENING     Status: Abnormal   Collection Time    10/14/13  2:40 AM      Result Value Ref Range   MRSA by PCR POSITIVE (*) NEGATIVE  BASIC METABOLIC PANEL     Status: Abnormal   Collection Time    10/14/13  6:32 AM      Result Value Ref Range   Sodium 141  137 - 147 mEq/L   Potassium 4.0  3.7 - 5.3 mEq/L   Chloride 100  96 - 112 mEq/L   CO2 26  19 - 32 mEq/L   Glucose, Bld 120 (*) 70 - 99 mg/dL   BUN 67 (*) 6 - 23 mg/dL   Creatinine, Ser 3.61 (*) 0.50 - 1.10 mg/dL   Calcium 9.1  8.4 - 10.5 mg/dL   GFR calc non Af Amer 10 (*) >90 mL/min   GFR calc Af Amer 12 (*) >90 mL/min  CBC     Status: Abnormal   Collection Time    10/14/13  6:32 AM      Result Value Ref Range   WBC 4.4  4.0 - 10.5 K/uL   RBC 3.25 (*) 3.87 - 5.11 MIL/uL   Hemoglobin 10.3 (*) 12.0 - 15.0 g/dL   HCT 32.1 (*) 36.0 - 46.0 %   MCV 98.8  78.0 - 100.0 fL   MCH 31.7  26.0 - 34.0 pg   MCHC 32.1  30.0 - 36.0 g/dL   RDW 14.7  11.5 - 15.5 %   Platelets 191  150 - 400 K/uL     ABGS No results found for this basename: PHART, PCO2, PO2ART, TCO2, HCO3,  in the last 72 hours CULTURES Recent Results (from the past 240 hour(s))  MRSA PCR SCREENING     Status: Abnormal   Collection Time    10/14/13  2:40 AM      Result Value Ref Range Status   MRSA by PCR POSITIVE (*) NEGATIVE Final   Comment:            The GeneXpert MRSA Assay (FDA     approved for NASAL specimens     only), is one component of a     comprehensive MRSA colonization      surveillance program. It is not     intended to diagnose MRSA     infection nor to guide or     monitor treatment for     MRSA infections.     RESULT CALLED TO, READ BACK BY AND VERIFIED WITH:     HORINE C AT 0351 ON [id number] BY Mikel Cella K   Studies/Results: Dg Chest Port 1 View  10/13/2013   CLINICAL DATA:  Chest pain.  Hypotension.  EXAM: PORTABLE CHEST - 1 VIEW  COMPARISON:  Multiple exams, including 08/23/2013  FINDINGS: Mildly enlarged cardiopericardial silhouette. Reduced conspicuity left hemidiaphragm with retrocardiac airspace opacity.  Atherosclerotic aortic arch. Low lung volumes are present, causing crowding of the pulmonary vasculature. Subtle right infrahilar density.  IMPRESSION: 1. New retrocardiac airspace opacity without differential diagnostic considerations including atelectasis and pneumonia. 2. Mildly enlarged cardiopericardial silhouette. 3. Low lung volumes. 4. Atherosclerosis.   Electronically Signed   By: Sherryl Barters M.D.   On: 10/13/2013 11:29   Micro Results: Recent Results (from the past 240 hour(s))  MRSA PCR SCREENING     Status: Abnormal   Collection Time    10/14/13  2:40 AM      Result Value Ref Range Status   MRSA by PCR POSITIVE (*) NEGATIVE Final   Comment:            The GeneXpert MRSA Assay (FDA     approved for NASAL specimens     only), is one component of a     comprehensive MRSA colonization     surveillance program. It is not     intended to diagnose MRSA     infection nor to guide or     monitor treatment for     MRSA infections.     RESULT CALLED TO, READ BACK BY AND VERIFIED WITH:     HORINE C AT 0351 ON [id number] BY Mikel Cella K   Studies/Results: Dg Chest Port 1 View  10/13/2013   CLINICAL DATA:  Chest pain.  Hypotension.  EXAM: PORTABLE CHEST - 1 VIEW  COMPARISON:  Multiple exams, including 08/23/2013  FINDINGS: Mildly enlarged cardiopericardial silhouette. Reduced conspicuity left hemidiaphragm with  retrocardiac airspace opacity.   Atherosclerotic aortic arch. Low lung volumes are present, causing crowding of the pulmonary vasculature. Subtle right infrahilar density.  IMPRESSION: 1. New retrocardiac airspace opacity without differential diagnostic considerations including atelectasis and pneumonia. 2. Mildly enlarged cardiopericardial silhouette. 3. Low lung volumes. 4. Atherosclerosis.   Electronically Signed   By: Sherryl Barters M.D.   On: 10/13/2013 11:29   Medications:  I have reviewed the patient's current medications Scheduled Meds: . ceFEPime (MAXIPIME) IV  1 g Intravenous Q24H  . Chlorhexidine Gluconate Cloth  6 each Topical Q0600  . citalopram  20 mg Oral Daily  . diltiazem  60 mg Oral Q12H  . enoxaparin (LOVENOX) injection  30 mg Subcutaneous Q24H  . ferrous sulfate  325 mg Oral Q breakfast  . fluocinonide cream  1 application Topical Daily  . furosemide  60 mg Intravenous Q12H  . insulin glargine  6 Units Subcutaneous QHS  . metoprolol tartrate  25 mg Oral BID  . mupirocin ointment  1 application Nasal BID  . pantoprazole  40 mg Oral Daily  . potassium chloride  10 mEq Oral BID  . sodium chloride  3 mL Intravenous Q12H  . sodium chloride  3 mL Intravenous Q12H  . triamcinolone cream  1 application Topical Daily  . vancomycin  1,000 mg Intravenous Q48H   Continuous Infusions: . sodium chloride 10 mL/hr at 10/13/13 1122   PRN Meds:.sodium chloride, acetaminophen, acetaminophen, clonazePAM, morphine, ondansetron (ZOFRAN) IV, ondansetron, polyethylene glycol, sodium chloride   Assessment/Plan: #1. Pneumonia. Continue cefepime and vancomycin. #2. UTI. Culture pending. #3. End stage heart failure. Continue IV Lasix. #4. Chronic atrial fibrillation. Continue diltiazem and metoprolol. Anti-coagulation was discontinued because of GI bleeding. #5. Epidermal lysis bullosa. #6. Diabetes. #7. Chronic renal failure. Creatinine 3.5.  It seems that there was a lack of embracing the hospice philosophy with the  patient's emergency room presentation and subsequent hospitalization yesterday. Will consult further with hospice. Active Problems:   Anemia of other chronic disease   Hypertension   Chronic kidney disease (CKD) stage G4/A1, severely decreased glomerular filtration rate (GFR) between 15-29 mL/min/1.73 square meter and albuminuria creatinine ratio less than 30 mg/g   Chronic respiratory failure   Diabetes   Diastolic CHF, acute on chronic   UTI (urinary tract infection)   HCAP (healthcare-associated pneumonia)   Acute on chronic diastolic CHF (congestive heart failure)     LOS: 1 day   Catherine [name],Catherine [name] 10/14/2013, 7:49 AM

## 2013-10-14 NOTE — Clinical Social Work Psychosocial (Signed)
Clinical Social Work Department BRIEF PSYCHOSOCIAL ASSESSMENT 10/14/2013  Patient:  SAYDEE, ZOLMAN     Account Number:  [id number]     Admit date:  10/13/2013  Clinical Social Worker:  Wyatt Haste  Date/Time:  10/14/2013 09:21 AM  Referred by:  CSW  Date Referred:  10/14/2013 Referred for  ALF Placement   Other Referral:   Interview type:  Patient Other interview type:    PSYCHOSOCIAL DATA Living Status:  FACILITY Admitted from facility:  [facility] Level of care:  Assisted Living Primary support name:  Levada Dy Primary support relationship to patient:  FAMILY Degree of support available:   supportive    CURRENT CONCERNS Current Concerns  Post-Acute Placement   Other Concerns:    SOCIAL WORK ASSESSMENT / PLAN CSW met with pt at bedside. Pt alert and oriented and well known to CSW from previous admissions. Pt from [facility], where she has been a resident for over a year. She has several step-children who are her best support and check on her often. Pt states that they look after her very well. She was sent to hospital yesterday after appointment with Dr. Michela Pitcher. Pt d/c in April from hospital back to [facility] with hospice. Per Tammy at [facility], hospice RN visits pt several times weekly through [facility]. She is primarily wheelchair bound and requires extensive assist with ADLs. Pt has a chronic foley. Okay to return at d/c. Although pt would prefer to be able to return home, she understands this is not feasible and is agreeable to return to [facility]. Pt indicates that [facility] is next best option.   Assessment/plan status:  Psychosocial Support/Ongoing Assessment of Needs Other assessment/ plan:   Information/referral to community resources:   [facility]    PATIENT'S/FAMILY'S RESPONSE TO PLAN OF CARE: Pt requesting RN at end of assessment and CSW called. CSW will continue to follow.       [name], [place]

## 2013-10-15 DIAGNOSIS — Z66 Do not resuscitate: Secondary | ICD-10-CM | POA: Diagnosis present

## 2013-10-15 DIAGNOSIS — A4902 Methicillin resistant Staphylococcus aureus infection, unspecified site: Secondary | ICD-10-CM | POA: Diagnosis present

## 2013-10-15 DIAGNOSIS — I509 Heart failure, unspecified: Secondary | ICD-10-CM | POA: Diagnosis present

## 2013-10-15 DIAGNOSIS — K5900 Constipation, unspecified: Secondary | ICD-10-CM | POA: Diagnosis present

## 2013-10-15 DIAGNOSIS — J9610 Chronic respiratory failure, unspecified whether with hypoxia or hypercapnia: Secondary | ICD-10-CM | POA: Diagnosis present

## 2013-10-15 DIAGNOSIS — I5033 Acute on chronic diastolic (congestive) heart failure: Secondary | ICD-10-CM | POA: Diagnosis present

## 2013-10-15 DIAGNOSIS — I120 Hypertensive chronic kidney disease with stage 5 chronic kidney disease or end stage renal disease: Secondary | ICD-10-CM | POA: Diagnosis present

## 2013-10-15 DIAGNOSIS — Z9010 Acquired absence of unspecified breast and nipple: Secondary | ICD-10-CM | POA: Diagnosis not present

## 2013-10-15 DIAGNOSIS — Z853 Personal history of malignant neoplasm of breast: Secondary | ICD-10-CM | POA: Diagnosis not present

## 2013-10-15 DIAGNOSIS — D638 Anemia in other chronic diseases classified elsewhere: Secondary | ICD-10-CM | POA: Diagnosis present

## 2013-10-15 DIAGNOSIS — Z79899 Other long term (current) drug therapy: Secondary | ICD-10-CM | POA: Diagnosis not present

## 2013-10-15 DIAGNOSIS — J449 Chronic obstructive pulmonary disease, unspecified: Secondary | ICD-10-CM | POA: Diagnosis present

## 2013-10-15 DIAGNOSIS — N185 Chronic kidney disease, stage 5: Secondary | ICD-10-CM | POA: Diagnosis present

## 2013-10-15 DIAGNOSIS — E876 Hypokalemia: Secondary | ICD-10-CM | POA: Diagnosis present

## 2013-10-15 DIAGNOSIS — I4891 Unspecified atrial fibrillation: Secondary | ICD-10-CM | POA: Diagnosis present

## 2013-10-15 DIAGNOSIS — J189 Pneumonia, unspecified organism: Secondary | ICD-10-CM | POA: Diagnosis present

## 2013-10-15 DIAGNOSIS — N390 Urinary tract infection, site not specified: Secondary | ICD-10-CM | POA: Diagnosis present

## 2013-10-15 DIAGNOSIS — M810 Age-related osteoporosis without current pathological fracture: Secondary | ICD-10-CM | POA: Diagnosis present

## 2013-10-15 DIAGNOSIS — Z96649 Presence of unspecified artificial hip joint: Secondary | ICD-10-CM | POA: Diagnosis not present

## 2013-10-15 DIAGNOSIS — I2789 Other specified pulmonary heart diseases: Secondary | ICD-10-CM | POA: Diagnosis present

## 2013-10-15 DIAGNOSIS — I959 Hypotension, unspecified: Secondary | ICD-10-CM | POA: Diagnosis present

## 2013-10-15 DIAGNOSIS — Z833 Family history of diabetes mellitus: Secondary | ICD-10-CM | POA: Diagnosis not present

## 2013-10-15 DIAGNOSIS — E119 Type 2 diabetes mellitus without complications: Secondary | ICD-10-CM | POA: Diagnosis present

## 2013-10-15 DIAGNOSIS — Z794 Long term (current) use of insulin: Secondary | ICD-10-CM | POA: Diagnosis not present

## 2013-10-15 DIAGNOSIS — Z8249 Family history of ischemic heart disease and other diseases of the circulatory system: Secondary | ICD-10-CM | POA: Diagnosis not present

## 2013-10-15 DIAGNOSIS — Z9981 Dependence on supplemental oxygen: Secondary | ICD-10-CM | POA: Diagnosis not present

## 2013-10-15 DIAGNOSIS — R0602 Shortness of breath: Secondary | ICD-10-CM | POA: Diagnosis present

## 2013-10-15 LAB — BASIC METABOLIC PANEL
BUN: 70 mg/dL — AB (ref 6–23)
CO2: 25 mEq/L (ref 19–32)
Calcium: 9.1 mg/dL (ref 8.4–10.5)
Chloride: 101 mEq/L (ref 96–112)
Creatinine, Ser: 3.37 mg/dL — ABNORMAL HIGH (ref 0.50–1.10)
GFR calc Af Amer: 13 mL/min — ABNORMAL LOW (ref >90)
GFR calc non Af Amer: 11 mL/min — ABNORMAL LOW (ref >90)
GLUCOSE: 126 mg/dL — AB (ref 70–99)
Potassium: 3.9 mEq/L (ref 3.7–5.3)
Sodium: 142 mEq/L (ref 137–147)

## 2013-10-15 LAB — URINE CULTURE: Colony Count: >=100000

## 2013-10-15 MED ORDER — BISACODYL 5 MG PO TBEC
10.0000 mg | DELAYED_RELEASE_TABLET | Freq: Once | ORAL | Status: AC
  Administered 2013-10-15: 10 mg via ORAL
  Filled 2013-10-15: qty 2

## 2013-10-15 NOTE — Progress Notes (Signed)
Subjective: She feels that her swelling is going down. She complains of constipation. She does not had increased shortness of breath. She has had no fever.  Objective: Vital signs in last 24 hours: Filed Vitals:   10/14/13 0748 10/14/13 1400 10/14/13 1944 10/15/13 0625  BP:  96/59 121/81 137/83  Pulse:  70 97 80  Temp:  97.8 F (36.6 C) 97.6 F (36.4 C) 97.4 F (36.3 C)  TempSrc:  Oral Oral Oral  Resp:  18 18 22   Height:      Weight:  182 lb 15.7 oz (83 kg)    SpO2: 97%  92% 97%   Weight change: 12 lb 15.7 oz (5.889 kg)  Intake/Output Summary (Last 24 hours) at 10/15/13 0728 Last data filed at 10/15/13 0600  Gross per 24 hour  Intake   1130 ml  Output   1675 ml  Net   -545 ml    Physical Exam: Alert and comfortable appearing. Lungs reveal basilar rales. Heart irregular with a grade 2 systolic murmur. Abdomen soft and nontender. Extremities reveal 2+ edema in the arms and legs.  Lab Results:    Results for orders placed during the hospital encounter of 10/13/13 (from the past 24 hour(s))  GLUCOSE, CAPILLARY     Status: None   Collection Time    10/14/13  8:08 AM      Result Value Ref Range   Glucose-Capillary 95  70 - 99 mg/dL  GLUCOSE, CAPILLARY     Status: Abnormal   Collection Time    10/14/13 11:51 AM      Result Value Ref Range   Glucose-Capillary 101 (*) 70 - 99 mg/dL  GLUCOSE, CAPILLARY     Status: Abnormal   Collection Time    10/14/13  4:42 PM      Result Value Ref Range   Glucose-Capillary 137 (*) 70 - 99 mg/dL   Comment 1 Notify RN     Comment 2 Documented in Chart    GLUCOSE, CAPILLARY     Status: Abnormal   Collection Time    10/14/13  9:00 PM      Result Value Ref Range   Glucose-Capillary 159 (*) 70 - 99 mg/dL   Comment 1 Notify RN    BASIC METABOLIC PANEL     Status: Abnormal   Collection Time    10/15/13  5:36 AM      Result Value Ref Range   Sodium 142  137 - 147 mEq/L   Potassium 3.9  3.7 - 5.3 mEq/L   Chloride 101  96 - 112 mEq/L   CO2 25  19 - 32 mEq/L   Glucose, Bld 126 (*) 70 - 99 mg/dL   BUN 70 (*) 6 - 23 mg/dL   Creatinine, Ser 3.37 (*) 0.50 - 1.10 mg/dL   Calcium 9.1  8.4 - 10.5 mg/dL   GFR calc non Af Amer 11 (*) >90 mL/min   GFR calc Af Amer 13 (*) >90 mL/min     ABGS No results found for this basename: PHART, PCO2, PO2ART, TCO2, HCO3,  in the last 72 hours CULTURES Recent Results (from the past 240 hour(s))  URINE CULTURE     Status: None   Collection Time    10/13/13 11:19 AM      Result Value Ref Range Status   Specimen Description URINE, CATHETERIZED   Final   Special Requests NONE   Final   Culture  Setup Time     Final  Value: 10/13/2013 18:14     Performed at [facility]     Final   Value: >=100,000 COLONIES/ML     Performed at [facility]   Culture     Final   Value: STAPHYLOCOCCUS AUREUS     Note: RIFAMPIN AND GENTAMICIN SHOULD NOT BE USED AS SINGLE DRUGS FOR TREATMENT OF STAPH INFECTIONS.     Performed at [facility]   Report Status PENDING   Incomplete  MRSA PCR SCREENING     Status: Abnormal   Collection Time    10/14/13  2:40 AM      Result Value Ref Range Status   MRSA by PCR POSITIVE (*) NEGATIVE Final   Comment:            The GeneXpert MRSA Assay (FDA     approved for NASAL specimens     only), is one component of a     comprehensive MRSA colonization     surveillance program. It is not     intended to diagnose MRSA     infection nor to guide or     monitor treatment for     MRSA infections.     RESULT CALLED TO, READ BACK BY AND VERIFIED WITH:     HORINE C AT 0351 ON [id number] BY Mikel Cella K   Studies/Results: Dg Chest Port 1 View  10/13/2013   CLINICAL DATA:  Chest pain.  Hypotension.  EXAM: PORTABLE CHEST - 1 VIEW  COMPARISON:  Multiple exams, including 08/23/2013  FINDINGS: Mildly enlarged cardiopericardial silhouette. Reduced conspicuity left hemidiaphragm with retrocardiac airspace opacity.  Atherosclerotic aortic arch. Low  lung volumes are present, causing crowding of the pulmonary vasculature. Subtle right infrahilar density.  IMPRESSION: 1. New retrocardiac airspace opacity without differential diagnostic considerations including atelectasis and pneumonia. 2. Mildly enlarged cardiopericardial silhouette. 3. Low lung volumes. 4. Atherosclerosis.   Electronically Signed   By: Sherryl Barters M.D.   On: 10/13/2013 11:29   Micro Results: Recent Results (from the past 240 hour(s))  URINE CULTURE     Status: None   Collection Time    10/13/13 11:19 AM      Result Value Ref Range Status   Specimen Description URINE, CATHETERIZED   Final   Special Requests NONE   Final   Culture  Setup Time     Final   Value: 10/13/2013 18:14     Performed at [facility]     Final   Value: >=100,000 COLONIES/ML     Performed at [facility]   Culture     Final   Value: STAPHYLOCOCCUS AUREUS     Note: RIFAMPIN AND GENTAMICIN SHOULD NOT BE USED AS SINGLE DRUGS FOR TREATMENT OF STAPH INFECTIONS.     Performed at [facility]   Report Status PENDING   Incomplete  MRSA PCR SCREENING     Status: Abnormal   Collection Time    10/14/13  2:40 AM      Result Value Ref Range Status   MRSA by PCR POSITIVE (*) NEGATIVE Final   Comment:            The GeneXpert MRSA Assay (FDA     approved for NASAL specimens     only), is one component of a     comprehensive MRSA colonization     surveillance program. It is not     intended to diagnose MRSA  infection nor to guide or     monitor treatment for     MRSA infections.     RESULT CALLED TO, READ BACK BY AND VERIFIED WITH:     HORINE C AT 0351 ON [id number] BY Mikel Cella K   Studies/Results: Dg Chest Port 1 View  10/13/2013   CLINICAL DATA:  Chest pain.  Hypotension.  EXAM: PORTABLE CHEST - 1 VIEW  COMPARISON:  Multiple exams, including 08/23/2013  FINDINGS: Mildly enlarged cardiopericardial silhouette. Reduced conspicuity left hemidiaphragm with  retrocardiac airspace opacity.  Atherosclerotic aortic arch. Low lung volumes are present, causing crowding of the pulmonary vasculature. Subtle right infrahilar density.  IMPRESSION: 1. New retrocardiac airspace opacity without differential diagnostic considerations including atelectasis and pneumonia. 2. Mildly enlarged cardiopericardial silhouette. 3. Low lung volumes. 4. Atherosclerosis.   Electronically Signed   By: Sherryl Barters M.D.   On: 10/13/2013 11:29   Medications:  I have reviewed the patient's current medications Scheduled Meds: . bisacodyl  10 mg Oral Once  . ceFEPime (MAXIPIME) IV  1 g Intravenous Q24H  . Chlorhexidine Gluconate Cloth  6 each Topical Q0600  . citalopram  20 mg Oral Daily  . diltiazem  60 mg Oral Q12H  . enoxaparin (LOVENOX) injection  30 mg Subcutaneous Q24H  . ferrous sulfate  325 mg Oral Q breakfast  . fluocinonide cream  1 application Topical Daily  . furosemide  60 mg Intravenous Q12H  . insulin glargine  6 Units Subcutaneous QHS  . metoprolol tartrate  25 mg Oral BID  . mupirocin ointment  1 application Nasal BID  . pantoprazole  40 mg Oral Daily  . potassium chloride  10 mEq Oral BID  . sodium chloride  3 mL Intravenous Q12H  . sodium chloride  3 mL Intravenous Q12H  . triamcinolone cream  1 application Topical Daily  . vancomycin  1,000 mg Intravenous Q48H   Continuous Infusions: . sodium chloride 10 mL/hr at 10/13/13 1122   PRN Meds:.sodium chloride, acetaminophen, acetaminophen, clonazePAM, morphine, ondansetron (ZOFRAN) IV, ondansetron, polyethylene glycol, sodium chloride   Assessment/Plan: #1. Possible pneumonia. Again, symptoms are minimal but the chest x-ray indicates a new infiltrate. Continue antibiotic therapy with cefepime and vancomycin. #2. Urinary tract infection. She has an indwelling catheter which has been changed. Her culture reveals greater than 100,000 staph. #3. Diabetes. Stable. Continue Lantus. #4. Congestive heart  failure. Continue IV Lasix. #5. Pulmonary hypertension. #6. Chronic atrial fibrillation. Anticoagulation has been discontinued because of GI bleed. #7. MGUS. Stable. #8. Chronic renal failure. BUN and creatinine are 70 and 3.3. Potassium is normal. Active Problems:   Anemia of other chronic disease   Hypertension   Chronic kidney disease (CKD) stage G4/A1, severely decreased glomerular filtration rate (GFR) between 15-29 mL/min/1.73 square meter and albuminuria creatinine ratio less than 30 mg/g   Chronic respiratory failure   Diabetes   Diastolic CHF, acute on chronic   UTI (urinary tract infection)   HCAP (healthcare-associated pneumonia)   Acute on chronic diastolic CHF (congestive heart failure)     LOS: 2 days   FAGAN,ROY 10/15/2013, 7:28 AM

## 2013-10-15 NOTE — Progress Notes (Signed)
It was stated in report that patient's OP urologist had requested that pt have foley catheter replaced during this admission. Urologist indicated that patient would need it changed in a few days anyways. Foley catheter replaced. Patient tolerated procedure well. Will continue to provide catheter care and continue to monitor.

## 2013-10-15 NOTE — Progress Notes (Signed)
ANTIBIOTIC CONSULT NOTE - follow up  Pharmacy Consult for Vancomycin & Cefepime Indication: pneumonia  Allergies  Allergen Reactions  . Amoxicillin-Pot Clavulanate Diarrhea   Patient Measurements: Height: 5\' 3"  (160 cm) Weight: 182 lb 15.7 oz (83 kg) IBW/kg (Calculated) : 52.4  Vital Signs: Temp: 97.4 F (36.3 C) (06/19 0625) Temp src: Oral (06/19 0625) BP: 137/83 mmHg (06/19 0625) Pulse Rate: 80 (06/19 0625) Intake/Output from previous day: 06/18 0701 - 06/19 0700 In: 1130 [P.O.:840; I.V.:240; IV Piggyback:50] Out: 6270 [Urine:1675] Intake/Output from this shift: Total I/O In: 240 [P.O.:240] Out: -   Labs:  Recent Labs  10/13/13 1101 10/14/13 0632 10/15/13 0536  WBC 5.0 4.4  --   HGB 10.7* 10.3*  --   PLT 190 191  --   CREATININE 3.55* 3.61* 3.37*   Estimated Creatinine Clearance: 12 ml/min (by C-G formula based on Cr of 3.37). No results found for this basename: VANCOTROUGH, Corlis Leak, VANCORANDOM, GENTTROUGH, GENTPEAK, GENTRANDOM, TOBRATROUGH, TOBRAPEAK, TOBRARND, AMIKACINPEAK, AMIKACINTROU, AMIKACIN,  in the last 72 hours   Microbiology: Recent Results (from the past 720 hour(s))  URINE CULTURE     Status: None   Collection Time    10/13/13 11:19 AM      Result Value Ref Range Status   Specimen Description URINE, CATHETERIZED   Final   Special Requests NONE   Final   Culture  Setup Time     Final   Value: 10/13/2013 18:14     Performed at [place]     Final   Value: >=100,000 COLONIES/ML     Performed at [facility]   Culture     Final   Value: STAPHYLOCOCCUS AUREUS     Note: RIFAMPIN AND GENTAMICIN SHOULD NOT BE USED AS SINGLE DRUGS FOR TREATMENT OF STAPH INFECTIONS.     Performed at [facility]   Report Status PENDING   Incomplete  MRSA PCR SCREENING     Status: Abnormal   Collection Time    10/14/13  2:40 AM      Result Value Ref Range Status   MRSA by PCR POSITIVE (*) NEGATIVE Final   Comment:             The GeneXpert MRSA Assay (FDA     approved for NASAL specimens     only), is one component of a     comprehensive MRSA colonization     surveillance program. It is not     intended to diagnose MRSA     infection nor to guide or     monitor treatment for     MRSA infections.     RESULT CALLED TO, READ BACK BY AND VERIFIED WITH:     HORINE C AT [place] ON [id number] BY FORSYTH K   Medical History: Past Medical History  Diagnosis Date  . Diastolic heart failure     LVEF 60%  . Type 2 diabetes mellitus   . Essential hypertension, benign   . COPD (chronic obstructive pulmonary disease)     Home O2  . Vertigo   . Breast cancer   . Stroke      Patient denies  . Skin disorder     Epidermolysis bullosa.  . Pulmonary hypertension   . Multiple thyroid nodules   . Anemia   . Aortic stenosis   . Monoclonal gammopathy   . Diverticulosis   . Squamous cell carcinoma   . Osteoporosis   . CKD (chronic  kidney disease) stage 4, GFR 15-29 ml/min   . Atrial fibrillation   . Mitral regurgitation     Moderate with severe left atrial enlargement   Medications:  Scheduled:  . ceFEPime (MAXIPIME) IV  1 g Intravenous Q24H  . Chlorhexidine Gluconate Cloth  6 each Topical Q0600  . citalopram  20 mg Oral Daily  . diltiazem  60 mg Oral Q12H  . enoxaparin (LOVENOX) injection  30 mg Subcutaneous Q24H  . ferrous sulfate  325 mg Oral Q breakfast  . fluocinonide cream  1 application Topical Daily  . furosemide  60 mg Intravenous Q12H  . insulin glargine  6 Units Subcutaneous QHS  . metoprolol tartrate  25 mg Oral BID  . mupirocin ointment  1 application Nasal BID  . pantoprazole  40 mg Oral Daily  . potassium chloride  10 mEq Oral BID  . sodium chloride  3 mL Intravenous Q12H  . sodium chloride  3 mL Intravenous Q12H  . triamcinolone cream  1 application Topical Daily  . vancomycin  1,000 mg Intravenous Q48H   Assessment: 80 yo F with end stage HF & CKD who is being followed by Hospice  presented to ED today from MD office for hypotension & SOB.  CXR + for developing PNA.  UA +infection.  She lives in a nursing facility & is therefore being started on empiric, broad-spectrum antibiotics.  She is afebrile with normal WBC.  She has Stage IV-V CKD.  Estimated CrCl ~10-90ml/min which is patient's baseline.   Vancomycin 6/17>> Cefepime 6/17>>   Goal of Therapy:  Vancomycin trough level 15-20 mcg/ml  Plan:  Cefepime 1gm IV q24h Vancomycin 1gm IV q48h Check Vancomycin trough at steady state Monitor renal function and cx data   Hart Robinsons A 10/15/2013,11:05 AM

## 2013-10-15 NOTE — Clinical Social Work Note (Signed)
Spoke with MD regarding pt. Anticipate pt remaining in hospital through weekend. [place] updated on pt. Will continue to follow.  [name], [place]

## 2013-10-16 MED ORDER — FUROSEMIDE 10 MG/ML IJ SOLN
80.0000 mg | Freq: Three times a day (TID) | INTRAMUSCULAR | Status: DC
  Filled 2013-10-16: qty 8

## 2013-10-16 MED ORDER — FUROSEMIDE 10 MG/ML IJ SOLN
80.0000 mg | Freq: Three times a day (TID) | INTRAMUSCULAR | Status: DC
  Administered 2013-10-16 – 2013-10-21 (×14): 80 mg via INTRAVENOUS
  Filled 2013-10-16 (×14): qty 8

## 2013-10-16 NOTE — Progress Notes (Signed)
ANTIBIOTIC CONSULT NOTE - follow up  Pharmacy Consult for Vancomycin & Cefepime Indication: pneumonia  Allergies  Allergen Reactions  . Amoxicillin-Pot Clavulanate Diarrhea   Patient Measurements: Height: 5\' 3"  (160 cm) Weight: 182 lb 15.7 oz (83 kg) IBW/kg (Calculated) : 52.4  Vital Signs: Temp: 98.2 F (36.8 C) (06/20 0626) Temp src: Oral (06/20 0626) BP: 116/75 mmHg (06/20 0626) Pulse Rate: 78 (06/20 0626) Intake/Output from previous day: 06/19 0701 - 06/20 0700 In: 720 [P.O.:720] Out: 1900 [Urine:1900] Intake/Output from this shift:    Labs:  Recent Labs  10/13/13 1101 10/14/13 0632 10/15/13 0536  WBC 5.0 4.4  --   HGB 10.7* 10.3*  --   PLT 190 191  --   CREATININE 3.55* 3.61* 3.37*   Estimated Creatinine Clearance: 12 ml/min (by C-G formula based on Cr of 3.37). No results found for this basename: VANCOTROUGH, Corlis Leak, VANCORANDOM, GENTTROUGH, GENTPEAK, GENTRANDOM, TOBRATROUGH, TOBRAPEAK, TOBRARND, AMIKACINPEAK, AMIKACINTROU, AMIKACIN,  in the last 72 hours   Microbiology: Recent Results (from the past 720 hour(s))  URINE CULTURE     Status: None   Collection Time    10/13/13 11:19 AM      Result Value Ref Range Status   Specimen Description URINE, CATHETERIZED   Final   Special Requests NONE   Final   Culture  Setup Time     Final   Value: 10/13/2013 18:14     Performed at [place]     Final   Value: >=100,000 COLONIES/ML     Performed at [facility]   Culture     Final   Value: METHICILLIN RESISTANT STAPHYLOCOCCUS AUREUS     Note: RIFAMPIN AND GENTAMICIN SHOULD NOT BE USED AS SINGLE DRUGS FOR TREATMENT OF STAPH INFECTIONS.     Performed at [facility]   Report Status 10/15/2013 FINAL   Final   Organism ID, Bacteria METHICILLIN RESISTANT STAPHYLOCOCCUS AUREUS   Final  MRSA PCR SCREENING     Status: Abnormal   Collection Time    10/14/13  2:40 AM      Result Value Ref Range Status   MRSA by PCR  POSITIVE (*) NEGATIVE Final   Comment:            The GeneXpert MRSA Assay (FDA     approved for NASAL specimens     only), is one component of a     comprehensive MRSA colonization     surveillance program. It is not     intended to diagnose MRSA     infection nor to guide or     monitor treatment for     MRSA infections.     RESULT CALLED TO, READ BACK BY AND VERIFIED WITH:     HORINE C AT [place] ON [id number] BY FORSYTH K   Medical History: Past Medical History  Diagnosis Date  . Diastolic heart failure     LVEF 60%  . Type 2 diabetes mellitus   . Essential hypertension, benign   . COPD (chronic obstructive pulmonary disease)     Home O2  . Vertigo   . Breast cancer   . Stroke      Patient denies  . Skin disorder     Epidermolysis bullosa.  . Pulmonary hypertension   . Multiple thyroid nodules   . Anemia   . Aortic stenosis   . Monoclonal gammopathy   . Diverticulosis   . Squamous cell carcinoma   .  Osteoporosis   . CKD (chronic kidney disease) stage 4, GFR 15-29 ml/min   . Atrial fibrillation   . Mitral regurgitation     Moderate with severe left atrial enlargement   Medications:  Scheduled:  . ceFEPime (MAXIPIME) IV  1 g Intravenous Q24H  . Chlorhexidine Gluconate Cloth  6 each Topical Q0600  . citalopram  20 mg Oral Daily  . diltiazem  60 mg Oral Q12H  . enoxaparin (LOVENOX) injection  30 mg Subcutaneous Q24H  . ferrous sulfate  325 mg Oral Q breakfast  . fluocinonide cream  1 application Topical Daily  . furosemide  60 mg Intravenous Q12H  . insulin glargine  6 Units Subcutaneous QHS  . metoprolol tartrate  25 mg Oral BID  . mupirocin ointment  1 application Nasal BID  . pantoprazole  40 mg Oral Daily  . potassium chloride  10 mEq Oral BID  . sodium chloride  3 mL Intravenous Q12H  . sodium chloride  3 mL Intravenous Q12H  . triamcinolone cream  1 application Topical Daily  . vancomycin  1,000 mg Intravenous Q48H   Assessment: 80 yo F with end stage HF  & CKD who is being followed by Hospice presented to ED today from MD office for hypotension & SOB.  CXR + for developing PNA.  UA +infection.  She lives in a nursing facility & is therefore being started on empiric, broad-spectrum antibiotics.  She is afebrile with normal WBC.  She has Stage IV-V CKD.  Estimated CrCl ~10-82ml/min which is patient's baseline.  Culture for MRSA noted.  Culture -        Result:        METHICILLIN RESISTANT STAPHYLOCOCCUS AUREUS Note: RIFAMPIN AND GENTAMICIN SHOULD NOT BE USED AS SINGLE DRUGS FOR TREATMENT OF STAPH INFECTIONS. Performed at [facility]      Report Status 10/15/2013 FINAL        Organism ID, Bacteria METHICILLIN RESISTANT STAPHYLOCOCCUS AUREUS      Culture & Susceptibility    METHICILLIN RESISTANT STAPHYLOCOCCUS AUREUS    Antibiotic Sensitivity Microscan Status    GENTAMICIN Intermediate 8 INTERMEDIATE Final    Method: MIC    LEVOFLOXACIN Resistant >=8 RESISTANT Final    Method: MIC    NITROFURANTOIN Sensitive <=16 SENSITIVE Final    Method: MIC    OXACILLIN Resistant >=4 RESISTANT Final    Method: MIC    PENICILLIN Resistant >=0.5 RESISTANT Final    Method: MIC    RIFAMPIN Sensitive <=0.5 SENSITIVE Final    Method: MIC    TETRACYCLINE Resistant >=16 RESISTANT Final    Method: MIC    TRIMETH/SULFA Sensitive 20 SENSITIVE Final    Method: MIC    VANCOMYCIN Sensitive 1 SENSITIVE Final    Method: MIC    Comments METHICILLIN RESISTANT STAPHYLOCOCCUS AUREUS (MIC)    METHICILLIN RESISTANT STAPHYLOCOCCUS AUREUS      Vancomycin 6/17>> Cefepime 6/17>>   Goal of Therapy:  Vancomycin trough level 15-20 mcg/ml  Plan:  Cefepime 1gm IV q24h Vancomycin 1gm IV q48h Check Vancomycin trough at steady state Monitor renal function and cx data   Hart Robinsons A 10/16/2013,9:48 AM

## 2013-10-16 NOTE — Progress Notes (Signed)
Catherine [name] [place] CZY:[id number] DOB: Jul 20, 1923 DOA: 10/13/2013 PCP: Asencion Noble, MD   Subjective: This lady remains dyspneic even at rest. She remains edematous. Urine is growing MRSA. She is on vancomycin. There is a questionable pneumonia. I think most of her symptoms are related to congestive heart failure.           Physical Exam: Blood pressure 116/75, pulse 78, temperature 98.2 F (36.8 C), temperature source Oral, resp. rate 20, height 5\' 3"  (1.6 m), weight 83 kg (182 lb 15.7 oz), SpO2 92.00%. She looks frail and chronically sick. Jugular venous pressure appears to be somewhat elevated at 3 cm. She did have edema in her arms which is pitting as well as her legs. Anteriorly, lung fields appear to be clear. Unfortunately she was unable to sit up for me so I could examine her posteriorly and feels. She appears to be alert and oriented.   Investigations:  Recent Results (from the past 240 hour(s))  URINE CULTURE     Status: None   Collection Time    10/13/13 11:19 AM      Result Value Ref Range Status   Specimen Description URINE, CATHETERIZED   Final   Special Requests NONE   Final   Culture  Setup Time     Final   Value: 10/13/2013 18:14     Performed at [place]     Final   Value: >=100,000 COLONIES/ML     Performed at [facility]   Culture     Final   Value: METHICILLIN RESISTANT STAPHYLOCOCCUS AUREUS     Note: RIFAMPIN AND GENTAMICIN SHOULD NOT BE USED AS SINGLE DRUGS FOR TREATMENT OF STAPH INFECTIONS.     Performed at [facility]   Report Status 10/15/2013 FINAL   Final   Organism ID, Bacteria METHICILLIN RESISTANT STAPHYLOCOCCUS AUREUS   Final  MRSA PCR SCREENING     Status: Abnormal   Collection Time    10/14/13  2:40 AM      Result Value Ref Range Status   MRSA by PCR POSITIVE (*) NEGATIVE Final   Comment:            The GeneXpert MRSA Assay (FDA     approved for NASAL specimens     only), is one component of a   comprehensive MRSA colonization     surveillance program. It is not     intended to diagnose MRSA     infection nor to guide or     monitor treatment for     MRSA infections.     RESULT CALLED TO, READ BACK BY AND VERIFIED WITH:     HORINE C AT 0351 ON [id number] BY FORSYTH K     Basic Metabolic Panel:  Recent Labs  10/14/13 0632 10/15/13 0536  NA 141 142  K 4.0 3.9  CL 100 101  CO2 26 25  GLUCOSE 120* 126*  BUN 67* 70*  CREATININE 3.61* 3.37*  CALCIUM 9.1 9.1   Liver Function Tests:  Recent Labs  10/13/13 1101  AST 10  ALT <5  ALKPHOS 86  BILITOT 0.4  PROT 7.2  ALBUMIN 3.1*     CBC:  Recent Labs  10/13/13 1101 10/14/13 0632  WBC 5.0 4.4  NEUTROABS 3.9  --   HGB 10.7* 10.3*  HCT 33.1* 32.1*  MCV 99.7 98.8  PLT 190 191    No results found.    Medications: I have reviewed the  patient's current medications.  Impression: 1. Acute on chronic diastolic congestive heart failure. Patient still remains fluid overloaded. 2. MRSA UTI. On intravenous vancomycin. 3. Possible pneumonia, I'm not entirely convinced of this. 4. Chronic atrial fibrillation not on anticoagulation secondary to history of GI bleed. 5. Diabetes. 6. MGUS, stable. 7. Hypertension. 8. Chronic renal failure, stable. 9. Hospice care.     Plan: 1. Increase Lasix to 80 mg IV every 8 hours in view of the chronic renal failure. 2. Continue with intravenous antibiotics.  Consultants:  None.   Procedures:  None.   Antibiotics:  Intravenous vancomycin and cefepime.                   Code Status: DO NOT RESUSCITATE.  Family Communication: I discussed the plan with the patient at the bedside.    Time spent: 15 minutes.   LOS: 3 days   [place] C   10/16/2013, 9:58 AM

## 2013-10-17 LAB — BASIC METABOLIC PANEL
BUN: 69 mg/dL — ABNORMAL HIGH (ref 6–23)
CO2: 26 meq/L (ref 19–32)
Calcium: 8.8 mg/dL (ref 8.4–10.5)
Chloride: 100 mEq/L (ref 96–112)
Creatinine, Ser: 3.18 mg/dL — ABNORMAL HIGH (ref 0.50–1.10)
GFR calc Af Amer: 14 mL/min — ABNORMAL LOW (ref >90)
GFR calc non Af Amer: 12 mL/min — ABNORMAL LOW (ref >90)
GLUCOSE: 135 mg/dL — AB (ref 70–99)
POTASSIUM: 3.6 meq/L — AB (ref 3.7–5.3)
Sodium: 141 mEq/L (ref 137–147)

## 2013-10-17 LAB — GLUCOSE, CAPILLARY
GLUCOSE-CAPILLARY: 157 mg/dL — AB (ref 70–99)
Glucose-Capillary: 163 mg/dL — ABNORMAL HIGH (ref 70–99)

## 2013-10-17 LAB — VANCOMYCIN, RANDOM: VANCOMYCIN RM: 10.5 ug/mL

## 2013-10-17 NOTE — Progress Notes (Signed)
Catherine [name] On WFU:[id number] DOB: 1923/11/01 DOA: 10/13/2013 PCP: Asencion Noble, MD   Subjective: This lady remains dyspneic even at rest. She remains edematous. Urine is growing MRSA. She is on vancomycin. There is a questionable pneumonia. I think most of her symptoms are related to congestive heart failure. Yesterday, I increased her Lasix to 80 mg IV every 8 hours.           Physical Exam: Blood pressure 131/72, pulse 78, temperature 97.7 F (36.5 C), temperature source Oral, resp. rate 20, height 5\' 3"  (1.6 m), weight 83 kg (182 lb 15.7 oz), SpO2 91.00%. She looks frail and chronically sick. Jugular venous pressure appears to be somewhat elevated at 3 cm. She did have edema in her arms which is pitting as well as her legs. Anteriorly, lung fields appear to be clear. Unfortunately she was unable to sit up for me so I could examine her posteriorly and feels. She appears to be alert and oriented.   Investigations:  Recent Results (from the past 240 hour(s))  URINE CULTURE     Status: None   Collection Time    10/13/13 11:19 AM      Result Value Ref Range Status   Specimen Description URINE, CATHETERIZED   Final   Special Requests NONE   Final   Culture  Setup Time     Final   Value: 10/13/2013 18:14     Performed at [place]     Final   Value: >=100,000 COLONIES/ML     Performed at [facility]   Culture     Final   Value: METHICILLIN RESISTANT STAPHYLOCOCCUS AUREUS     Note: RIFAMPIN AND GENTAMICIN SHOULD NOT BE USED AS SINGLE DRUGS FOR TREATMENT OF STAPH INFECTIONS.     Performed at [facility]   Report Status 10/15/2013 FINAL   Final   Organism ID, Bacteria METHICILLIN RESISTANT STAPHYLOCOCCUS AUREUS   Final  MRSA PCR SCREENING     Status: Abnormal   Collection Time    10/14/13  2:40 AM      Result Value Ref Range Status   MRSA by PCR POSITIVE (*) NEGATIVE Final   Comment:            The GeneXpert MRSA Assay (FDA     approved  for NASAL specimens     only), is one component of a     comprehensive MRSA colonization     surveillance program. It is not     intended to diagnose MRSA     infection nor to guide or     monitor treatment for     MRSA infections.     RESULT CALLED TO, READ BACK BY AND VERIFIED WITH:     HORINE C AT 0351 ON [id number] BY FORSYTH K     Basic Metabolic Panel:  Recent Labs  10/15/13 0536 10/17/13 0541  NA 142 141  K 3.9 3.6*  CL 101 100  CO2 25 26  GLUCOSE 126* 135*  BUN 70* 69*  CREATININE 3.37* 3.18*  CALCIUM 9.1 8.8   Liver Function Tests: No results found for this basename: AST, ALT, ALKPHOS, BILITOT, PROT, ALBUMIN,  in the last 72 hours   CBC: No results found for this basename: WBC, NEUTROABS, HGB, HCT, MCV, PLT,  in the last 72 hours  No results found.    Medications: I have reviewed the patient's current medications.  Impression: 1. Acute on chronic diastolic congestive  heart failure. Patient still remains fluid overloaded. 2. MRSA UTI. On intravenous vancomycin. 3. Possible pneumonia, I'm not entirely convinced of this. 4. Chronic atrial fibrillation not on anticoagulation secondary to history of GI bleed. 5. Diabetes. 6. MGUS, stable. 7. Hypertension. 8. Chronic renal failure, stable. 9. Hospice care.     Plan: 1. Continue Lasix to 80 mg IV every 8 hours in view of the chronic renal failure. Creatinine has improved. 2. Continue with intravenous antibiotics.  Consultants:  None.   Procedures:  None.   Antibiotics:  Intravenous vancomycin and cefepime.                   Code Status: DO NOT RESUSCITATE.  Family Communication: I discussed the plan with the patient at the bedside.    Time spent: 15 minutes.   LOS: 4 days   [place] C   10/17/2013, 8:25 AM

## 2013-10-18 LAB — BASIC METABOLIC PANEL
BUN: 69 mg/dL — ABNORMAL HIGH (ref 6–23)
CHLORIDE: 101 meq/L (ref 96–112)
CO2: 27 mEq/L (ref 19–32)
Calcium: 8.8 mg/dL (ref 8.4–10.5)
Creatinine, Ser: 3.09 mg/dL — ABNORMAL HIGH (ref 0.50–1.10)
GFR calc Af Amer: 15 mL/min — ABNORMAL LOW (ref >90)
GFR calc non Af Amer: 13 mL/min — ABNORMAL LOW (ref >90)
GLUCOSE: 133 mg/dL — AB (ref 70–99)
POTASSIUM: 3.4 meq/L — AB (ref 3.7–5.3)
SODIUM: 142 meq/L (ref 137–147)

## 2013-10-18 LAB — GLUCOSE, CAPILLARY
GLUCOSE-CAPILLARY: 104 mg/dL — AB (ref 70–99)
Glucose-Capillary: 105 mg/dL — ABNORMAL HIGH (ref 70–99)
Glucose-Capillary: 147 mg/dL — ABNORMAL HIGH (ref 70–99)

## 2013-10-18 MED ORDER — VANCOMYCIN HCL IN DEXTROSE 750-5 MG/150ML-% IV SOLN
750.0000 mg | Freq: Once | INTRAVENOUS | Status: AC
  Administered 2013-10-18: 750 mg via INTRAVENOUS
  Filled 2013-10-18: qty 150

## 2013-10-18 MED ORDER — POTASSIUM CHLORIDE CRYS ER 20 MEQ PO TBCR
20.0000 meq | EXTENDED_RELEASE_TABLET | Freq: Three times a day (TID) | ORAL | Status: DC
  Administered 2013-10-18 – 2013-10-21 (×10): 20 meq via ORAL
  Filled 2013-10-18 (×11): qty 1

## 2013-10-18 MED ORDER — VANCOMYCIN HCL 10 G IV SOLR
1250.0000 mg | INTRAVENOUS | Status: DC
  Administered 2013-10-19: 1250 mg via INTRAVENOUS
  Filled 2013-10-18 (×2): qty 1250

## 2013-10-18 NOTE — Progress Notes (Signed)
UR chart review completed.  

## 2013-10-18 NOTE — Progress Notes (Signed)
Subjective: She continues to experience significant edema especially in the upper extremities. She has had no fever. Vital signs and oxygen saturation have remained stable.  Objective: Vital signs in last 24 hours: Filed Vitals:   10/17/13 1422 10/17/13 1957 10/17/13 2240 10/18/13 0659  BP: 119/73  127/76 112/70  Pulse: 81 83 98 94  Temp: 97.7 F (36.5 C)   97 F (36.1 C)  TempSrc: Oral   Oral  Resp: 18 18  20   Height:      Weight:    182 lb 8.7 oz (82.8 kg)  SpO2: 95% 91%  95%   Weight change:   Intake/Output Summary (Last 24 hours) at 10/18/13 0735 Last data filed at 10/18/13 0703  Gross per 24 hour  Intake   1860 ml  Output   2100 ml  Net   -240 ml    Physical Exam: No distress. Alert and oriented. Lungs reveal basilar rales. Heart irregular with a grade 2 systolic murmur. Abdomen nontender. Extremities reveal persistent edema but less in the lower extremities. The right hand is more swollen.  Lab Results:    Results for orders placed during the hospital encounter of 10/13/13 (from the past 24 hour(s))  VANCOMYCIN, RANDOM     Status: None   Collection Time    10/17/13  8:10 PM      Result Value Ref Range   Vancomycin Rm 10.5    GLUCOSE, CAPILLARY     Status: Abnormal   Collection Time    10/17/13 10:44 PM      Result Value Ref Range   Glucose-Capillary 157 (*) 70 - 99 mg/dL   Comment 1 Documented in Chart     Comment 2 Notify RN    BASIC METABOLIC PANEL     Status: Abnormal   Collection Time    10/18/13  5:18 AM      Result Value Ref Range   Sodium 142  137 - 147 mEq/L   Potassium 3.4 (*) 3.7 - 5.3 mEq/L   Chloride 101  96 - 112 mEq/L   CO2 27  19 - 32 mEq/L   Glucose, Bld 133 (*) 70 - 99 mg/dL   BUN 69 (*) 6 - 23 mg/dL   Creatinine, Ser 3.09 (*) 0.50 - 1.10 mg/dL   Calcium 8.8  8.4 - 10.5 mg/dL   GFR calc non Af Amer 13 (*) >90 mL/min   GFR calc Af Amer 15 (*) >90 mL/min     ABGS No results found for this basename: PHART, PCO2, PO2ART, TCO2, HCO3,   in the last 72 hours CULTURES Recent Results (from the past 240 hour(s))  URINE CULTURE     Status: None   Collection Time    10/13/13 11:19 AM      Result Value Ref Range Status   Specimen Description URINE, CATHETERIZED   Final   Special Requests NONE   Final   Culture  Setup Time     Final   Value: 10/13/2013 18:14     Performed at [place]     Final   Value: >=100,000 COLONIES/ML     Performed at [facility]   Culture     Final   Value: METHICILLIN RESISTANT STAPHYLOCOCCUS AUREUS     Note: RIFAMPIN AND GENTAMICIN SHOULD NOT BE USED AS SINGLE DRUGS FOR TREATMENT OF STAPH INFECTIONS.     Performed at [facility]   Report Status 10/15/2013 FINAL  Final   Organism ID, Bacteria METHICILLIN RESISTANT STAPHYLOCOCCUS AUREUS   Final  MRSA PCR SCREENING     Status: Abnormal   Collection Time    10/14/13  2:40 AM      Result Value Ref Range Status   MRSA by PCR POSITIVE (*) NEGATIVE Final   Comment:            The GeneXpert MRSA Assay (FDA     approved for NASAL specimens     only), is one component of a     comprehensive MRSA colonization     surveillance program. It is not     intended to diagnose MRSA     infection nor to guide or     monitor treatment for     MRSA infections.     RESULT CALLED TO, READ BACK BY AND VERIFIED WITH:     HORINE C AT 0351 ON [id number] BY FORSYTH K   Studies/Results: No results found. Micro Results: Recent Results (from the past 240 hour(s))  URINE CULTURE     Status: None   Collection Time    10/13/13 11:19 AM      Result Value Ref Range Status   Specimen Description URINE, CATHETERIZED   Final   Special Requests NONE   Final   Culture  Setup Time     Final   Value: 10/13/2013 18:14     Performed at [place]     Final   Value: >=100,000 COLONIES/ML     Performed at [facility]   Culture     Final   Value: METHICILLIN RESISTANT STAPHYLOCOCCUS AUREUS     Note:  RIFAMPIN AND GENTAMICIN SHOULD NOT BE USED AS SINGLE DRUGS FOR TREATMENT OF STAPH INFECTIONS.     Performed at [facility]   Report Status 10/15/2013 FINAL   Final   Organism ID, Bacteria METHICILLIN RESISTANT STAPHYLOCOCCUS AUREUS   Final  MRSA PCR SCREENING     Status: Abnormal   Collection Time    10/14/13  2:40 AM      Result Value Ref Range Status   MRSA by PCR POSITIVE (*) NEGATIVE Final   Comment:            The GeneXpert MRSA Assay (FDA     approved for NASAL specimens     only), is one component of a     comprehensive MRSA colonization     surveillance program. It is not     intended to diagnose MRSA     infection nor to guide or     monitor treatment for     MRSA infections.     RESULT CALLED TO, READ BACK BY AND VERIFIED WITH:     HORINE C AT 0351 ON [id number] BY FORSYTH K   Studies/Results: No results found. Medications:  I have reviewed the patient's current medications Scheduled Meds: . ceFEPime (MAXIPIME) IV  1 g Intravenous Q24H  . citalopram  20 mg Oral Daily  . diltiazem  60 mg Oral Q12H  . enoxaparin (LOVENOX) injection  30 mg Subcutaneous Q24H  . ferrous sulfate  325 mg Oral Q breakfast  . fluocinonide cream  1 application Topical Daily  . furosemide  80 mg Intravenous Q8H  . insulin glargine  6 Units Subcutaneous QHS  . metoprolol tartrate  25 mg Oral BID  . mupirocin ointment  1 application Nasal BID  . pantoprazole  40 mg Oral Daily  .  potassium chloride  20 mEq Oral TID  . sodium chloride  3 mL Intravenous Q12H  . sodium chloride  3 mL Intravenous Q12H  . triamcinolone cream  1 application Topical Daily  . vancomycin  1,000 mg Intravenous Q48H   Continuous Infusions: . sodium chloride 10 mL/hr at 10/13/13 1122   PRN Meds:.sodium chloride, acetaminophen, acetaminophen, clonazePAM, morphine, ondansetron (ZOFRAN) IV, ondansetron, polyethylene glycol, sodium chloride   Assessment/Plan: #1. Acute on chronic diastolic heart failure. Lasix was  increased to 80 mg IV every 8 hours over the weekend. #2. Hypokalemia. Serum potassium has dropped to 3.4. Will increase her oral supplementation. #3. Staph UTI. Continue vancomycin. #4. Possible pneumonia. #5. Diabetes. Stable on Lantus. #6. Chronic kidney disease. Creatinine is stable at 3. Active Problems:   Anemia of other chronic disease   Hypertension   Chronic kidney disease (CKD) stage G4/A1, severely decreased glomerular filtration rate (GFR) between 15-29 mL/min/1.73 square meter and albuminuria creatinine ratio less than 30 mg/g   Chronic respiratory failure   Diabetes   Diastolic CHF, acute on chronic   UTI (urinary tract infection)   HCAP (healthcare-associated pneumonia)   Acute on chronic diastolic CHF (congestive heart failure)   Pneumonia     LOS: 5 days   Catherine [name],Catherine [name] 10/18/2013, 7:35 AM

## 2013-10-18 NOTE — Progress Notes (Signed)
ANTIBIOTIC CONSULT NOTE - follow up  Pharmacy Consult for Vancomycin & Cefepime Indication: pneumonia  Allergies  Allergen Reactions  . Amoxicillin-Pot Clavulanate Diarrhea   Patient Measurements: Height: 5\' 3"  (160 cm) Weight: 182 lb 8.7 oz (82.8 kg) IBW/kg (Calculated) : 52.4  Vital Signs: Temp: 97 F (36.1 C) (06/22 0659) Temp src: Oral (06/22 0659) BP: 112/70 mmHg (06/22 0659) Pulse Rate: 94 (06/22 0659) Intake/Output from previous day: 06/21 0701 - 06/22 0700 In: 960 [P.O.:960] Out: 1100 [Urine:1100] Intake/Output from this shift: Total I/O In: 900 [P.O.:900] Out: 1000 [Urine:1000]  Labs:  Recent Labs  10/17/13 0541 10/18/13 0518  CREATININE 3.18* 3.09*   Estimated Creatinine Clearance: 13.1 ml/min (by C-G formula based on Cr of 3.09).  Recent Labs  10/17/13 2010  VANCORANDOM 10.5    Microbiology: Recent Results (from the past 720 hour(s))  URINE CULTURE     Status: None   Collection Time    10/13/13 11:19 AM      Result Value Ref Range Status   Specimen Description URINE, CATHETERIZED   Final   Special Requests NONE   Final   Culture  Setup Time     Final   Value: 10/13/2013 18:14     Performed at [facility]     Final   Value: >=100,000 COLONIES/ML     Performed at [facility]   Culture     Final   Value: METHICILLIN RESISTANT STAPHYLOCOCCUS AUREUS     Note: RIFAMPIN AND GENTAMICIN SHOULD NOT BE USED AS SINGLE DRUGS FOR TREATMENT OF STAPH INFECTIONS.     Performed at [facility]   Report Status 10/15/2013 FINAL   Final   Organism ID, Bacteria METHICILLIN RESISTANT STAPHYLOCOCCUS AUREUS   Final  MRSA PCR SCREENING     Status: Abnormal   Collection Time    10/14/13  2:40 AM      Result Value Ref Range Status   MRSA by PCR POSITIVE (*) NEGATIVE Final   Comment:            The GeneXpert MRSA Assay (FDA     approved for NASAL specimens     only), is one component of a     comprehensive MRSA  colonization     surveillance program. It is not     intended to diagnose MRSA     infection nor to guide or     monitor treatment for     MRSA infections.     RESULT CALLED TO, READ BACK BY AND VERIFIED WITH:     HORINE C AT [place] ON [id number] BY FORSYTH K   Medical History: Past Medical History  Diagnosis Date  . Diastolic heart failure     LVEF 60%  . Type 2 diabetes mellitus   . Essential hypertension, benign   . COPD (chronic obstructive pulmonary disease)     Home O2  . Vertigo   . Breast cancer   . Stroke      Patient denies  . Skin disorder     Epidermolysis bullosa.  . Pulmonary hypertension   . Multiple thyroid nodules   . Anemia   . Aortic stenosis   . Monoclonal gammopathy   . Diverticulosis   . Squamous cell carcinoma   . Osteoporosis   . CKD (chronic kidney disease) stage 4, GFR 15-29 ml/min   . Atrial fibrillation   . Mitral regurgitation     Moderate with severe left atrial  enlargement   Medications:  Scheduled:  . ceFEPime (MAXIPIME) IV  1 g Intravenous Q24H  . citalopram  20 mg Oral Daily  . diltiazem  60 mg Oral Q12H  . enoxaparin (LOVENOX) injection  30 mg Subcutaneous Q24H  . ferrous sulfate  325 mg Oral Q breakfast  . fluocinonide cream  1 application Topical Daily  . furosemide  80 mg Intravenous Q8H  . insulin glargine  6 Units Subcutaneous QHS  . metoprolol tartrate  25 mg Oral BID  . mupirocin ointment  1 application Nasal BID  . pantoprazole  40 mg Oral Daily  . potassium chloride  20 mEq Oral TID  . sodium chloride  3 mL Intravenous Q12H  . sodium chloride  3 mL Intravenous Q12H  . triamcinolone cream  1 application Topical Daily  . [START ON 10/19/2013] vancomycin  1,250 mg Intravenous Q48H   Assessment: 80 yo F with end stage HF & CKD who is being followed by Hospice presented to ED today from MD office for hypotension & SOB.  CXR + for developing PNA.  UA +infection.  She lives in a nursing facility & is therefore being started on  empiric, broad-spectrum antibiotics.  She is afebrile with normal WBC.   Trough level is slightly below desired goal.  She has Stage IV-V CKD.  Estimated CrCl ~10-57ml/min which is patient's baseline.  Urine Culture for MRSA noted.  Culture -        Result:        METHICILLIN RESISTANT STAPHYLOCOCCUS AUREUS Note: RIFAMPIN AND GENTAMICIN SHOULD NOT BE USED AS SINGLE DRUGS FOR TREATMENT OF STAPH INFECTIONS. Performed at [facility]      Report Status 10/15/2013 FINAL        Organism ID, Bacteria METHICILLIN RESISTANT STAPHYLOCOCCUS AUREUS      Culture & Susceptibility    METHICILLIN RESISTANT STAPHYLOCOCCUS AUREUS    Antibiotic Sensitivity Microscan Status    GENTAMICIN Intermediate 8 INTERMEDIATE Final    Method: MIC    LEVOFLOXACIN Resistant >=8 RESISTANT Final    Method: MIC    NITROFURANTOIN Sensitive <=16 SENSITIVE Final    Method: MIC    OXACILLIN Resistant >=4 RESISTANT Final    Method: MIC    PENICILLIN Resistant >=0.5 RESISTANT Final    Method: MIC    RIFAMPIN Sensitive <=0.5 SENSITIVE Final    Method: MIC    TETRACYCLINE Resistant >=16 RESISTANT Final    Method: MIC    TRIMETH/SULFA Sensitive 20 SENSITIVE Final    Method: MIC    VANCOMYCIN Sensitive 1 SENSITIVE Final    Method: MIC    Comments METHICILLIN RESISTANT STAPHYLOCOCCUS AUREUS (MIC)    METHICILLIN RESISTANT STAPHYLOCOCCUS AUREUS      Vancomycin 6/17>> Cefepime 6/17>>   Goal of Therapy:  Vancomycin trough level 15-20 mcg/ml  Plan:   Cefepime 1gm IV q24h  Vancomycin 750mg  today x 1 (additional dose to boost level)  Increase Vancomycin to 1250mg   IV q48h (target trough ~ 15)  Check Vancomycin trough weekly or sooner if indicated  Duration of therapy per MD  Monitor renal function and cx data   Deescalate antibiotics when improved / appropriate  [place] Crane, Ralphie Lovelady A 10/18/2013,7:41 AM

## 2013-10-19 LAB — CBC
HCT: 31.6 % — ABNORMAL LOW (ref 36.0–46.0)
Hemoglobin: 10.2 g/dL — ABNORMAL LOW (ref 12.0–15.0)
MCH: 31.7 pg (ref 26.0–34.0)
MCHC: 32.3 g/dL (ref 30.0–36.0)
MCV: 98.1 fL (ref 78.0–100.0)
PLATELETS: 191 10*3/uL (ref 150–400)
RBC: 3.22 MIL/uL — ABNORMAL LOW (ref 3.87–5.11)
RDW: 14.6 % (ref 11.5–15.5)
WBC: 4.2 10*3/uL (ref 4.0–10.5)

## 2013-10-19 LAB — GLUCOSE, CAPILLARY
GLUCOSE-CAPILLARY: 114 mg/dL — AB (ref 70–99)
Glucose-Capillary: 148 mg/dL — ABNORMAL HIGH (ref 70–99)
Glucose-Capillary: 131 mg/dL — ABNORMAL HIGH (ref 70–99)

## 2013-10-19 NOTE — Clinical Social Work Note (Signed)
CSW updated Catherine [name] at [facility] on pt. Pt still has significant edema. Facility continues to be willing to accept pt at d/c.   [name], Nellie

## 2013-10-19 NOTE — Progress Notes (Signed)
Subjective: She continues to diurese but continues to have significant edema especially in the upper extremities. She has had no fever. She has no significant cough or sputum production.  Objective: Vital signs in last 24 hours: Filed Vitals:   10/18/13 1553 10/18/13 2016 10/19/13 0435 10/19/13 0500  BP: 109/56 126/72 125/68   Pulse: 70 94 65   Temp: 97.5 F (36.4 C) 97.6 F (36.4 C) 97.6 F (36.4 C)   TempSrc: Oral Oral Oral   Resp: 20 20 20    Height:      Weight:    182 lb 1.6 oz (82.6 kg)  SpO2: 98% 96% 94%    Weight change: -7.1 oz (-0.2 kg)  Intake/Output Summary (Last 24 hours) at 10/19/13 0738 Last data filed at 10/19/13 0436  Gross per 24 hour  Intake    360 ml  Output   1600 ml  Net  -1240 ml    Physical Exam: Alert and oriented. Lungs reveal minimal rales. Heart irregular with a grade 2-3 systolic murmur. Abdomen is nontender. Extremities continue to show significant edema.  Lab Results:    Results for orders placed during the hospital encounter of 10/13/13 (from the past 24 hour(s))  GLUCOSE, CAPILLARY     Status: Abnormal   Collection Time    10/18/13  7:48 AM      Result Value Ref Range   Glucose-Capillary 105 (*) 70 - 99 mg/dL  GLUCOSE, CAPILLARY     Status: Abnormal   Collection Time    10/18/13 11:22 AM      Result Value Ref Range   Glucose-Capillary 104 (*) 70 - 99 mg/dL  GLUCOSE, CAPILLARY     Status: Abnormal   Collection Time    10/18/13  8:14 PM      Result Value Ref Range   Glucose-Capillary 147 (*) 70 - 99 mg/dL  CBC     Status: Abnormal   Collection Time    10/19/13  6:15 AM      Result Value Ref Range   WBC 4.2  4.0 - 10.5 K/uL   RBC 3.22 (*) 3.87 - 5.11 MIL/uL   Hemoglobin 10.2 (*) 12.0 - 15.0 g/dL   HCT 31.6 (*) 36.0 - 46.0 %   MCV 98.1  78.0 - 100.0 fL   MCH 31.7  26.0 - 34.0 pg   MCHC 32.3  30.0 - 36.0 g/dL   RDW 14.6  11.5 - 15.5 %   Platelets 191  150 - 400 K/uL  GLUCOSE, CAPILLARY     Status: Abnormal   Collection Time     10/19/13  7:16 AM      Result Value Ref Range   Glucose-Capillary 114 (*) 70 - 99 mg/dL   Comment 1 Documented in Chart     Comment 2 Notify RN       ABGS No results found for this basename: PHART, PCO2, PO2ART, TCO2, HCO3,  in the last 72 hours CULTURES Recent Results (from the past 240 hour(s))  URINE CULTURE     Status: None   Collection Time    10/13/13 11:19 AM      Result Value Ref Range Status   Specimen Description URINE, CATHETERIZED   Final   Special Requests NONE   Final   Culture  Setup Time     Final   Value: 10/13/2013 18:14     Performed at [place]     Final   Value: >=100,000 COLONIES/ML  Performed at [facility]     Final   Value: METHICILLIN RESISTANT STAPHYLOCOCCUS AUREUS     Note: RIFAMPIN AND GENTAMICIN SHOULD NOT BE USED AS SINGLE DRUGS FOR TREATMENT OF STAPH INFECTIONS.     Performed at [facility]   Report Status 10/15/2013 FINAL   Final   Organism ID, Bacteria METHICILLIN RESISTANT STAPHYLOCOCCUS AUREUS   Final  MRSA PCR SCREENING     Status: Abnormal   Collection Time    10/14/13  2:40 AM      Result Value Ref Range Status   MRSA by PCR POSITIVE (*) NEGATIVE Final   Comment:            The GeneXpert MRSA Assay (FDA     approved for NASAL specimens     only), is one component of a     comprehensive MRSA colonization     surveillance program. It is not     intended to diagnose MRSA     infection nor to guide or     monitor treatment for     MRSA infections.     RESULT CALLED TO, READ BACK BY AND VERIFIED WITH:     HORINE C AT 0351 ON [id number] BY FORSYTH K   Studies/Results: No results found. Micro Results: Recent Results (from the past 240 hour(s))  URINE CULTURE     Status: None   Collection Time    10/13/13 11:19 AM      Result Value Ref Range Status   Specimen Description URINE, CATHETERIZED   Final   Special Requests NONE   Final   Culture  Setup Time     Final   Value:  10/13/2013 18:14     Performed at [facility]     Final   Value: >=100,000 COLONIES/ML     Performed at [facility]   Culture     Final   Value: METHICILLIN RESISTANT STAPHYLOCOCCUS AUREUS     Note: RIFAMPIN AND GENTAMICIN SHOULD NOT BE USED AS SINGLE DRUGS FOR TREATMENT OF STAPH INFECTIONS.     Performed at [facility]   Report Status 10/15/2013 FINAL   Final   Organism ID, Bacteria METHICILLIN RESISTANT STAPHYLOCOCCUS AUREUS   Final  MRSA PCR SCREENING     Status: Abnormal   Collection Time    10/14/13  2:40 AM      Result Value Ref Range Status   MRSA by PCR POSITIVE (*) NEGATIVE Final   Comment:            The GeneXpert MRSA Assay (FDA     approved for NASAL specimens     only), is one component of a     comprehensive MRSA colonization     surveillance program. It is not     intended to diagnose MRSA     infection nor to guide or     monitor treatment for     MRSA infections.     RESULT CALLED TO, READ BACK BY AND VERIFIED WITH:     HORINE C AT 0351 ON [id number] BY FORSYTH K   Studies/Results: No results found. Medications:  I have reviewed the patient's current medications Scheduled Meds: . ceFEPime (MAXIPIME) IV  1 g Intravenous Q24H  . citalopram  20 mg Oral Daily  . diltiazem  60 mg Oral Q12H  . enoxaparin (LOVENOX) injection  30 mg Subcutaneous Q24H  . ferrous sulfate  325 mg Oral Q breakfast  . fluocinonide cream  1 application Topical Daily  . furosemide  80 mg Intravenous Q8H  . insulin glargine  6 Units Subcutaneous QHS  . metoprolol tartrate  25 mg Oral BID  . pantoprazole  40 mg Oral Daily  . potassium chloride  20 mEq Oral TID  . sodium chloride  3 mL Intravenous Q12H  . sodium chloride  3 mL Intravenous Q12H  . triamcinolone cream  1 application Topical Daily  . vancomycin  1,250 mg Intravenous Q48H   Continuous Infusions: . sodium chloride 10 mL/hr at 10/13/13 1122   PRN Meds:.sodium chloride, acetaminophen,  acetaminophen, clonazePAM, morphine, ondansetron (ZOFRAN) IV, ondansetron, polyethylene glycol, sodium chloride   Assessment/Plan: #1. Acute on chronic diastolic heart failure. Continue intravenous Lasix. #2. Staph UTI. Continue vancomycin. White count is normal. #3. Possible pneumonia. Continue cefepime for a seven-day course. #4. MGUS. Hemoglobin stable at 10.2. #5. Diabetes. Continue low-dose Lantus. #6. Pulmonary hypertension. #7. Chronic kidney disease. Repeat metabolic profile tomorrow. Active Problems:   Anemia of other chronic disease   Hypertension   Chronic kidney disease (CKD) stage G4/A1, severely decreased glomerular filtration rate (GFR) between 15-29 mL/min/1.73 square meter and albuminuria creatinine ratio less than 30 mg/g   Chronic respiratory failure   Diabetes   Diastolic CHF, acute on chronic   UTI (urinary tract infection)   HCAP (healthcare-associated pneumonia)   Acute on chronic diastolic CHF (congestive heart failure)   Pneumonia     LOS: 6 days   FAGAN,ROY 10/19/2013, 7:38 AM

## 2013-10-20 LAB — BASIC METABOLIC PANEL
BUN: 70 mg/dL — ABNORMAL HIGH (ref 6–23)
CALCIUM: 8.8 mg/dL (ref 8.4–10.5)
CHLORIDE: 101 meq/L (ref 96–112)
CO2: 29 meq/L (ref 19–32)
CREATININE: 3.12 mg/dL — AB (ref 0.50–1.10)
GFR calc non Af Amer: 12 mL/min — ABNORMAL LOW (ref >90)
GFR, EST AFRICAN AMERICAN: 14 mL/min — AB (ref >90)
Glucose, Bld: 131 mg/dL — ABNORMAL HIGH (ref 70–99)
Potassium: 4.1 mEq/L (ref 3.7–5.3)
SODIUM: 142 meq/L (ref 137–147)

## 2013-10-20 LAB — GLUCOSE, CAPILLARY
GLUCOSE-CAPILLARY: 145 mg/dL — AB (ref 70–99)
GLUCOSE-CAPILLARY: 174 mg/dL — AB (ref 70–99)
Glucose-Capillary: 172 mg/dL — ABNORMAL HIGH (ref 70–99)
Glucose-Capillary: 105 mg/dL — ABNORMAL HIGH (ref 70–99)
Glucose-Capillary: 112 mg/dL — ABNORMAL HIGH (ref 70–99)

## 2013-10-20 NOTE — Progress Notes (Signed)
Subjective: She notes improvement in her swelling. She has continued to diurese. Foley catheter is in place. Respiratory status has been stable on supplemental oxygen.  Objective: Vital signs in last 24 hours: Filed Vitals:   10/19/13 1434 10/19/13 2121 10/19/13 2321 10/20/13 0519  BP: 91/45 116/62  108/75  Pulse: 63 99  80  Temp: 97.7 F (36.5 C) 97.4 F (36.3 C)  97.2 F (36.2 C)  TempSrc: Oral Oral  Oral  Resp: 20 20  20   Height:      Weight:    181 lb (82.1 kg)  SpO2: 95% 99% 99% 100%   Weight change: -1 lb 1.6 oz (-0.5 kg)  Intake/Output Summary (Last 24 hours) at 10/20/13 0735 Last data filed at 10/20/13 0523  Gross per 24 hour  Intake    363 ml  Output   1725 ml  Net  -1362 ml    Physical Exam: Alert and oriented. No distress. Lungs reveal minimal rales. Heart irregular with a grade 2-3 systolic murmur. Extremities reveal improved edema most notably in the right hand and arm.  Lab Results:    Results for orders placed during the hospital encounter of 10/13/13 (from the past 24 hour(s))  GLUCOSE, CAPILLARY     Status: Abnormal   Collection Time    10/19/13 11:20 AM      Result Value Ref Range   Glucose-Capillary 148 (*) 70 - 99 mg/dL   Comment 1 Documented in Chart     Comment 2 Notify RN    GLUCOSE, CAPILLARY     Status: Abnormal   Collection Time    10/19/13  4:09 PM      Result Value Ref Range   Glucose-Capillary 131 (*) 70 - 99 mg/dL   Comment 1 Documented in Chart     Comment 2 Notify RN    GLUCOSE, CAPILLARY     Status: Abnormal   Collection Time    10/19/13  9:21 PM      Result Value Ref Range   Glucose-Capillary 172 (*) 70 - 99 mg/dL   Comment 1 Notify RN    BASIC METABOLIC PANEL     Status: Abnormal   Collection Time    10/20/13  6:20 AM      Result Value Ref Range   Sodium 142  137 - 147 mEq/L   Potassium 4.1  3.7 - 5.3 mEq/L   Chloride 101  96 - 112 mEq/L   CO2 29  19 - 32 mEq/L   Glucose, Bld 131 (*) 70 - 99 mg/dL   BUN 70 (*) 6 - 23  mg/dL   Creatinine, Ser 3.12 (*) 0.50 - 1.10 mg/dL   Calcium 8.8  8.4 - 10.5 mg/dL   GFR calc non Af Amer 12 (*) >90 mL/min   GFR calc Af Amer 14 (*) >90 mL/min     ABGS No results found for this basename: PHART, PCO2, PO2ART, TCO2, HCO3,  in the last 72 hours CULTURES Recent Results (from the past 240 hour(s))  URINE CULTURE     Status: None   Collection Time    10/13/13 11:19 AM      Result Value Ref Range Status   Specimen Description URINE, CATHETERIZED   Final   Special Requests NONE   Final   Culture  Setup Time     Final   Value: 10/13/2013 18:14     Performed at [facility]     Final  Value: >=100,000 COLONIES/ML     Performed at [facility]   Culture     Final   Value: METHICILLIN RESISTANT STAPHYLOCOCCUS AUREUS     Note: RIFAMPIN AND GENTAMICIN SHOULD NOT BE USED AS SINGLE DRUGS FOR TREATMENT OF STAPH INFECTIONS.     Performed at [facility]   Report Status 10/15/2013 FINAL   Final   Organism ID, Bacteria METHICILLIN RESISTANT STAPHYLOCOCCUS AUREUS   Final  MRSA PCR SCREENING     Status: Abnormal   Collection Time    10/14/13  2:40 AM      Result Value Ref Range Status   MRSA by PCR POSITIVE (*) NEGATIVE Final   Comment:            The GeneXpert MRSA Assay (FDA     approved for NASAL specimens     only), is one component of a     comprehensive MRSA colonization     surveillance program. It is not     intended to diagnose MRSA     infection nor to guide or     monitor treatment for     MRSA infections.     RESULT CALLED TO, READ BACK BY AND VERIFIED WITH:     HORINE C AT 0351 ON [id number] BY FORSYTH K   Studies/Results: No results found. Micro Results: Recent Results (from the past 240 hour(s))  URINE CULTURE     Status: None   Collection Time    10/13/13 11:19 AM      Result Value Ref Range Status   Specimen Description URINE, CATHETERIZED   Final   Special Requests NONE   Final   Culture  Setup Time     Final    Value: 10/13/2013 18:14     Performed at [place]     Final   Value: >=100,000 COLONIES/ML     Performed at [facility]   Culture     Final   Value: METHICILLIN RESISTANT STAPHYLOCOCCUS AUREUS     Note: RIFAMPIN AND GENTAMICIN SHOULD NOT BE USED AS SINGLE DRUGS FOR TREATMENT OF STAPH INFECTIONS.     Performed at [facility]   Report Status 10/15/2013 FINAL   Final   Organism ID, Bacteria METHICILLIN RESISTANT STAPHYLOCOCCUS AUREUS   Final  MRSA PCR SCREENING     Status: Abnormal   Collection Time    10/14/13  2:40 AM      Result Value Ref Range Status   MRSA by PCR POSITIVE (*) NEGATIVE Final   Comment:            The GeneXpert MRSA Assay (FDA     approved for NASAL specimens     only), is one component of a     comprehensive MRSA colonization     surveillance program. It is not     intended to diagnose MRSA     infection nor to guide or     monitor treatment for     MRSA infections.     RESULT CALLED TO, READ BACK BY AND VERIFIED WITH:     HORINE C AT 0351 ON [id number] BY FORSYTH K   Studies/Results: No results found. Medications:  I have reviewed the patient's current medications Scheduled Meds: . ceFEPime (MAXIPIME) IV  1 g Intravenous Q24H  . citalopram  20 mg Oral Daily  . diltiazem  60 mg Oral Q12H  . enoxaparin (LOVENOX) injection  30 mg  Subcutaneous Q24H  . ferrous sulfate  325 mg Oral Q breakfast  . fluocinonide cream  1 application Topical Daily  . furosemide  80 mg Intravenous Q8H  . insulin glargine  6 Units Subcutaneous QHS  . metoprolol tartrate  25 mg Oral BID  . pantoprazole  40 mg Oral Daily  . potassium chloride  20 mEq Oral TID  . sodium chloride  3 mL Intravenous Q12H  . sodium chloride  3 mL Intravenous Q12H  . triamcinolone cream  1 application Topical Daily  . vancomycin  1,250 mg Intravenous Q48H   Continuous Infusions: . sodium chloride 10 mL/hr at 10/13/13 1122   PRN Meds:.sodium chloride,  acetaminophen, acetaminophen, clonazePAM, morphine, ondansetron (ZOFRAN) IV, ondansetron, polyethylene glycol, sodium chloride   Assessment/Plan: #1. Acute on chronic diastolic heart failure. Continue IV Lasix. #2. Chronic kidney disease. BUN and creatinine are 70 and 3.1. Electrolytes are normal. Potassium is up to 4.1. #3. Staph UTI. Continue vancomycin. #4. Questionable pneumonia. Complete cefepime course. #5. Diabetes. Fasting glucose 131. Active Problems:   Anemia of other chronic disease   Hypertension   Chronic kidney disease (CKD) stage G4/A1, severely decreased glomerular filtration rate (GFR) between 15-29 mL/min/1.73 square meter and albuminuria creatinine ratio less than 30 mg/g   Chronic respiratory failure   Diabetes   Diastolic CHF, acute on chronic   UTI (urinary tract infection)   HCAP (healthcare-associated pneumonia)   Acute on chronic diastolic CHF (congestive heart failure)   Pneumonia     LOS: 7 days   FAGAN,ROY 10/20/2013, 7:35 AM

## 2013-10-20 NOTE — Progress Notes (Signed)
10/20/13 1042 assisted up to chair with nurse tech assist. Chair alarm on for safety. Call light and phone within reach, verbalizes understanding to call for assist and not get up on her own. States comfortable. Right arm swelling improved, arm elevated on pillow for comfort. Denies pain at this time. Donavan Foil, RN

## 2013-10-20 NOTE — Clinical Social Work Note (Signed)
CSW updated Baker Janus at [facility] on patient, facility continues to be willing to accept patient at discharge.  Edwyna Shell, LCSW Clinical Social Worker [phone])

## 2013-10-20 NOTE — Progress Notes (Signed)
10/20/13 1041 Patient had large formed stool, green/brown color. Slight amount bright red blood noted. Pt has hemorrhoids on assessment and was straining for stool per nurse tech report. Janeece Fitting, RN

## 2013-10-20 NOTE — Progress Notes (Signed)
Nutrition Brief Note  Patient identified due to length of stay. She has hx of diastolic CHF, CKD. GI bleed. Multiple admissions past 6 months. Presents with acute on chronic CHF and staph UTI. She is follow by Hospice.  Wt Readings from Last 15 Encounters:  10/20/13 181 lb (82.1 kg)  08/30/13 171 lb 11.8 oz (77.9 kg)  08/19/13 166 lb (75.297 kg)  08/13/13 172 lb 9.6 oz (78.291 kg)  08/02/13 164 lb 0.4 oz (74.4 kg)  06/26/13 158 lb 1.1 oz (71.7 kg)  06/10/13 165 lb (74.844 kg)  05/18/13 147 lb (66.679 kg)  04/20/13 148 lb (67.132 kg)  03/22/13 147 lb 4 oz (66.792 kg)  02/24/13 153 lb (69.4 kg)  02/02/13 150 lb 1.6 oz (68.085 kg)  01/12/13 136 lb 4.8 oz (61.825 kg)  01/08/13 139 lb (63.05 kg)  12/05/12 139 lb 8.8 oz (63.3 kg)    Body mass index is 32.07 kg/(m^2). Patient meets criteria for obesity class 1 based on current BMI. Her weight has continued to increase past 2-3 months as noted above. Increase of 5#,3%in 11 days April 23-May 4th and significant weight gain of 15#,9% since last assessed by RD during April admission.  Her appetite is reported as good.Current diet order is Heart Healthy/CHO modeified, patient is consuming approximately 50-75% of meals at this time. Labs and medications reviewed.   No nutrition interventions warranted at this time. If nutrition issues arise, please consult RD.   Colman Cater MS,RD,CSG,LDN Office: [phone] Pager: [phone]

## 2013-10-21 LAB — GLUCOSE, CAPILLARY
Glucose-Capillary: 116 mg/dL — ABNORMAL HIGH (ref 70–99)
Glucose-Capillary: 116 mg/dL — ABNORMAL HIGH (ref 70–99)

## 2013-10-21 NOTE — Discharge Summary (Signed)
Physician Discharge Summary  Catherine [name] KJZ:[id number] DOB: Dec 19, 1923 DOA: 10/13/2013   Admit date: 10/13/2013 Discharge date: 10/21/2013  Discharge Diagnoses:  Active Problems:   Anemia of other chronic disease   Hypertension   Chronic kidney disease (CKD) stage G4/A1, severely decreased glomerular filtration rate (GFR) between 15-29 mL/min/1.73 square meter and albuminuria creatinine ratio less than 30 mg/g   Chronic respiratory failure   Diabetes   Diastolic CHF, acute on chronic   UTI (urinary tract infection)   HCAP (healthcare-associated pneumonia)   Acute on chronic diastolic CHF (congestive heart failure)   Pneumonia    Wt Readings from Last 3 Encounters:  10/21/13 182 lb 5.1 oz (82.7 kg)  08/30/13 171 lb 11.8 oz (77.9 kg)  08/19/13 166 lb (75.297 kg)     Hospital Course: This patient is an 80 year old female who was admitted through the emergency room after presenting with shortness of breath and edema in her extremities. She was admitted with a diagnosis of possible pneumonia due to new retrocardiac opacity possibly consistent with acute infection. She was treated with cefepime and vancomycin. She had no fever. She was found to have a staph UTI on urine culture. She was also treated for acute on chronic diastolic heart failure with intravenous Lasix. She gradually had improvement in her marked swelling in the arms and legs. Renal function has remained stable with a BUN and creatinine in the 70 and 3 range. She has been treated with potassium supplementation. Her MGUS has remained stable with a hemoglobin in the 10 range. Her atrial fibrillation has been well controlled with diltiazem and metoprolol. She is not on anticoagulation because of GI bleeding.  Her condition has gradually improved she is able to get up in the chair. She will require continued supplemental oxygen for pulmonary hypertension. She will require an indwelling Foley for retention. She'll be discharged back to  her assisted living facility. Hospice will continue to assist with her care.  She has completed her course of intravenous antibiotic therapy. Routine medications have been continued. Diuretic therapy has been modified back to torsemide 40 mg twice a day.    Discharge Instructions     Medication List         acetaminophen 325 MG tablet  Commonly known as:  TYLENOL  Take 650 mg by mouth every 6 (six) hours as needed for mild pain, fever or headache.     albuterol 108 (90 BASE) MCG/ACT inhaler  Commonly known as:  PROVENTIL HFA;VENTOLIN HFA  Inhale 2 puffs into the lungs every 4 (four) hours as needed for wheezing or shortness of breath.     citalopram 20 MG tablet  Commonly known as:  CELEXA  Take 20 mg by mouth daily.     clonazePAM 0.5 MG tablet  Commonly known as:  KLONOPIN  Take 0.5 mg by mouth 3 (three) times daily as needed for anxiety.     cyanocobalamin 1000 MCG/ML injection  Commonly known as:  (VITAMIN B-12)  Inject 1,000 mcg into the muscle every 30 (thirty) days.     diltiazem 60 MG tablet  Commonly known as:  CARDIZEM  Take 1 tablet (60 mg total) by mouth every 12 (twelve) hours.     ferrous sulfate 325 (65 FE) MG tablet  Take 325 mg by mouth daily with breakfast.     fluocinonide cream 0.05 %  Commonly known as:  LIDEX  Apply 1 application topically daily. Apply sparingly to itchy areas on trunk & extremeties  insulin glargine 100 UNIT/ML injection  Commonly known as:  LANTUS  Inject 0.06 mLs (6 Units total) into the skin at bedtime.     metoprolol tartrate 25 MG tablet  Commonly known as:  LOPRESSOR  Take 1 tablet (25 mg total) by mouth 2 (two) times daily.     morphine 20 MG/ML concentrated solution  Commonly known as:  ROXANOL  Take by mouth every 2 (two) hours as needed for shortness of breath.     pantoprazole 40 MG tablet  Commonly known as:  PROTONIX  Take 40 mg by mouth daily.     polyethylene glycol packet  Commonly known as:   MIRALAX / GLYCOLAX  Take 17 g by mouth daily as needed for mild constipation.     potassium chloride 10 MEQ tablet  Commonly known as:  K-DUR,KLOR-CON  Take 1 tablet (10 mEq total) by mouth 2 (two) times daily.     torsemide 20 MG tablet  Commonly known as:  DEMADEX  Take 2 tablets (40 mg total) by mouth 2 (two) times daily.     triamcinolone cream 0.1 %  Commonly known as:  KENALOG  Apply 1 application topically daily. Apply to lower extremities.         Yohannes Waibel 10/21/2013

## 2013-10-21 NOTE — Clinical Social Work Note (Signed)
Patient for dc back to [facility] where she was residing prior to admission- Spoke with rep at ALF and have faxed them all needed information- they will plan to transport around 12:30 today.   Eduard Clos, MSW, [place]

## 2013-11-27 DEATH — deceased

## 2014-01-25 ENCOUNTER — Other Ambulatory Visit (HOSPITAL_COMMUNITY): Payer: Self-pay | Admitting: Oncology

## 2015-04-14 IMAGING — CR DG CHEST 1V PORT
1 series · 1 of 1 positions shown · non-contrast
Comparison: 10/06/2012

CLINICAL DATA: Fever

PORTABLE CHEST - 1 VIEW

[portable]
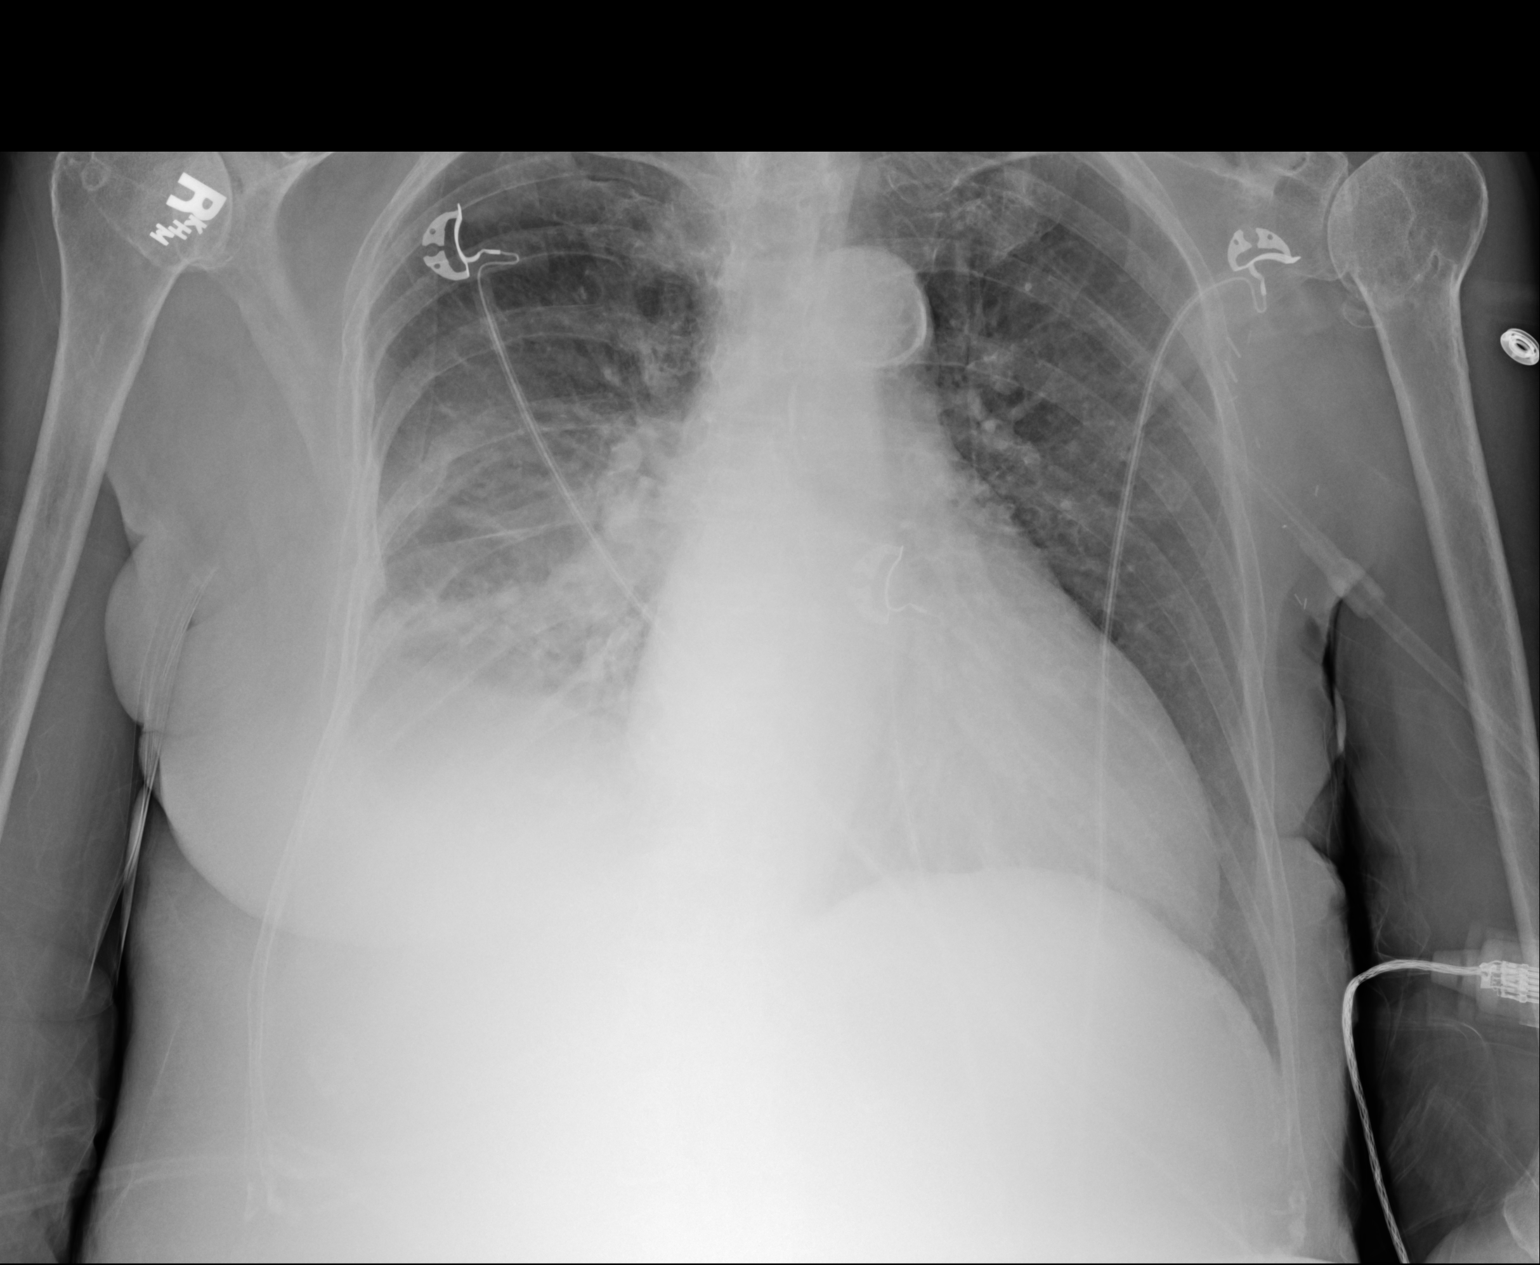

[1 of 1 positions shown; findings below may reference images not displayed]

FINDINGS: Progressive right lower lobe airspace disease and right
pleural effusion.

Cardiac enlargement with vascular congestion.  Left lung is clear.
IMPRESSION: Mild fluid overload

Progressive right lower lobe airspace disease and right effusion.
This could be due to heart failure or pneumonia.

## 2015-04-14 IMAGING — CR DG ABDOMEN 1V
1 series · 1 of 1 positions shown · non-contrast
Comparison: Lumbar spine radiographs 06/12/2011.  Abdominal CT
12/13/2010.

CLINICAL DATA: Vomiting.

ABDOMEN - 1 VIEW

[ap portable]
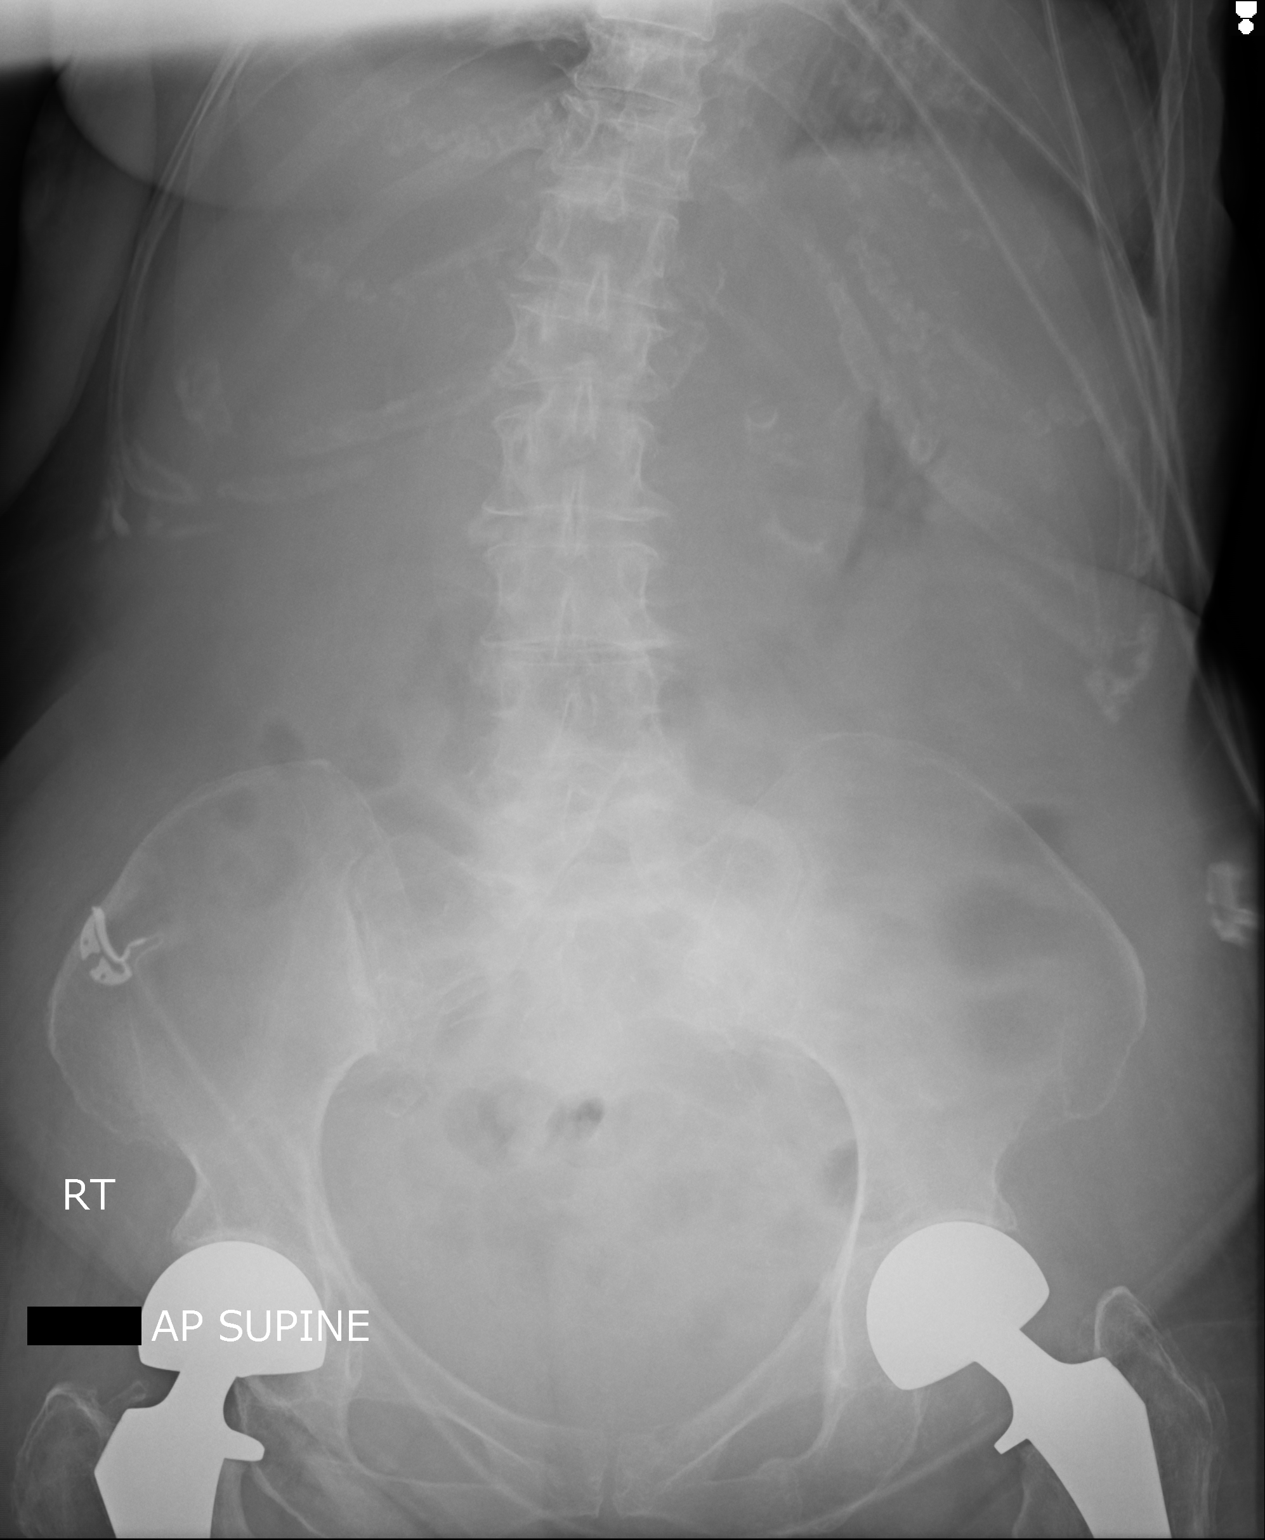

[1 of 1 positions shown; findings below may reference images not displayed]

FINDINGS: [id number] hours.  The abdomen is nearly gasless.  No distended
loops of bowel are identified.  There are diffuse vascular
calcifications.  Patient is status post bilateral hip arthroplasty.
There are mild degenerative changes throughout the lumbar spine.
IMPRESSION: No acute abdominal findings demonstrated radiographically.  The
abdomen is relatively gasless.

## 2015-04-17 IMAGING — US US RENAL
1 series · 14 of 25 positions shown · non-contrast
Comparison: None.

CLINICAL DATA: Acute urinary retention

RENAL/URINARY TRACT ULTRASOUND COMPLETE

[Series 1: us renal · 0.23mm/px · 14 of 28 slices shown]
[im 1/28]
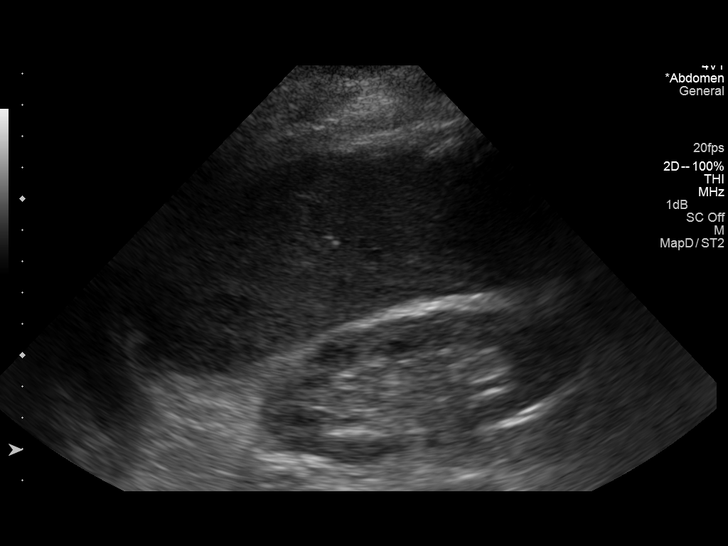
[im 3/28]
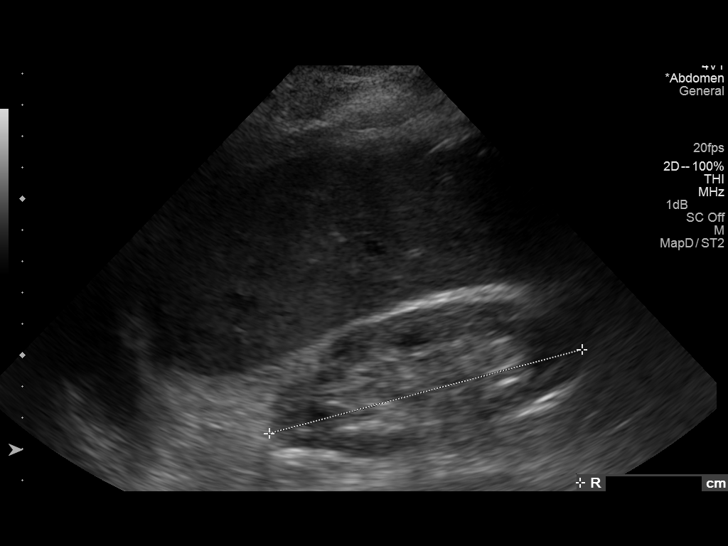
[im 5/28]
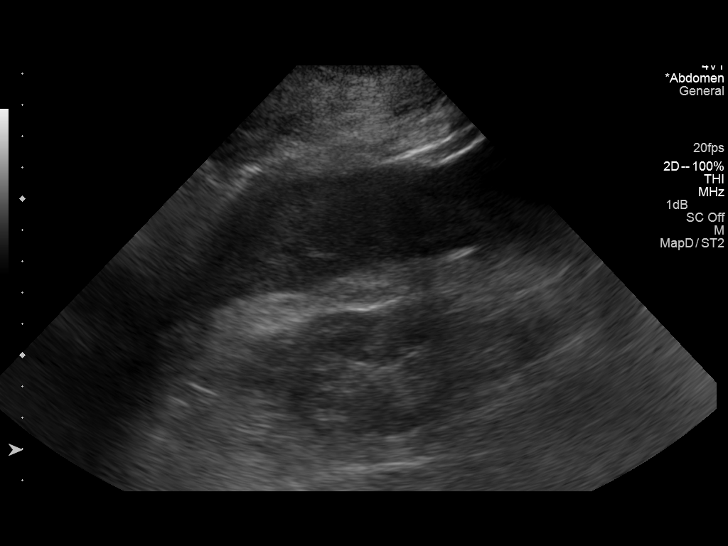
[im 7/28]
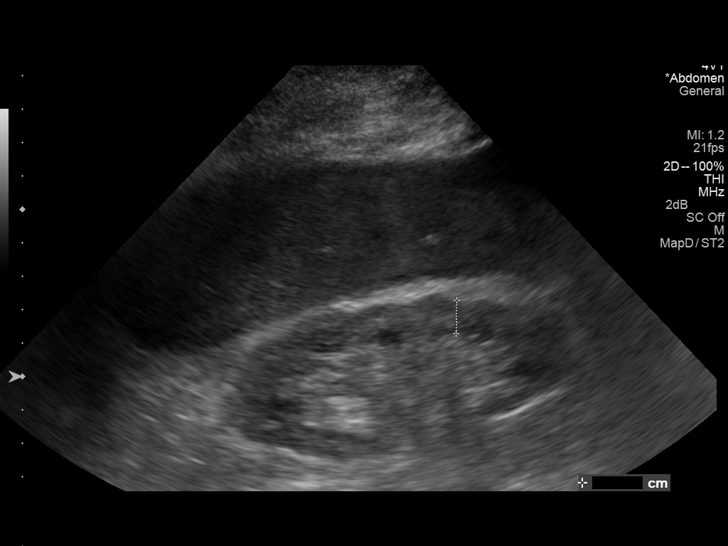
[im 10/28]
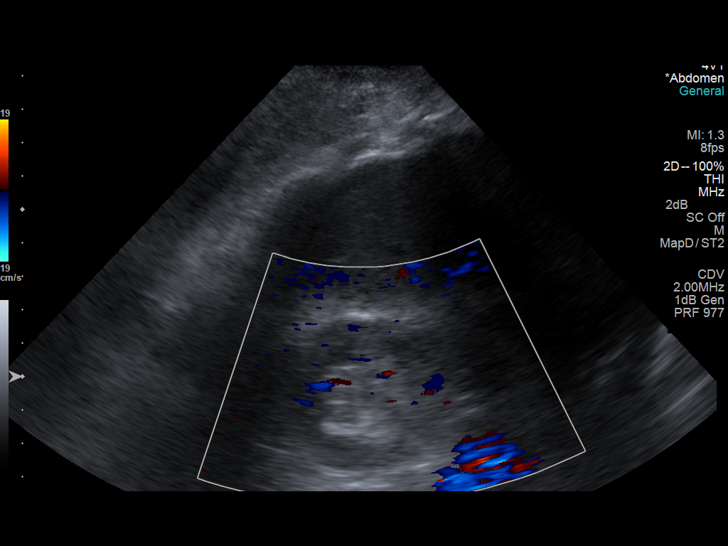
[im 11/28]
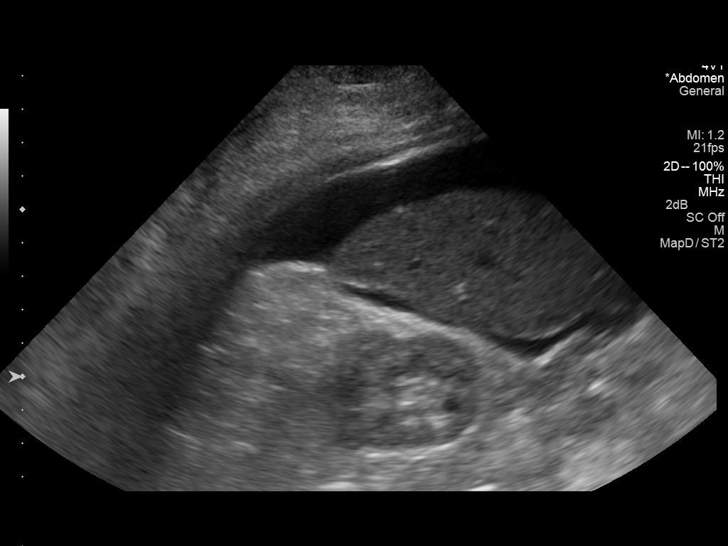
[im 13/28]
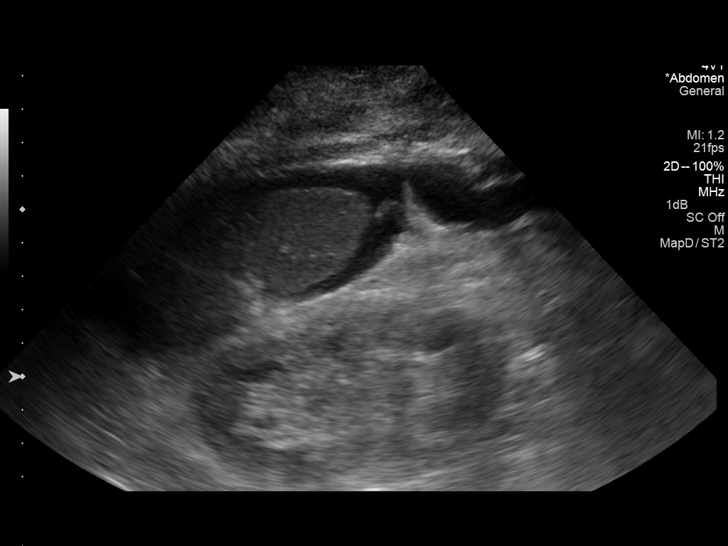
[im 15/28]
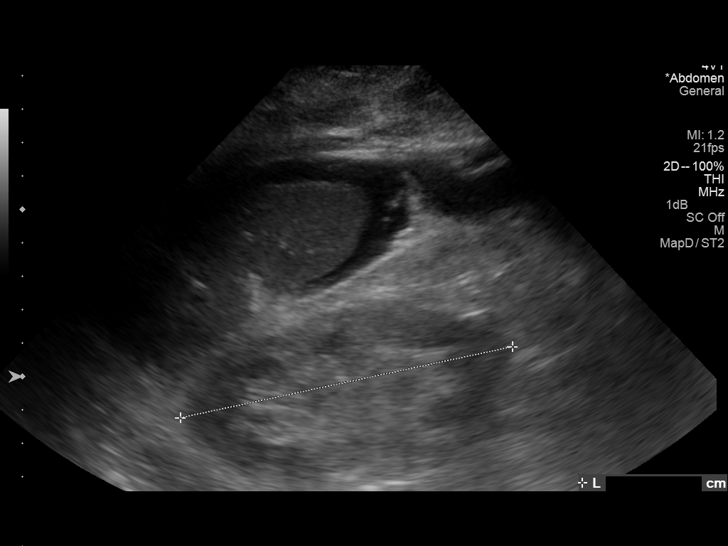
[im 17/28]
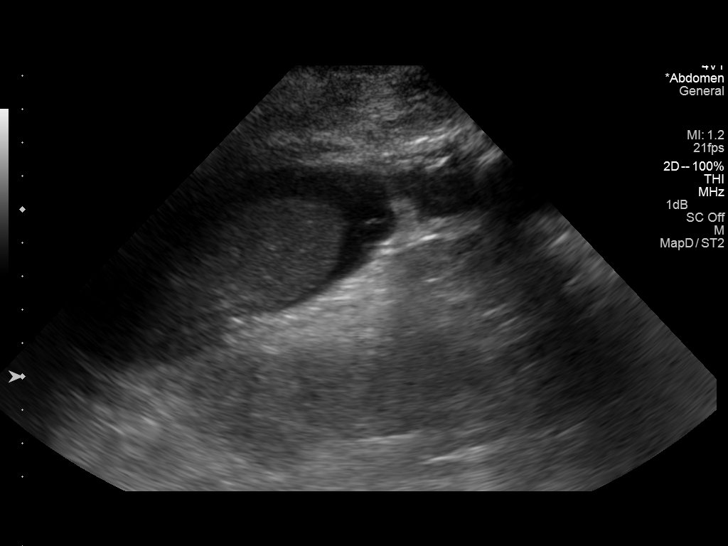
[im 19/28]
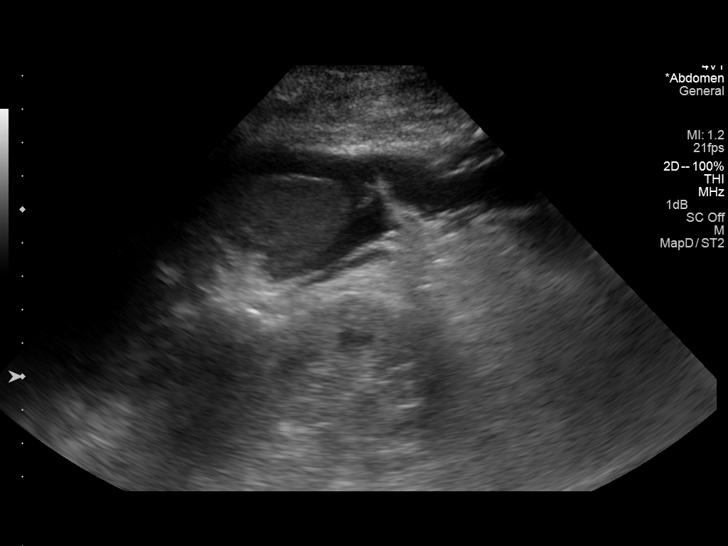
[im 21/28]
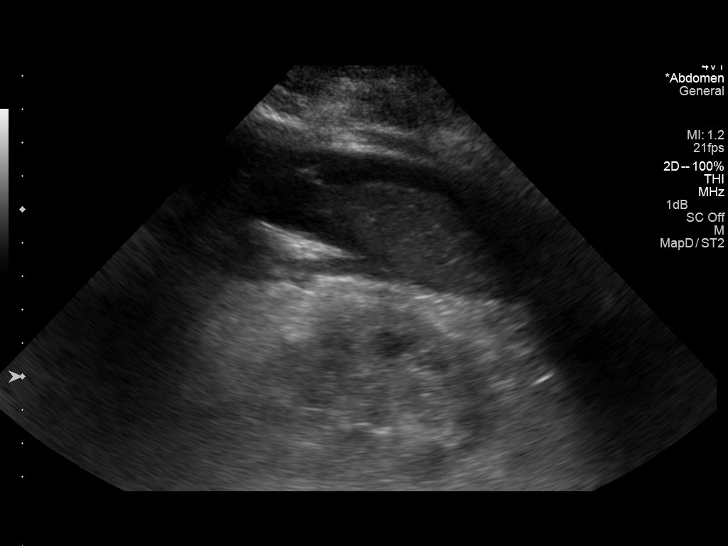
[im 23/28]
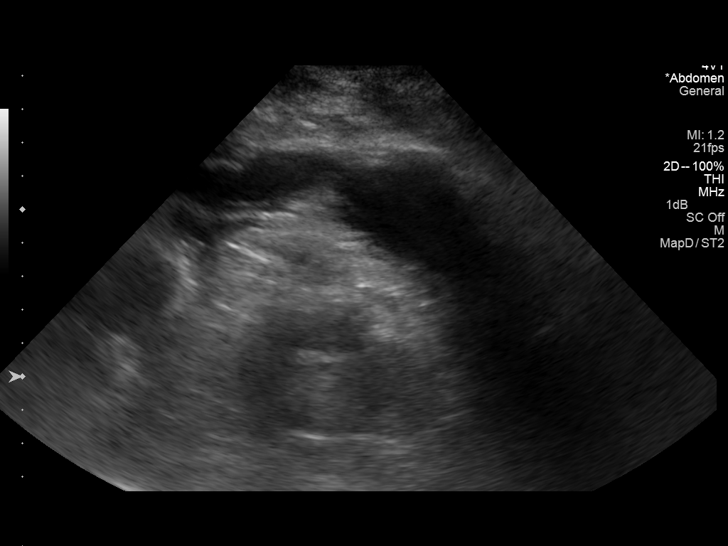
[im 25/28]
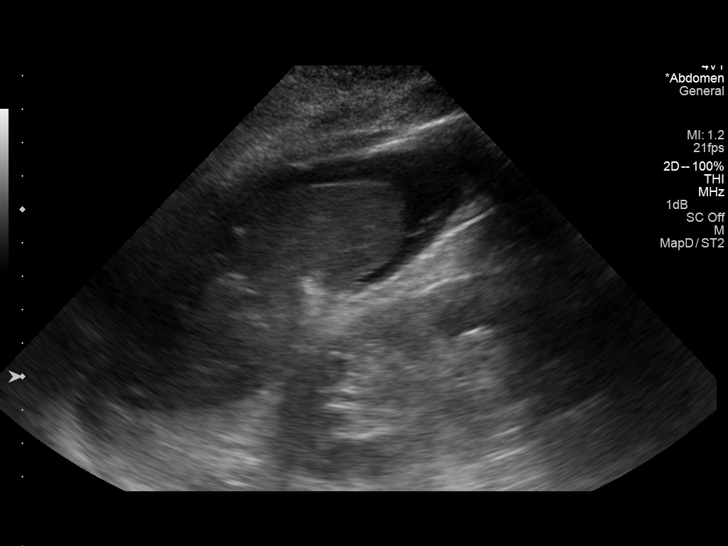
[im 28/28]
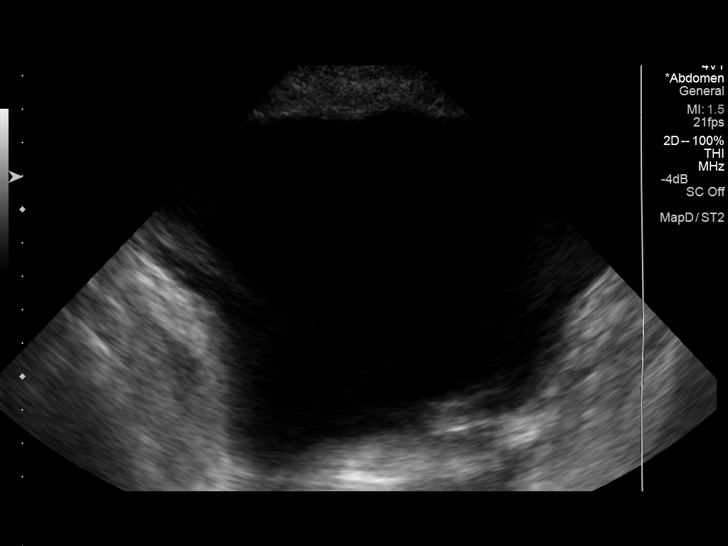

[14 of 25 positions shown; findings below may reference images not displayed]

FINDINGS: Right Kidney:  Measures 10.4 cm.  Increased parenchymal
echogenicity, suggesting medical renal disease.  No hydronephrosis.

Left Kidney:  Measures 10.2 cm.  Increased parenchymal
echogenicity, suggesting medical renal disease.  No hydronephrosis.

Bladder:  Decompressed by indwelling Foley catheter.

Additional comments:  Moderate abdominopelvic ascites.
IMPRESSION: No hydronephrosis.

Bladder decompressed by indwelling Foley catheter.

Moderate abdominopelvic ascites.

## 2015-12-04 IMAGING — CR DG CHEST 1V PORT
1 series · 1 of 1 positions shown · non-contrast
Comparison: DG CHEST 1V PORT dated 06/21/2013; DG ABD ACUTE W/CHEST
dated 06/10/2013; DG CHEST 1V PORT dated 12/10/2012

CLINICAL DATA: Shortness of breath, low oxygen saturation

EXAM:
PORTABLE CHEST - 1 VIEW

[portable]
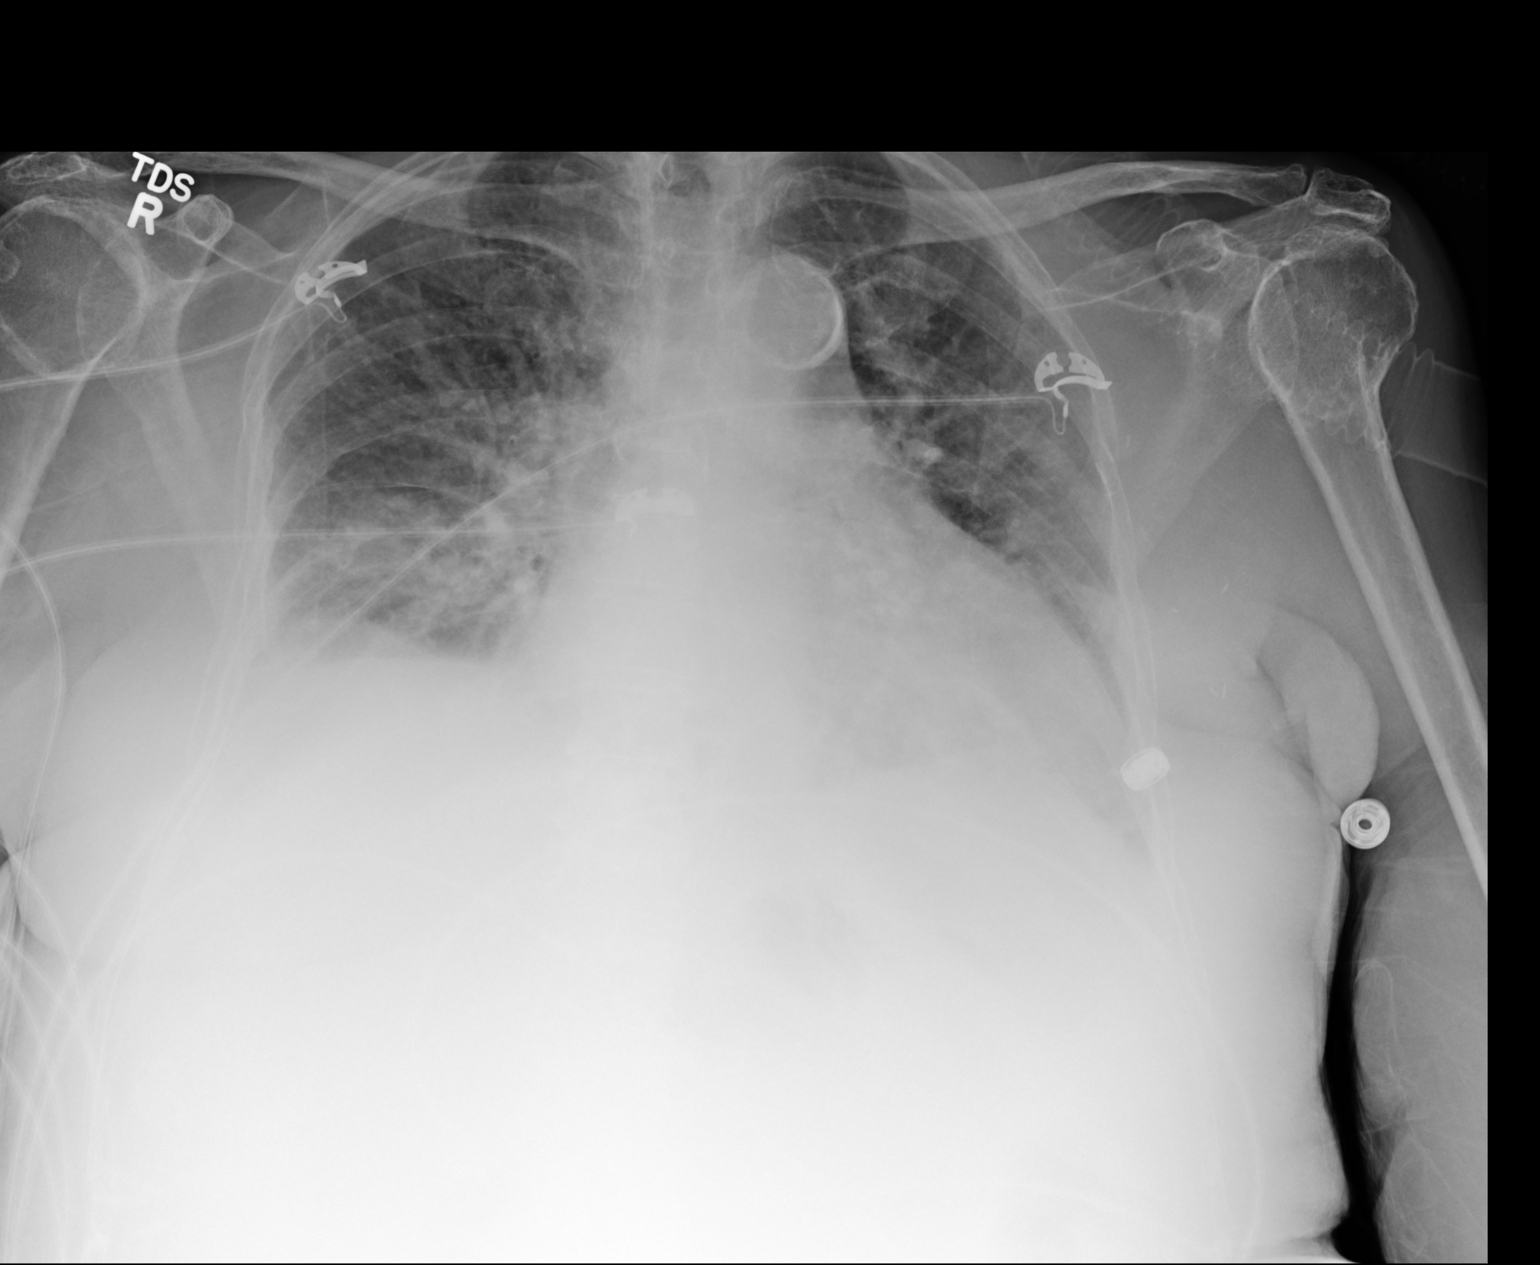

[1 of 1 positions shown; findings below may reference images not displayed]

FINDINGS: Mild cardiac enlargement and aortic calcifications stable. Limited
inspiratory effect. Significant vascular congestion. Peribronchial
cuffing with interstitial prominence. More hazy airspace opacity
right lower lobe.
IMPRESSION: Congestive heart failure with a mixture of diffuse interstitial and
right lower lobe alveolar edema.

## 2015-12-05 IMAGING — US US RENAL
1 series · 14 of 25 positions shown · non-contrast
Comparison: 02/11/2013.

CLINICAL DATA: 89-year-old female with abnormal renal function.
Initial encounter.

EXAM:
RENAL/URINARY TRACT ULTRASOUND COMPLETE

[Series 1: us renal · 0.27mm/px · 14 of 29 slices shown]
[im 1/29]
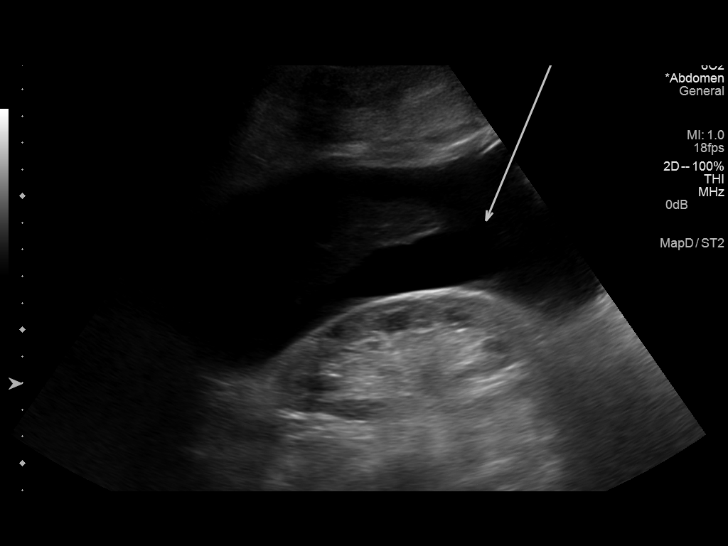
[im 3/29]
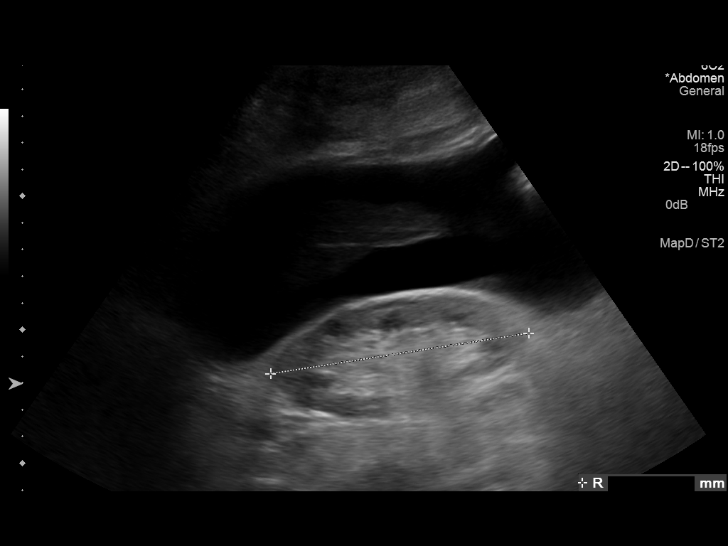
[im 5/29]
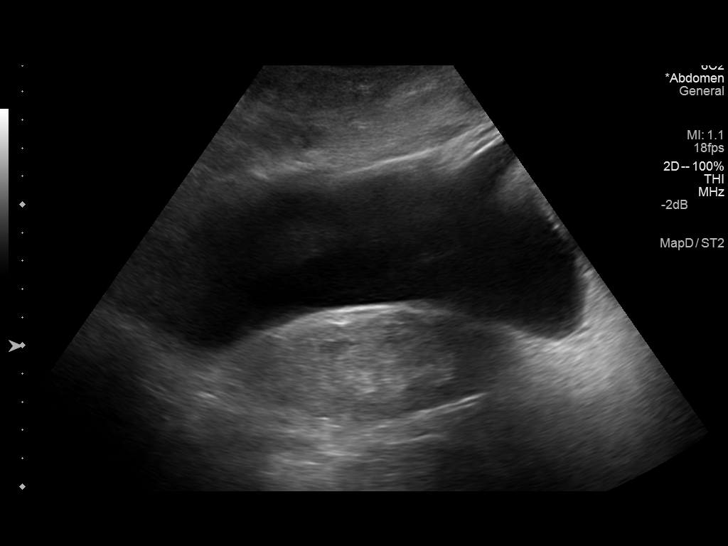
[im 8/29]
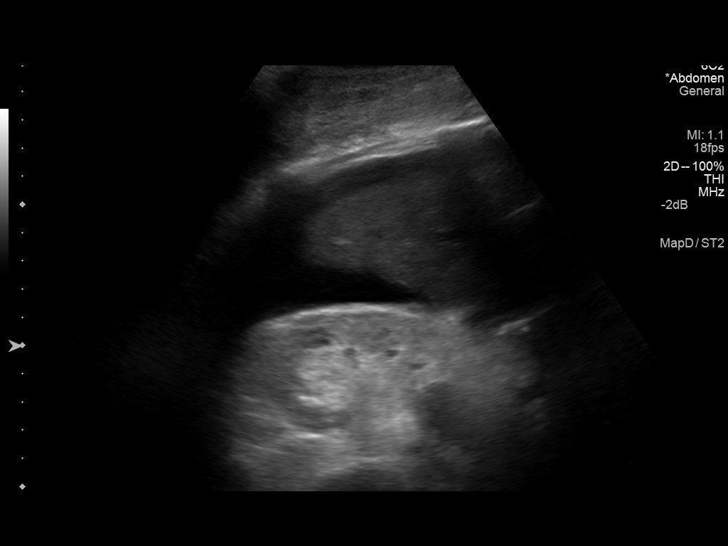
[im 10/29]
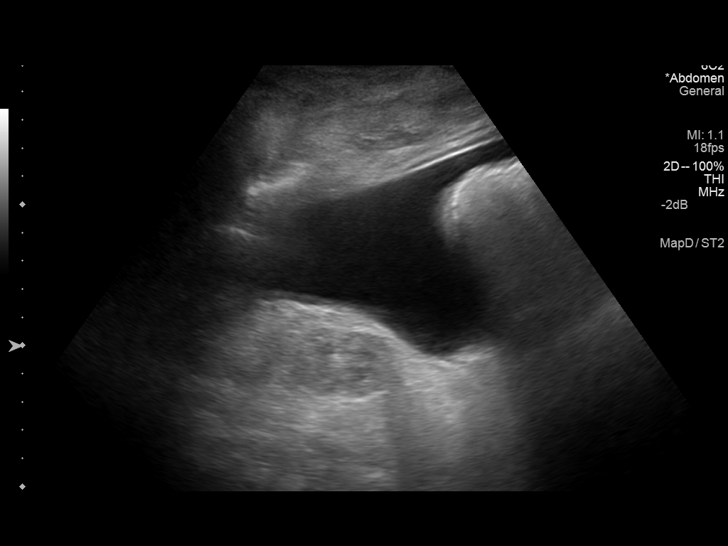
[im 11/29]
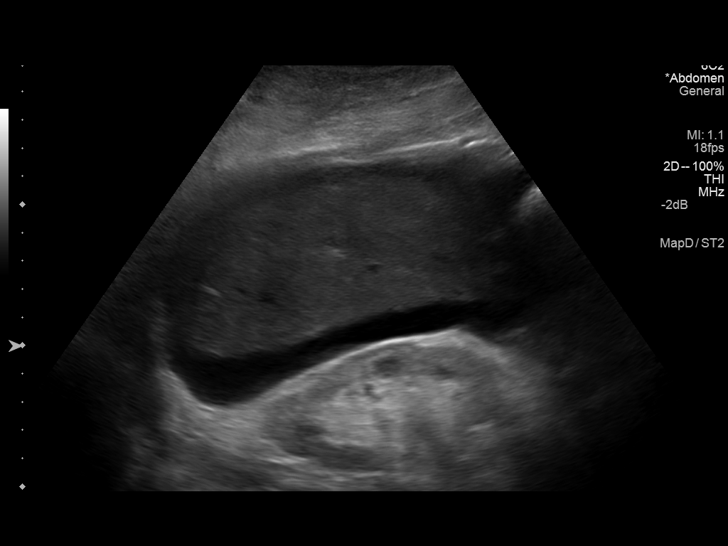
[im 13/29]
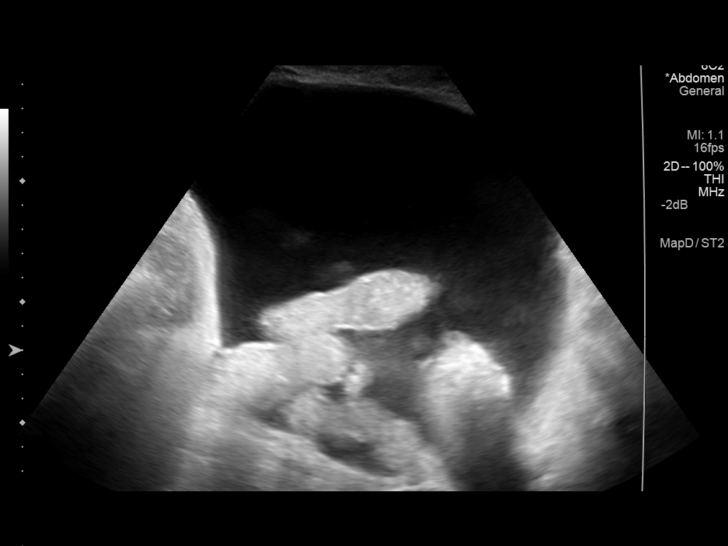
[im 16/29]
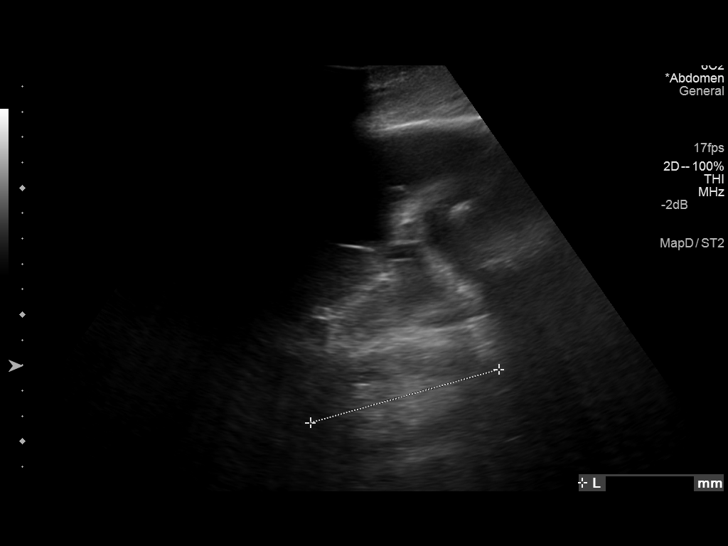
[im 18/29]
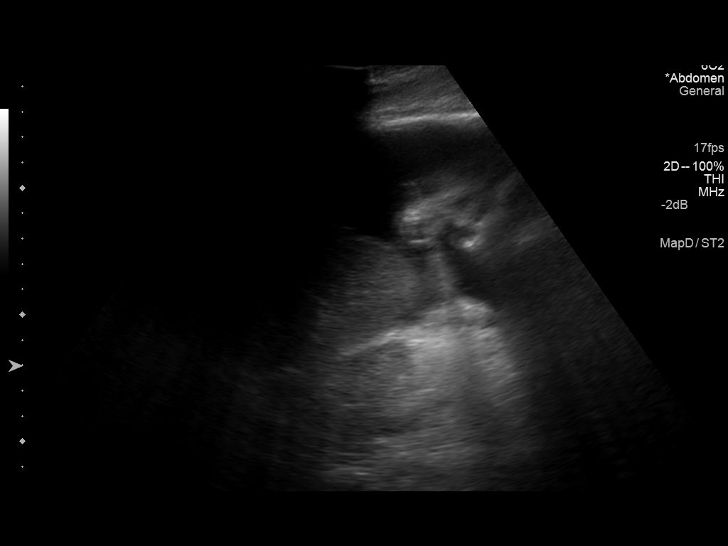
[im 19/29]
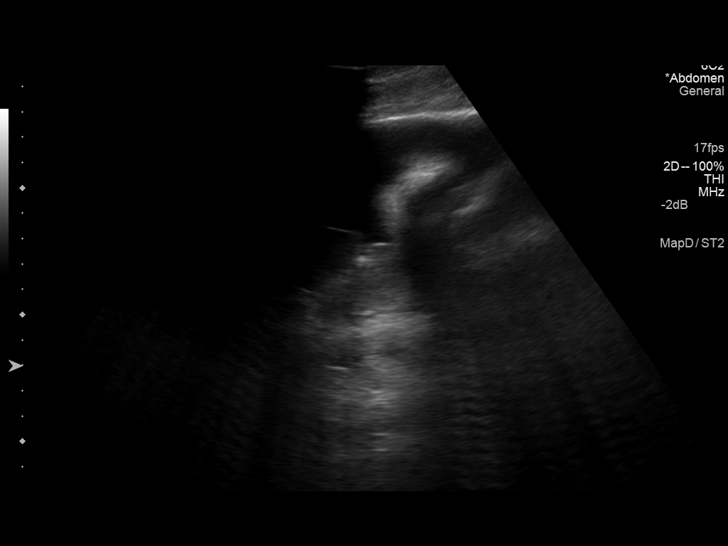
[im 22/29]
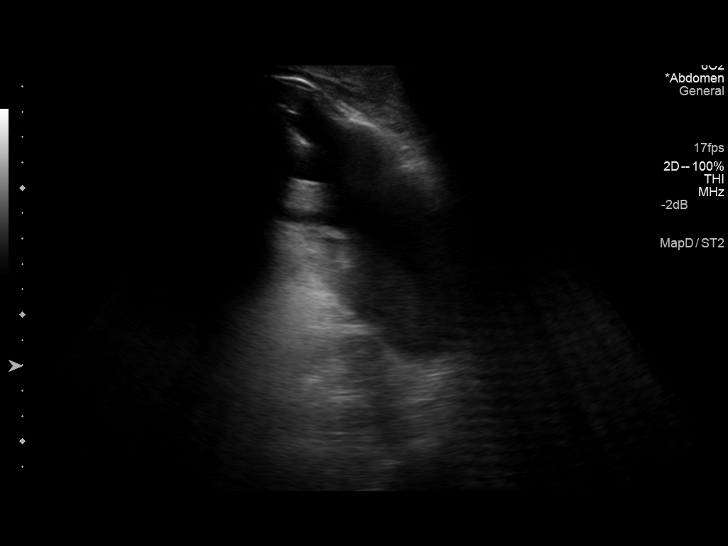
[im 24/29]
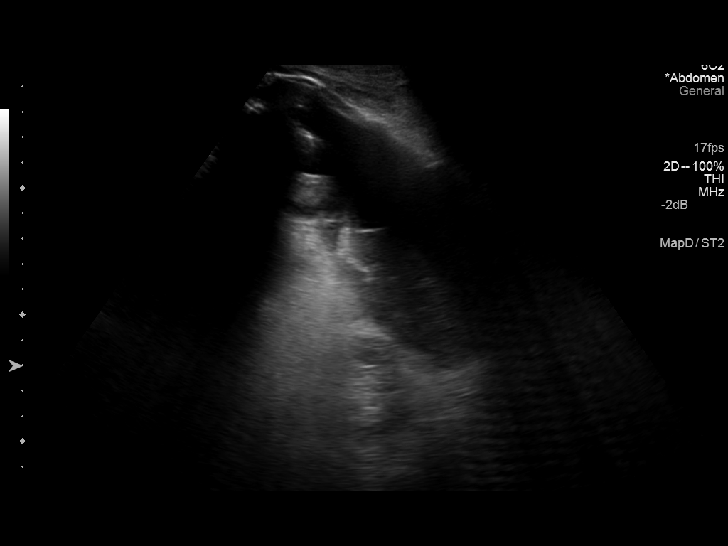
[im 26/29]
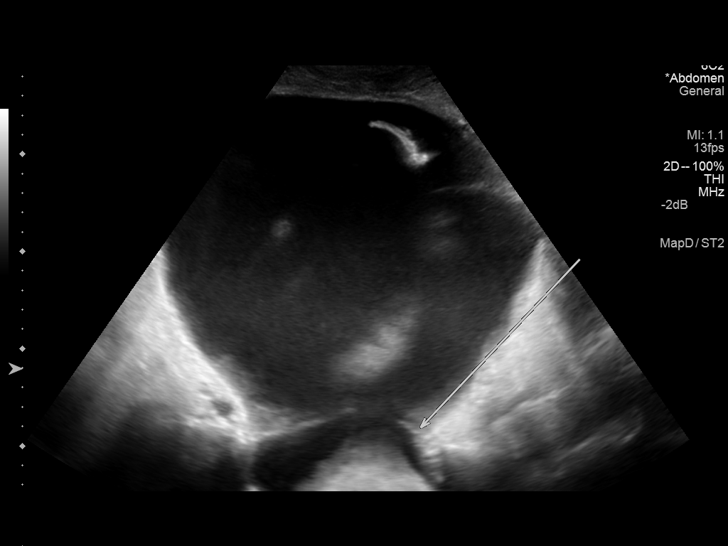
[im 29/29]
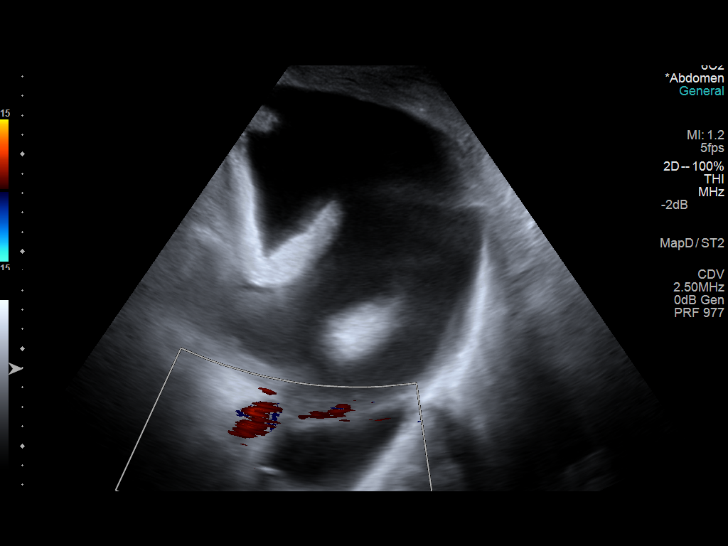

[14 of 25 positions shown; findings below may reference images not displayed]

FINDINGS: Right Kidney:

Length: 9.8 cm (unchanged).. Chronic increased cortical
echogenicity, appears progressed. No hydronephrosis.

Left Kidney:

Not as well visualized today Length: approximately 7.7 cm
(previously 9.2 cm). Chronic increased echogenicity.. No
hydronephrosis.

Bladder:

Diminutive.

Other Findings: Moderate to large volume of ascites, increased since
[REDACTED].
IMPRESSION: 1. No acute renal findings.  Chronic medical renal disease.
2. Progressed ascites, now moderate to severe.
